# Patient Record
Sex: Female | Born: 1970 | Race: White | Hispanic: No | Marital: Single | Smoking: Current every day smoker
Health system: Southern US, Community
[De-identification: ages and names within clinical notes are randomized; demographics above are authoritative.]

## PROBLEM LIST (undated history)

## (undated) DIAGNOSIS — B20 Human immunodeficiency virus [HIV] disease: Secondary | ICD-10-CM

## (undated) DIAGNOSIS — IMO0002 Reserved for concepts with insufficient information to code with codable children: Secondary | ICD-10-CM

## (undated) DIAGNOSIS — R911 Solitary pulmonary nodule: Secondary | ICD-10-CM

## (undated) DIAGNOSIS — T7840XA Allergy, unspecified, initial encounter: Secondary | ICD-10-CM

## (undated) DIAGNOSIS — Z21 Asymptomatic human immunodeficiency virus [HIV] infection status: Secondary | ICD-10-CM

## (undated) DIAGNOSIS — F419 Anxiety disorder, unspecified: Secondary | ICD-10-CM

## (undated) DIAGNOSIS — R87619 Unspecified abnormal cytological findings in specimens from cervix uteri: Secondary | ICD-10-CM

## (undated) DIAGNOSIS — G44009 Cluster headache syndrome, unspecified, not intractable: Secondary | ICD-10-CM

## (undated) DIAGNOSIS — I10 Essential (primary) hypertension: Secondary | ICD-10-CM

## (undated) DIAGNOSIS — A630 Anogenital (venereal) warts: Secondary | ICD-10-CM

## (undated) DIAGNOSIS — D649 Anemia, unspecified: Secondary | ICD-10-CM

## (undated) HISTORY — DX: Anemia, unspecified: D64.9

## (undated) HISTORY — PX: KNEE ARTHROSCOPY: SUR90

## (undated) HISTORY — DX: Reserved for concepts with insufficient information to code with codable children: IMO0002

## (undated) HISTORY — DX: Anxiety disorder, unspecified: F41.9

## (undated) HISTORY — DX: Asymptomatic human immunodeficiency virus (hiv) infection status: Z21

## (undated) HISTORY — DX: Unspecified abnormal cytological findings in specimens from cervix uteri: R87.619

## (undated) HISTORY — PX: TUBAL LIGATION: SHX77

## (undated) HISTORY — DX: Allergy, unspecified, initial encounter: T78.40XA

## (undated) HISTORY — DX: Cluster headache syndrome, unspecified, not intractable: G44.009

## (undated) HISTORY — DX: Solitary pulmonary nodule: R91.1

## (undated) HISTORY — DX: Anogenital (venereal) warts: A63.0

## (undated) HISTORY — PX: ABDOMINAL HYSTERECTOMY: SHX81

## (undated) HISTORY — DX: Human immunodeficiency virus (HIV) disease: B20

---

## 2001-04-17 ENCOUNTER — Ambulatory Visit (HOSPITAL_COMMUNITY): Admission: RE | Admit: 2001-04-17 | Discharge: 2001-04-17 | Payer: Self-pay | Admitting: Obstetrics and Gynecology

## 2001-04-17 ENCOUNTER — Encounter: Payer: Self-pay | Admitting: Obstetrics and Gynecology

## 2001-07-03 ENCOUNTER — Ambulatory Visit (HOSPITAL_COMMUNITY): Admission: RE | Admit: 2001-07-03 | Discharge: 2001-07-03 | Payer: Self-pay | Admitting: Obstetrics and Gynecology

## 2001-07-27 ENCOUNTER — Encounter: Admission: RE | Admit: 2001-07-27 | Discharge: 2001-07-27 | Payer: Self-pay | Admitting: *Deleted

## 2001-08-17 ENCOUNTER — Encounter: Admission: RE | Admit: 2001-08-17 | Discharge: 2001-08-17 | Payer: Self-pay | Admitting: *Deleted

## 2001-08-23 ENCOUNTER — Encounter: Admission: RE | Admit: 2001-08-23 | Discharge: 2001-08-23 | Payer: Self-pay | Admitting: *Deleted

## 2001-08-30 ENCOUNTER — Ambulatory Visit (HOSPITAL_COMMUNITY): Admission: RE | Admit: 2001-08-30 | Discharge: 2001-08-30 | Payer: Self-pay | Admitting: *Deleted

## 2001-08-31 ENCOUNTER — Encounter: Admission: RE | Admit: 2001-08-31 | Discharge: 2001-08-31 | Payer: Self-pay | Admitting: *Deleted

## 2001-09-07 ENCOUNTER — Encounter: Admission: RE | Admit: 2001-09-07 | Discharge: 2001-09-07 | Payer: Self-pay | Admitting: *Deleted

## 2001-09-14 ENCOUNTER — Encounter: Admission: RE | Admit: 2001-09-14 | Discharge: 2001-09-14 | Payer: Self-pay | Admitting: *Deleted

## 2001-09-28 ENCOUNTER — Encounter (HOSPITAL_COMMUNITY): Admission: RE | Admit: 2001-09-28 | Discharge: 2001-10-01 | Payer: Self-pay | Admitting: *Deleted

## 2001-09-28 ENCOUNTER — Encounter: Admission: RE | Admit: 2001-09-28 | Discharge: 2001-09-28 | Payer: Self-pay | Admitting: *Deleted

## 2001-09-30 ENCOUNTER — Encounter (INDEPENDENT_AMBULATORY_CARE_PROVIDER_SITE_OTHER): Payer: Self-pay | Admitting: Specialist

## 2001-09-30 ENCOUNTER — Inpatient Hospital Stay (HOSPITAL_COMMUNITY): Admission: AD | Admit: 2001-09-30 | Discharge: 2001-10-03 | Payer: Self-pay | Admitting: Obstetrics and Gynecology

## 2001-10-17 ENCOUNTER — Inpatient Hospital Stay (HOSPITAL_COMMUNITY): Admission: AD | Admit: 2001-10-17 | Discharge: 2001-10-17 | Payer: Self-pay | Admitting: *Deleted

## 2003-03-20 ENCOUNTER — Encounter: Admission: RE | Admit: 2003-03-20 | Discharge: 2003-03-20 | Payer: Self-pay | Admitting: Infectious Diseases

## 2003-03-20 ENCOUNTER — Encounter (INDEPENDENT_AMBULATORY_CARE_PROVIDER_SITE_OTHER): Payer: Self-pay | Admitting: *Deleted

## 2003-03-20 ENCOUNTER — Ambulatory Visit (HOSPITAL_COMMUNITY): Admission: RE | Admit: 2003-03-20 | Discharge: 2003-03-20 | Payer: Self-pay | Admitting: Infectious Diseases

## 2003-03-20 ENCOUNTER — Encounter: Payer: Self-pay | Admitting: Infectious Diseases

## 2003-03-20 LAB — CONVERTED CEMR LAB
CD4 Count: 380 microliters
CD4 T Cell Abs: 380

## 2003-04-03 ENCOUNTER — Encounter: Admission: RE | Admit: 2003-04-03 | Discharge: 2003-04-03 | Payer: Self-pay | Admitting: Infectious Diseases

## 2003-05-29 ENCOUNTER — Encounter: Payer: Self-pay | Admitting: Infectious Diseases

## 2003-05-29 ENCOUNTER — Ambulatory Visit (HOSPITAL_COMMUNITY): Admission: RE | Admit: 2003-05-29 | Discharge: 2003-05-29 | Payer: Self-pay | Admitting: Infectious Diseases

## 2003-05-29 ENCOUNTER — Encounter: Admission: RE | Admit: 2003-05-29 | Discharge: 2003-05-29 | Payer: Self-pay | Admitting: Infectious Diseases

## 2003-06-19 ENCOUNTER — Encounter: Admission: RE | Admit: 2003-06-19 | Discharge: 2003-06-19 | Payer: Self-pay | Admitting: Infectious Diseases

## 2003-06-19 ENCOUNTER — Ambulatory Visit (HOSPITAL_COMMUNITY): Admission: RE | Admit: 2003-06-19 | Discharge: 2003-06-19 | Payer: Self-pay | Admitting: Infectious Diseases

## 2003-06-19 ENCOUNTER — Encounter (INDEPENDENT_AMBULATORY_CARE_PROVIDER_SITE_OTHER): Payer: Self-pay | Admitting: *Deleted

## 2003-10-22 ENCOUNTER — Encounter: Admission: RE | Admit: 2003-10-22 | Discharge: 2003-10-22 | Payer: Self-pay | Admitting: Infectious Diseases

## 2004-01-13 ENCOUNTER — Encounter: Admission: RE | Admit: 2004-01-13 | Discharge: 2004-01-13 | Payer: Self-pay | Admitting: Infectious Diseases

## 2004-04-03 ENCOUNTER — Ambulatory Visit: Payer: Self-pay | Admitting: Infectious Diseases

## 2004-05-22 ENCOUNTER — Ambulatory Visit: Payer: Self-pay | Admitting: Infectious Diseases

## 2004-05-22 ENCOUNTER — Ambulatory Visit (HOSPITAL_COMMUNITY): Admission: RE | Admit: 2004-05-22 | Discharge: 2004-05-22 | Payer: Self-pay | Admitting: Infectious Diseases

## 2004-08-19 ENCOUNTER — Ambulatory Visit: Payer: Self-pay | Admitting: Infectious Diseases

## 2004-08-19 ENCOUNTER — Ambulatory Visit (HOSPITAL_COMMUNITY): Admission: RE | Admit: 2004-08-19 | Discharge: 2004-08-19 | Payer: Self-pay | Admitting: Infectious Diseases

## 2004-11-18 ENCOUNTER — Ambulatory Visit: Payer: Self-pay | Admitting: Infectious Diseases

## 2005-01-13 ENCOUNTER — Ambulatory Visit: Payer: Self-pay | Admitting: Infectious Diseases

## 2005-01-13 ENCOUNTER — Ambulatory Visit (HOSPITAL_COMMUNITY): Admission: RE | Admit: 2005-01-13 | Discharge: 2005-01-13 | Payer: Self-pay | Admitting: Infectious Diseases

## 2005-02-19 ENCOUNTER — Encounter (INDEPENDENT_AMBULATORY_CARE_PROVIDER_SITE_OTHER): Payer: Self-pay | Admitting: *Deleted

## 2005-02-19 ENCOUNTER — Ambulatory Visit: Payer: Self-pay | Admitting: Family Medicine

## 2005-04-12 ENCOUNTER — Ambulatory Visit: Payer: Self-pay | Admitting: Infectious Diseases

## 2005-04-12 ENCOUNTER — Ambulatory Visit (HOSPITAL_COMMUNITY): Admission: RE | Admit: 2005-04-12 | Discharge: 2005-04-12 | Payer: Self-pay | Admitting: Infectious Diseases

## 2005-04-15 ENCOUNTER — Encounter (INDEPENDENT_AMBULATORY_CARE_PROVIDER_SITE_OTHER): Payer: Self-pay | Admitting: Specialist

## 2005-04-15 ENCOUNTER — Other Ambulatory Visit: Admission: RE | Admit: 2005-04-15 | Discharge: 2005-04-15 | Payer: Self-pay | Admitting: Family Medicine

## 2005-04-15 ENCOUNTER — Ambulatory Visit: Payer: Self-pay | Admitting: Family Medicine

## 2005-07-16 ENCOUNTER — Ambulatory Visit: Payer: Self-pay | Admitting: *Deleted

## 2005-07-16 ENCOUNTER — Other Ambulatory Visit: Admission: RE | Admit: 2005-07-16 | Discharge: 2005-07-16 | Payer: Self-pay | Admitting: Family Medicine

## 2005-07-26 ENCOUNTER — Ambulatory Visit: Payer: Self-pay | Admitting: Infectious Diseases

## 2005-08-06 ENCOUNTER — Ambulatory Visit: Payer: Self-pay | Admitting: Family Medicine

## 2005-08-20 ENCOUNTER — Ambulatory Visit (HOSPITAL_COMMUNITY): Admission: RE | Admit: 2005-08-20 | Discharge: 2005-08-20 | Payer: Self-pay | Admitting: Family Medicine

## 2005-09-24 ENCOUNTER — Ambulatory Visit: Payer: Self-pay | Admitting: Gynecology

## 2005-11-10 ENCOUNTER — Encounter (INDEPENDENT_AMBULATORY_CARE_PROVIDER_SITE_OTHER): Payer: Self-pay | Admitting: *Deleted

## 2005-11-10 ENCOUNTER — Encounter: Admission: RE | Admit: 2005-11-10 | Discharge: 2005-11-10 | Payer: Self-pay | Admitting: Infectious Diseases

## 2005-11-10 ENCOUNTER — Ambulatory Visit: Payer: Self-pay | Admitting: Infectious Diseases

## 2005-11-10 LAB — CONVERTED CEMR LAB
CD4 Count: 490 microliters
HIV 1 RNA Quant: 399 copies/mL

## 2005-12-02 ENCOUNTER — Ambulatory Visit: Payer: Self-pay | Admitting: Family Medicine

## 2005-12-20 ENCOUNTER — Encounter (INDEPENDENT_AMBULATORY_CARE_PROVIDER_SITE_OTHER): Payer: Self-pay | Admitting: *Deleted

## 2005-12-20 ENCOUNTER — Inpatient Hospital Stay (HOSPITAL_COMMUNITY): Admission: RE | Admit: 2005-12-20 | Discharge: 2005-12-22 | Payer: Self-pay | Admitting: Family Medicine

## 2005-12-20 ENCOUNTER — Ambulatory Visit: Payer: Self-pay | Admitting: Family Medicine

## 2005-12-20 DIAGNOSIS — Z90710 Acquired absence of both cervix and uterus: Secondary | ICD-10-CM | POA: Insufficient documentation

## 2006-01-13 ENCOUNTER — Ambulatory Visit: Payer: Self-pay | Admitting: Family Medicine

## 2006-02-10 ENCOUNTER — Ambulatory Visit: Payer: Self-pay | Admitting: Family Medicine

## 2006-02-16 ENCOUNTER — Encounter (INDEPENDENT_AMBULATORY_CARE_PROVIDER_SITE_OTHER): Payer: Self-pay | Admitting: *Deleted

## 2006-02-16 ENCOUNTER — Encounter: Admission: RE | Admit: 2006-02-16 | Discharge: 2006-02-16 | Payer: Self-pay | Admitting: Infectious Diseases

## 2006-02-16 ENCOUNTER — Ambulatory Visit: Payer: Self-pay | Admitting: Infectious Diseases

## 2006-02-16 LAB — CONVERTED CEMR LAB: HIV 1 RNA Quant: 49 copies/mL

## 2006-04-04 ENCOUNTER — Ambulatory Visit: Payer: Self-pay | Admitting: Infectious Diseases

## 2006-06-27 ENCOUNTER — Ambulatory Visit: Payer: Self-pay | Admitting: Infectious Diseases

## 2006-06-27 ENCOUNTER — Encounter (INDEPENDENT_AMBULATORY_CARE_PROVIDER_SITE_OTHER): Payer: Self-pay | Admitting: *Deleted

## 2006-06-27 ENCOUNTER — Encounter: Admission: RE | Admit: 2006-06-27 | Discharge: 2006-06-27 | Payer: Self-pay | Admitting: Infectious Diseases

## 2006-06-27 DIAGNOSIS — F17200 Nicotine dependence, unspecified, uncomplicated: Secondary | ICD-10-CM | POA: Insufficient documentation

## 2006-06-27 DIAGNOSIS — J019 Acute sinusitis, unspecified: Secondary | ICD-10-CM | POA: Insufficient documentation

## 2006-06-27 DIAGNOSIS — J329 Chronic sinusitis, unspecified: Secondary | ICD-10-CM | POA: Insufficient documentation

## 2006-06-27 DIAGNOSIS — B20 Human immunodeficiency virus [HIV] disease: Secondary | ICD-10-CM | POA: Insufficient documentation

## 2006-06-27 DIAGNOSIS — F411 Generalized anxiety disorder: Secondary | ICD-10-CM | POA: Insufficient documentation

## 2006-06-27 LAB — CONVERTED CEMR LAB
ALT: 17 units/L (ref 0–35)
AST: 18 units/L (ref 0–37)
Albumin: 4.7 g/dL (ref 3.5–5.2)
BUN: 7 mg/dL (ref 6–23)
Calcium: 9 mg/dL (ref 8.4–10.5)
Chloride: 108 meq/L (ref 96–112)
HIV 1 RNA Quant: 86 copies/mL — ABNORMAL HIGH (ref <50)
HIV-1 RNA Quant, Log: 1.93 — ABNORMAL HIGH (ref <1.70)
Hemoglobin: 12.7 g/dL (ref 12.0–15.0)
LDL Cholesterol: 114 mg/dL — ABNORMAL HIGH (ref 0–99)
Platelets: 217 10*3/uL (ref 150–400)
Potassium: 4 meq/L (ref 3.5–5.3)
RDW: 12.6 % (ref 11.5–14.0)
Sodium: 143 meq/L (ref 135–145)
Total Protein: 6.9 g/dL (ref 6.0–8.3)

## 2006-06-30 ENCOUNTER — Encounter: Payer: Self-pay | Admitting: Infectious Diseases

## 2006-07-26 ENCOUNTER — Encounter: Payer: Self-pay | Admitting: Infectious Diseases

## 2006-08-08 ENCOUNTER — Encounter (INDEPENDENT_AMBULATORY_CARE_PROVIDER_SITE_OTHER): Payer: Self-pay | Admitting: *Deleted

## 2006-08-08 LAB — CONVERTED CEMR LAB

## 2006-08-21 ENCOUNTER — Encounter (INDEPENDENT_AMBULATORY_CARE_PROVIDER_SITE_OTHER): Payer: Self-pay | Admitting: *Deleted

## 2006-09-26 ENCOUNTER — Telehealth: Payer: Self-pay | Admitting: Infectious Diseases

## 2006-10-03 ENCOUNTER — Telehealth: Payer: Self-pay | Admitting: Infectious Diseases

## 2006-10-24 ENCOUNTER — Ambulatory Visit: Payer: Self-pay | Admitting: Infectious Diseases

## 2006-10-24 ENCOUNTER — Encounter: Admission: RE | Admit: 2006-10-24 | Discharge: 2006-10-24 | Payer: Self-pay | Admitting: Infectious Diseases

## 2006-10-24 LAB — CONVERTED CEMR LAB
AST: 25 units/L (ref 0–37)
Albumin: 4.9 g/dL (ref 3.5–5.2)
Alkaline Phosphatase: 60 units/L (ref 39–117)
BUN: 14 mg/dL (ref 6–23)
Basophils Relative: 1 % (ref 0–1)
CD4 Count: 570 microliters
Eosinophils Absolute: 0.1 10*3/uL (ref 0.0–0.7)
Eosinophils Relative: 3 % (ref 0–5)
Glucose, Bld: 97 mg/dL (ref 70–99)
HCT: 39.1 % (ref 36.0–46.0)
Lymphs Abs: 1.4 10*3/uL (ref 0.7–3.3)
MCHC: 34.5 g/dL (ref 30.0–36.0)
MCV: 95.6 fL (ref 78.0–100.0)
Monocytes Relative: 7 % (ref 3–11)
Neutrophils Relative %: 54 % (ref 43–77)
Platelets: 143 10*3/uL — ABNORMAL LOW (ref 150–400)
Potassium: 3.9 meq/L (ref 3.5–5.3)
Sodium: 141 meq/L (ref 135–145)
Total Bilirubin: 0.4 mg/dL (ref 0.3–1.2)

## 2006-10-25 ENCOUNTER — Telehealth: Payer: Self-pay | Admitting: Infectious Diseases

## 2006-10-31 ENCOUNTER — Ambulatory Visit: Payer: Self-pay | Admitting: Infectious Diseases

## 2006-10-31 DIAGNOSIS — G4700 Insomnia, unspecified: Secondary | ICD-10-CM | POA: Insufficient documentation

## 2006-10-31 DIAGNOSIS — G43909 Migraine, unspecified, not intractable, without status migrainosus: Secondary | ICD-10-CM

## 2006-11-17 ENCOUNTER — Telehealth: Payer: Self-pay | Admitting: Infectious Diseases

## 2007-01-12 ENCOUNTER — Telehealth: Payer: Self-pay | Admitting: Infectious Diseases

## 2007-01-13 ENCOUNTER — Ambulatory Visit: Payer: Self-pay | Admitting: Gynecology

## 2007-04-03 ENCOUNTER — Ambulatory Visit: Payer: Self-pay | Admitting: Infectious Diseases

## 2007-04-03 ENCOUNTER — Encounter: Admission: RE | Admit: 2007-04-03 | Discharge: 2007-04-03 | Payer: Self-pay | Admitting: Infectious Diseases

## 2007-04-03 DIAGNOSIS — B351 Tinea unguium: Secondary | ICD-10-CM | POA: Insufficient documentation

## 2007-04-03 LAB — CONVERTED CEMR LAB
AST: 17 units/L (ref 0–37)
Albumin: 4.5 g/dL (ref 3.5–5.2)
Alkaline Phosphatase: 50 units/L (ref 39–117)
BUN: 9 mg/dL (ref 6–23)
Creatinine, Ser: 0.78 mg/dL (ref 0.40–1.20)
Glucose, Bld: 87 mg/dL (ref 70–99)
HCT: 38.1 % (ref 36.0–46.0)
HDL: 37 mg/dL — ABNORMAL LOW (ref >39)
Hemoglobin: 13 g/dL (ref 12.0–15.0)
LDL Cholesterol: 121 mg/dL — ABNORMAL HIGH (ref 0–99)
MCHC: 34.1 g/dL (ref 30.0–36.0)
MCV: 97.7 fL (ref 78.0–100.0)
Potassium: 3.8 meq/L (ref 3.5–5.3)
RBC: 3.9 M/uL (ref 3.87–5.11)
RDW: 12.8 % (ref 11.5–14.0)
Total CHOL/HDL Ratio: 5
Triglycerides: 142 mg/dL (ref <150)

## 2007-05-03 ENCOUNTER — Telehealth: Payer: Self-pay | Admitting: Infectious Diseases

## 2007-06-13 ENCOUNTER — Encounter: Payer: Self-pay | Admitting: Infectious Diseases

## 2007-06-22 ENCOUNTER — Telehealth: Payer: Self-pay | Admitting: Infectious Diseases

## 2007-07-05 ENCOUNTER — Telehealth: Payer: Self-pay | Admitting: Infectious Diseases

## 2007-08-04 ENCOUNTER — Ambulatory Visit: Payer: Self-pay | Admitting: Internal Medicine

## 2007-08-04 ENCOUNTER — Encounter: Admission: RE | Admit: 2007-08-04 | Discharge: 2007-08-04 | Payer: Self-pay | Admitting: Internal Medicine

## 2007-08-04 ENCOUNTER — Encounter: Payer: Self-pay | Admitting: Infectious Diseases

## 2007-08-04 LAB — CONVERTED CEMR LAB
AST: 21 units/L (ref 0–37)
Alkaline Phosphatase: 47 units/L (ref 39–117)
BUN: 6 mg/dL (ref 6–23)
Basophils Absolute: 0 10*3/uL (ref 0.0–0.1)
Basophils Relative: 1 % (ref 0–1)
Creatinine, Ser: 0.59 mg/dL (ref 0.40–1.20)
Eosinophils Absolute: 0.1 10*3/uL (ref 0.0–0.7)
HIV 1 RNA Quant: <50 {copies}/mL
HIV-1 RNA Quant, Log: <1.7
Hemoglobin: 13.2 g/dL (ref 12.0–15.0)
MCHC: 34.5 g/dL (ref 30.0–36.0)
MCV: 97.5 fL (ref 78.0–100.0)
Monocytes Absolute: 0.3 10*3/uL (ref 0.1–1.0)
Monocytes Relative: 6 % (ref 3–12)
Neutrophils Relative %: 58 % (ref 43–77)
RBC: 3.93 M/uL (ref 3.87–5.11)
RDW: 12.9 % (ref 11.5–15.5)

## 2007-08-23 ENCOUNTER — Ambulatory Visit: Payer: Self-pay | Admitting: Infectious Diseases

## 2007-09-29 ENCOUNTER — Telehealth: Payer: Self-pay | Admitting: Infectious Diseases

## 2007-10-16 ENCOUNTER — Telehealth: Payer: Self-pay | Admitting: Infectious Diseases

## 2007-10-18 ENCOUNTER — Ambulatory Visit: Payer: Self-pay | Admitting: Internal Medicine

## 2007-10-18 DIAGNOSIS — L02818 Cutaneous abscess of other sites: Secondary | ICD-10-CM

## 2007-10-18 DIAGNOSIS — L282 Other prurigo: Secondary | ICD-10-CM | POA: Insufficient documentation

## 2007-10-18 DIAGNOSIS — L03818 Cellulitis of other sites: Secondary | ICD-10-CM

## 2007-10-18 DIAGNOSIS — R21 Rash and other nonspecific skin eruption: Secondary | ICD-10-CM

## 2007-10-18 DIAGNOSIS — L659 Nonscarring hair loss, unspecified: Secondary | ICD-10-CM | POA: Insufficient documentation

## 2007-10-25 ENCOUNTER — Telehealth: Payer: Self-pay | Admitting: Internal Medicine

## 2007-11-28 ENCOUNTER — Ambulatory Visit: Payer: Self-pay | Admitting: Infectious Diseases

## 2007-11-28 ENCOUNTER — Encounter: Admission: RE | Admit: 2007-11-28 | Discharge: 2007-11-28 | Payer: Self-pay | Admitting: Infectious Diseases

## 2007-11-28 LAB — CONVERTED CEMR LAB
CO2: 23 meq/L (ref 19–32)
Eosinophils Relative: 3 % (ref 0–5)
Glucose, Bld: 96 mg/dL (ref 70–99)
HCT: 38.9 % (ref 36.0–46.0)
HIV 1 RNA Quant: 50 copies/mL — ABNORMAL HIGH (ref <50)
HIV-1 RNA Quant, Log: 1.7 — ABNORMAL HIGH (ref <1.70)
Hemoglobin: 13.2 g/dL (ref 12.0–15.0)
Lymphocytes Relative: 37 % (ref 12–46)
Lymphs Abs: 2.4 10*3/uL (ref 0.7–4.0)
Monocytes Absolute: 0.3 10*3/uL (ref 0.1–1.0)
Monocytes Relative: 5 % (ref 3–12)
Platelets: 208 10*3/uL (ref 150–400)
RBC: 4.06 M/uL (ref 3.87–5.11)
Sodium: 141 meq/L (ref 135–145)
Total Bilirubin: 0.2 mg/dL — ABNORMAL LOW (ref 0.3–1.2)
Total Protein: 6.8 g/dL (ref 6.0–8.3)
WBC: 6.3 10*3/uL (ref 4.0–10.5)

## 2007-12-13 ENCOUNTER — Ambulatory Visit: Payer: Self-pay | Admitting: Infectious Diseases

## 2007-12-14 ENCOUNTER — Telehealth: Payer: Self-pay | Admitting: Infectious Diseases

## 2007-12-19 ENCOUNTER — Telehealth: Payer: Self-pay | Admitting: Infectious Diseases

## 2008-02-20 ENCOUNTER — Telehealth: Payer: Self-pay

## 2008-02-21 ENCOUNTER — Ambulatory Visit: Payer: Self-pay | Admitting: Infectious Diseases

## 2008-02-21 ENCOUNTER — Encounter: Payer: Self-pay | Admitting: Infectious Diseases

## 2008-02-21 DIAGNOSIS — M25569 Pain in unspecified knee: Secondary | ICD-10-CM

## 2008-02-23 ENCOUNTER — Ambulatory Visit: Admission: RE | Admit: 2008-02-23 | Discharge: 2008-02-23 | Payer: Self-pay | Admitting: Infectious Diseases

## 2008-02-23 ENCOUNTER — Ambulatory Visit: Payer: Self-pay | Admitting: Vascular Surgery

## 2008-02-23 ENCOUNTER — Encounter: Payer: Self-pay | Admitting: Infectious Diseases

## 2008-03-11 ENCOUNTER — Telehealth: Payer: Self-pay | Admitting: Infectious Diseases

## 2008-03-15 ENCOUNTER — Ambulatory Visit: Payer: Self-pay | Admitting: Internal Medicine

## 2008-03-27 ENCOUNTER — Encounter (HOSPITAL_COMMUNITY): Admission: RE | Admit: 2008-03-27 | Discharge: 2008-04-26 | Payer: Self-pay | Admitting: *Deleted

## 2008-04-19 ENCOUNTER — Ambulatory Visit (HOSPITAL_COMMUNITY): Admission: RE | Admit: 2008-04-19 | Discharge: 2008-04-19 | Payer: Self-pay | Admitting: Infectious Diseases

## 2008-04-19 ENCOUNTER — Encounter: Payer: Self-pay | Admitting: Obstetrics & Gynecology

## 2008-04-19 ENCOUNTER — Ambulatory Visit: Payer: Self-pay | Admitting: Obstetrics & Gynecology

## 2008-04-22 ENCOUNTER — Telehealth: Payer: Self-pay | Admitting: Infectious Diseases

## 2008-05-06 ENCOUNTER — Telehealth: Payer: Self-pay | Admitting: Infectious Diseases

## 2008-06-04 ENCOUNTER — Ambulatory Visit: Payer: Self-pay | Admitting: Infectious Diseases

## 2008-06-04 LAB — CONVERTED CEMR LAB
ALT: 14 units/L (ref 0–35)
Albumin: 4.4 g/dL (ref 3.5–5.2)
Basophils Absolute: 0 10*3/uL (ref 0.0–0.1)
CO2: 24 meq/L (ref 19–32)
Calcium: 8.8 mg/dL (ref 8.4–10.5)
Chloride: 108 meq/L (ref 96–112)
HIV 1 RNA Quant: 192 copies/mL — ABNORMAL HIGH (ref <48)
Hemoglobin: 13.4 g/dL (ref 12.0–15.0)
Lymphocytes Relative: 22 % (ref 12–46)
Monocytes Absolute: 0.5 10*3/uL (ref 0.1–1.0)
Neutro Abs: 5.9 10*3/uL (ref 1.7–7.7)
Platelets: 222 10*3/uL (ref 150–400)
Potassium: 3.6 meq/L (ref 3.5–5.3)
RDW: 12.8 % (ref 11.5–15.5)
Sodium: 142 meq/L (ref 135–145)
Total Protein: 6.5 g/dL (ref 6.0–8.3)

## 2008-06-19 ENCOUNTER — Ambulatory Visit: Payer: Self-pay | Admitting: Obstetrics and Gynecology

## 2008-06-19 ENCOUNTER — Ambulatory Visit: Payer: Self-pay | Admitting: Infectious Diseases

## 2008-06-19 DIAGNOSIS — M25559 Pain in unspecified hip: Secondary | ICD-10-CM | POA: Insufficient documentation

## 2008-06-26 ENCOUNTER — Ambulatory Visit (HOSPITAL_COMMUNITY): Admission: RE | Admit: 2008-06-26 | Discharge: 2008-06-26 | Payer: Self-pay | Admitting: Infectious Diseases

## 2008-06-27 ENCOUNTER — Encounter (INDEPENDENT_AMBULATORY_CARE_PROVIDER_SITE_OTHER): Payer: Self-pay | Admitting: Licensed Clinical Social Worker

## 2008-06-27 ENCOUNTER — Telehealth (INDEPENDENT_AMBULATORY_CARE_PROVIDER_SITE_OTHER): Payer: Self-pay | Admitting: *Deleted

## 2008-06-28 ENCOUNTER — Encounter (INDEPENDENT_AMBULATORY_CARE_PROVIDER_SITE_OTHER): Payer: Self-pay | Admitting: *Deleted

## 2008-07-05 ENCOUNTER — Encounter: Payer: Self-pay | Admitting: Infectious Diseases

## 2008-07-05 ENCOUNTER — Telehealth: Payer: Self-pay | Admitting: Infectious Diseases

## 2008-07-16 ENCOUNTER — Telehealth: Payer: Self-pay | Admitting: Infectious Diseases

## 2008-10-04 ENCOUNTER — Encounter: Payer: Self-pay | Admitting: Infectious Diseases

## 2008-11-13 ENCOUNTER — Ambulatory Visit: Payer: Self-pay | Admitting: Infectious Diseases

## 2008-11-13 LAB — CONVERTED CEMR LAB
AST: 21 units/L (ref 0–37)
Albumin: 4.7 g/dL (ref 3.5–5.2)
BUN: 13 mg/dL (ref 6–23)
Basophils Absolute: 0 10*3/uL (ref 0.0–0.1)
Calcium: 9.3 mg/dL (ref 8.4–10.5)
Chloride: 106 meq/L (ref 96–112)
Eosinophils Absolute: 0.1 10*3/uL (ref 0.0–0.7)
GFR calc non Af Amer: >60 mL/min (ref >60)
Glucose, Bld: 103 mg/dL — ABNORMAL HIGH (ref 70–99)
HDL: 35 mg/dL — ABNORMAL LOW (ref >39)
HIV 1 RNA Quant: <48 copies/mL (ref <48)
HIV-1 RNA Quant, Log: <1.68 (ref <1.68)
Lymphs Abs: 1.9 10*3/uL (ref 0.7–4.0)
MCV: 94.7 fL (ref 78.0–100.0)
Neutrophils Relative %: 60 % (ref 43–77)
Platelets: 175 10*3/uL (ref 150–400)
Potassium: 3.9 meq/L (ref 3.5–5.3)
RDW: 12.6 % (ref 11.5–15.5)
WBC: 6.2 10*3/uL (ref 4.0–10.5)

## 2008-11-27 ENCOUNTER — Ambulatory Visit: Payer: Self-pay | Admitting: Infectious Diseases

## 2009-04-28 ENCOUNTER — Ambulatory Visit: Payer: Self-pay | Admitting: Infectious Diseases

## 2009-04-28 ENCOUNTER — Telehealth: Payer: Self-pay | Admitting: Infectious Diseases

## 2009-04-28 LAB — CONVERTED CEMR LAB
BUN: 12 mg/dL (ref 6–23)
Calcium: 9.2 mg/dL (ref 8.4–10.5)
Creatinine, Ser: 0.7 mg/dL (ref 0.40–1.20)
Eosinophils Relative: 2 % (ref 0–5)
Glucose, Bld: 79 mg/dL (ref 70–99)
HCT: 39.4 % (ref 36.0–46.0)
HIV 1 RNA Quant: <48 copies/mL (ref <48)
HIV-1 RNA Quant, Log: <1.68 (ref <1.68)
Hemoglobin: 13.3 g/dL (ref 12.0–15.0)
Lymphocytes Relative: 35 % (ref 12–46)
Lymphs Abs: 1.9 10*3/uL (ref 0.7–4.0)
Monocytes Absolute: 0.3 10*3/uL (ref 0.1–1.0)
Monocytes Relative: 5 % (ref 3–12)
Potassium: 3.6 meq/L (ref 3.5–5.3)
RBC: 4.02 M/uL (ref 3.87–5.11)
Sodium: 143 meq/L (ref 135–145)
WBC: 5.4 10*3/uL (ref 4.0–10.5)

## 2009-04-30 ENCOUNTER — Telehealth: Payer: Self-pay | Admitting: Infectious Diseases

## 2009-05-12 ENCOUNTER — Ambulatory Visit: Payer: Self-pay | Admitting: Infectious Diseases

## 2009-06-17 ENCOUNTER — Telehealth: Payer: Self-pay | Admitting: Infectious Diseases

## 2009-09-08 ENCOUNTER — Ambulatory Visit: Payer: Self-pay | Admitting: Infectious Diseases

## 2009-09-08 LAB — CONVERTED CEMR LAB
ALT: 14 units/L (ref 0–35)
AST: 17 units/L (ref 0–37)
Albumin: 4.7 g/dL (ref 3.5–5.2)
Alkaline Phosphatase: 55 units/L (ref 39–117)
Basophils Absolute: 0 10*3/uL (ref 0.0–0.1)
Glucose, Bld: 71 mg/dL (ref 70–99)
HIV 1 RNA Quant: 141 copies/mL — ABNORMAL HIGH (ref <48)
HIV-1 RNA Quant, Log: 2.15 — ABNORMAL HIGH (ref <1.68)
Hemoglobin: 13.7 g/dL (ref 12.0–15.0)
LDL Cholesterol: 127 mg/dL — ABNORMAL HIGH (ref 0–99)
Lymphocytes Relative: 37 % (ref 12–46)
Monocytes Absolute: 0.3 10*3/uL (ref 0.1–1.0)
Neutro Abs: 2.4 10*3/uL (ref 1.7–7.7)
Potassium: 3.8 meq/L (ref 3.5–5.3)
RBC: 4.12 M/uL (ref 3.87–5.11)
RDW: 12.7 % (ref 11.5–15.5)
Sodium: 141 meq/L (ref 135–145)
Total Protein: 6.5 g/dL (ref 6.0–8.3)

## 2009-09-11 ENCOUNTER — Telehealth: Payer: Self-pay | Admitting: Infectious Diseases

## 2009-09-24 ENCOUNTER — Ambulatory Visit: Payer: Self-pay | Admitting: Infectious Diseases

## 2010-03-19 ENCOUNTER — Telehealth: Payer: Self-pay | Admitting: Internal Medicine

## 2010-03-24 ENCOUNTER — Telehealth: Payer: Self-pay | Admitting: Internal Medicine

## 2010-04-20 ENCOUNTER — Ambulatory Visit: Payer: Self-pay | Admitting: Infectious Diseases

## 2010-04-20 LAB — CONVERTED CEMR LAB
ALT: 15 units/L (ref 0–35)
Albumin: 4.9 g/dL (ref 3.5–5.2)
Alkaline Phosphatase: 64 units/L (ref 39–117)
Basophils Absolute: 0 10*3/uL (ref 0.0–0.1)
CO2: 28 meq/L (ref 19–32)
Glucose, Bld: 98 mg/dL (ref 70–99)
HIV 1 RNA Quant: <20 copies/mL (ref <20)
HIV-1 RNA Quant, Log: <1.3 (ref <1.30)
Lymphocytes Relative: 38 % (ref 12–46)
Lymphs Abs: 2.1 10*3/uL (ref 0.7–4.0)
Neutrophils Relative %: 54 % (ref 43–77)
Platelets: 202 10*3/uL (ref 150–400)
Potassium: 4.4 meq/L (ref 3.5–5.3)
Sodium: 143 meq/L (ref 135–145)
Total Protein: 7 g/dL (ref 6.0–8.3)
WBC: 5.4 10*3/uL (ref 4.0–10.5)

## 2010-04-23 ENCOUNTER — Telehealth (INDEPENDENT_AMBULATORY_CARE_PROVIDER_SITE_OTHER): Payer: Self-pay | Admitting: *Deleted

## 2010-06-17 ENCOUNTER — Ambulatory Visit
Admission: RE | Admit: 2010-06-17 | Discharge: 2010-06-17 | Payer: Self-pay | Source: Home / Self Care | Attending: Infectious Diseases | Admitting: Infectious Diseases

## 2010-06-17 DIAGNOSIS — A630 Anogenital (venereal) warts: Secondary | ICD-10-CM | POA: Insufficient documentation

## 2010-07-14 NOTE — Progress Notes (Signed)
Summary: no allergy to pcn  Phone Note Outgoing Call   Summary of Call: Patient requested Augmentin and she has a pcn allergy listed on the chart and it was denied by Dr. Philipp Deputy. The patient has had Augmentin on several occassions prescribed by Dr. [name]. I called the patient to see what her reaction was to pcn and she said she was told that when she was [age] she had a rash by her mother. I then spoke with Traci Sermon, NP and he gave the ok to refill the Augmentin. I will take take the pcn allergy off her list. Initial call taken by: Starleen Arms CMA,  March 24, 2010 3:08 PM

## 2010-07-14 NOTE — Assessment & Plan Note (Signed)
Summary: 4 month check up/cfb   CC:  4 month follow up.  History of Present Illness: 40 yo F with HIV+, anxiety and previous cocaine abuse ( none since April 09).  CD4 450 and VL 141 (09-08-09).  without complaints today.  cont to have headache-sinus/tension.  Seen by knee doctor- patellofemoral syndrome. was sent to rehab but still has pain and would like her cyst drained.  has noted rash over areas where she has a bruise. it will persist for a month after she has the bruise.  has been on nicotine patch with good result but when she was off patch she restarted smoking.   Preventive Screening-Counseling & Management  Alcohol-Tobacco     Alcohol drinks/day: occassionally     Alcohol type: mixed drinks     Smoking Status: current     Smoking Cessation Counseling: yes     Packs/Day: 1.0  Caffeine-Diet-Exercise     Caffeine use/day: coffee and sodas every day     Does Patient Exercise: no  Safety-Violence-Falls     Seat Belt Use: yes   Current Allergies (reviewed today): ! PCN ! SULFA Past History:  Past medical, surgical, family and social histories (including risk factors) reviewed, and no changes noted (except as noted below).  Past Medical History: HIV disease hysterectomy but ovaries intact.  Headaches Baker's Cyst  Current Medications (verified): 1)  Atripla 600-200-300 Mg Tabs (Efavirenz-Emtricitab-Tenofovir) .... One At Bedtime 2)  Xanax 2 Mg Tabs (Alprazolam) .... One Tab By Mouth Two Times A Day 3)  Ibuprofen 800 Mg Tabs (Ibuprofen) .... Take 1 Tablet By Mouth Twice Daily As Needed With Food. 4)  Tylenol/codeine #3 300-30 Mg  Tabs (Acetaminophen-Codeine) .Marland Kitchen.. 1po Q8hours As Needed 5)  Imitrex 50 Mg Tabs (Sumatriptan Succinate) .Marland Kitchen.. 1 Tab As Needed, May Take 2nd Dose 2 Hours Later. No More Than 2 Every 24 Hours.  Allergies (verified): 1)  ! Pcn 2)  ! Sulfa   Family History: Reviewed history from 12/13/2007 and no changes required. Family History Lung  cancer drug addiction, heart disease, bipolar (brother).   Social History: Reviewed history from 05/12/2009 and no changes required. Single.  Current Smoker Alcohol use-no Drug use-yes, cocaine bingeapr 09, smokes marijuana.  on disability.   Vital Signs:  Patient profile:   40 year old female Height:      64 inches (162.56 cm) Weight:      185.8 pounds (84.45 kg) BMI:     32.01 Temp:     98.2 degrees F (36.78 degrees C) oral Pulse rate:   63 / minute BP sitting:   130 / 90  (right arm)  Vitals Entered By: Baxter Hire) (September 24, 2009 10:01 AM) CC: 4 month follow up Is Patient Diabetic? No Pain Assessment Patient in pain? no      Nutritional Status BMI of > 30 = obese Nutritional Status Detail appetite is okay per patient  Have you ever been in a relationship where you felt threatened, hurt or afraid?No   Does patient need assistance? Functional Status Self care Ambulation Normal        Medication Adherence: 09/24/2009   Adherence to medications reviewed with patient. Counseling to provide adequate adherence provided   Prevention For Positives: 09/24/2009   Safe sex practices discussed with patient. Condoms offered.                             Physical Exam  General:  well-developed, well-nourished, and well-hydrated.   Eyes:  pupils equal, pupils round, and pupils reactive to light.   Mouth:  pharynx pink and moist and no exudates.   Neck:  no masses.   Lungs:  normal respiratory effort and normal breath sounds.   Heart:  normal rate, regular rhythm, and no murmur.   Abdomen:  soft, non-tender, and normal bowel sounds.     Impression & Recommendations:  Problem # 1:  HIV DISEASE (ICD-042) doing well. offered condoms. taking meds well. she will make appt for her Gyn.   Problem # 2:  TOBACCO ABUSE (ICD-305.1) reinforced smoking cessation.  The following medications were removed from the medication list:    Nicoderm Cq 7 Mg/24hr Pt24  (Nicotine) .Marland Kitchen... Apply to skin once daily  Problem # 3:  KNEE PAIN, ACUTE (ICD-719.46) will re-refer her to ortho for drainage of bakers cyst.  Her updated medication list for this problem includes:    Ibuprofen 800 Mg Tabs (Ibuprofen) .Marland Kitchen... Take 1 tablet by mouth twice daily as needed with food.    Tylenol/codeine #3 300-30 Mg Tabs (Acetaminophen-codeine) .Marland Kitchen... 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 cautioned her against long term use of these products.   Her updated medication list for this problem includes:    Phenylephrine Hcl 10 Mg Tabs (Phenylephrine hcl) .Marland Kitchen... As needed  Medications Added to Medication List This Visit: 1)  Phenylephrine Hcl 10 Mg Tabs (Phenylephrine hcl) .... As needed 2)  Chlor-trimeton 4 Mg Tabs (Chlorpheniramine maleate) .... As needed  Other Orders: Orthopedic Surgeon Referral (Ortho Surgeon) Est. Patient Level IV [id number]) Future Orders: T-CD4SP ([facility]) (CD4SP) ... 12/23/2009 T-HIV Viral Load [id number]) ... 12/23/2009 T-Comprehensive Metabolic Panel [id number]) ... 12/23/2009 T-CBC w/Diff ([id number]) ... 12/23/2009  Process Orders Check Orders Results:     [facility]: Check successful Tests Sent for requisitioning (September 24, 2009 10:38 AM):     12/23/2009: [facility] -- T-HIV Viral Load [id number] (signed)     12/23/2009: [facility] -- T-Comprehensive Metabolic Panel [[id number]] (signed)     12/23/2009: [facility] -- [facility] w/Diff [[id number]] (signed)

## 2010-07-14 NOTE — Progress Notes (Signed)
  Phone Note Call from Patient   Action Taken: Appt Scheduled Today Summary of Call: Patient has sinus headache, bad smells, and bad taste in the mouth. Would like Augmentin called to [facility]. She states that Dr. [name] usually writed this for her when she has symptoms. Initial call taken by: Starleen Arms CMA,  March 19, 2010 11:20 AM  Follow-up for Phone Call        according to her allergy list she is allergic to PCN so not able to prescribe augmentin Follow-up by: Yisroel Ramming MD,  March 23, 2010 10:35 AM    New/Updated Medications: AUGMENTIN 875-125 MG TABS (AMOXICILLIN-POT CLAVULANATE) 1 tablet two times a day for 10 days Prescriptions: AUGMENTIN 875-125 MG TABS (AMOXICILLIN-POT CLAVULANATE) 1 tablet two times a day for 10 days  #20 x 0   Entered by:   Starleen Arms CMA   Authorized by:   Yisroel Ramming MD   Signed by:   Starleen Arms CMA on 03/24/2010   Method used:   Electronically to        [facility]  [address] (retail)       [address]       [place], [place]  [address]       Ph: [phone]       Fax: [phone]   RxID:   [id number]

## 2010-07-14 NOTE — Progress Notes (Signed)
Summary: dose clarification  Phone Note From Pharmacy   Caller: [facility]  [address] Summary of Call: Received a request from [facility] [address] from patient that she would like to have the Nicoderm 7mg  patches instead of the Nicoderm 21 mg patches.  Please clarify which one you would like for her to have along with instructions and quantity.  Thanks.  The Nicoderm 21mg  is listed in the EMR. Initial call taken by: Paulo Fruit  BS,CPht II,MPH,  September 11, 2009 2:21 PM  Follow-up for Phone Call        thanks    New/Updated Medications: NICODERM CQ 7 MG/24HR PT24 (NICOTINE) apply to skin once daily Prescriptions: NICODERM CQ 7 MG/24HR PT24 (NICOTINE) apply to skin once daily  #42 x 1   Entered and Authorized by:   [name] MD   Signed by:   [name] MD on 09/11/2009   Method used:   Electronically to        [facility]  [address] (retail)       [address]       [place], [place]  [address]       Ph: [phone]       Fax: [phone]   RxID:   [id number]

## 2010-07-14 NOTE — Progress Notes (Signed)
Summary: office note faxed to surgeon  Phone Note Outgoing Call   Call placed by: Annice Pih Summary of Call: Office note and labs faxed to [facility]. surgeon, Dr. Magdalene Patricia at [phone] Initial call taken by: Wendall Mola CMA Duncan Dull),  April 23, 2010 12:52 PM

## 2010-07-14 NOTE — Progress Notes (Signed)
Summary: Req for nicotine patches   Phone Note Call from Patient   Summary of Call: She would like to try to quit smoking again.  Requesting nicotine patches.  [facility] [place]  Initial call taken by: Tomasita Morrow RN,  June 17, 2009 11:44 AM  Follow-up for Phone Call        done, thanks    New/Updated Medications: NICODERM CQ 21 MG/24HR PT24 (NICOTINE) apply to skin once daily for 6wks, then 14mg  patch once daily for 2 weeks Prescriptions: NICODERM CQ 21 MG/24HR PT24 (NICOTINE) apply to skin once daily for 6wks, then 14mg  patch once daily for 2 weeks  #42 x 0   Entered and Authorized by:   [name] MD   Signed by:   [name] MD on 06/17/2009   Method used:   Electronically to        [facility]  [address] (retail)       [address]       [place], [place]  [address]       Ph: [phone]       Fax: [phone]   RxID:   [id number]

## 2010-07-16 NOTE — Assessment & Plan Note (Signed)
Summary: 2wk f/u/vs   CC:  f/u ov    no missed doses of HAART .  History of Present Illness: 40 yo F with HIV+, anxiety and previous cocaine abuse ( none since April 09).  CD4 450 ---> 620 and VL und (05-10-10). Had knee surgery 04-29-10 to remove a bakers cyst. It went very smoothly for her. Medicine going well. Occas misses when she has a severe headache (less than 1x/month). Has not taken imitrex for many years. States her headache feel more like sinus headache. Describes as band around her head. still smoking.  Wants info on warts- flat fleshy spots.  wants Rx for lamisil for her foot fungus.   Preventive Screening-Counseling & Management  Alcohol-Tobacco     Alcohol drinks/day: occassionally     Alcohol type: mixed drinks     Smoking Status: current     Smoking Cessation Counseling: yes     Packs/Day: 1.0  Caffeine-Diet-Exercise     Caffeine use/day: coffee and sodas every day     Does Patient Exercise: no   Updated Prior Medication List: ATRIPLA 600-200-300 MG TABS (EFAVIRENZ-EMTRICITAB-TENOFOVIR) one at bedtime XANAX 2 MG TABS (ALPRAZOLAM) one tab by mouth two times a day IBUPROFEN 800 MG TABS (IBUPROFEN) Take 1 tablet by mouth twice daily as needed with food. TYLENOL/CODEINE #3 300-30 MG  TABS (ACETAMINOPHEN-CODEINE) 1po q8hours as needed IMITREX 50 MG TABS (SUMATRIPTAN SUCCINATE) 1 tab as needed, may take 2nd dose 2 hours later. no more than 2 every 24 hours.  Current Allergies: ! SULFA Past History:  Past medical, surgical, family and social histories (including risk factors) reviewed, and no changes noted (except as noted below).  Past Medical History: HIV disease hysterectomy but ovaries intact.  Headaches Baker's Cyst removed 04-29-10 Genital Warts  Family History: Reviewed history from 12/13/2007 and no changes required. Family History Lung cancer drug addiction, heart disease, bipolar (brother).   Social History: Reviewed history from 05/12/2009 and no  changes required. Single. currently having affair with married man.  Current Smoker Alcohol use-no Drug use-yes, cocaine binge Apr 09, smokes marijuana.  on disability.   Vital Signs:  Patient profile:   40 year old female Height:      64 inches Weight:      182.2 pounds BMI:     31.39 BSA:     1.88 Temp:     98.6 degrees F oral Pulse rate:   59 / minute BP sitting:   137 / 92  (right arm)  Vitals Entered By: Tomasita Morrow RN (June 17, 2010 9:55 AM) CC: f/u ov    no missed doses of HAART  Is Patient Diabetic? No Nutritional Status BMI of > 30 = obese Nutritional Status Detail normal  Have you ever been in a relationship where you felt threatened, hurt or afraid?No  Domestic Violence Intervention none  Does patient need assistance? Functional Status Self care Ambulation Normal   Physical Exam  General:  well-developed, well-nourished, and well-hydrated.   Eyes:  pupils equal, pupils round, and pupils reactive to light.   Mouth:  good dentition, pharynx pink and moist, and no exudates.   Neck:  no masses.   Lungs:  normal respiratory effort and normal breath sounds.   Heart:  normal rate, regular rhythm, and no murmur.   Abdomen:  soft, non-tender, and normal bowel sounds.   Extremities:  wound in L popliteal fossa- well healed.NT        Medication Adherence: 06/17/2010   Adherence to  medications reviewed with patient. Counseling to provide adequate adherence provided   Prevention For Positives: 06/17/2010   Safe sex practices discussed with patient. Condoms offered.                             Impression & Recommendations:  Problem # 1:  HIV DISEASE (ICD-042) offered condoms. discussed transmission risks with her (low with CD4 high and VL low). Tylenol 3 refilled. will see her back in 5-6 months with labs prior.   Her updated medication list for this problem includes:    Augmentin 875-125 Mg Tabs (Amoxicillin-pot clavulanate) .Marland Kitchen... 1 tablet two times a  day for 10 days    Lamisil 250 Mg Tabs (Terbinafine hcl) .Marland Kitchen... Take 1 tablet by mouth once a day  Problem # 2:  TOBACCO ABUSE (ICD-305.1) encouraged to stop.   Problem # 3:  CONDYLOMA ACUMINATUM (HYQ-657.84) she will f/u with her GYN.   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  Her updated medication list for this problem includes:    Lamisil 250 Mg Tabs (Terbinafine hcl) .Marland Kitchen... Take 1 tablet by mouth once a day  Medications Added to Medication List This Visit: 1)  Tylenol/codeine #3 300-30 Mg Tabs (Acetaminophen-codeine) .Marland Kitchen.. 1po q8hours as needed for pain 2)  Lamisil 250 Mg Tabs (Terbinafine hcl) .... Take 1 tablet by mouth once a day  Other Orders: Influenza Vaccine MCR (69629) Est. Patient Level IV ([address]) Future Orders: T-CD4SP ([facility]) (CD4SP) ... 12/14/2010 T-HIV Viral Load [id number]) ... 12/14/2010 T-Comprehensive Metabolic Panel [id number]) ... 12/14/2010 T-CBC w/Diff ([id number]) ... 12/14/2010 T-RPR (Syphilis) [id number]) ... 12/14/2010 T-Lipid Profile [id number]) ... 12/14/2010  Prescriptions: TYLENOL/CODEINE #3 300-30 MG  TABS (ACETAMINOPHEN-CODEINE) 1po q8hours as needed for pain  #30 x 0   Entered and Authorized by:   [name] MD   Signed by:   [name] MD on 06/17/2010   Method used:   Print then Give to Patient   RxID:   [id number] AUGMENTIN 875-125 MG TABS (AMOXICILLIN-POT CLAVULANATE) 1 tablet two times a day for 10 days  #20 x 2   Entered and Authorized by:   [name] MD   Signed by:   [name] MD on 06/17/2010   Method used:   Electronically to        [facility]  [address] [facility] ([facility]ail)       [address]       [place], [place]  [address]       Ph: [phone]       Fax: [phone]   RxID:   [id number] LAMISIL 250 MG TABS (TERBINAFINE HCL) Take 1 tablet by mouth once a day  #30 x 3   Entered and Authorized by:   [name] MD   Signed by:   [name] MD on 06/17/2010   Method used:   Electronically to        [facility]  [address] [facility] ([facility]ail)       [address]       [place], [place]  [address]       Ph: [phone]       Fax: [phone]   RxID:   [id number]         Medication Adherence: 06/17/2010   Adherence to medications reviewed with patient. Counseling to provide adequate adherence provided  Prevention For Positives: 06/17/2010   Safe sex practices discussed with patient. Condoms offered.                              Immunizations Administered:  Influenza Vaccine # 1:    Vaccine Type: Fluvax MCR    Site: left deltoid    Mfr: Novartis    Dose: 0.5 ml    Route: IM    Given by: Tomasita Morrow RN    Exp. Date: 10/10/2010    Lot #: [id number]    VIS given: 01/06/10 version given June 17, 2010.  Flu Vaccine Consent Questions:    Do you have a history of severe allergic reactions to this vaccine? no    Any prior history of allergic reactions to egg and/or gelatin? no    Do you have a sensitivity to the preservative Thimersol? no    Do you have a past history of Guillan-Barre Syndrome? no    Do you currently have an acute febrile illness? no    Have you ever had a severe reaction to latex? no    Vaccine information given and explained to patient? yes    Are you currently pregnant? no

## 2010-08-25 LAB — T-HELPER CELL (CD4) - (RCID CLINIC ONLY): CD4 % Helper T Cell: 32 % — ABNORMAL LOW (ref 33–55)

## 2010-09-06 LAB — T-HELPER CELL (CD4) - (RCID CLINIC ONLY): CD4 T Cell Abs: 450 uL (ref 400–2700)

## 2010-09-09 ENCOUNTER — Other Ambulatory Visit: Payer: Self-pay | Admitting: Infectious Diseases

## 2010-09-09 DIAGNOSIS — Z8669 Personal history of other diseases of the nervous system and sense organs: Secondary | ICD-10-CM

## 2010-09-16 LAB — T-HELPER CELL (CD4) - (RCID CLINIC ONLY)
CD4 % Helper T Cell: 34 % (ref 33–55)
CD4 T Cell Abs: 580 uL (ref 400–2700)

## 2010-09-24 ENCOUNTER — Other Ambulatory Visit: Payer: Self-pay | Admitting: Infectious Diseases

## 2010-09-24 DIAGNOSIS — F419 Anxiety disorder, unspecified: Secondary | ICD-10-CM

## 2010-09-28 ENCOUNTER — Other Ambulatory Visit: Payer: Self-pay | Admitting: Licensed Clinical Social Worker

## 2010-09-28 DIAGNOSIS — R52 Pain, unspecified: Secondary | ICD-10-CM

## 2010-09-28 DIAGNOSIS — Z8669 Personal history of other diseases of the nervous system and sense organs: Secondary | ICD-10-CM

## 2010-09-28 MED ORDER — ACETAMINOPHEN-CODEINE #3 300-30 MG PO TABS: 2.0000 | ORAL_TABLET | ORAL | Status: DC | PRN

## 2010-09-29 ENCOUNTER — Telehealth: Payer: Self-pay | Admitting: *Deleted

## 2010-09-29 NOTE — Telephone Encounter (Signed)
States she wants a refill on xanax. I lm that we are waiting to hear from md tomorrow & will let her know about the refill. Her cell is [phone] & alternate is [phone]. May leave a message with [name], her mom

## 2010-09-30 ENCOUNTER — Other Ambulatory Visit: Payer: Self-pay | Admitting: Infectious Diseases

## 2010-09-30 DIAGNOSIS — F419 Anxiety disorder, unspecified: Secondary | ICD-10-CM

## 2010-09-30 DIAGNOSIS — R52 Pain, unspecified: Secondary | ICD-10-CM

## 2010-09-30 MED ORDER — ALPRAZOLAM 2 MG PO TABS: 2.0000 mg | ORAL_TABLET | Freq: Two times a day (BID) | ORAL | Status: DC | PRN

## 2010-10-01 ENCOUNTER — Encounter: Payer: Self-pay | Admitting: Infectious Diseases

## 2010-10-01 ENCOUNTER — Telehealth: Payer: Self-pay | Admitting: *Deleted

## 2010-10-01 NOTE — Telephone Encounter (Signed)
rec'd electronic refill request for 800 mg ibuprofen. Not in Epic. It is in EMR. Will check to see if this has been d/c or not & then will fill if ok

## 2010-10-02 ENCOUNTER — Other Ambulatory Visit: Payer: Self-pay | Admitting: *Deleted

## 2010-10-02 DIAGNOSIS — G43909 Migraine, unspecified, not intractable, without status migrainosus: Secondary | ICD-10-CM

## 2010-10-02 MED ORDER — IBUPROFEN 800 MG PO TABS: 800.0000 mg | ORAL_TABLET | Freq: Two times a day (BID) | ORAL | Status: DC | PRN

## 2010-10-09 ENCOUNTER — Other Ambulatory Visit: Payer: Self-pay | Admitting: Infectious Diseases

## 2010-10-12 ENCOUNTER — Other Ambulatory Visit: Payer: Self-pay | Admitting: *Deleted

## 2010-10-12 DIAGNOSIS — Z8669 Personal history of other diseases of the nervous system and sense organs: Secondary | ICD-10-CM

## 2010-10-12 MED ORDER — ACETAMINOPHEN-CODEINE #3 300-30 MG PO TABS: 2.0000 | ORAL_TABLET | ORAL | Status: DC | PRN

## 2010-10-16 ENCOUNTER — Other Ambulatory Visit: Payer: Self-pay | Admitting: *Deleted

## 2010-10-16 DIAGNOSIS — Z8669 Personal history of other diseases of the nervous system and sense organs: Secondary | ICD-10-CM

## 2010-10-16 MED ORDER — ACETAMINOPHEN-CODEINE #3 300-30 MG PO TABS: 1.0000 | ORAL_TABLET | Freq: Three times a day (TID) | ORAL | Status: DC | PRN

## 2010-10-27 NOTE — Group Therapy Note (Signed)
NAMELAKECIA, [name] NO.:  [id number]   MEDICAL RECORD NO.:  [id number]          PATIENT TYPE:  WOC   LOCATION:  [facility]                   FACILITY:  [facility]   PHYSICIAN:  Argentina Donovan, MD        DATE OF BIRTH:  01-31-1971   DATE OF SERVICE:  06/19/2008                                  CLINIC NOTE   The patient is a 40 year old Caucasian female with HIV, who underwent  vaginal hysterectomy in 2007 was in recently for annual exam, had a Pap  smear which came back as unsatisfactory.  The patient came in also  complaining of urinary stress incontinence and of vulvar irritation,  vaginal irritation.  In talking with the patient, the significant amount  of involuntary urine loss with coughing, sneezing, laughing, and  sometimes even after she goes to the bathroom and empties, she will have  a loss of urine.  She also says that she took a bath the first time in a  long time, usually take showers and around Christmas since that time has  been having vaginal and vulvar irritation.   On examination, the external genitalia is normal.  BUN is within normal  limits, although below the urethra seems almost like a little pouch.  On  Valsalva and coughing, the entire bladder comes down.  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  The patient also has a very small  rectocele, but he is symptomatic at this point.  The wet prep was taken  as there was no sign of any vaginal discharge at all.   IMPRESSION:  Urinary stress incontinence.  The patient probably going to  need corrective surgery.  Referring her to a urologist and secondary to  that, I think that possibly if the wet prep comes negative, then her  irritation has been through taking the bath with some warm soaps that  may have caused irritation that should resolve itself on its own.           ______________________________  Argentina Donovan, MD     PR/MEDQ  D:  06/19/2008  T:  06/20/2008  Job:  [id number]

## 2010-10-27 NOTE — Group Therapy Note (Signed)
Lori [name], Lori [name] NO.:  [id number]   MEDICAL RECORD NO.:  [id number]          PATIENT TYPE:  WOC   LOCATION:  [facility]                   FACILITY:  [facility]   PHYSICIAN:  Allie Bossier, MD        DATE OF BIRTH:  [birth year]   DATE OF SERVICE:                                  CLINIC NOTE   She is here for annual.  She has no complaints.   PAST MEDICAL HISTORY:  HIV.  She is a smoker.   PAST SURGICAL HISTORY:  She had a laparoscopic-assisted vaginal  hysterectomy in 2007 for pelvic pain.  She had a sterilization.  She has  had 4 D&Cs and dental extraction.   SOCIAL HISTORY:  She smokes greater than a pack a day for the last 20  plus years.   FAMILY HISTORY:  Negative for breast cancer and negative for colon  cancer.  She does say her mother developed cervical cancer that spread  to her uterus and in her ovaries.   ALLERGIES:  PENICILLIN, SULFA and DOXYCYCLINE.  No latex allergies.   REVIEW OF SYSTEMS:  She will be having her first mammogram today.  She  has 2 living children.   OTHER MEDICAL PROBLEMS:  Obesity per se.   PHYSICAL EXAMINATION:  VITAL SIGNS:  Height 5 feet 4, weight 196 pounds,  blood pressure 124/90, pulse 67.  HEENT:  Normal.  HEART:  Regular rate and rhythm.  LUNGS:  Clear to auscultation bilaterally.  BREAST:  Normal bilaterally.  Relatively dense breast.  No nipple  discharges or skin lesions.  ABDOMEN:  No masses.  No  hepatosplenomegaly.  EXTERNAL GENITALIA:  Shaved.  No lesions.  Cuff no lesions.  Pap smear  obtained.   ASSESSMENT AND PLAN:  Annual exam.  I have checked a Pap smear today.  She has her first mammogram today.  I recommended that she continues  self-breast exams monthly and that she will be get her regular self  vulvar exams periodically.      Allie Bossier, MD     MCD/MEDQ  D:  04/19/2008  T:  04/20/2008  Job:  [id number]

## 2010-10-27 NOTE — Group Therapy Note (Signed)
Lori [name], CUTBIRTH NO.:  [id number]   MEDICAL RECORD NO.:  [id number]          PATIENT TYPE:  WOC   LOCATION:  [facility]                   FACILITY:  [facility]   PHYSICIAN:  Ginger Carne, MD DATE OF BIRTH:  Feb 11, 1971   DATE OF SERVICE:  01/13/2007                                  CLINIC NOTE   This patient is a 40 year old Caucasian female, HIV positive, who is  here today for a routine gynecological evaluation.  She has had a total  vaginal hysterectomy on 12/20/2005 for chronic pelvic pain and  dysmenorrhea.  She has had no specific complaints and has otherwise had  no problems related to her surgery.   PHYSICAL EXAMINATION:  VITAL SIGNS:  Per hospital record.  HEENT:  Grossly normal.  BREAST EXAM:  Without mass, discharge, thickening or tenderness.  CHEST:  Clear to percussion and auscultation.  CARDIOVASCULAR:  Without murmurs or enlargement.  Regular rate and  rhythm.  EXTREMITIES:  Normal.  LYMPHATICS:  Normal.  SKIN:  Normal.  NEUROLOGIC:  Normal.  MUSCULOSKELETAL:  Normal.  ABDOMEN:  Soft, no hepatosplenomegaly.  PELVIC:  External genitalia, vulva and vagina normal.  Cervix is absent.  Cuff well healed.  Uterus absent.  Both adnexa palpable and found to be  normal.   IMPRESSION:  Normal yearly gynecological examination, status post total  vaginal hysterectomy.  Recheck one year.           ______________________________  Ginger Carne, MD     SHB/MEDQ  D:  01/13/2007  T:  01/13/2007  Job:  [id number]

## 2010-10-30 NOTE — H&P (Signed)
NAME:  Lori [name], Lori [name]               ACCOUNT NO.:  0   MEDICAL RECORD NO.:  [id number]           PATIENT TYPE:   LOCATION:                               FACILITY:  [facility]   PHYSICIAN:  Tanya S. Shawnie Pons, M.D.   DATE OF BIRTH:  [birth year]   DATE OF ADMISSION:  12/20/2005  DATE OF DISCHARGE:                                HISTORY & PHYSICAL   CHIEF COMPLAINT:  Need for hysterectomy.   HISTORY OF PRESENT ILLNESS:  The patient is a 40 year old, G7, P2-0-5-2 who  has significant pelvic pain, chronic abnormal Pap smears and HIV disease who  also has bad PMS and disabling dysmenorrhea and desires permanent treatment.  The patient is a heavy smoker and is not a good candidate for hormone  manipulation.   PAST MEDICAL HISTORY:  1.  HIV.  2.  Frequent kidney infections.   PAST SURGICAL HISTORY:  Tubal ligation.   ALLERGIES:  PENICILLIN AND SULFA.   MEDICATIONS:  Ambien and Xanax as needed.   PAST OBSTETRICAL HISTORY:  G7, P2 with two vaginal deliveries, 3 TABs, 2  SABs.  She had history of menarche at age 32.  Cycles are monthly and last 7  days with regular flow.  History of tubal ligation with cryosurgery in 1996.  Multiple abnormal Pap smears.   FAMILY HISTORY:  Cervical prostate and lung cancer.  No history of adverse  reaction to anesthesia.   SOCIAL HISTORY:  Smoker with 2 packs per day for the past 22 years.   REVIEW OF SYSTEMS:  A 13-point review of systems reviewed and was negative  for fevers, chills, nausea, vomiting, diarrhea, constipation, chest pain,  shortness of breath, cough, trouble with vision or sight, lower extremity  swelling.  The patient's last CD-4 count was 490.  Viral load currently  undetectable.   PHYSICAL EXAMINATION:  GENERAL:  A pleasant, well-nourished, white female in  no acute distress.  VITAL SIGNS:  Temperature 97.7, pulse 77, blood pressure 98/68, weight 177.2  (72 kg), height 5 feet 4 inches.  LUNGS:  Clear bilaterally.  CARDIAC:  Regular  rate and rhythm, no murmurs, rubs or gallops.  ABDOMEN:  Soft, nontender, nondistended.  NECK:  Supple with normal thyroid.  EXTREMITIES:  Lower extremities with no clubbing, cyanosis or edema.  PELVIC:  Normal external female genitalia.  BUS is normal.  Vagina is pink  and rugae.  Cervix anterior without lesions.  Uterus is retroverted  approximately 6- to 8-week size.  Adnexa without masses or tenderness.   LABORATORY DATA AND X-RAY FINDINGS:  Last Pap smear showed low-grade SIL,  positive high-risk HPV.  Biopsies show focal atypia.  Ultrasound shows a  normal-appearing retroverted uterus and a hemorrhagic cyst on the left  ovary.   IMPRESSION:  1.  Dysmenorrhea.  2.  Pelvic pain.  3.  Continuous abnormal Pap smear for human immunodeficiency virus.  4.  Heavy smoker.   PLAN:  Transvaginal hysterectomy.           ______________________________  Shelbie Proctor Shawnie Pons, M.D.     TSP/MEDQ  D:  12/02/2005  T:  12/02/2005  Job:  [id number]

## 2010-10-30 NOTE — Discharge Summary (Signed)
Lori [name], Lori [name]           ACCOUNT NO.:  [id number]   MEDICAL RECORD NO.:  [id number]          PATIENT TYPE:  INP   LOCATION:  9305                          [facility]   PHYSICIAN:  Tanya S. Shawnie Pons, M.D.   DATE OF BIRTH:  08/15/1970   DATE OF ADMISSION:  12/20/2005  DATE OF DISCHARGE:  12/22/2005                                 DISCHARGE SUMMARY   FINAL DIAGNOSES:  1.  Dysmenorrhea.  2.  Pelvic pain.  3.  Abnormal Pap smear.  4.  Human immunodeficiency virus.   PROCEDURES:  Transvaginal hysterectomy and diagnostic laparoscopy.   PERTINENT LABS:  Free hemoglobin, preoperative hemoglobin 14.2,  postoperative hemoglobin 6.9, then the next day was 7.8.  Serum HCG was  negative.  The latest CD4 count was 490.   REASON FOR ADMISSION:  Patient is a 40 year old gravida 7, para 2, 0-5-2,  who has HIV, who had disabling dysmenorrhea and PMS as well as chronic  abnormal Pap smears.  The patient had undergone colposcopy and was also a  heavy smoker and not a good candidate for hormonal manipulation.  For this  reason, she desired permanent treatment with hysterectomy.   HOSPITAL COURSE:  Patient was admitted and underwent a transvaginal  hysterectomy on the day of admission.  Postoperatively, she had some heavy  vaginal bleeding and was taken back to the operating room, where a  diagnostic scope was performed.  At that time, there was blood found in the  abdomen but no bleeder was found.  Postoperatively, the patient was taken  back to her room, where she continued to recover while her Foley was  discontinued on postop day #1, and she was able to void without difficulty.  She also had some nausea and vomiting related to anesthesia for the first  day and a half postoperatively but was able to tolerate lunch without any  nausea or vomiting on the day of discharge.  The patient remained afebrile  throughout her hospital course and was feeling well at the time of  discharge.   DISCHARGE CONDITION:  Patient is discharged home in good condition.  Followup will be in the GYN clinic on August 2nd at 12:45 p.m.   DISCHARGE MEDICATIONS:  1.  Percocet 5/325 1-2 p.o. q.4-6h. p.r.n. pain, #45.  2.  Chromogen 1 p.o. b.i.d. for the next 2-3 weeks.   Patient is also instructed to return with persistent nausea and vomiting or  fever and chills.          ______________________________  Shelbie Proctor Shawnie Pons, M.D.    TSP/MEDQ  D:  12/22/2005  T:  12/22/2005  Job:  [id number]

## 2010-10-30 NOTE — Op Note (Signed)
NAMEDONELLA, PASCARELLA           ACCOUNT NO.:  [id number]   MEDICAL RECORD NO.:  [id number]          PATIENT TYPE:  OIB   LOCATION:  9305                          [facility]   PHYSICIAN:  Tanya S. Shawnie Pons, M.D.   DATE OF BIRTH:  10-23-1970   DATE OF PROCEDURE:  12/20/2005  DATE OF DISCHARGE:                                 OPERATIVE REPORT   PREOPERATIVE DIAGNOSES:  Dysmenorrhea, pelvic pain, abnormal Pap smear,  human immunodeficiency virus.   POSTOPERATIVE DIAGNOSIS:  Dysmenorrhea, pelvic pain, abnormal Pap smear,  human immunodeficiency virus.   PROCEDURE:  Transvaginal hysterectomy.   SURGEON:  Shelbie Proctor. Shawnie Pons, M.D.   ANESTHESIA:  General and local.   SPECIMENS:  Uterus and cervix.   ESTIMATED BLOOD LOSS:  250 mL.   COMPLICATIONS:  None.   REASON FOR PROCEDURE:  The patient is a 40 year old gravida 7, para 2-0-5-2,  who is status post tubal ligation, who has recurrent abnormal Pap smears and  HIV, who has disabling dysmenorrhea, who is not a good candidate for hormone  manipulation and treatment of PMS, secondary to tobacco use and age close to  52.  All options and risks were discussed with this patient prior to  proceeding and she agreed to proceed.   DESCRIPTION OF PROCEDURE:  The patient was taken to the OR.  She was placed  in dorsal lithotomy in [place] stirrups.  She was prepped and draped in the  usual sterile fashion.  A red rubber catheter was used to drain her bladder.  A weighted speculum was then placed inside the vagina.  The cervix was  visualized and grasped with a single-toothed tenaculum and infiltrated with  10 mL of 0.25% Marcaine with epinephrine.  A circumferential incision was  made through the vagina and blunt dissection was used to dissect the vagina  off the cervix.  Sharp dissection was then used to enter the anterior  peritoneal cavity, without difficulty.  Similarly, the posterior peritoneal  cavity was entered and the posterior  peritoneum tacked to the posterior edge  of the vagina.  The uterosacral ligaments were then clamped with hemiclamps,  cut, and suture ligated with a Heaney-type suture of 0-Vicryl suture.  The  uterine arteries were then clamped, cut, and suture ligated.  The uterus was  then inverted and the tubo-ovarian pedicles were clamped.  A free tie was  placed around the clamp, which was flushed, followed by a suture ligature  about the pedicle.  These were held on to and were found to be hemostatic.  There was some bleeding along the cuff, especially on the patient's right  side, and a __________ was used to grab this area and a figure-of-eight  suture ligature was placed to achieve excellent hemostasis.  All pedicles  were inspected and found to be hemostatic and the cuff was closed, tying the  vagina to the uterosacral ligaments bilaterally in a locked running fashion.  The vagina  was then packed with 1-inch packing and a Foley catheter was placed inside  the bladder.  Clear yellow urine was noted.  All instrument and lap counts  were correct x2.  The patient was awakened and taken to the recovery room in  stable condition.           ______________________________  Shelbie Proctor Shawnie Pons, M.D.     TSP/MEDQ  D:  12/20/2005  T:  12/20/2005  Job:  [id number]

## 2010-10-30 NOTE — Op Note (Signed)
Lori [name], Lori [name]           ACCOUNT NO.:  [id number]   MEDICAL RECORD NO.:  [id number]          PATIENT TYPE:  OIB   LOCATION:  9305                          [facility]   PHYSICIAN:  Ginger Carne, MD  DATE OF BIRTH:  Aug 02, 1970   DATE OF PROCEDURE:  12/20/2005  DATE OF DISCHARGE:                                 OPERATIVE REPORT   PREOPERATIVE DIAGNOSIS:  postoperative hemorrhage.   POSTOPERATIVE DIAGNOSIS:  postoperative hemorrhage.   PROCEDURE:  Laparoscopic evacuation of pelvic hematoma.   SURGEON:  Shelbie Proctor. Shawnie Pons, M.D.   ASSISTANT:  Ginger Carne, MD.   ESTIMATED BLOOD LOSS:  1000 mL.   COMPLICATIONS:  None immediate.   ANESTHESIA:  General.   SPECIMEN:  None.   OPERATIVE FINDINGS:  The evaluation of the vaginal cuff from the vaginal  side revealed evidence of frank bleeding but not from the cuff edges.  Laparoscopic evaluation demonstrated approximately 1000 mL of fresh clotted  blood.  Careful inspection of the uterine pedicles, vaginal cuff, utero-  ovarian pedicles, as well as the infundibulopelvic ligaments to identify an  active source of bleeding.  Similarly, there was no active bleeding along  the pelvic sidewalls.  Copious irrigation with removal of said irrigant,  lactated Ringer's, revealed an intact pelvis as well as cuff.  The bladder  surface was smooth without active bleeding.  Both ovaries and tubes  demonstrated no evidence of active bleeding.   OPERATIVE PROCEDURE:  The patient prepped and draped in the usual fashion  and placed in lithotomy position.  Betadine solution used for antiseptic and  the patient was catheterized prior to procedure.  After adequate general  anesthesia, a vertical infraumbilical incision was made and the Veress  needle placed the abdomen.  Opening and closing pressures were 10 and 15  mmHg.  The medial release trocar placed through the same incision,  laparoscope placed in the trocar sleeve.  Three 5 mm  ports were made in the  left lower quadrant, left hypogastric region and right hypogastric regions,  respectively.  Following this, inspection was carried out, copious  irrigation with lactated Ringer's and removal of irrigant followed.  Specific cautery where indicated was performed but, again, no active  bleeding sites were noted as described.  Gas released, trocars removed.  Closure of 10 mm fascial site with 0 Vicryl suture and 4-0 Vicryl for  subcuticular closure.  The patient tolerated the procedure well, returned to  the post anesthesia recovery room in excellent condition.      Ginger Carne, MD  Electronically Signed     SHB/MEDQ  D:  12/20/2005  T:  12/21/2005  Job:  [id number]

## 2010-10-30 NOTE — Group Therapy Note (Signed)
Lori [name], [place] NO.:  [id number]   MEDICAL RECORD NO.:  [id number]          PATIENT TYPE:  WOC   LOCATION:  [facility]                   FACILITY:  [facility]   PHYSICIAN:  Tinnie Gens, MD        DATE OF BIRTH:  April 12, 1971   DATE OF SERVICE:  04/15/2005                                    CLINIC NOTE   CHIEF COMPLAINT:  Vaginal lump excision.   HISTORY OF PRESENT ILLNESS:  Patient is a 40 year old HIV positive patient  who was previously seen in September of this year who had a lump in the  vagina that has been there and been unchanged for the past 10 years.  However, she got a new one that was superior to the previous one and given  her HIV status was worried and wanted to have this lump evaluated.   PROCEDURE:  A speculum was placed inside the vagina.  Area was cleaned with  Betadine.  It was injected with 1% lidocaine with epinephrine and a knife  was used to make a stab incision along the vaginal mucosa.  Attempt was made  several times to excise the mass which could be felt, but not brought up to  the incision.  A smaller section of approximately 0.75 x 0.25 section of the  vagina was then removed and the lump was grasped and then formally excised.  A moderate amount of bleeding was noted at the biopsy site and this  __________  vagina was closed with two interrupted 2-0 chromic sutures.  Patient tolerated the procedure well and was hemostatic at the end.   IMPRESSION:  1.  Vaginal lump.  2.  HIV disease.  3.  Low grade SIL.   PLAN:  Send the biopsy for pathology.  Suspect it is benign.  Will not get  the other one out if this is true.  Patient is to return in December for  colposcopy.           ______________________________  Tinnie Gens, MD     TP/MEDQ  D:  04/16/2005  T:  04/16/2005  Job:  [id number]

## 2010-10-30 NOTE — Op Note (Signed)
[facility]  Patient:    Lori [name], Lori [name] Visit Number: [id number] MRN: [id number]          Service Type: OBS Location: 910A 9139 [id number] Attending Physician:  Enid Cutter Dictated by:   Conni Elliot, M.D. Proc. Date: 10/01/01 Admit Date:  09/30/2001                             Operative Report  PREOPERATIVE DIAGNOSIS:       Desire for surgical sterilization.  POSTOPERATIVE DIAGNOSIS:      Desire for surgical sterilization.  OPERATION:                    Modified bilateral Pomeroy tubal ligation.  SURGEON:                      Conni Elliot, M.D.  ASSISTANT:  ANESTHESIA:                   Continuous lumbar epidural.  FINDINGS:                     Uterus, tubes, and ovaries normal for postgravid state.  ESTIMATED BLOOD LOSS:  DESCRIPTION OF PROCEDURE:     After placing the patient under continuous lumbar epidural anesthetic, the patient was supine and having been prepped and draped in the usual sterile fashion, a periumbilical incision was made.  The incision was made through the skin, subcutaneous tissue, and fascia.  The peritoneal cavity entered.  The right fallopian tube was identified, grasped with Babcock clamp, brought into the operative field, doubly sutured ligated. Approximately 1.5 to 2 cm segment was excised.  Hemostasis was adequate.  A similar procedure was done on the left and hemostasis again adequate. Anterior peritoneum, fascia, subcutaneous tissue, and skin were closed in routine fashion.  Estimated blood loss was less than 10 cc.  Sponge, needle, and instrument counts were correct. Dictated by:   Conni Elliot, M.D. Attending Physician:  Enid Cutter DD:  10/01/01 TD:  10/02/01 Job: [id number]

## 2010-10-30 NOTE — Group Therapy Note (Signed)
[name], [name] NO.:  [id number]   MEDICAL RECORD NO.:  [id number]          PATIENT TYPE:  WOC   LOCATION:  [facility]                   FACILITY:  [facility]   PHYSICIAN:  Lori Gens, MD        DATE OF BIRTH:  [birth year]   DATE OF SERVICE:  02/19/2005                                    CLINIC NOTE   CHIEF COMPLAINT:  Lumps inside the vagina.   HISTORY OF PRESENT ILLNESS:  The patient is a 40 year old gravida 7 para 2-0-  5-2 who is referred from Dr. Johny [name] from ID clinic because of lumps  in the vagina. The patient reports that one has been there and is unchanged  in size or texture for the last 10 years. The other one is a newer one. It  is more superior to the previous one and is not as big. However, given her  immune status, she is worried and wanted to get these things checked out.   The patient also has a history of cryotherapy in 1996. Her last Pap was  December 2005 and she had low-grade SIL. She has had no follow-up for this.   PAST MEDICAL HISTORY:  Significant for HIV and kidney disease.   PAST SURGICAL HISTORY:  Tubal ligation.   ALLERGIES:  PENICILLIN and SULFA.   MEDICATIONS:  Truvada, Sustiva, Ambien, Xanax.   OBSTETRICAL HISTORY:  She is a G7 P2 with two vaginal deliveries.   GYNECOLOGICAL HISTORY:  Menarche at age 52, cycles monthly, last 7 days,  regular flow. She is status post tubal ligation.   FAMILY HISTORY:  Significant for cervical, prostate, and lung cancer.   SOCIAL HISTORY:  She is a smoker, two packs per day for the past 22 years.  She does have history of drug use as well.   REVIEW OF SYSTEMS:  A 14-point review of systems reviewed. Please see GYN  history in the chart. It is positive for bruising, swelling in the legs,  muscle aches, fevers, night sweats, fatigue, and weight gain, which are  mostly related to her HIV medications and her HIV disease.   PHYSICAL EXAMINATION:  VITAL SIGNS:  As noted in the  chart.  GENERAL:  She is a moderately-obese white female in no acute distress.  GENITOURINARY:  Normal external female genitalia. The vagina is pink and  rugated. The cervix is anterior and without lesion. The uterus is  retroverted and approximately 6- to 8-week size. The adnexa are without  masses or tenderness.  Approximately 1 to 1.5 cm inside the vagina on the  right-hand side above the hymenal ring, there is a very firm, mobile 0.5 mm  x 0.5 mm mass. There is a second one approximately another centimeter to  centimeter-and-a-half above that that is of the same texture but has a  flatter feel but remains mobile.   IMPRESSION:  1.  History of abnormal Pap, cervical intraepithelial neoplasia grade 1, in      December 2005 and no follow-up since then.  2.  Human immunodeficiency virus disease.  3.  Vaginal mass.   PLAN:  1.  Pap smear  today.  2.  Will follow up any abnormalities with colposcopy and further treatment      as indicated.  3.  Continue her HIV medications and recommendations per Dr. Johny [name].  4.  Will bring her back in 3-4 weeks for local anesthetic and excision of a      vaginal mass. The patient understands this and will follow up.           ______________________________  Lori Gens, MD     TP/MEDQ  D:  02/19/2005  T:  02/19/2005  Job:  [id number]   cc:   Lori [name]  Fax: [phone]

## 2010-10-30 NOTE — Group Therapy Note (Signed)
Lori [name], Lori [name] NO.:  [id number]   MEDICAL RECORD NO.:  [id number]          PATIENT TYPE:  WOC   LOCATION:  [facility]                   FACILITY:  [facility]   PHYSICIAN:  Ginger Carne, MD DATE OF BIRTH:  Oct 24, 1970   DATE OF SERVICE:  09/24/2005                                    CLINIC NOTE   REASON FOR CONSULTATION:  Chronic pelvic pain.   HISTORY OF PRESENT ILLNESS:  This is a 40 year old Caucasian female, gravida  7, para 2-0-5-2, HIV positive in remission who presents with a long-standing  history of chronic pelvic pain lasting several years.  Pain is worsened with  her menses, between her periods and with intercourse. The patient has  utilized nonsteroidal anti-inflammatory agents in the past and this has not  been beneficial.  She has no genitourinary, gastrointestinal or  musculoskeletal sources for said pain.  The patient has been followed by Dr.  Shawnie Pons.  Patient had a pelvic sonogram performed on August 20, 2005, which was  normal, specifically normal uterus and both ovaries without adnexal masses  or free fluid.   The patient is desirous of a surgical solution to her pain, does not wish to  utilize oral contraceptives or a trial of Depo-Lupron.   The patient's medical and surgical history are well documented in the clinic  note of February 19, 2005.  Specifically patient states that her viral load  is undetectable and her CD4 count is over 300.  She is followed by Dr.  [name].   After discussing the pros and cons of various options medically and  surgically and in consultation with Dr. Shawnie Pons, the patient is agreeable to  Dr. Shawnie Pons with her permission, performing a laparoscopically assisted  vaginal hysterectomy  with possible unilateral or bilateral salpingo-  oophorectomy.  The patient has not been previously scoped and will have an  unknown staging if this is, in fact, endometriosis.  The nature of said  procedure discussed in detail.   The patient understands that all attempts  will be made to preserve both ovaries or at least one ovary, however, she  also understands that if she does have endometriosis, leaving one ovary will  give her a 40% chance of recurrent pain with possible removal of said  structure.  Under these circumstances, the patient is agreeable to a  bilateral salpingo-oophorectomy and estrogen replacement therapy was  discussed.  Risks including possible injuries to ureter, bowel and bladder,  possible conversion to an open procedure, hemorrhage possibly requiring  blood transfusion, infection, and other unforeseen complications including  pulmonary complications were discussed with the patient.   Patient desirous of said surgery in July of 2007.  For this reason, the  patient was asked to return to the clinic in approximately three weeks prior  to her surgery for an update on any medications and also she will have Dr.  [name] perform a CD4 count and viral load check two to three weeks prior to  her surgery to assure that she is in satisfactory  preoperative status for said surgery.  All questions answered in  satisfaction of said patient and all  answers understood.           ______________________________  Ginger Carne, MD     SHB/MEDQ  D:  09/24/2005  T:  09/24/2005  Job:  [id number]

## 2010-12-15 ENCOUNTER — Other Ambulatory Visit: Payer: Self-pay | Admitting: Infectious Diseases

## 2010-12-15 DIAGNOSIS — F419 Anxiety disorder, unspecified: Secondary | ICD-10-CM

## 2010-12-28 ENCOUNTER — Other Ambulatory Visit: Payer: Medicare Other

## 2011-01-11 ENCOUNTER — Ambulatory Visit: Payer: Self-pay | Admitting: Infectious Diseases

## 2011-01-12 ENCOUNTER — Other Ambulatory Visit: Payer: Self-pay | Admitting: Infectious Diseases

## 2011-01-12 DIAGNOSIS — F419 Anxiety disorder, unspecified: Secondary | ICD-10-CM

## 2011-01-12 NOTE — Telephone Encounter (Signed)
Due on 01/15/11. To md for signature

## 2011-01-19 ENCOUNTER — Telehealth: Payer: Self-pay | Admitting: *Deleted

## 2011-01-19 NOTE — Telephone Encounter (Signed)
Called the rx to [facility] in [place]. MD had signed the rx. Told pt she must be seen. Transferred to front to make an appt

## 2011-02-11 ENCOUNTER — Other Ambulatory Visit: Payer: Self-pay | Admitting: Infectious Diseases

## 2011-02-12 ENCOUNTER — Other Ambulatory Visit: Payer: Self-pay | Admitting: *Deleted

## 2011-02-12 DIAGNOSIS — F419 Anxiety disorder, unspecified: Secondary | ICD-10-CM

## 2011-02-12 MED ORDER — ALPRAZOLAM 2 MG PO TABS: 2.0000 mg | ORAL_TABLET | Freq: Two times a day (BID) | ORAL | Status: DC

## 2011-02-23 ENCOUNTER — Other Ambulatory Visit (INDEPENDENT_AMBULATORY_CARE_PROVIDER_SITE_OTHER): Payer: Medicare Other

## 2011-02-23 DIAGNOSIS — B20 Human immunodeficiency virus [HIV] disease: Secondary | ICD-10-CM

## 2011-02-23 DIAGNOSIS — Z113 Encounter for screening for infections with a predominantly sexual mode of transmission: Secondary | ICD-10-CM

## 2011-02-23 DIAGNOSIS — Z79899 Other long term (current) drug therapy: Secondary | ICD-10-CM

## 2011-02-23 LAB — CBC WITH DIFFERENTIAL/PLATELET
Basophils Absolute: 0.1 10*3/uL (ref 0.0–0.1)
Eosinophils Absolute: 0.1 10*3/uL (ref 0.0–0.7)
Eosinophils Relative: 2 % (ref 0–5)
HCT: 38.5 % (ref 36.0–46.0)
Lymphs Abs: 2.3 10*3/uL (ref 0.7–4.0)
MCH: 33.5 pg (ref 26.0–34.0)
MCV: 97.7 fL (ref 78.0–100.0)
Monocytes Absolute: 0.2 10*3/uL (ref 0.1–1.0)
Platelets: 174 10*3/uL (ref 150–400)
RDW: 12.6 % (ref 11.5–15.5)

## 2011-02-23 LAB — RPR

## 2011-02-23 LAB — URINALYSIS, ROUTINE W REFLEX MICROSCOPIC
Bilirubin Urine: NEGATIVE
Glucose, UA: NEGATIVE mg/dL
Hgb urine dipstick: NEGATIVE
Ketones, ur: NEGATIVE mg/dL
Protein, ur: NEGATIVE mg/dL
Urobilinogen, UA: 0.2 mg/dL (ref 0.0–1.0)

## 2011-02-23 LAB — LIPID PANEL
HDL: 34 mg/dL — ABNORMAL LOW (ref >39)
LDL Cholesterol: 123 mg/dL — ABNORMAL HIGH (ref 0–99)
Total CHOL/HDL Ratio: 5.6 Ratio
VLDL: 33 mg/dL (ref 0–40)

## 2011-02-24 LAB — COMPLETE METABOLIC PANEL WITH GFR
ALT: 18 U/L (ref 0–35)
BUN: 10 mg/dL (ref 6–23)
CO2: 24 mEq/L (ref 19–32)
Creat: 0.76 mg/dL (ref 0.50–1.10)
GFR, Est African American: >60 mL/min (ref >60)
Total Bilirubin: 0.3 mg/dL (ref 0.3–1.2)

## 2011-02-24 LAB — GC/CHLAMYDIA PROBE AMP, URINE
Chlamydia, Swab/Urine, PCR: NEGATIVE
GC Probe Amp, Urine: NEGATIVE

## 2011-02-24 LAB — HIV-1 RNA QUANT-NO REFLEX-BLD: HIV 1 RNA Quant: <20 copies/mL (ref <20)

## 2011-02-24 LAB — T-HELPER CELL (CD4) - (RCID CLINIC ONLY)
CD4 % Helper T Cell: 34 % (ref 33–55)
CD4 T Cell Abs: 770 uL (ref 400–2700)

## 2011-03-10 ENCOUNTER — Encounter: Payer: Self-pay | Admitting: Infectious Diseases

## 2011-03-10 ENCOUNTER — Other Ambulatory Visit: Payer: Medicare Other | Admitting: Infectious Diseases

## 2011-03-10 ENCOUNTER — Ambulatory Visit (INDEPENDENT_AMBULATORY_CARE_PROVIDER_SITE_OTHER): Payer: Medicare Other | Admitting: *Deleted

## 2011-03-10 DIAGNOSIS — Z23 Encounter for immunization: Secondary | ICD-10-CM

## 2011-03-10 DIAGNOSIS — B20 Human immunodeficiency virus [HIV] disease: Secondary | ICD-10-CM

## 2011-03-12 ENCOUNTER — Other Ambulatory Visit: Payer: Self-pay | Admitting: Infectious Diseases

## 2011-03-16 ENCOUNTER — Ambulatory Visit: Payer: Medicare Other

## 2011-03-16 ENCOUNTER — Telehealth: Payer: Self-pay | Admitting: *Deleted

## 2011-03-16 NOTE — Telephone Encounter (Signed)
Pt received flu vaccine at visit on 03/10/11.  She now has "flu-like" symptoms.  "Not like the full blown flu but close to it."  She wanted Dr. [name] to know about the symptoms.  She is going to call the Center back for her 59-month follow up visit as soon as she feels better.  Jennet Maduro, RN

## 2011-03-17 ENCOUNTER — Other Ambulatory Visit: Payer: Self-pay | Admitting: Infectious Diseases

## 2011-03-17 DIAGNOSIS — F419 Anxiety disorder, unspecified: Secondary | ICD-10-CM

## 2011-03-19 ENCOUNTER — Other Ambulatory Visit: Payer: Self-pay | Admitting: Infectious Diseases

## 2011-03-19 LAB — T-HELPER CELL (CD4) - (RCID CLINIC ONLY): CD4 % Helper T Cell: 32 % — ABNORMAL LOW (ref 33–55)

## 2011-03-19 NOTE — Telephone Encounter (Signed)
Why am I getting this?

## 2011-03-24 LAB — T-HELPER CELL (CD4) - (RCID CLINIC ONLY): CD4 % Helper T Cell: 31 — ABNORMAL LOW

## 2011-03-29 ENCOUNTER — Telehealth: Payer: Self-pay | Admitting: *Deleted

## 2011-03-29 NOTE — Telephone Encounter (Signed)
Called c/o being sick ever since she got the flu shot here 03/10/11. States she is still smoking & she has L sided chest pain when she coughs. Denies fever or SOB. The mucous is green. Told her she should be seen. I offered to transfer to front to make an appt. States she had 3 tables walk in & she would call back. I told Vikki Ports in front office if she could not be seen today, to transfer the call back to me. Pt needs to be seen promptly

## 2011-04-01 ENCOUNTER — Encounter: Payer: Self-pay | Admitting: Infectious Diseases

## 2011-04-01 ENCOUNTER — Ambulatory Visit (INDEPENDENT_AMBULATORY_CARE_PROVIDER_SITE_OTHER): Payer: Medicare Other | Admitting: Infectious Diseases

## 2011-04-01 DIAGNOSIS — B20 Human immunodeficiency virus [HIV] disease: Secondary | ICD-10-CM

## 2011-04-01 DIAGNOSIS — M940 Chondrocostal junction syndrome [Tietze]: Secondary | ICD-10-CM | POA: Insufficient documentation

## 2011-04-01 DIAGNOSIS — F411 Generalized anxiety disorder: Secondary | ICD-10-CM

## 2011-04-01 DIAGNOSIS — Z79899 Other long term (current) drug therapy: Secondary | ICD-10-CM

## 2011-04-01 DIAGNOSIS — Z113 Encounter for screening for infections with a predominantly sexual mode of transmission: Secondary | ICD-10-CM

## 2011-04-01 NOTE — Progress Notes (Signed)
  Subjective:    Patient ID: Lori [name], female    DOB: 1971-05-24, 40 y.o.   MRN: [id number]  HPI 40 yo F with HIV+, anxiety and previous cocaine abuse ( none since April 09).   Had knee surgery 04-29-10 to remove a bakers cyst. It went very smoothly for her.  CD4 770 and VL und (02-23-11).Medicine going well.  States she has been having cough and CP for 3 weeks. Had fever this AM, felt like she was going to burn up.  Has radiated to under her L breast. Worse with coughing. Worse with rotation. Has had episodes of "hostility", depression.  Has gotten flu shot this year. Was supposed to get mammo but she does not want to (hurts!).    Review of Systems  Constitutional: Positive for fever. Negative for chills.  Gastrointestinal: Negative for diarrhea and constipation.  Genitourinary: Negative for dysuria.       Objective:   Physical Exam  Constitutional: She appears well-developed and well-nourished.  Eyes: EOM are normal. Pupils are equal, round, and reactive to light.  Neck: Neck supple.  Cardiovascular: Normal rate, regular rhythm and normal heart sounds.   Pulmonary/Chest: Effort normal and breath sounds normal. She exhibits tenderness.       Has tenderness under L breast with deep palpation.   Abdominal: Soft. Bowel sounds are normal. She exhibits no distension. There is no tenderness.  Lymphadenopathy:    She has no cervical adenopathy.  Psychiatric:       Describes her self under a tremendous amount of stress.           Assessment & Plan:

## 2011-04-01 NOTE — Assessment & Plan Note (Signed)
i offered to get her into counseling but she does not believe she can travel to [place]. She is somewhat isolated from her family, and with her son, she has difficulty getting with other people.

## 2011-04-01 NOTE — Assessment & Plan Note (Signed)
i suggested she try motrin for this. She iwll call if this is not improved over the next  4-5 days and if she has further fevers (will start anbx).

## 2011-04-01 NOTE — Assessment & Plan Note (Signed)
She is doing very well. i suspect she his mild intercostatl strain.  She has gotten flu shot, is not sexually active. Will see her back in 6 months with labs prior.

## 2011-04-01 NOTE — Telephone Encounter (Signed)
She has an appt today.

## 2011-04-06 ENCOUNTER — Other Ambulatory Visit: Payer: Self-pay | Admitting: *Deleted

## 2011-04-06 ENCOUNTER — Telehealth: Payer: Self-pay | Admitting: *Deleted

## 2011-04-06 DIAGNOSIS — Z720 Tobacco use: Secondary | ICD-10-CM

## 2011-04-06 DIAGNOSIS — J329 Chronic sinusitis, unspecified: Secondary | ICD-10-CM

## 2011-04-06 MED ORDER — NICOTINE 21 MG/24HR TD PT24: 1.0000 | MEDICATED_PATCH | TRANSDERMAL | Status: DC

## 2011-04-06 MED ORDER — AMOXICILLIN-POT CLAVULANATE 875-125 MG PO TABS: 1.0000 | ORAL_TABLET | Freq: Two times a day (BID) | ORAL | Status: DC

## 2011-04-06 NOTE — Telephone Encounter (Signed)
Patient called stating she has a "sinus infection" and is requesting an RX for Augmentin, states she has sinus congestion x 3 days.  Also said she wants to quit smoking and is requesting and Rx for Nicoderm, and she wants the highest dose.  She would like these called to [facility].  I suggested to the patient that she would benefit from a PCP and gave her the phone number to [facility]. Wendall Mola CMA

## 2011-04-06 NOTE — Telephone Encounter (Signed)
OK for RXs per Dr. [name] and sent to [facility] in [place].

## 2011-04-13 ENCOUNTER — Other Ambulatory Visit: Payer: Self-pay | Admitting: Infectious Diseases

## 2011-04-13 DIAGNOSIS — F419 Anxiety disorder, unspecified: Secondary | ICD-10-CM

## 2011-04-13 DIAGNOSIS — B20 Human immunodeficiency virus [HIV] disease: Secondary | ICD-10-CM

## 2011-04-13 DIAGNOSIS — R52 Pain, unspecified: Secondary | ICD-10-CM

## 2011-05-09 ENCOUNTER — Other Ambulatory Visit: Payer: Self-pay | Admitting: Infectious Diseases

## 2011-05-09 DIAGNOSIS — F419 Anxiety disorder, unspecified: Secondary | ICD-10-CM

## 2011-05-09 DIAGNOSIS — R52 Pain, unspecified: Secondary | ICD-10-CM

## 2011-05-10 NOTE — Telephone Encounter (Signed)
I called the [facility] to check if she had used her refills. They state they had these rxs called in with no refills. She is due now. I gave them over the phone

## 2011-05-24 ENCOUNTER — Other Ambulatory Visit: Payer: Self-pay | Admitting: Infectious Diseases

## 2011-06-03 ENCOUNTER — Other Ambulatory Visit: Payer: Self-pay | Admitting: Infectious Diseases

## 2011-06-03 DIAGNOSIS — F419 Anxiety disorder, unspecified: Secondary | ICD-10-CM

## 2011-06-03 DIAGNOSIS — M25552 Pain in left hip: Secondary | ICD-10-CM

## 2011-06-03 DIAGNOSIS — J329 Chronic sinusitis, unspecified: Secondary | ICD-10-CM

## 2011-06-03 MED ORDER — AMOXICILLIN-POT CLAVULANATE 875-125 MG PO TABS: 1.0000 | ORAL_TABLET | Freq: Two times a day (BID) | ORAL | Status: DC

## 2011-06-03 NOTE — Telephone Encounter (Signed)
Patient called requesting a prescription for Augmentin to have on hand for when she gets a sinus infection.  At this time she is not having any symptoms.  She also said that when she gets the prescription she only takes half the Rx and saves the rest for when she has another episode.  Explained to her that it was not a good idea to take antibiotics when it was not absolutely necessary.  Uses [facility] in [place]. Wendall Mola CMA

## 2011-06-03 NOTE — Telephone Encounter (Signed)
Patient requesting refill on Augmentin

## 2011-07-03 ENCOUNTER — Other Ambulatory Visit: Payer: Self-pay | Admitting: Infectious Diseases

## 2011-07-05 ENCOUNTER — Other Ambulatory Visit: Payer: Self-pay | Admitting: *Deleted

## 2011-07-05 DIAGNOSIS — B20 Human immunodeficiency virus [HIV] disease: Secondary | ICD-10-CM

## 2011-07-05 DIAGNOSIS — M25552 Pain in left hip: Secondary | ICD-10-CM

## 2011-07-05 DIAGNOSIS — F419 Anxiety disorder, unspecified: Secondary | ICD-10-CM

## 2011-07-05 MED ORDER — ALPRAZOLAM 2 MG PO TABS: 2.0000 mg | ORAL_TABLET | Freq: Two times a day (BID) | ORAL | Status: DC | PRN

## 2011-07-05 MED ORDER — ACETAMINOPHEN-CODEINE #3 300-30 MG PO TABS: 1.0000 | ORAL_TABLET | Freq: Three times a day (TID) | ORAL | Status: DC | PRN

## 2011-07-05 MED ORDER — EFAVIRENZ-EMTRICITAB-TENOFOVIR 600-200-300 MG PO TABS: 1.0000 | ORAL_TABLET | Freq: Every day | ORAL | Status: DC

## 2011-07-05 NOTE — Telephone Encounter (Signed)
New cell is [phone]

## 2011-07-05 NOTE — Telephone Encounter (Signed)
I called the pain med & alprazolam in. Insurance will pay when it is time for a refill. New cell number is [phone]

## 2011-07-05 NOTE — Telephone Encounter (Signed)
I placed a call to her. The meds she wants a refill on are not due until Friday. I will call them in Thursday pm. She could not talk but said she would call me back

## 2011-07-07 ENCOUNTER — Other Ambulatory Visit: Payer: Self-pay | Admitting: Infectious Diseases

## 2011-07-07 DIAGNOSIS — Z1231 Encounter for screening mammogram for malignant neoplasm of breast: Secondary | ICD-10-CM

## 2011-07-16 ENCOUNTER — Other Ambulatory Visit: Payer: Self-pay | Admitting: Infectious Diseases

## 2011-07-16 DIAGNOSIS — R52 Pain, unspecified: Secondary | ICD-10-CM

## 2011-07-22 ENCOUNTER — Ambulatory Visit (INDEPENDENT_AMBULATORY_CARE_PROVIDER_SITE_OTHER): Payer: Medicare Other | Admitting: Advanced Practice Midwife

## 2011-07-22 ENCOUNTER — Encounter: Payer: Self-pay | Admitting: Advanced Practice Midwife

## 2011-07-22 ENCOUNTER — Other Ambulatory Visit (HOSPITAL_COMMUNITY)
Admission: RE | Admit: 2011-07-22 | Discharge: 2011-07-22 | Disposition: A | Discharge status: Home / Self Care | Payer: Medicare Other | Source: Ambulatory Visit | Attending: Advanced Practice Midwife | Admitting: Advanced Practice Midwife

## 2011-07-22 ENCOUNTER — Other Ambulatory Visit: Payer: Self-pay | Admitting: *Deleted

## 2011-07-22 VITALS — BP 114/79 | HR 64 | Temp 98.6°F | Ht 64.0 in | Wt 158.7 lb

## 2011-07-22 DIAGNOSIS — B20 Human immunodeficiency virus [HIV] disease: Secondary | ICD-10-CM

## 2011-07-22 DIAGNOSIS — Z124 Encounter for screening for malignant neoplasm of cervix: Secondary | ICD-10-CM | POA: Insufficient documentation

## 2011-07-22 DIAGNOSIS — Z0000 Encounter for general adult medical examination without abnormal findings: Secondary | ICD-10-CM

## 2011-07-22 DIAGNOSIS — Z01419 Encounter for gynecological examination (general) (routine) without abnormal findings: Secondary | ICD-10-CM | POA: Diagnosis not present

## 2011-07-22 NOTE — Progress Notes (Signed)
  Subjective:     Lori [name] is a 41 y.o. female here for a routine exam.  Current complaints: none.  Personal health questionnaire reviewed: yes.   Gynecologic History No LMP recorded. Patient has had a hysterectomy. Contraception: status post hysterectomy Last Pap: normal, 2009. Results were: normal Last mammogram: years ago, has one scheduled. Results were: normal Hx multiple abnormal paps with Cryotherapy. Obstetric History OB History    Grav Para Term Preterm Abortions TAB SAB Ect Mult Living   7 2 2  5 3 2   2      # Outc Date GA Lbr Len/2nd Wgt Sex Del Anes PTL Lv   1 SAB            2 SAB            3 TAB            4 TAB            5 TAB            6 TRM            7 TRM                The following portions of the patient's history were reviewed and updated as appropriate: allergies, current medications, past family history, past medical history, past social history, past surgical history and problem list.  Review of Systems Pertinent items are noted in HPI.    Objective:    BP 114/79  Pulse 64  Temp(Src) 98.6 F (37 C) (Oral)  Ht 5\' 4"  (1.626 m)  Wt 158 lb 11.2 oz (71.986 kg)  BMI 27.24 kg/m2  General Appearance:    Alert, cooperative, no distress, appears stated age  Head:    Normocephalic, without obvious abnormality, atraumatic  Eyes:      Ears:      Nose:     Throat:     Neck:   Back:     Symmetric, no curvature, ROM normal, no CVA tenderness  Lungs:     respirations unlabored  Chest Wall:    No tenderness or deformity   Heart:    Regular rate and rhythm  Breast Exam:    No tenderness, masses, or nipple abnormality  Abdomen:     Soft, non-tender, bowel sounds active all four quadrants,    no masses, no organomegaly  Genitalia:    Normal female without lesion, discharge or tenderness  Rectal:    Normal tone, normal prostate, no masses or tenderness  Extremities:   Extremities normal, atraumatic, no cyanosis or edema  Pulses:     Skin:    Skin color, texture, turgor normal, no rashes or lesions  Lymph nodes:     Neurologic:   Grossly intact      Assessment:    Healthy female exam.   HIV  Plan:    Education reviewed: menopausal symptoms. Repeat exam in one year or prn Continue care with primary Dr Lori [name] Has mammogram scheduled

## 2011-07-22 NOTE — Patient Instructions (Signed)
Pap Test A Pap test is a sampling of cells from a woman's cervix. The cervix is the opening between the vagina (birth canal) and the uterus (the bottom part of the womb). The cells are scraped from the cervix during a pelvic exam. These cells are then looked at under a microscope to see if the cells are normal or to see if a cancer is developing or there are changes that suggest a cancer will develop. Cervical dysplasia is a condition in which a woman has abnormal changes in the top layer of cells of her cervix. These changes are an early sign that cervical cancer may develop. Pap tests also look for the human papilloma virus (HPV) because it has 4 types that are responsible for 70% of cervical cancer. Infections can also be found during a Pap test such as bacteria, fungus, protozoa and viruses.  Cervical cancer is harder to treat and less likely to have a good outcome if left untreated. Catching the disease at an early stage leads to a better outcome. Since the Pap test was introduced 60 years ago, deaths from cervical cancer have decreased by 70%. Every woman should keep up to date with Pap tests. RISK FACTORS FOR CERVICAL CANCER INCLUDE:   Becoming sexually active before age 18.   Being the daughter of a woman who took diethylstilbestrol (DES) during pregnancy.   Having a sexual partner who has or has had cancer of the penis.   Having a sexual partner whose past partner had cervical cancer or cervical dysplasia (early cell changes which suggest a cancer may develop).   Having a weakened immune system. An example would be HIV or other immunodeficiency disorder.   Having had a sexually transmitted infection such as chlamydia, gonorrhea or HPV.   Having had an abnormal Pap or cancer of the vagina or vulva.   Having had more than one sexual partner.   A history of cervical cancer in a woman's sister or mother.   Not using condoms with new sexual partners.   Smoking.  WHO SHOULD HAVE PAP  TESTS  A Pap test is done to screen for cervical cancer.   The first Pap test should be done at age 21.   Between ages 21 and 29, Pap tests are repeated every 2 years.   Beginning at age 30, you are advised to have a Pap test every 3 years as long as your past 3 Pap tests have been normal.   Some women have medical problems that increase the chance of getting cervical cancer. Talk to your caregiver about these problems. It is especially important to talk to your caregiver if a new problem develops soon after your last Pap test. In these cases, your caregiver may recommend more frequent screening and Pap tests.   The above recommendations are the same for women who have or have not gotten the vaccine for HPV (Human Papillomavirus).   If you had a hysterectomy for a problem that was not a cancer or a condition that could lead to cancer, then you no longer need Pap tests. However, even if you no longer need a Pap test, a regular exam is a good idea to make sure no other problems are starting.    If you are between ages 65 and 70, and you have had normal Pap tests going back 10 years, you no longer need Pap tests. However, even if you no longer need a Pap test, a regular exam is a good idea   to make sure no other problems are starting.    If you have had past treatment for cervical cancer or a condition that could lead to cancer, you need Pap tests and screening for cancer for at least 20 years after your treatment.   If Pap tests have been discontinued, risk factors (such as a new sexual partner) need to be re-assessed to determine if screening should be resumed.   Some women may need screenings more often if they are at high risk for cervical cancer.  PREPARATION FOR A PAP TEST A Pap test should be performed during the weeks before the start of menstruation. Women should not douche or have sexual intercourse for 24 hours before the test. No vaginal creams, diaphragms, or tampons should be  used for 24 hours before the test. To minimize discomfort, a woman should empty her bladder just before the exam. TAKING THE PAP TEST The caregiver will perform a pelvic exam. A metal or plastic instrument (speculum) is placed in the vagina. This is done before your caregiver does a bimanual exam of your internal female organs. This instrument allows your caregiver to see the inside of the vagina and look at the cervix. A small, sterile brush is used to take a sample of cells from the internal opening of the cervix. A small wooden spatula is used to scrape the outside of the cervix. Neither of these two methods to collect cells will cause you pain. These two scrapings are placed on a glass slide or in a small bottle filled with a special liquid. The cells are looked at later under a microscope in a lab. A specialist will look at these cells and determine if the cells are normal. RESULTS OF YOUR PAP TEST  A healthy Pap test shows no abnormal cells or evidence of inflammation.   The presence of abnormally growing cells on the surface of the cervix may be reported as an abnormal Pap test. Different categories of findings are used to describe your Pap test. Your caregiver will go over the importance of these findings with you. The caregiver will then determine what follow-up is needed or when you should have your next pap test.   If you have had two or more abnormal Pap tests:   You may be asked to have a colposcopy. This is a test in which the cervix is viewed with a special lighted microscope.   A cervical tissue sample (biopsy) may also be needed. This involves taking a small tissue sample from the cervix. The sample is looked at under a microscope to find the cause of the abnormal cells. Make sure you find out the results of the Pap test. If you have not received the results within two weeks, contact your caregiver's office for the results. Do not assume everything is normal if you have not heard from  your caregiver or medical facility. It is important to follow up on all of your test results.  Document Released: 08/21/2002 Document Revised: 02/10/2011 Document Reviewed: 10/23/2007 [employer] Patient Information 2012 [employer]. 

## 2011-08-02 ENCOUNTER — Other Ambulatory Visit: Payer: Self-pay | Admitting: Infectious Diseases

## 2011-08-02 DIAGNOSIS — R52 Pain, unspecified: Secondary | ICD-10-CM

## 2011-08-02 DIAGNOSIS — F419 Anxiety disorder, unspecified: Secondary | ICD-10-CM

## 2011-08-03 NOTE — Telephone Encounter (Signed)
I called the xanax & vicodin in

## 2011-08-04 ENCOUNTER — Telehealth: Payer: Self-pay | Admitting: *Deleted

## 2011-08-04 ENCOUNTER — Ambulatory Visit (HOSPITAL_COMMUNITY): Payer: Medicare Other

## 2011-08-04 NOTE — Telephone Encounter (Signed)
Pt left message requesting that Pap results be faxed to [facility] @ [phone].  *Note: this dept is part of [facility] and can access the results in Epic. I left telephone message for Jennet Maduro RN re: Pap result is in Epic.  I also called pt and informed her.

## 2011-08-06 NOTE — Telephone Encounter (Signed)
Received PAP report.

## 2011-08-10 ENCOUNTER — Other Ambulatory Visit: Payer: Self-pay | Admitting: Infectious Diseases

## 2011-08-10 DIAGNOSIS — R52 Pain, unspecified: Secondary | ICD-10-CM

## 2011-08-25 ENCOUNTER — Other Ambulatory Visit: Payer: Medicare Other

## 2011-08-26 ENCOUNTER — Other Ambulatory Visit: Payer: Self-pay | Admitting: Infectious Diseases

## 2011-08-27 ENCOUNTER — Ambulatory Visit (HOSPITAL_COMMUNITY)
Admission: RE | Admit: 2011-08-27 | Discharge: 2011-08-27 | Disposition: A | Discharge status: Home / Self Care | Payer: Medicare Other | Source: Ambulatory Visit | Attending: Infectious Diseases | Admitting: Infectious Diseases

## 2011-08-27 ENCOUNTER — Other Ambulatory Visit: Payer: Medicare Other

## 2011-08-27 DIAGNOSIS — B20 Human immunodeficiency virus [HIV] disease: Secondary | ICD-10-CM | POA: Diagnosis not present

## 2011-08-27 DIAGNOSIS — Z79899 Other long term (current) drug therapy: Secondary | ICD-10-CM

## 2011-08-27 DIAGNOSIS — Z113 Encounter for screening for infections with a predominantly sexual mode of transmission: Secondary | ICD-10-CM | POA: Diagnosis not present

## 2011-08-27 DIAGNOSIS — Z1231 Encounter for screening mammogram for malignant neoplasm of breast: Secondary | ICD-10-CM

## 2011-08-27 LAB — COMPREHENSIVE METABOLIC PANEL
ALT: 19 U/L (ref 0–35)
Alkaline Phosphatase: 73 U/L (ref 39–117)
Creat: 0.8 mg/dL (ref 0.50–1.10)
Glucose, Bld: 81 mg/dL (ref 70–99)
Sodium: 142 mEq/L (ref 135–145)
Total Bilirubin: 0.4 mg/dL (ref 0.3–1.2)
Total Protein: 6.5 g/dL (ref 6.0–8.3)

## 2011-08-27 LAB — CBC
MCH: 33.5 pg (ref 26.0–34.0)
MCHC: 34.8 g/dL (ref 30.0–36.0)
Platelets: 173 10*3/uL (ref 150–400)

## 2011-08-27 LAB — LIPID PANEL
LDL Cholesterol: 150 mg/dL — ABNORMAL HIGH (ref 0–99)
Total CHOL/HDL Ratio: 5.1 Ratio
Triglycerides: 106 mg/dL (ref <150)
VLDL: 21 mg/dL (ref 0–40)

## 2011-08-27 LAB — T-HELPER CELL (CD4) - (RCID CLINIC ONLY): CD4 % Helper T Cell: 27 % — ABNORMAL LOW (ref 33–55)

## 2011-08-31 ENCOUNTER — Other Ambulatory Visit: Payer: Self-pay | Admitting: Infectious Diseases

## 2011-08-31 DIAGNOSIS — F419 Anxiety disorder, unspecified: Secondary | ICD-10-CM

## 2011-08-31 DIAGNOSIS — R52 Pain, unspecified: Secondary | ICD-10-CM

## 2011-08-31 LAB — HIV-1 RNA QUANT-NO REFLEX-BLD: HIV 1 RNA Quant: <20 copies/mL (ref <20)

## 2011-09-01 ENCOUNTER — Other Ambulatory Visit: Payer: Medicare Other

## 2011-09-13 ENCOUNTER — Other Ambulatory Visit: Payer: Self-pay | Admitting: Infectious Diseases

## 2011-09-15 ENCOUNTER — Ambulatory Visit: Payer: Medicare Other | Admitting: Infectious Diseases

## 2011-09-23 ENCOUNTER — Other Ambulatory Visit: Payer: Self-pay | Admitting: Infectious Diseases

## 2011-09-23 DIAGNOSIS — F419 Anxiety disorder, unspecified: Secondary | ICD-10-CM

## 2011-09-23 DIAGNOSIS — R52 Pain, unspecified: Secondary | ICD-10-CM

## 2011-10-05 ENCOUNTER — Telehealth: Payer: Self-pay | Admitting: Licensed Clinical Social Worker

## 2011-10-05 NOTE — Telephone Encounter (Signed)
Patient called stating she wants Augmentin called in for her sinus infection, I explained to her that we normally don't call antibiotics in without the patient being seen. She states that Dr. [name] does it for her she just has to call and ask because she gets them frequently. I will check with Dr. [name] and notify the patient.

## 2011-10-06 ENCOUNTER — Other Ambulatory Visit: Payer: Self-pay | Admitting: *Deleted

## 2011-10-06 DIAGNOSIS — J329 Chronic sinusitis, unspecified: Secondary | ICD-10-CM

## 2011-10-06 MED ORDER — AMOXICILLIN-POT CLAVULANATE 875-125 MG PO TABS: 1.0000 | ORAL_TABLET | Freq: Two times a day (BID) | ORAL | Status: DC

## 2011-10-06 NOTE — Telephone Encounter (Signed)
Per Dr. [name] he okd the Augmentin, I will add to patient's med list from historical and call to patient's pharmacy.  Patient notified. Wendall Mola CMA

## 2011-10-21 ENCOUNTER — Other Ambulatory Visit: Payer: Self-pay | Admitting: Infectious Diseases

## 2011-10-21 DIAGNOSIS — F419 Anxiety disorder, unspecified: Secondary | ICD-10-CM

## 2011-10-21 DIAGNOSIS — R52 Pain, unspecified: Secondary | ICD-10-CM

## 2011-11-17 ENCOUNTER — Other Ambulatory Visit: Payer: Self-pay | Admitting: Infectious Diseases

## 2011-11-17 DIAGNOSIS — B20 Human immunodeficiency virus [HIV] disease: Secondary | ICD-10-CM

## 2011-11-17 DIAGNOSIS — F419 Anxiety disorder, unspecified: Secondary | ICD-10-CM

## 2011-11-17 DIAGNOSIS — R52 Pain, unspecified: Secondary | ICD-10-CM

## 2011-11-17 NOTE — Telephone Encounter (Signed)
I told the pt that her pain & anxiety meds are not due until her appt here 11/23/11. She asked that I call them in & she can get them that am before the md visit. I called & left the rx on the [facility] voice mail emphasizing do not fill until 11/23/11.

## 2011-11-23 ENCOUNTER — Ambulatory Visit (INDEPENDENT_AMBULATORY_CARE_PROVIDER_SITE_OTHER): Payer: Medicare Other | Admitting: Infectious Diseases

## 2011-11-23 ENCOUNTER — Encounter: Payer: Self-pay | Admitting: Infectious Diseases

## 2011-11-23 ENCOUNTER — Other Ambulatory Visit: Payer: Self-pay | Admitting: *Deleted

## 2011-11-23 VITALS — BP 121/81 | HR 62 | Temp 98.3°F | Ht 64.0 in | Wt 154.4 lb

## 2011-11-23 DIAGNOSIS — Z9079 Acquired absence of other genital organ(s): Secondary | ICD-10-CM

## 2011-11-23 DIAGNOSIS — F411 Generalized anxiety disorder: Secondary | ICD-10-CM

## 2011-11-23 DIAGNOSIS — R52 Pain, unspecified: Secondary | ICD-10-CM | POA: Diagnosis not present

## 2011-11-23 DIAGNOSIS — B20 Human immunodeficiency virus [HIV] disease: Secondary | ICD-10-CM | POA: Diagnosis not present

## 2011-11-23 DIAGNOSIS — Z87898 Personal history of other specified conditions: Secondary | ICD-10-CM

## 2011-11-23 MED ORDER — ACETAMINOPHEN-CODEINE #3 300-30 MG PO TABS: 2.0000 | ORAL_TABLET | ORAL | Status: DC | PRN

## 2011-11-23 NOTE — Assessment & Plan Note (Signed)
She is going to try to wean herself off her xanax.

## 2011-11-23 NOTE — Assessment & Plan Note (Signed)
Her tylenol 3 is refilled.

## 2011-11-23 NOTE — Progress Notes (Signed)
  Subjective:    Patient ID: Lori [name], female    DOB: 06-May-1971, 41 y.o.   MRN: [id number]  HPI 41 yo F with hx of HIV+, on atripla, anxiety d/o. Has had decreased apetite. Has lost 16# this spring. Attributes to stress somewhat. Also having abd pain with eating which has since resolved.  Had red spots on back of throat, like blisters.    HIV 1 RNA Quant (copies/mL)  Date Value  08/27/2011 <20   02/23/2011 <20   04/20/2010 <20 copies/mL      CD4 T Cell Abs (cmm)  Date Value  08/27/2011 600   02/23/2011 770   04/20/2010 620    Lab Results  Component Value Date   CHOL 213* 08/27/2011   HDL 42 08/27/2011   LDLCALC 150* 08/27/2011   TRIG 106 08/27/2011   CHOLHDL 5.1 08/27/2011        Review of Systems     Objective:   Physical Exam  Constitutional: She appears well-developed and well-nourished.  Eyes: EOM are normal. Pupils are equal, round, and reactive to light.  Neck: Neck supple.  Cardiovascular: Normal rate, regular rhythm and normal heart sounds.   Pulmonary/Chest: Effort normal and breath sounds normal.  Abdominal: Soft. Bowel sounds are normal. She exhibits no distension. There is no tenderness.  Lymphadenopathy:    She has no cervical adenopathy.          Assessment & Plan:

## 2011-11-23 NOTE — Assessment & Plan Note (Signed)
She is doing well on her current art. Will set her up for PAP with denise. She is offered condoms, refuses. Will see her back in 6 months.

## 2011-11-23 NOTE — Assessment & Plan Note (Signed)
Will f/u with Lori [name] for her PAP.

## 2011-12-17 ENCOUNTER — Other Ambulatory Visit: Payer: Self-pay | Admitting: Infectious Diseases

## 2011-12-20 ENCOUNTER — Other Ambulatory Visit: Payer: Self-pay | Admitting: Licensed Clinical Social Worker

## 2011-12-20 DIAGNOSIS — F419 Anxiety disorder, unspecified: Secondary | ICD-10-CM

## 2011-12-20 MED ORDER — ALPRAZOLAM 2 MG PO TABS
2.0000 mg | ORAL_TABLET | Freq: Two times a day (BID) | ORAL | Status: DC
Start: 2011-12-20 — End: 2012-04-10

## 2012-01-13 ENCOUNTER — Other Ambulatory Visit: Payer: Self-pay | Admitting: Infectious Diseases

## 2012-01-13 DIAGNOSIS — B20 Human immunodeficiency virus [HIV] disease: Secondary | ICD-10-CM

## 2012-01-13 DIAGNOSIS — G43909 Migraine, unspecified, not intractable, without status migrainosus: Secondary | ICD-10-CM

## 2012-02-09 ENCOUNTER — Other Ambulatory Visit: Payer: Self-pay | Admitting: Infectious Diseases

## 2012-02-10 ENCOUNTER — Other Ambulatory Visit: Payer: Self-pay | Admitting: Licensed Clinical Social Worker

## 2012-02-10 DIAGNOSIS — G43909 Migraine, unspecified, not intractable, without status migrainosus: Secondary | ICD-10-CM

## 2012-02-10 MED ORDER — ACETAMINOPHEN-CODEINE #3 300-30 MG PO TABS: 2.0000 | ORAL_TABLET | ORAL | Status: DC | PRN

## 2012-03-09 ENCOUNTER — Other Ambulatory Visit: Payer: Self-pay | Admitting: Infectious Diseases

## 2012-03-10 ENCOUNTER — Other Ambulatory Visit: Payer: Self-pay | Admitting: *Deleted

## 2012-03-10 DIAGNOSIS — G43909 Migraine, unspecified, not intractable, without status migrainosus: Secondary | ICD-10-CM

## 2012-03-10 MED ORDER — ACETAMINOPHEN-CODEINE #3 300-30 MG PO TABS: 2.0000 | ORAL_TABLET | ORAL | Status: DC | PRN

## 2012-04-09 ENCOUNTER — Other Ambulatory Visit: Payer: Self-pay | Admitting: Infectious Diseases

## 2012-04-10 ENCOUNTER — Other Ambulatory Visit: Payer: Self-pay | Admitting: *Deleted

## 2012-04-10 DIAGNOSIS — F419 Anxiety disorder, unspecified: Secondary | ICD-10-CM

## 2012-04-10 DIAGNOSIS — G43909 Migraine, unspecified, not intractable, without status migrainosus: Secondary | ICD-10-CM

## 2012-04-10 MED ORDER — ACETAMINOPHEN-CODEINE #3 300-30 MG PO TABS: 2.0000 | ORAL_TABLET | ORAL | Status: DC | PRN

## 2012-04-10 MED ORDER — ALPRAZOLAM 2 MG PO TABS: 2.0000 mg | ORAL_TABLET | Freq: Two times a day (BID) | ORAL | Status: DC

## 2012-04-26 ENCOUNTER — Telehealth: Payer: Self-pay | Admitting: *Deleted

## 2012-04-26 DIAGNOSIS — S299XXA Unspecified injury of thorax, initial encounter: Secondary | ICD-10-CM
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
## 2012-04-27 ENCOUNTER — Ambulatory Visit (INDEPENDENT_AMBULATORY_CARE_PROVIDER_SITE_OTHER): Payer: Medicare Other | Admitting: Infectious Diseases

## 2012-04-27 ENCOUNTER — Ambulatory Visit
Admission: RE | Admit: 2012-04-27 | Discharge: 2012-04-27 | Disposition: A | Discharge status: Home / Self Care | Payer: Medicare Other | Source: Ambulatory Visit | Attending: Infectious Diseases | Admitting: Infectious Diseases

## 2012-04-27 ENCOUNTER — Encounter: Payer: Self-pay | Admitting: Infectious Diseases

## 2012-04-27 VITALS — BP 137/83 | HR 71 | Temp 98.9°F | Ht 64.0 in | Wt 173.0 lb

## 2012-04-27 DIAGNOSIS — S299XXA Unspecified injury of thorax, initial encounter: Secondary | ICD-10-CM

## 2012-04-27 DIAGNOSIS — R079 Chest pain, unspecified: Secondary | ICD-10-CM | POA: Diagnosis not present

## 2012-04-27 DIAGNOSIS — T07XXXA Unspecified multiple injuries, initial encounter: Secondary | ICD-10-CM | POA: Insufficient documentation

## 2012-04-27 DIAGNOSIS — S298XXA Other specified injuries of thorax, initial encounter: Secondary | ICD-10-CM | POA: Diagnosis not present

## 2012-04-27 DIAGNOSIS — Z23 Encounter for immunization: Secondary | ICD-10-CM | POA: Diagnosis not present

## 2012-04-27 DIAGNOSIS — B20 Human immunodeficiency virus [HIV] disease: Secondary | ICD-10-CM

## 2012-04-27 DIAGNOSIS — A630 Anogenital (venereal) warts: Secondary | ICD-10-CM

## 2012-04-27 LAB — CBC WITH DIFFERENTIAL/PLATELET
Basophils Absolute: 0 10*3/uL (ref 0.0–0.1)
Basophils Relative: 1 % (ref 0–1)
HCT: 36.7 % (ref 36.0–46.0)
Hemoglobin: 12.8 g/dL (ref 12.0–15.0)
Lymphocytes Relative: 33 % (ref 12–46)
Monocytes Absolute: 0.4 10*3/uL (ref 0.1–1.0)
Monocytes Relative: 7 % (ref 3–12)
Neutro Abs: 3.8 10*3/uL (ref 1.7–7.7)
Neutrophils Relative %: 57 % (ref 43–77)
WBC: 6.6 10*3/uL (ref 4.0–10.5)

## 2012-04-27 MED ORDER — OXYCODONE-ACETAMINOPHEN 10-325 MG PO TABS: 1.0000 | ORAL_TABLET | ORAL | Status: DC | PRN

## 2012-04-27 NOTE — Assessment & Plan Note (Signed)
Taking her ART well, will check her labs today.
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
## 2012-04-27 NOTE — Addendum Note (Signed)
Addended by: Mariea Clonts D on: 04/27/2012 03:29 PM   Modules accepted: Orders

## 2012-04-27 NOTE — Assessment & Plan Note (Signed)
Normal PAP this year.
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
## 2012-04-28 LAB — COMPREHENSIVE METABOLIC PANEL
ALT: 36 U/L — ABNORMAL HIGH (ref 0–35)
AST: 27 U/L (ref 0–37)
Albumin: 4.5 g/dL (ref 3.5–5.2)
BUN: 10 mg/dL (ref 6–23)
CO2: 29 mEq/L (ref 19–32)
Calcium: 9.1 mg/dL (ref 8.4–10.5)
Chloride: 106 mEq/L (ref 96–112)
Potassium: 3.4 mEq/L — ABNORMAL LOW (ref 3.5–5.3)

## 2012-04-28 LAB — T-HELPER CELL (CD4) - (RCID CLINIC ONLY): CD4 T Cell Abs: 750 uL (ref 400–2700)

## 2012-04-30 LAB — HIV-1 RNA QUANT-NO REFLEX-BLD
HIV 1 RNA Quant: <20 copies/mL (ref <20)
HIV-1 RNA Quant, Log: <1.3 {Log} (ref <1.30)

## 2012-05-01 ENCOUNTER — Telehealth: Payer: Self-pay | Admitting: *Deleted
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
## 2012-05-04 ENCOUNTER — Other Ambulatory Visit: Payer: Self-pay | Admitting: Infectious Diseases

## 2012-05-08 ENCOUNTER — Other Ambulatory Visit: Payer: Self-pay | Admitting: Infectious Diseases

## 2012-05-09 ENCOUNTER — Other Ambulatory Visit: Payer: Self-pay | Admitting: *Deleted

## 2012-05-09 DIAGNOSIS — G43909 Migraine, unspecified, not intractable, without status migrainosus: Secondary | ICD-10-CM

## 2012-05-09 MED ORDER — ACETAMINOPHEN-CODEINE #3 300-30 MG PO TABS: 2.0000 | ORAL_TABLET | ORAL | Status: DC | PRN

## 2012-05-10 ENCOUNTER — Other Ambulatory Visit: Payer: Medicare Other

## 2012-05-24 ENCOUNTER — Encounter: Payer: Self-pay | Admitting: Infectious Diseases

## 2012-05-24 ENCOUNTER — Ambulatory Visit (INDEPENDENT_AMBULATORY_CARE_PROVIDER_SITE_OTHER): Payer: Medicare Other | Admitting: Infectious Diseases

## 2012-05-24 VITALS — BP 133/87 | HR 64 | Temp 98.1°F | Ht 64.0 in | Wt 170.5 lb

## 2012-05-24 DIAGNOSIS — B20 Human immunodeficiency virus [HIV] disease: Secondary | ICD-10-CM

## 2012-05-24 DIAGNOSIS — F17200 Nicotine dependence, unspecified, uncomplicated: Secondary | ICD-10-CM

## 2012-05-24 DIAGNOSIS — A630 Anogenital (venereal) warts: Secondary | ICD-10-CM | POA: Diagnosis not present

## 2012-05-24 NOTE — Assessment & Plan Note (Signed)
She knows that she needs to quit smoking.
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
## 2012-05-24 NOTE — Assessment & Plan Note (Signed)
Will try to get her in for HRA, she states she has anal and vaginal lesions.
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
## 2012-05-29 ENCOUNTER — Telehealth: Payer: Self-pay | Admitting: Licensed Clinical Social Worker

## 2012-05-29 NOTE — Telephone Encounter (Signed)
Patient called stating that she fell on some ice yesterday and thinks her "rib popped" she wanted a refill on percocet. I advised her to have an x ray and visit, she declined. I advised her that if she is still having pain that she can make an appointment.
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
## 2012-06-01 ENCOUNTER — Other Ambulatory Visit: Payer: Self-pay | Admitting: Infectious Diseases

## 2012-06-09 ENCOUNTER — Other Ambulatory Visit: Payer: Self-pay | Admitting: *Deleted

## 2012-06-09 DIAGNOSIS — G43909 Migraine, unspecified, not intractable, without status migrainosus: Secondary | ICD-10-CM

## 2012-06-09 MED ORDER — ACETAMINOPHEN-CODEINE #3 300-30 MG PO TABS: 2.0000 | ORAL_TABLET | ORAL | Status: DC | PRN

## 2012-06-16 ENCOUNTER — Other Ambulatory Visit: Payer: Self-pay | Admitting: Infectious Diseases

## 2012-06-30 ENCOUNTER — Other Ambulatory Visit: Payer: Self-pay | Admitting: Infectious Diseases

## 2012-07-07 ENCOUNTER — Other Ambulatory Visit: Payer: Self-pay | Admitting: Infectious Diseases

## 2012-07-10 ENCOUNTER — Other Ambulatory Visit: Payer: Self-pay | Admitting: *Deleted

## 2012-07-10 DIAGNOSIS — G43909 Migraine, unspecified, not intractable, without status migrainosus: Secondary | ICD-10-CM

## 2012-07-10 MED ORDER — ACETAMINOPHEN-CODEINE #3 300-30 MG PO TABS: 2.0000 | ORAL_TABLET | ORAL | Status: DC | PRN

## 2012-07-28 ENCOUNTER — Other Ambulatory Visit: Payer: Self-pay | Admitting: Infectious Diseases

## 2012-08-02 ENCOUNTER — Telehealth: Payer: Self-pay | Admitting: *Deleted

## 2012-08-02 MED ORDER — ALPRAZOLAM 2 MG PO TABS: 2.0000 mg | ORAL_TABLET | Freq: Two times a day (BID) | ORAL | Status: DC

## 2012-08-02 NOTE — Addendum Note (Signed)
Addended by: Jennet Maduro D on: 08/02/2012 11:51 AM   Modules accepted: Orders

## 2012-08-02 NOTE — Telephone Encounter (Signed)
Ok to refill, thanks

## 2012-08-02 NOTE — Telephone Encounter (Signed)
Pt requesting Xanax refill.  Previously refilled 04/10/12 #60 w/ 3 refills.   Pt taking BID.  MD, OK to refill as written?  Please advise.

## 2012-08-07 ENCOUNTER — Other Ambulatory Visit: Payer: Self-pay | Admitting: Infectious Diseases

## 2012-08-07 NOTE — Telephone Encounter (Signed)
H/O abnormal pap smear leading to hysterectomy.  Pt stated that she would call [facility] to obtain appt and set up mammogram at [facility] on the same day.

## 2012-08-28 ENCOUNTER — Other Ambulatory Visit: Payer: Self-pay | Admitting: Infectious Diseases

## 2012-08-28 DIAGNOSIS — B20 Human immunodeficiency virus [HIV] disease: Secondary | ICD-10-CM

## 2012-09-04 ENCOUNTER — Other Ambulatory Visit: Payer: Self-pay | Admitting: Infectious Diseases

## 2012-09-04 ENCOUNTER — Other Ambulatory Visit: Payer: Self-pay | Admitting: *Deleted

## 2012-09-13 ENCOUNTER — Ambulatory Visit (HOSPITAL_COMMUNITY)
Admission: RE | Admit: 2012-09-13 | Discharge: 2012-09-13 | Disposition: A | Discharge status: Home / Self Care | Payer: Medicare Other | Source: Ambulatory Visit | Attending: Infectious Diseases | Admitting: Infectious Diseases

## 2012-09-13 ENCOUNTER — Ambulatory Visit (INDEPENDENT_AMBULATORY_CARE_PROVIDER_SITE_OTHER): Payer: Medicare Other | Admitting: Obstetrics and Gynecology

## 2012-09-13 ENCOUNTER — Other Ambulatory Visit (HOSPITAL_COMMUNITY)
Admission: RE | Admit: 2012-09-13 | Discharge: 2012-09-13 | Disposition: A | Discharge status: Home / Self Care | Payer: Medicare Other | Source: Ambulatory Visit | Attending: Obstetrics and Gynecology | Admitting: Obstetrics and Gynecology

## 2012-09-13 ENCOUNTER — Encounter: Payer: Self-pay | Admitting: Obstetrics and Gynecology

## 2012-09-13 VITALS — BP 124/83 | HR 71 | Temp 98.9°F | Ht 64.0 in | Wt 168.2 lb

## 2012-09-13 DIAGNOSIS — Z01419 Encounter for gynecological examination (general) (routine) without abnormal findings: Secondary | ICD-10-CM | POA: Diagnosis not present

## 2012-09-13 DIAGNOSIS — Z124 Encounter for screening for malignant neoplasm of cervix: Secondary | ICD-10-CM | POA: Insufficient documentation

## 2012-09-13 DIAGNOSIS — Z1151 Encounter for screening for human papillomavirus (HPV): Secondary | ICD-10-CM | POA: Insufficient documentation

## 2012-09-13 DIAGNOSIS — Z1231 Encounter for screening mammogram for malignant neoplasm of breast: Secondary | ICD-10-CM | POA: Diagnosis not present

## 2012-09-13 NOTE — Patient Instructions (Signed)
Preventive Care for Adults, Female A healthy lifestyle and preventive care can promote health and wellness. Preventive health guidelines for women include the following key practices.  A routine yearly physical is a good way to check with your caregiver about your health and preventive screening. It is a chance to share any concerns and updates on your health, and to receive a thorough exam.  Visit your dentist for a routine exam and preventive care every 6 months. Brush your teeth twice a day and floss once a day. Good oral hygiene prevents tooth decay and gum disease.  The frequency of eye exams is based on your age, health, family medical history, use of contact lenses, and other factors. Follow your caregiver's recommendations for frequency of eye exams.  Eat a healthy diet. Foods like vegetables, fruits, whole grains, low-fat dairy products, and lean protein foods contain the nutrients you need without too many calories. Decrease your intake of foods high in solid fats, added sugars, and salt. Eat the right amount of calories for you.Get information about a proper diet from your caregiver, if necessary.  Regular physical exercise is one of the most important things you can do for your health. Most adults should get at least 150 minutes of moderate-intensity exercise (any activity that increases your heart rate and causes you to sweat) each week. In addition, most adults need muscle-strengthening exercises on 2 or more days a week.  Maintain a healthy weight. The body mass index (BMI) is a screening tool to identify possible weight problems. It provides an estimate of body fat based on height and weight. Your caregiver can help determine your BMI, and can help you achieve or maintain a healthy weight.For adults 20 years and older:  A BMI below 18.5 is considered underweight.  A BMI of 18.5 to 24.9 is normal.  A BMI of 25 to 29.9 is considered overweight.  A BMI of 30 and above is  considered obese.  Maintain normal blood lipids and cholesterol levels by exercising and minimizing your intake of saturated fat. Eat a balanced diet with plenty of fruit and vegetables. Blood tests for lipids and cholesterol should begin at age 20 and be repeated every 5 years. If your lipid or cholesterol levels are high, you are over 50, or you are at high risk for heart disease, you may need your cholesterol levels checked more frequently.Ongoing high lipid and cholesterol levels should be treated with medicines if diet and exercise are not effective.  If you smoke, find out from your caregiver how to quit. If you do not use tobacco, do not start.  If you are pregnant, do not drink alcohol. If you are breastfeeding, be very cautious about drinking alcohol. If you are not pregnant and choose to drink alcohol, do not exceed 1 drink per day. One drink is considered to be 12 ounces (355 mL) of beer, 5 ounces (148 mL) of wine, or 1.5 ounces (44 mL) of liquor.  Avoid use of street drugs. Do not share needles with anyone. Ask for help if you need support or instructions about stopping the use of drugs.  High blood pressure causes heart disease and increases the risk of stroke. Your blood pressure should be checked at least every 1 to 2 years. Ongoing high blood pressure should be treated with medicines if weight loss and exercise are not effective.  If you are 55 to 42 years old, ask your caregiver if you should take aspirin to prevent strokes.  Diabetes   screening involves taking a blood sample to check your fasting blood sugar level. This should be done once every 3 years, after age 45, if you are within normal weight and without risk factors for diabetes. Testing should be considered at a younger age or be carried out more frequently if you are overweight and have at least 1 risk factor for diabetes.  Breast cancer screening is essential preventive care for women. You should practice "breast  self-awareness." This means understanding the normal appearance and feel of your breasts and may include breast self-examination. Any changes detected, no matter how small, should be reported to a caregiver. Women in their 20s and 30s should have a clinical breast exam (CBE) by a caregiver as part of a regular health exam every 1 to 3 years. After age 40, women should have a CBE every year. Starting at age 40, women should consider having a mammography (breast X-ray test) every year. Women who have a family history of breast cancer should talk to their caregiver about genetic screening. Women at a high risk of breast cancer should talk to their caregivers about having magnetic resonance imaging (MRI) and a mammography every year.  The Pap test is a screening test for cervical cancer. A Pap test can show cell changes on the cervix that might become cervical cancer if left untreated. A Pap test is a procedure in which cells are obtained and examined from the lower end of the uterus (cervix).  Women should have a Pap test starting at age 21.  Between ages 21 and 29, Pap tests should be repeated every 2 years.  Beginning at age 30, you should have a Pap test every 3 years as long as the past 3 Pap tests have been normal.  Some women have medical problems that increase the chance of getting cervical cancer. Talk to your caregiver about these problems. It is especially important to talk to your caregiver if a new problem develops soon after your last Pap test. In these cases, your caregiver may recommend more frequent screening and Pap tests.  The above recommendations are the same for women who have or have not gotten the vaccine for human papillomavirus (HPV).  If you had a hysterectomy for a problem that was not cancer or a condition that could lead to cancer, then you no longer need Pap tests. Even if you no longer need a Pap test, a regular exam is a good idea to make sure no other problems are  starting.  If you are between ages 65 and 70, and you have had normal Pap tests going back 10 years, you no longer need Pap tests. Even if you no longer need a Pap test, a regular exam is a good idea to make sure no other problems are starting.  If you have had past treatment for cervical cancer or a condition that could lead to cancer, you need Pap tests and screening for cancer for at least 20 years after your treatment.  If Pap tests have been discontinued, risk factors (such as a new sexual partner) need to be reassessed to determine if screening should be resumed.  The HPV test is an additional test that may be used for cervical cancer screening. The HPV test looks for the virus that can cause the cell changes on the cervix. The cells collected during the Pap test can be tested for HPV. The HPV test could be used to screen women aged 30 years and older, and should   be used in women of any age who have unclear Pap test results. After the age of 30, women should have HPV testing at the same frequency as a Pap test.  Colorectal cancer can be detected and often prevented. Most routine colorectal cancer screening begins at the age of 50 and continues through age 75. However, your caregiver may recommend screening at an earlier age if you have risk factors for colon cancer. On a yearly basis, your caregiver may provide home test kits to check for hidden blood in the stool. Use of a small camera at the end of a tube, to directly examine the colon (sigmoidoscopy or colonoscopy), can detect the earliest forms of colorectal cancer. Talk to your caregiver about this at age 50, when routine screening begins. Direct examination of the colon should be repeated every 5 to 10 years through age 75, unless early forms of pre-cancerous polyps or small growths are found.  Hepatitis C blood testing is recommended for all people born from 1945 through 1965 and any individual with known risks for hepatitis C.  Practice  safe sex. Use condoms and avoid high-risk sexual practices to reduce the spread of sexually transmitted infections (STIs). STIs include gonorrhea, chlamydia, syphilis, trichomonas, herpes, HPV, and human immunodeficiency virus (HIV). Herpes, HIV, and HPV are viral illnesses that have no cure. They can result in disability, cancer, and death. Sexually active women aged 25 and younger should be checked for chlamydia. Older women with new or multiple partners should also be tested for chlamydia. Testing for other STIs is recommended if you are sexually active and at increased risk.  Osteoporosis is a disease in which the bones lose minerals and strength with aging. This can result in serious bone fractures. The risk of osteoporosis can be identified using a bone density scan. Women ages 65 and over and women at risk for fractures or osteoporosis should discuss screening with their caregivers. Ask your caregiver whether you should take a calcium supplement or vitamin D to reduce the rate of osteoporosis.  Menopause can be associated with physical symptoms and risks. Hormone replacement therapy is available to decrease symptoms and risks. You should talk to your caregiver about whether hormone replacement therapy is right for you.  Use sunscreen with sun protection factor (SPF) of 30 or more. Apply sunscreen liberally and repeatedly throughout the day. You should seek shade when your shadow is shorter than you. Protect yourself by wearing long sleeves, pants, a wide-brimmed hat, and sunglasses year round, whenever you are outdoors.  Once a month, do a whole body skin exam, using a mirror to look at the skin on your back. Notify your caregiver of new moles, moles that have irregular borders, moles that are larger than a pencil eraser, or moles that have changed in shape or color.  Stay current with required immunizations.  Influenza. You need a dose every fall (or winter). The composition of the flu vaccine  changes each year, so being vaccinated once is not enough.  Pneumococcal polysaccharide. You need 1 to 2 doses if you smoke cigarettes or if you have certain chronic medical conditions. You need 1 dose at age 65 (or older) if you have never been vaccinated.  Tetanus, diphtheria, pertussis (Tdap, Td). Get 1 dose of Tdap vaccine if you are younger than age 65, are over 65 and have contact with an infant, are a healthcare worker, are pregnant, or simply want to be protected from whooping cough. After that, you need a Td   booster dose every 10 years. Consult your caregiver if you have not had at least 3 tetanus and diphtheria-containing shots sometime in your life or have a deep or dirty wound.  HPV. You need this vaccine if you are a woman age 26 or younger. The vaccine is given in 3 doses over 6 months.  Measles, mumps, rubella (MMR). You need at least 1 dose of MMR if you were born in 1957 or later. You may also need a second dose.  Meningococcal. If you are age 19 to 21 and a first-year college student living in a residence hall, or have one of several medical conditions, you need to get vaccinated against meningococcal disease. You may also need additional booster doses.  Zoster (shingles). If you are age 60 or older, you should get this vaccine.  Varicella (chickenpox). If you have never had chickenpox or you were vaccinated but received only 1 dose, talk to your caregiver to find out if you need this vaccine.  Hepatitis A. You need this vaccine if you have a specific risk factor for hepatitis A virus infection or you simply wish to be protected from this disease. The vaccine is usually given as 2 doses, 6 to 18 months apart.  Hepatitis B. You need this vaccine if you have a specific risk factor for hepatitis B virus infection or you simply wish to be protected from this disease. The vaccine is given in 3 doses, usually over 6 months. Preventive Services / Frequency Ages 40 to 64  Blood  pressure check.** / Every 1 to 2 years.  Lipid and cholesterol check.** / Every 5 years beginning at age 20.  Clinical breast exam.** / Every year after age 40.  Mammogram.** / Every year beginning at age 40 and continuing for as long as you are in good health. Consult with your caregiver.  Pap test.** / Every 3 years starting at age 30 through age 65 or 70 with a history of 3 consecutive normal Pap tests.  HPV screening.** / Every 3 years from ages 30 through ages 65 to 70 with a history of 3 consecutive normal Pap tests.  Fecal occult blood test (FOBT) of stool. / Every year beginning at age 50 and continuing until age 75. You may not need to do this test if you get a colonoscopy every 10 years.  Flexible sigmoidoscopy or colonoscopy.** / Every 5 years for a flexible sigmoidoscopy or every 10 years for a colonoscopy beginning at age 50 and continuing until age 75.  Hepatitis C blood test.** / For all people born from 1945 through 1965 and any individual with known risks for hepatitis C.  Skin self-exam. / Monthly.  Influenza immunization.** / Every year.  Pneumococcal polysaccharide immunization.** / 1 to 2 doses if you smoke cigarettes or if you have certain chronic medical conditions.  Tetanus, diphtheria, pertussis (Tdap, Td) immunization.** / A one-time dose of Tdap vaccine. After that, you need a Td booster dose every 10 years.  Measles, mumps, rubella (MMR) immunization. / You need at least 1 dose of MMR if you were born in 1957 or later. You may also need a second dose.  Varicella immunization.** / Consult your caregiver.  Meningococcal immunization.** / Consult your caregiver.  Hepatitis A immunization.** / Consult your caregiver. 2 doses, 6 to 18 months apart.  Hepatitis B immunization.** / Consult your caregiver. 3 doses, usually over 6 months. ** Family history and personal history of risk and conditions may change your caregiver's recommendations.   Document Released:  07/27/2001 Document Revised: 08/23/2011 Document Reviewed: 10/26/2010 [employer] Patient Information 2013 [employer].  

## 2012-09-13 NOTE — Progress Notes (Signed)
  Subjective:     Lori [name] is a 42 y.o. female [phone] who is here for a comprehensive physical exam. The patient reports no problems. Patient is HIV positive. She has a history of abnormal pap smear treated with cryotherapy, LEEP and hysterectomy. Patient is concerned about possible genital warts. She has found small bumps that have not changed in size over the past 2 weeks.  History   Social History  . Marital Status: Single    Spouse Name: N/A    Number of Children: N/A  . Years of Education: N/A   Occupational History  . Not on file.   Social History Main Topics  . Smoking status: Current Every Day Smoker -- 1.00 packs/day for 27 years    Types: Cigarettes  . Smokeless tobacco: Never Used  . Alcohol Use: Yes     Comment: occasional  . Drug Use: 7.00 per week    Special: Marijuana  . Sexually Active: Yes    Birth Control/ Protection: Surgical, Condom     Comment: pt. declined condoms   Other Topics Concern  . Not on file   Social History Narrative  . No narrative on file   Health Maintenance  Topic Date Due  . Tetanus/tdap  12/20/1989  . Pap Smear  07/21/2012  . Influenza Vaccine  02/12/2013       Review of Systems A comprehensive review of systems was negative.   Objective:      GENERAL: Well-developed, well-nourished female in no acute distress.  HEENT: Normocephalic, atraumatic. Sclerae anicteric.  NECK: Supple. Normal thyroid.  LUNGS: Clear to auscultation bilaterally.  HEART: Regular rate and rhythm. BREASTS: Symmetric in size. No palpable masses or lymphadenopathy, skin changes, or nipple drainage. ABDOMEN: Soft, nontender, nondistended. No organomegaly. PELVIC: Normal external female genitalia. Vagina is pink and rugated.  Normal vaginal vault. No adnexal mass or tenderness. Small mole-like lesion that are 2 mm in size EXTREMITIES: No cyanosis, clubbing, or edema, 2+ distal pulses.    Assessment:    Healthy female exam.      Plan:     Vaginal pap smear performed Patient with h/o abnormal pap smear s/p cryo and LEEP. Patient had a hysterectomy secondary to persistent abnormal pap smear Patient had screening mammography this morning Patient will be contacted with any abnormal results See After Visit Summary for Counseling Recommendations

## 2012-09-14 ENCOUNTER — Encounter: Payer: Self-pay | Admitting: *Deleted

## 2012-10-03 ENCOUNTER — Other Ambulatory Visit: Payer: Self-pay | Admitting: Infectious Diseases

## 2012-10-03 NOTE — Telephone Encounter (Signed)
Should we refill these two drugs?

## 2012-10-03 NOTE — Telephone Encounter (Signed)
Ok, thanks.

## 2012-11-01 ENCOUNTER — Other Ambulatory Visit: Payer: Self-pay | Admitting: Infectious Diseases

## 2012-11-07 ENCOUNTER — Other Ambulatory Visit: Payer: Self-pay | Admitting: *Deleted

## 2012-11-07 DIAGNOSIS — M25562 Pain in left knee: Secondary | ICD-10-CM

## 2012-11-07 MED ORDER — ACETAMINOPHEN-CODEINE #3 300-30 MG PO TABS: 2.0000 | ORAL_TABLET | ORAL | Status: DC | PRN

## 2012-11-14 ENCOUNTER — Other Ambulatory Visit: Payer: Self-pay | Admitting: Infectious Diseases

## 2012-11-20 ENCOUNTER — Other Ambulatory Visit: Payer: Self-pay | Admitting: Licensed Clinical Social Worker

## 2012-11-20 DIAGNOSIS — F419 Anxiety disorder, unspecified: Secondary | ICD-10-CM

## 2012-11-20 MED ORDER — ALPRAZOLAM 2 MG PO TABS: 2.0000 mg | ORAL_TABLET | Freq: Two times a day (BID) | ORAL | Status: DC

## 2012-11-22 ENCOUNTER — Other Ambulatory Visit: Payer: Medicare Other

## 2012-12-06 ENCOUNTER — Ambulatory Visit: Payer: Medicare Other | Admitting: Infectious Diseases

## 2012-12-06 ENCOUNTER — Other Ambulatory Visit: Payer: Medicare Other

## 2012-12-11 ENCOUNTER — Other Ambulatory Visit: Payer: Self-pay | Admitting: *Deleted

## 2012-12-11 DIAGNOSIS — M25562 Pain in left knee: Secondary | ICD-10-CM

## 2012-12-11 MED ORDER — ACETAMINOPHEN-CODEINE #3 300-30 MG PO TABS: 2.0000 | ORAL_TABLET | ORAL | Status: DC | PRN

## 2012-12-13 ENCOUNTER — Other Ambulatory Visit: Payer: Medicare Other

## 2012-12-13 ENCOUNTER — Other Ambulatory Visit (HOSPITAL_COMMUNITY)
Admission: RE | Admit: 2012-12-13 | Discharge: 2012-12-13 | Disposition: A | Discharge status: Home / Self Care | Payer: Medicare Other | Source: Ambulatory Visit | Attending: Internal Medicine | Admitting: Internal Medicine

## 2012-12-13 DIAGNOSIS — Z113 Encounter for screening for infections with a predominantly sexual mode of transmission: Secondary | ICD-10-CM | POA: Insufficient documentation

## 2012-12-13 DIAGNOSIS — Z79899 Other long term (current) drug therapy: Secondary | ICD-10-CM | POA: Diagnosis not present

## 2012-12-13 DIAGNOSIS — B20 Human immunodeficiency virus [HIV] disease: Secondary | ICD-10-CM

## 2012-12-13 LAB — CBC WITH DIFFERENTIAL/PLATELET
Basophils Absolute: 0 10*3/uL (ref 0.0–0.1)
Basophils Relative: 1 % (ref 0–1)
Eosinophils Absolute: 0.1 10*3/uL (ref 0.0–0.7)
Lymphs Abs: 2.2 10*3/uL (ref 0.7–4.0)
MCH: 32.8 pg (ref 26.0–34.0)
Neutrophils Relative %: 54 % (ref 43–77)
Platelets: 200 10*3/uL (ref 150–400)
RBC: 4.09 MIL/uL (ref 3.87–5.11)
RDW: 14 % (ref 11.5–15.5)

## 2012-12-13 LAB — COMPREHENSIVE METABOLIC PANEL
ALT: 14 U/L (ref 0–35)
AST: 18 U/L (ref 0–37)
Alkaline Phosphatase: 70 U/L (ref 39–117)
CO2: 27 mEq/L (ref 19–32)
Creat: 0.85 mg/dL (ref 0.50–1.10)
Sodium: 138 mEq/L (ref 135–145)
Total Bilirubin: 0.3 mg/dL (ref 0.3–1.2)
Total Protein: 6.4 g/dL (ref 6.0–8.3)

## 2012-12-13 LAB — LIPID PANEL
Cholesterol: 209 mg/dL — ABNORMAL HIGH (ref 0–200)
HDL: 37 mg/dL — ABNORMAL LOW (ref >39)
LDL Cholesterol: 144 mg/dL — ABNORMAL HIGH (ref 0–99)
Total CHOL/HDL Ratio: 5.6 Ratio
Triglycerides: 141 mg/dL (ref <150)
VLDL: 28 mg/dL (ref 0–40)

## 2012-12-14 LAB — T-HELPER CELL (CD4) - (RCID CLINIC ONLY): CD4 % Helper T Cell: 31 % — ABNORMAL LOW (ref 33–55)

## 2012-12-18 ENCOUNTER — Other Ambulatory Visit: Payer: Self-pay | Admitting: *Deleted

## 2012-12-18 DIAGNOSIS — B20 Human immunodeficiency virus [HIV] disease: Secondary | ICD-10-CM

## 2012-12-18 MED ORDER — EFAVIRENZ-EMTRICITAB-TENOFOVIR 600-200-300 MG PO TABS: ORAL_TABLET | ORAL | Status: DC

## 2012-12-27 ENCOUNTER — Ambulatory Visit (INDEPENDENT_AMBULATORY_CARE_PROVIDER_SITE_OTHER): Payer: Medicare Other | Admitting: Infectious Diseases

## 2012-12-27 ENCOUNTER — Encounter: Payer: Self-pay | Admitting: Infectious Diseases

## 2012-12-27 VITALS — BP 116/77 | HR 63 | Temp 98.0°F | Ht 64.0 in | Wt 166.0 lb

## 2012-12-27 DIAGNOSIS — Z87898 Personal history of other specified conditions: Secondary | ICD-10-CM

## 2012-12-27 DIAGNOSIS — Z79899 Other long term (current) drug therapy: Secondary | ICD-10-CM | POA: Diagnosis not present

## 2012-12-27 DIAGNOSIS — F17200 Nicotine dependence, unspecified, uncomplicated: Secondary | ICD-10-CM

## 2012-12-27 DIAGNOSIS — A630 Anogenital (venereal) warts: Secondary | ICD-10-CM

## 2012-12-27 DIAGNOSIS — F411 Generalized anxiety disorder: Secondary | ICD-10-CM | POA: Diagnosis not present

## 2012-12-27 DIAGNOSIS — B20 Human immunodeficiency virus [HIV] disease: Secondary | ICD-10-CM | POA: Diagnosis not present

## 2012-12-27 DIAGNOSIS — Z113 Encounter for screening for infections with a predominantly sexual mode of transmission: Secondary | ICD-10-CM

## 2012-12-27 MED ORDER — NICOTINE 21 MG/24HR TD PT24: 1.0000 | MEDICATED_PATCH | TRANSDERMAL | Status: DC

## 2012-12-27 MED ORDER — NICOTINE 14 MG/24HR TD PT24: 1.0000 | MEDICATED_PATCH | TRANSDERMAL | Status: DC

## 2012-12-27 MED ORDER — NICOTINE 7 MG/24HR TD PT24: 1.0000 | MEDICATED_PATCH | TRANSDERMAL | Status: DC

## 2012-12-27 NOTE — Assessment & Plan Note (Signed)
Doing much better- her older son is in prison as is her brother.

## 2012-12-27 NOTE — Assessment & Plan Note (Signed)
Improving with decreased stress.

## 2012-12-27 NOTE — Assessment & Plan Note (Signed)
Will have her eval by Dr Maisie Fus for HRA.

## 2012-12-27 NOTE — Assessment & Plan Note (Signed)
Will rx nicotine patch.

## 2012-12-27 NOTE — Assessment & Plan Note (Addendum)
She is doing very well. Will cont her current rx. She is offered/refused condoms. Will see her back in 6 months with labs prior. Will try to help her get glassses (will help her migraines)

## 2012-12-27 NOTE — Progress Notes (Signed)
  Subjective:    Patient ID: Lori [name], female    DOB: July 21, 1970, 42 y.o.   MRN: [id number]  HPI 42 yo F with HIV+, anxiety d/o, anal/vaginal warts.  Has been doing well. Wants new glasses. Continues to have daily headaches.  No problems with [place], has occas upset stomach due to eating too much.  Had PAP 09-2012 NL, HPV (-) high risk types. Not sexually active. Not sure she wants HRA.   HIV 1 RNA Quant (copies/mL)  Date Value  12/13/2012 <20   04/27/2012 <20   08/27/2011 <20      CD4 T Cell Abs (cmm)  Date Value  12/13/2012 700   04/27/2012 750   08/27/2011 600    Wants nicotine patches (1.5 ppd). Has bad tooth and has dental today. Worried about halotosis.   Review of Systems  Constitutional: Negative for appetite change and unexpected weight change.  Gastrointestinal: Negative for diarrhea and constipation.  Genitourinary: Negative for difficulty urinating.  Neurological: Positive for headaches.  Psychiatric/Behavioral: The patient is not nervous/anxious.        Objective:   Physical Exam  Constitutional: She appears well-developed and well-nourished.  HENT:  Mouth/Throat: No oropharyngeal exudate.  Eyes: EOM are normal. Pupils are equal, round, and reactive to light.  Neck: Neck supple.  Cardiovascular: Normal rate, regular rhythm and normal heart sounds.   Pulmonary/Chest: Effort normal.  Abdominal: Soft. Bowel sounds are normal. There is no tenderness.  Lymphadenopathy:    She has no cervical adenopathy.  Psychiatric: Her mood appears not anxious. She does not exhibit a depressed mood.          Assessment & Plan:  pathces Glasses HRA

## 2013-01-03 ENCOUNTER — Other Ambulatory Visit: Payer: Self-pay | Admitting: *Deleted

## 2013-01-03 DIAGNOSIS — R52 Pain, unspecified: Secondary | ICD-10-CM

## 2013-01-03 MED ORDER — IBUPROFEN 800 MG PO TABS: 800.0000 mg | ORAL_TABLET | Freq: Two times a day (BID) | ORAL | Status: DC | PRN

## 2013-01-08 ENCOUNTER — Other Ambulatory Visit: Payer: Self-pay | Admitting: Licensed Clinical Social Worker

## 2013-01-08 DIAGNOSIS — M25562 Pain in left knee: Secondary | ICD-10-CM

## 2013-01-08 MED ORDER — ACETAMINOPHEN-CODEINE #3 300-30 MG PO TABS: 2.0000 | ORAL_TABLET | ORAL | Status: DC | PRN

## 2013-01-22 ENCOUNTER — Ambulatory Visit (INDEPENDENT_AMBULATORY_CARE_PROVIDER_SITE_OTHER): Payer: Self-pay | Admitting: Surgery

## 2013-01-29 ENCOUNTER — Ambulatory Visit (INDEPENDENT_AMBULATORY_CARE_PROVIDER_SITE_OTHER): Payer: Medicare Other | Admitting: Surgery

## 2013-01-29 ENCOUNTER — Encounter (INDEPENDENT_AMBULATORY_CARE_PROVIDER_SITE_OTHER): Payer: Self-pay | Admitting: Surgery

## 2013-01-29 VITALS — BP 118/68 | HR 62 | Resp 14 | Ht 64.0 in | Wt 164.6 lb

## 2013-01-29 DIAGNOSIS — A630 Anogenital (venereal) warts: Secondary | ICD-10-CM

## 2013-01-29 NOTE — Progress Notes (Signed)
Subjective:     Patient ID: Lori [name], female   DOB: 03/11/1971, 42 y.o.   MRN: [id number]  HPI  Lori [name]  1970-10-20 [id number]  Patient Care Team: [name], MD as PCP - General (Infectious Diseases) [name], MD as PCP - Infectious Diseases (Infectious Diseases)  This patient is a 42 y.o.female who presents today for surgical evaluation at the request of Dr. [name].   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  She does smoke.  She has felt some nodules around her genitalia and perianal region.  Concern they may be small skin tags.  Her infectious disease doctor was concern about possible warts.  Therefore centimeter for evaluation.  She has been compliant with infectious disease on a regimen.  Counts good.  She has had a few episodes of bright red blood per rectum and rectal pain in the past few years.  Not this year.  Normally has one soft bowel movement a day.  Never had a colonoscopy.  She has had two episiotomies With normal vaginal deliveries but otherwise no perineal/anal rectal surgery.  No personal nor family history of GI/colon cancer, inflammatory bowel disease, irritable bowel syndrome, allergy such as Celiac Sprue, dietary/dairy problems, colitis, ulcers nor gastritis.  No recent sick contacts/gastroenteritis.  No travel outside the country.  No changes in diet.      Patient Active Problem List   Diagnosis Date Noted  . Multiple contusions 04/27/2012  . Costochondritis 04/01/2011  . 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 HIP PAIN, LEFT 06/19/2008  . KNEE PAIN, ACUTE 02/21/2008  . CELLULITIS AND ABSCESS OF OTHER SPECIFIED SITE 10/18/2007  . HAIR LOSS 10/18/2007  . SKIN RASH 10/18/2007  . ONYCHOMYCOSIS, BILATERAL 04/03/2007  . INSOMNIA 10/31/2006  . MIGRAINES, HX OF 10/31/2006  . HIV DISEASE 06/27/2006  . ANXIETY DISORDER, GENERALIZED 06/27/2006  . TOBACCO ABUSE 06/27/2006  . SINUSITIS, CHRONIC NOS 06/27/2006  .  HYSTERECTOMY, TOTAL, HX OF 12/20/2005    Past Medical History  Diagnosis Date  . HIV infection   . Allergy   . Abnormal Pap smear     Had Cryo to treat  . Anemia     Past Surgical History  Procedure Laterality Date  . Abdominal hysterectomy    . Tubal ligation    . Joint replacement      left knee  . Knee arthroscopy      History   Social History  . Marital Status: Single    Spouse Name: N/A    Number of Children: N/A  . Years of Education: N/A   Occupational History  . Not on file.   Social History Main Topics  . Smoking status: Current Every Day Smoker -- 1.00 packs/day for 27 years    Types: Cigarettes  . Smokeless tobacco: Never Used  . Alcohol Use: Yes     Comment: occasional  . Drug Use: 7.00 per week    Special: Marijuana  . Sexual Activity: Yes    Birth Control/ Protection: Surgical, Condom     Comment: pt. declined condoms   Other Topics Concern  . Not on file   Social History Narrative  . No narrative on file    Family History  Problem Relation Age of Onset  . Cancer Mother     uterine and cervical  . Cancer Maternal Grandmother   . Cancer Maternal Grandfather   . Cancer Paternal Grandfather     Current Outpatient Prescriptions  Medication Sig  Dispense Refill  . acetaminophen-codeine (TYLENOL #3) 300-30 MG per tablet Take 2 tablets by mouth every 4 (four) hours as needed for pain.  60 tablet  0  . alprazolam (XANAX) 2 MG tablet Take 1 tablet (2 mg total) by mouth 2 (two) times daily.  60 tablet  3  . cetirizine (ZYRTEC) 10 MG tablet Take 10 mg by mouth daily.      Marland Kitchen efavirenz-emtricitabine-tenofovir (ATRIPLA) 600-200-300 MG per tablet TAKE ONE TABLET BY MOUTH EVERY DAY.  30 tablet  6  . ibuprofen (ADVIL,MOTRIN) 800 MG tablet TAKE ONE TABLET BY MOUTH TWICE DAILY WITH FOOD AS NEEDED FOR PAIN  90 tablet  2  . Misc Natural Products (SINUS FORMULA PO) Take 3 tablets by mouth daily.      [name] ON 02/10/2013] nicotine (NICODERM CQ - DOSED IN  MG/24 HOURS) 14 mg/24hr patch Place 1 patch onto the skin daily.  14 patch  0  . nicotine (NICODERM CQ - DOSED IN MG/24 HOURS) 21 mg/24hr patch Place 1 patch onto the skin daily.  42 patch  0  . [START ON 02/27/2013] nicotine (NICODERM CQ - DOSED IN MG/24 HR) 7 mg/24hr patch Place 1 patch onto the skin daily.  14 patch  0   No current facility-administered medications for this visit.     Allergies  Allergen Reactions  . Sulfonamide Derivatives     BP 118/68  Pulse 62  Resp 14  Ht 5\' 4"  (1.626 m)  Wt 164 lb 9.6 oz (74.662 kg)  BMI 28.24 kg/m2  No results found.   Review of Systems  Constitutional: Negative for fever, chills, diaphoresis, appetite change and fatigue.  HENT: Negative for ear pain, sore throat, trouble swallowing, neck pain and ear discharge.   Eyes: Negative for photophobia, discharge and visual disturbance.  Respiratory: Negative for cough, choking, chest tightness and shortness of breath.   Cardiovascular: Negative for chest pain and palpitations.  Gastrointestinal: Negative for nausea, vomiting, abdominal pain, diarrhea, constipation, blood in stool, anal bleeding and rectal pain.  Endocrine: Negative for cold intolerance and heat intolerance.  Genitourinary: Negative for dysuria, frequency, decreased urine volume, vaginal bleeding, vaginal discharge and difficulty urinating.  Musculoskeletal: Negative for myalgias and gait problem.  Skin: Negative for color change, pallor and rash.  Allergic/Immunologic: Negative for environmental allergies, food allergies and immunocompromised state.  Neurological: Negative for dizziness, speech difficulty, weakness and numbness.  Hematological: Negative for adenopathy.  Psychiatric/Behavioral: Negative for confusion, sleep disturbance, self-injury, dysphoric mood and agitation.       Objective:   Physical Exam  Constitutional: She is oriented to person, place, and time. She appears well-developed and well-nourished. No  distress.  HENT:  Head: Normocephalic.  Mouth/Throat: Oropharynx is clear and moist. No oropharyngeal exudate.  Eyes: Conjunctivae and EOM are normal. Pupils are equal, round, and reactive to light. No scleral icterus.  Neck: Normal range of motion. Neck supple. No tracheal deviation present.  Cardiovascular: Normal rate, regular rhythm and intact distal pulses.   Pulmonary/Chest: Effort normal and breath sounds normal. No stridor. No respiratory distress. She exhibits no tenderness.  Abdominal: Soft. She exhibits no distension and no mass. There is no tenderness. Hernia confirmed negative in the right inguinal area and confirmed negative in the left inguinal area.  Genitourinary: No vaginal discharge found.  Exam done with assistance of female Medical Assistant in the room.  Perianal skin clean with good hygiene.  No pruritis.  No pilonidal disease.  No fissure.  No abscess/fistula.  A few tag suspicious for small warts on right mons pubis, left inner thigh, perianal region.  Not in anal canal.  No ulceration or skin discoloration in anal canal or perianal region  No external skin tags / hemorrhoids of significance.  Tolerates digital and anoscopic rectal exam.  Normal sphincter tone.  No rectal masses.  Hemorrhoidal piles WNL   Musculoskeletal: Normal range of motion. She exhibits no tenderness.       Right elbow: She exhibits normal range of motion.       Left elbow: She exhibits normal range of motion.       Right wrist: She exhibits normal range of motion.       Left wrist: She exhibits normal range of motion.       Right hand: Normal strength noted.       Left hand: Normal strength noted.  Lymphadenopathy:       Head (right side): No posterior auricular adenopathy present.       Head (left side): No posterior auricular adenopathy present.    She has no cervical adenopathy.    She has no axillary adenopathy.       Right: No inguinal adenopathy present.       Left: No inguinal  adenopathy present.  Neurological: She is alert and oriented to person, place, and time. No cranial nerve deficit. She exhibits normal muscle tone. Coordination normal.  Skin: Skin is warm and dry. No rash noted. She is not diaphoretic. No erythema.  Psychiatric: She has a normal mood and affect. Her behavior is normal. Judgment and thought content normal.       Assessment:     Small perineal and perianal skin tag suspicious for mild condylomata.     Plan:     Ablation with topical podophyllin in the office:  The pathophysiology of condyloma acuminata / anal warts was discussed.  The recognition of this is a sexually transmitted disease and can be transmitted to partners especially in an unprotected fashion was discussed as well. Options of fulguration by topical podophyllin in the office, OR examination with laser ablation versus cautery fulguration +/- excision was discussed as well. Importance for regular followup to follow the area & catch recurrence of condyloma was discussed as well.  Given the fact that there was not a large volume nor large size, I offered topical treatment in the office first. I then would recommend follow up to make sure they have resolved or require more aggressive treatment. If worse or not improved, then consideration of removal by laser ablation versus cauterization and/or excision was discussed. Technique risks benefits alternatives were discussed. The patient agreed to topical therapy.  The patient positioned in the decubitus position. I used podophyllin and painted the warts carefully, trying to avoid applying to normal perianal tissue.  The patient tolerated the procedure well.   She was very appreciative of her care  Surgical follow-up after resection of condyloma acuminatum (warts): -Rectal exam: -q 6 months until negative exam x2, then -q Year until negative exam x2, then -as needed thereafter -If they recur or worsen, consider examination under  anesthesia in the operating room.

## 2013-01-29 NOTE — Patient Instructions (Addendum)
Genital Warts Genital warts are a sexually transmitted infection. They may appear as small bumps on the tissues of the genital area. CAUSES  Genital warts are caused by a virus called human papillomavirus (HPV). HPV is the most common sexually transmitted disease (STD) and infection of the sex organs. This infection is spread by having unprotected sex with an infected person. It can be spread by vaginal, anal, and oral sex. Many people do not know they are infected. They may be infected for years without problems. However, even if they do not have problems, they can unknowingly pass the infection to their sexual partners. SYMPTOMS   Itching and irritation in the genital area.  Warts that bleed.  Painful sexual intercourse. DIAGNOSIS  Warts are usually recognized with the naked eye on the vagina, vulva, perineum, anus, and rectum. Certain tests can also diagnose genital warts, such as:  A Pap test.  A tissue sample (biopsy) exam.  Colposcopy. A magnifying tool is used to examine the vagina and cervix. The HPV cells will change color when certain solutions are used. TREATMENT  Warts can be removed by:  Applying certain chemicals, such as podophyllin.  Liquid nitrogen freezing (cryotherapy).  Immunotherapy with candida or trichophyton injections.  Laser treatment.  Burning with an electrified probe (electrocautery).  Interferon injections.  Surgery. PREVENTION  HPV vaccination can help prevent HPV infections that cause genital warts and that cause cancer of the cervix. It is recommended that the vaccination be given to people between the ages 11 to 41 years old. The vaccine might not work as well or might not work at all if you already have HPV. It should not be given to pregnant women. HOME CARE INSTRUCTIONS   It is important to follow your caregiver's instructions. The warts will not go away without treatment. Repeat treatments are often needed to get rid of warts. Even after it  appears that the warts are gone, the normal tissue underneath often remains infected.  Do not try to treat genital warts with medicine used to treat hand warts. This type of medicine is strong and can burn the skin in the genital area, causing more damage.  Tell your past and current sexual partner(s) that you have genital warts. They may be infected also and need treatment.  Avoid sexual contact while being treated.  Do not touch or scratch the warts. The infection may spread to other parts of your body.  Women with genital warts should have a cervical cancer check (Pap test) at least once a year. This type of cancer is slow-growing and can be cured if found early. Chances of developing cervical cancer are increased with HPV.  Inform your obstetrician about your warts in the event of pregnancy. This virus can be passed to the baby's respiratory tract. Discuss this with your caregiver.  Use a condom during sexual intercourse. Following treatment, the use of condoms will help prevent reinfection.  Ask your caregiver about using over-the-counter anti-itch creams. SEEK MEDICAL CARE IF:   Your treated skin becomes red, swollen, or painful.  You have a fever.  You feel generally ill.  You feel little lumps in and around your genital area.  You are bleeding or have painful sexual intercourse. MAKE SURE YOU:   Understand these instructions.  Will watch your condition.  Will get help right away if you are not doing well or get worse. Document Released: 05/28/2000 Document Revised: 08/23/2011 Document Reviewed: 12/07/2010 [facility] Patient Information 2014 [place], [place].  Podofilox topical  gel What is this medicine? PODOFILOX (po do FIL ox) is used to remove genital or perianal warts. This medicine may be used for other purposes; ask your health care provider or pharmacist if you have questions. What should I tell my health care provider before I take this medicine? They need to  know if you have any of these conditions: -an unusual or allergic reaction to podofilox, podophyllum resin, other medicines, foods, dyes or preservatives -pregnant or trying to get pregnant -breast-feeding How should I use this medicine? This medicine is for external use only. Do not take by mouth. This medicine may be used to remove warts on areas of the skin around the vagina or penis and between the rectum and the genitals. It should not be used to treat warts that are inside the rectum, vagina, or penis. Follow the directions on the prescription label. Wash hands before and after use. Apply the gel to the specific wart as instructed by your doctor or health care professional. Use the applicator provided or a cotton-tipped applicator. Applicators should not be re-used. Make sure that the gel is dry before normal, untreated skin comes into contact with the treated skin. This medicine can cause severe irritation of normal skin. If contact with normal skin occurs, immediately flush the area thoroughly with water. Avoid contact with the eyes. If eye contact occurs, immediately flush the eye with large quantities of water for 15 minutes and seek medical attention. Do not use this medicine more often or for longer than directed. Talk to your pediatrician regarding the use of this medicine in children. Special care may be needed. Overdosage: If you think you have taken too much of this medicine contact a poison control center or emergency room at once. NOTE: This medicine is only for you. Do not share this medicine with others. What if I miss a dose? If you miss a dose, use it as soon as you can. If it is almost time for your next dose, use only that dose. Do not use double or extra doses. What may interact with this medicine? Interactions are not expected. Do not use any other skin products on the same area of skin without asking your doctor or health care professional. This list may not describe all  possible interactions. Give your health care provider a list of all the medicines, herbs, non-prescription drugs, or dietary supplements you use. Also tell them if you smoke, drink alcohol, or use illegal drugs. Some items may interact with your medicine. What should I watch for while using this medicine? Visit your doctor or health care professional for regular checks on your progress. This medicine is not a cure. New warts may develop during or after treatment. Tell your doctor or health care professional if your symptoms do not start to get better within one week. The weekly treatment course can be repeated up to 4 times. If the wart does not go away in 4 weeks, a different treatment should be considered. If you are pregnant or think you might be pregnant, contact your doctor or health care professional. Sexual (genital, oral, anal) contact should be avoided while the medication is on the skin. The only way to prevent infecting others with the HPV virus (the virus that causes genital warts) is to avoid direct skin-to-skin contact. If warts are visible in the genital area, sexual contact should be avoided until the warts are treated. Experts advise that using latex condoms during sexual contact may reduce, but not entirely  prevent, infecting others. What side effects may I notice from receiving this medicine? Side effects that you should report to your doctor or health care professional as soon as possible: -allergic reactions like skin rash, itching or hives, swelling of the face, lips, or tongue -bleeding, blistering, burning, crusting, or scabbing of treated skin -blood in the urine -dizziness -vomiting (may indicate excessive dosage) Side effects that usually do not require medical attention (report to your doctor or health care professional if they continue or are bothersome): -dryness, flaking or peeling of the skin -headache -mild redness, itching or stinging of the skin This list may not  describe all possible side effects. Call your doctor for medical advice about side effects. You may report side effects to FDA at 1-800-FDA-1088. Where should I keep my medicine? Keep out of the reach of children. Store at room temperature between 15 and 30 degrees C (59 and 86 degrees F). Do not freeze. Keep container tightly closed. This medicine contains alcohol and is flammable. Do not store near heat or open flame. Throw away any unused medicine after the expiration date. NOTE: This sheet is a summary. It may not cover all possible information. If you have questions about this medicine, talk to your doctor, pharmacist, or health care provider.  2012, Elsevier/Gold Standard. (01/01/2008 4:12:59 PM)

## 2013-02-16 ENCOUNTER — Other Ambulatory Visit: Payer: Self-pay | Admitting: *Deleted

## 2013-02-16 DIAGNOSIS — M25562 Pain in left knee: Secondary | ICD-10-CM

## 2013-02-16 MED ORDER — ACETAMINOPHEN-CODEINE #3 300-30 MG PO TABS: 2.0000 | ORAL_TABLET | ORAL | Status: DC | PRN

## 2013-02-22 ENCOUNTER — Telehealth: Payer: Self-pay | Admitting: *Deleted

## 2013-02-22 NOTE — Telephone Encounter (Signed)
Patient called requesting an Rx for Augmentin for sinus "infection". Offered an appointment but patient prefers to just have an antibiotic called in. She is not having fever or cough. Lori [name]

## 2013-03-12 ENCOUNTER — Telehealth: Payer: Self-pay | Admitting: *Deleted

## 2013-03-12 NOTE — Telephone Encounter (Signed)
Pharmacy forwarded request for xanax and tylenol #3. These are not due until 10/7 and 10/5 respectively.  Denied.  Pt continuously requests these prescriptions early. Andree Coss, RN

## 2013-03-13 ENCOUNTER — Telehealth: Payer: Self-pay | Admitting: *Deleted

## 2013-03-13 DIAGNOSIS — J322 Chronic ethmoidal sinusitis: Secondary | ICD-10-CM

## 2013-03-13 DIAGNOSIS — F419 Anxiety disorder, unspecified: Secondary | ICD-10-CM

## 2013-03-13 NOTE — Telephone Encounter (Signed)
Too early to refill rx.  Last refill 02/16/13.  Requested pt call on 03/19/13 for refill. Pt states she is having sinus drainage and headache x 2 weeks.  Requesting Augmentin refill. Requesting refill for Xanax. MD please advise about Augmentin and Xanax refills.

## 2013-03-14 MED ORDER — ALPRAZOLAM 2 MG PO TABS: 2.0000 mg | ORAL_TABLET | Freq: Two times a day (BID) | ORAL | Status: DC

## 2013-03-14 MED ORDER — AMOXICILLIN-POT CLAVULANATE 875-125 MG PO TABS: 1.0000 | ORAL_TABLET | Freq: Two times a day (BID) | ORAL | Status: DC

## 2013-03-14 NOTE — Addendum Note (Signed)
Addended by: Jennet Maduro D on: 03/14/2013 04:54 PM   Modules accepted: Orders

## 2013-03-14 NOTE — Telephone Encounter (Signed)
Ok to refill 

## 2013-03-26 ENCOUNTER — Other Ambulatory Visit: Payer: Self-pay | Admitting: Infectious Diseases

## 2013-03-26 DIAGNOSIS — M25562 Pain in left knee: Secondary | ICD-10-CM

## 2013-03-26 MED ORDER — ACETAMINOPHEN-CODEINE #3 300-30 MG PO TABS: 2.0000 | ORAL_TABLET | ORAL | Status: DC | PRN

## 2013-03-27 ENCOUNTER — Telehealth: Payer: Self-pay

## 2013-03-27 DIAGNOSIS — M25562 Pain in left knee: Secondary | ICD-10-CM

## 2013-03-27 MED ORDER — ACETAMINOPHEN-CODEINE #3 300-30 MG PO TABS: 2.0000 | ORAL_TABLET | ORAL | Status: DC | PRN

## 2013-03-27 NOTE — Telephone Encounter (Signed)
Message left on machine: Pt is calling. She is very upset that her Tylenol #3 was not called to the pharmacy.  She called on Thursday for this request.  She states she was never informed it was too early by our office.  She want to know what happened?  She has to drive one hour to the pharmacy.   I will speak with Dr [name].     Laurell Josephs, RN

## 2013-03-27 NOTE — Telephone Encounter (Signed)
Per Dr [name] okay for refill of Tylenol # 3 .   I will call this to the pharmacy.    Laurell Josephs, RN

## 2013-04-19 ENCOUNTER — Ambulatory Visit: Payer: Medicare Other | Admitting: Infectious Diseases

## 2013-04-23 ENCOUNTER — Other Ambulatory Visit: Payer: Self-pay | Admitting: Infectious Diseases

## 2013-04-25 ENCOUNTER — Other Ambulatory Visit: Payer: Self-pay | Admitting: *Deleted

## 2013-04-30 ENCOUNTER — Ambulatory Visit (INDEPENDENT_AMBULATORY_CARE_PROVIDER_SITE_OTHER): Payer: Medicare Other | Admitting: Surgery

## 2013-05-23 ENCOUNTER — Other Ambulatory Visit: Payer: Self-pay | Admitting: Infectious Diseases

## 2013-05-25 ENCOUNTER — Telehealth: Payer: Self-pay | Admitting: *Deleted

## 2013-05-25 NOTE — Telephone Encounter (Signed)
Refill request from pharmacy for Tylenol # 3, last filled 04/23/13. Please advise if ok to refill. Wendall Mola

## 2013-05-30 ENCOUNTER — Other Ambulatory Visit: Payer: Self-pay | Admitting: *Deleted

## 2013-05-30 DIAGNOSIS — R51 Headache: Secondary | ICD-10-CM

## 2013-05-30 MED ORDER — ACETAMINOPHEN-CODEINE #3 300-30 MG PO TABS: 2.0000 | ORAL_TABLET | ORAL | Status: DC | PRN

## 2013-05-30 NOTE — Telephone Encounter (Signed)
Ok to refill 

## 2013-06-18 ENCOUNTER — Other Ambulatory Visit: Payer: Self-pay | Admitting: Infectious Diseases

## 2013-06-22 ENCOUNTER — Other Ambulatory Visit: Payer: Self-pay

## 2013-06-22 DIAGNOSIS — Z8669 Personal history of other diseases of the nervous system and sense organs: Secondary | ICD-10-CM

## 2013-06-22 MED ORDER — IBUPROFEN 800 MG PO TABS: ORAL_TABLET | ORAL | Status: DC

## 2013-06-22 NOTE — Telephone Encounter (Signed)
Ibuprofen 800 milligrams quantity changed to # 60 . Patient is to use as needed for pain. Not daily.  If patient has complaint she should schedule appointment with provider to discuss.  Medication sent to pharmacy.   Laurell Josephsammy K King, RN

## 2013-06-25 ENCOUNTER — Other Ambulatory Visit: Payer: Medicare Other

## 2013-06-25 DIAGNOSIS — B20 Human immunodeficiency virus [HIV] disease: Secondary | ICD-10-CM | POA: Diagnosis not present

## 2013-06-25 DIAGNOSIS — Z79899 Other long term (current) drug therapy: Secondary | ICD-10-CM

## 2013-06-25 DIAGNOSIS — Z113 Encounter for screening for infections with a predominantly sexual mode of transmission: Secondary | ICD-10-CM

## 2013-06-25 LAB — COMPLETE METABOLIC PANEL WITH GFR
ALT: 12 U/L (ref 0–35)
AST: 17 U/L (ref 0–37)
Albumin: 4.2 g/dL (ref 3.5–5.2)
Alkaline Phosphatase: 64 U/L (ref 39–117)
BUN: 12 mg/dL (ref 6–23)
CALCIUM: 8.8 mg/dL (ref 8.4–10.5)
CO2: 28 mEq/L (ref 19–32)
CREATININE: 0.73 mg/dL (ref 0.50–1.10)
Chloride: 103 mEq/L (ref 96–112)
GFR, Est African American: >89 mL/min
GLUCOSE: 87 mg/dL (ref 70–99)
Potassium: 4 mEq/L (ref 3.5–5.3)
Sodium: 140 mEq/L (ref 135–145)
Total Bilirubin: 0.3 mg/dL (ref 0.3–1.2)
Total Protein: 6.1 g/dL (ref 6.0–8.3)

## 2013-06-25 LAB — CBC WITH DIFFERENTIAL/PLATELET
BASOS ABS: 0 10*3/uL (ref 0.0–0.1)
BASOS PCT: 1 % (ref 0–1)
Eosinophils Absolute: 0.1 10*3/uL (ref 0.0–0.7)
Eosinophils Relative: 1 % (ref 0–5)
HEMATOCRIT: 38.1 % (ref 36.0–46.0)
Hemoglobin: 13.2 g/dL (ref 12.0–15.0)
LYMPHS PCT: 26 % (ref 12–46)
Lymphs Abs: 1.3 10*3/uL (ref 0.7–4.0)
MCH: 33.5 pg (ref 26.0–34.0)
MCHC: 34.6 g/dL (ref 30.0–36.0)
MCV: 96.7 fL (ref 78.0–100.0)
MONO ABS: 0.2 10*3/uL (ref 0.1–1.0)
Monocytes Relative: 4 % (ref 3–12)
Neutro Abs: 3.4 10*3/uL (ref 1.7–7.7)
Neutrophils Relative %: 68 % (ref 43–77)
Platelets: 174 10*3/uL (ref 150–400)
RBC: 3.94 MIL/uL (ref 3.87–5.11)
RDW: 13.6 % (ref 11.5–15.5)
WBC: 5 10*3/uL (ref 4.0–10.5)

## 2013-06-25 LAB — LIPID PANEL
Cholesterol: 184 mg/dL (ref 0–200)
HDL: 40 mg/dL (ref >39)
LDL Cholesterol: 129 mg/dL — ABNORMAL HIGH (ref 0–99)
Total CHOL/HDL Ratio: 4.6 Ratio
Triglycerides: 77 mg/dL (ref <150)
VLDL: 15 mg/dL (ref 0–40)

## 2013-06-26 ENCOUNTER — Telehealth: Payer: Self-pay | Admitting: *Deleted

## 2013-06-26 LAB — HIV-1 RNA QUANT-NO REFLEX-BLD

## 2013-06-26 LAB — T-HELPER CELL (CD4) - (RCID CLINIC ONLY)
CD4 T CELL ABS: 380 /uL — AB (ref 400–2700)
CD4 T CELL HELPER: 31 % — AB (ref 33–55)

## 2013-06-26 LAB — RPR

## 2013-06-26 NOTE — Telephone Encounter (Signed)
Provided appt for pt to come 06/27/13 w/ Dr. Ninetta LightsHatcher.

## 2013-06-27 ENCOUNTER — Encounter: Payer: Self-pay | Admitting: Infectious Diseases

## 2013-06-27 ENCOUNTER — Ambulatory Visit (INDEPENDENT_AMBULATORY_CARE_PROVIDER_SITE_OTHER): Payer: Medicare Other | Admitting: Infectious Diseases

## 2013-06-27 VITALS — BP 136/87 | HR 69 | Temp 98.7°F | Wt 164.0 lb

## 2013-06-27 DIAGNOSIS — Z01419 Encounter for gynecological examination (general) (routine) without abnormal findings: Secondary | ICD-10-CM | POA: Diagnosis not present

## 2013-06-27 DIAGNOSIS — Z79899 Other long term (current) drug therapy: Secondary | ICD-10-CM

## 2013-06-27 DIAGNOSIS — B029 Zoster without complications: Secondary | ICD-10-CM | POA: Insufficient documentation

## 2013-06-27 DIAGNOSIS — F17200 Nicotine dependence, unspecified, uncomplicated: Secondary | ICD-10-CM

## 2013-06-27 DIAGNOSIS — B20 Human immunodeficiency virus [HIV] disease: Secondary | ICD-10-CM

## 2013-06-27 DIAGNOSIS — Z113 Encounter for screening for infections with a predominantly sexual mode of transmission: Secondary | ICD-10-CM | POA: Diagnosis not present

## 2013-06-27 DIAGNOSIS — T07XXXA Unspecified multiple injuries, initial encounter: Secondary | ICD-10-CM

## 2013-06-27 MED ORDER — OXYCODONE-ACETAMINOPHEN 10-325 MG PO TABS: 1.0000 | ORAL_TABLET | ORAL | Status: DC | PRN

## 2013-06-27 MED ORDER — VALACYCLOVIR HCL 1 G PO TABS: 1000.0000 mg | ORAL_TABLET | Freq: Three times a day (TID) | ORAL | Status: DC

## 2013-06-27 NOTE — Assessment & Plan Note (Signed)
encouraged to quit smoking.

## 2013-06-27 NOTE — Assessment & Plan Note (Signed)
She is doing well. Had drop in CD4 probably related to her current illness. Offered/refuses condoms. F/u PAP. Declines flu shot. PNVX later this year.

## 2013-06-27 NOTE — Progress Notes (Signed)
   Subjective:    Patient ID: Lori BoozeCristina J Messineo, female    DOB: 05/11/1971, 43 y.o.   MRN: [id number]  HPI 43 yo F with HIV+, anxiety d/o, anal/vaginal warts.  No problems with atripla.  Had PAP 09-2012 NL, HPV (-) high risk types. Had HRA 01-2013- no internal lesions, given podophylin for small external warts.  Today here with concerns that she has shingles.   HIV 1 RNA Quant (copies/mL)  Date Value  06/25/2013 <20   12/13/2012 <20   04/27/2012 <20      CD4 T Cell Abs (/uL)  Date Value  06/25/2013 380*  12/13/2012 700   04/27/2012 750      Review of Systems  Constitutional: Positive for fever and chills. Negative for appetite change and unexpected weight change.  Gastrointestinal: Negative for diarrhea and constipation.  Genitourinary: Negative for dysuria, frequency, vaginal discharge and difficulty urinating.  Skin: Positive for rash.  Neurological: Negative for headaches.  Hematological: Positive for adenopathy.       Objective:   Physical Exam  Constitutional: She appears well-developed and well-nourished.  HENT:  Mouth/Throat: No oropharyngeal exudate.  Eyes: EOM are normal. Pupils are equal, round, and reactive to light.  Neck: Neck supple.  Cardiovascular: Normal rate, regular rhythm and normal heart sounds.   Pulmonary/Chest: Effort normal and breath sounds normal.  Abdominal: Soft. Bowel sounds are normal. There is no tenderness. There is no rebound.  Lymphadenopathy:    She has no cervical adenopathy.       Left: Inguinal adenopathy present.  Skin:             Assessment & Plan:

## 2013-06-27 NOTE — Assessment & Plan Note (Signed)
Will give her trial of valtrex. i cautioned her that it may help with pain but her stage it may not been useful.

## 2013-07-02 ENCOUNTER — Other Ambulatory Visit: Payer: Self-pay | Admitting: Infectious Diseases

## 2013-07-02 DIAGNOSIS — B20 Human immunodeficiency virus [HIV] disease: Secondary | ICD-10-CM

## 2013-07-02 DIAGNOSIS — F411 Generalized anxiety disorder: Secondary | ICD-10-CM

## 2013-07-02 DIAGNOSIS — M549 Dorsalgia, unspecified: Secondary | ICD-10-CM

## 2013-07-09 ENCOUNTER — Ambulatory Visit: Payer: Medicare Other | Admitting: Infectious Diseases

## 2013-07-16 ENCOUNTER — Ambulatory Visit: Payer: Medicare Other | Admitting: Infectious Diseases

## 2013-07-17 ENCOUNTER — Telehealth: Payer: Self-pay | Admitting: Licensed Clinical Social Worker
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
## 2013-07-30 ENCOUNTER — Other Ambulatory Visit: Payer: Self-pay | Admitting: Infectious Diseases

## 2013-08-01 ENCOUNTER — Other Ambulatory Visit: Payer: Self-pay | Admitting: *Deleted

## 2013-08-01 DIAGNOSIS — F411 Generalized anxiety disorder: Secondary | ICD-10-CM

## 2013-08-01 MED ORDER — ALPRAZOLAM 2 MG PO TABS: 2.0000 mg | ORAL_TABLET | Freq: Two times a day (BID) | ORAL | Status: DC

## 2013-08-01 MED ORDER — ACETAMINOPHEN-CODEINE #3 300-30 MG PO TABS: 2.0000 | ORAL_TABLET | ORAL | Status: DC | PRN

## 2013-08-01 NOTE — Telephone Encounter (Signed)
Ok to refill, thanks

## 2013-08-01 NOTE — Telephone Encounter (Signed)
Is patient still receiving these refills? Wendall MolaJacqueline Cockerham

## 2013-08-27 ENCOUNTER — Other Ambulatory Visit: Payer: Self-pay | Admitting: Infectious Diseases

## 2013-08-28 ENCOUNTER — Other Ambulatory Visit: Payer: Self-pay | Admitting: Licensed Clinical Social Worker

## 2013-08-28 DIAGNOSIS — F411 Generalized anxiety disorder: Secondary | ICD-10-CM

## 2013-08-28 MED ORDER — ALPRAZOLAM 2 MG PO TABS: 2.0000 mg | ORAL_TABLET | Freq: Two times a day (BID) | ORAL | Status: DC

## 2013-08-28 MED ORDER — ACETAMINOPHEN-CODEINE #3 300-30 MG PO TABS: 2.0000 | ORAL_TABLET | ORAL | Status: DC | PRN

## 2013-09-05 ENCOUNTER — Other Ambulatory Visit: Payer: Self-pay | Admitting: Infectious Diseases

## 2013-09-13 ENCOUNTER — Telehealth: Payer: Self-pay | Admitting: *Deleted

## 2013-09-13 NOTE — Telephone Encounter (Signed)
Patient called regarding her ibuprofen prescription.  Per patient, she takes the ibuprofen for body aches and takes the Tylenol #3 for migraines. Pt is interested in neurology for migraine therapy.    Please advise if ongoing refills of Ibuprofen 800 mg #60/month are appropriate.

## 2013-09-13 NOTE — Telephone Encounter (Signed)
Ok to refill 

## 2013-09-18 ENCOUNTER — Telehealth: Payer: Self-pay | Admitting: *Deleted

## 2013-09-18 DIAGNOSIS — Z8669 Personal history of other diseases of the nervous system and sense organs: Secondary | ICD-10-CM

## 2013-09-18 MED ORDER — IBUPROFEN 800 MG PO TABS: ORAL_TABLET | ORAL | Status: DC

## 2013-09-18 NOTE — Telephone Encounter (Signed)
Left message notifying patient that Dr. Ninetta LightsHatcher approved her ibuprofen refills. Andree CossHowell, Sachit Gilman M, RN

## 2013-09-20 ENCOUNTER — Telehealth: Payer: Self-pay | Admitting: Internal Medicine

## 2013-09-28 ENCOUNTER — Other Ambulatory Visit: Payer: Self-pay | Admitting: Infectious Diseases

## 2013-09-28 ENCOUNTER — Other Ambulatory Visit: Payer: Self-pay | Admitting: *Deleted

## 2013-09-28 DIAGNOSIS — F411 Generalized anxiety disorder: Secondary | ICD-10-CM

## 2013-09-28 DIAGNOSIS — T07XXXA Unspecified multiple injuries, initial encounter: Secondary | ICD-10-CM

## 2013-09-28 MED ORDER — ACETAMINOPHEN-CODEINE #3 300-30 MG PO TABS: 2.0000 | ORAL_TABLET | ORAL | Status: DC | PRN

## 2013-09-28 MED ORDER — ALPRAZOLAM 2 MG PO TABS: 2.0000 mg | ORAL_TABLET | Freq: Two times a day (BID) | ORAL | Status: DC

## 2013-09-28 MED ORDER — OXYCODONE-ACETAMINOPHEN 10-325 MG PO TABS: 1.0000 | ORAL_TABLET | ORAL | Status: DC | PRN

## 2013-10-30 ENCOUNTER — Telehealth: Payer: Self-pay | Admitting: *Deleted

## 2013-10-30 ENCOUNTER — Other Ambulatory Visit: Payer: Self-pay | Admitting: *Deleted

## 2013-10-30 DIAGNOSIS — F411 Generalized anxiety disorder: Secondary | ICD-10-CM

## 2013-10-30 MED ORDER — ALPRAZOLAM 2 MG PO TABS: 2.0000 mg | ORAL_TABLET | Freq: Two times a day (BID) | ORAL | Status: DC

## 2013-10-30 MED ORDER — ACETAMINOPHEN-CODEINE #3 300-30 MG PO TABS: 2.0000 | ORAL_TABLET | ORAL | Status: DC | PRN

## 2013-10-30 NOTE — Telephone Encounter (Signed)
Rx called to patient's pharmacy. Lori [name]

## 2013-10-30 NOTE — Telephone Encounter (Signed)
Dr. Ninetta LightsHatcher, OK to phone in refills for Tylenol #3 and alprazolam?

## 2013-10-30 NOTE — Telephone Encounter (Signed)
Yes, thanks

## 2013-11-27 ENCOUNTER — Other Ambulatory Visit: Payer: Self-pay | Admitting: Infectious Diseases

## 2013-11-27 ENCOUNTER — Telehealth: Payer: Self-pay | Admitting: Licensed Clinical Social Worker

## 2013-11-27 NOTE — Telephone Encounter (Signed)
Ok thanks 

## 2013-11-27 NOTE — Telephone Encounter (Signed)
Patient requesting tylenol 3, xanax, and Ibuprofen 800 mg

## 2013-11-28 ENCOUNTER — Other Ambulatory Visit: Payer: Self-pay | Admitting: Licensed Clinical Social Worker

## 2013-11-28 ENCOUNTER — Other Ambulatory Visit: Payer: Self-pay | Admitting: Infectious Diseases

## 2013-12-10 ENCOUNTER — Other Ambulatory Visit: Payer: Medicare Other

## 2013-12-10 ENCOUNTER — Other Ambulatory Visit (HOSPITAL_COMMUNITY)
Admission: RE | Admit: 2013-12-10 | Discharge: 2013-12-10 | Disposition: A | Discharge status: Home / Self Care | Payer: Medicare Other | Source: Ambulatory Visit | Attending: Infectious Disease | Admitting: Infectious Disease

## 2013-12-10 DIAGNOSIS — Z79899 Other long term (current) drug therapy: Secondary | ICD-10-CM | POA: Diagnosis not present

## 2013-12-10 DIAGNOSIS — B20 Human immunodeficiency virus [HIV] disease: Secondary | ICD-10-CM | POA: Diagnosis not present

## 2013-12-10 DIAGNOSIS — Z113 Encounter for screening for infections with a predominantly sexual mode of transmission: Secondary | ICD-10-CM | POA: Diagnosis not present

## 2013-12-10 LAB — COMPLETE METABOLIC PANEL WITH GFR
ALT: 19 U/L (ref 0–35)
AST: 18 U/L (ref 0–37)
Albumin: 4.5 g/dL (ref 3.5–5.2)
Alkaline Phosphatase: 60 U/L (ref 39–117)
BUN: 15 mg/dL (ref 6–23)
CALCIUM: 9.1 mg/dL (ref 8.4–10.5)
CO2: 30 meq/L (ref 19–32)
CREATININE: 0.81 mg/dL (ref 0.50–1.10)
Chloride: 107 mEq/L (ref 96–112)
GFR, Est Non African American: >89 mL/min
Glucose, Bld: 71 mg/dL (ref 70–99)
Potassium: 4.6 mEq/L (ref 3.5–5.3)
Sodium: 143 mEq/L (ref 135–145)
Total Bilirubin: 0.4 mg/dL (ref 0.2–1.2)
Total Protein: 6.5 g/dL (ref 6.0–8.3)

## 2013-12-10 LAB — LIPID PANEL
Cholesterol: 233 mg/dL — ABNORMAL HIGH (ref 0–200)
HDL: 47 mg/dL (ref >39)
LDL Cholesterol: 158 mg/dL — ABNORMAL HIGH (ref 0–99)
TRIGLYCERIDES: 138 mg/dL (ref <150)
Total CHOL/HDL Ratio: 5 Ratio
VLDL: 28 mg/dL (ref 0–40)

## 2013-12-10 LAB — CBC WITH DIFFERENTIAL/PLATELET
Basophils Absolute: 0 10*3/uL (ref 0.0–0.1)
Basophils Relative: 0 % (ref 0–1)
EOS ABS: 0.1 10*3/uL (ref 0.0–0.7)
EOS PCT: 2 % (ref 0–5)
HCT: 37.7 % (ref 36.0–46.0)
Hemoglobin: 13 g/dL (ref 12.0–15.0)
LYMPHS ABS: 1.9 10*3/uL (ref 0.7–4.0)
Lymphocytes Relative: 37 % (ref 12–46)
MCH: 32.8 pg (ref 26.0–34.0)
MCHC: 34.5 g/dL (ref 30.0–36.0)
MCV: 95.2 fL (ref 78.0–100.0)
Monocytes Absolute: 0.3 10*3/uL (ref 0.1–1.0)
Monocytes Relative: 5 % (ref 3–12)
Neutro Abs: 2.9 10*3/uL (ref 1.7–7.7)
Neutrophils Relative %: 56 % (ref 43–77)
Platelets: 194 10*3/uL (ref 150–400)
RBC: 3.96 MIL/uL (ref 3.87–5.11)
RDW: 13.9 % (ref 11.5–15.5)
WBC: 5.1 10*3/uL (ref 4.0–10.5)

## 2013-12-10 LAB — RPR

## 2013-12-11 LAB — T-HELPER CELL (CD4) - (RCID CLINIC ONLY)
CD4 % Helper T Cell: 34 % (ref 33–55)
CD4 T Cell Abs: 640 /uL (ref 400–2700)

## 2013-12-12 LAB — HIV-1 RNA QUANT-NO REFLEX-BLD

## 2013-12-24 ENCOUNTER — Encounter: Payer: Self-pay | Admitting: Infectious Diseases

## 2013-12-24 ENCOUNTER — Ambulatory Visit (INDEPENDENT_AMBULATORY_CARE_PROVIDER_SITE_OTHER): Payer: Medicare Other | Admitting: Infectious Diseases

## 2013-12-24 VITALS — BP 111/76 | HR 61 | Temp 98.2°F | Ht 64.0 in | Wt 166.0 lb

## 2013-12-24 DIAGNOSIS — F17200 Nicotine dependence, unspecified, uncomplicated: Secondary | ICD-10-CM

## 2013-12-24 DIAGNOSIS — A630 Anogenital (venereal) warts: Secondary | ICD-10-CM

## 2013-12-24 DIAGNOSIS — M549 Dorsalgia, unspecified: Secondary | ICD-10-CM | POA: Insufficient documentation

## 2013-12-24 DIAGNOSIS — F411 Generalized anxiety disorder: Secondary | ICD-10-CM | POA: Diagnosis not present

## 2013-12-24 DIAGNOSIS — B20 Human immunodeficiency virus [HIV] disease: Secondary | ICD-10-CM | POA: Diagnosis not present

## 2013-12-24 DIAGNOSIS — M546 Pain in thoracic spine: Secondary | ICD-10-CM

## 2013-12-24 MED ORDER — CYCLOBENZAPRINE HCL 10 MG PO TABS: 10.0000 mg | ORAL_TABLET | Freq: Three times a day (TID) | ORAL | Status: DC | PRN

## 2013-12-24 NOTE — Assessment & Plan Note (Signed)
Will get pap, mammo September.

## 2013-12-24 NOTE — Assessment & Plan Note (Signed)
Encouraged to quit. 

## 2013-12-24 NOTE — Assessment & Plan Note (Signed)
Appears to be doing well. Has been careful with her art and her etoh use.has condoms. Will see her back in 6 months.
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
## 2013-12-24 NOTE — Assessment & Plan Note (Signed)
Is going to counseling.

## 2013-12-24 NOTE — Assessment & Plan Note (Signed)
Encouraged her to try heating pads. Will give her rx for flexeril.

## 2013-12-26 ENCOUNTER — Other Ambulatory Visit: Payer: Self-pay | Admitting: Infectious Diseases

## 2013-12-26 DIAGNOSIS — F411 Generalized anxiety disorder: Secondary | ICD-10-CM

## 2013-12-26 DIAGNOSIS — M546 Pain in thoracic spine: Secondary | ICD-10-CM

## 2013-12-26 NOTE — Addendum Note (Signed)
Addended by: Andree CossHOWELL, Lynnley Doddridge M on: 12/26/2013 03:55 PM   Modules accepted: Orders

## 2013-12-27 ENCOUNTER — Other Ambulatory Visit: Payer: Self-pay | Admitting: *Deleted

## 2014-01-07 ENCOUNTER — Other Ambulatory Visit: Payer: Self-pay | Admitting: Infectious Diseases

## 2014-01-07 DIAGNOSIS — Z1231 Encounter for screening mammogram for malignant neoplasm of breast: Secondary | ICD-10-CM

## 2014-01-19 ENCOUNTER — Emergency Department (HOSPITAL_COMMUNITY): Payer: Medicare Other

## 2014-01-19 ENCOUNTER — Emergency Department (HOSPITAL_COMMUNITY)
Admission: EM | Admit: 2014-01-19 | Discharge: 2014-01-19 | Disposition: A | Discharge status: Home / Self Care | Payer: Medicare Other | Attending: Emergency Medicine | Admitting: Emergency Medicine

## 2014-01-19 ENCOUNTER — Encounter (HOSPITAL_COMMUNITY): Payer: Self-pay | Admitting: Emergency Medicine

## 2014-01-19 DIAGNOSIS — S8990XA Unspecified injury of unspecified lower leg, initial encounter: Secondary | ICD-10-CM | POA: Diagnosis not present

## 2014-01-19 DIAGNOSIS — Z79899 Other long term (current) drug therapy: Secondary | ICD-10-CM | POA: Insufficient documentation

## 2014-01-19 DIAGNOSIS — F17200 Nicotine dependence, unspecified, uncomplicated: Secondary | ICD-10-CM | POA: Insufficient documentation

## 2014-01-19 DIAGNOSIS — Z21 Asymptomatic human immunodeficiency virus [HIV] infection status: Secondary | ICD-10-CM | POA: Insufficient documentation

## 2014-01-19 DIAGNOSIS — S99929A Unspecified injury of unspecified foot, initial encounter: Secondary | ICD-10-CM

## 2014-01-19 DIAGNOSIS — Y929 Unspecified place or not applicable: Secondary | ICD-10-CM | POA: Diagnosis not present

## 2014-01-19 DIAGNOSIS — S93409A Sprain of unspecified ligament of unspecified ankle, initial encounter: Secondary | ICD-10-CM | POA: Insufficient documentation

## 2014-01-19 DIAGNOSIS — Y9389 Activity, other specified: Secondary | ICD-10-CM | POA: Diagnosis not present

## 2014-01-19 DIAGNOSIS — S92309A Fracture of unspecified metatarsal bone(s), unspecified foot, initial encounter for closed fracture: Secondary | ICD-10-CM | POA: Insufficient documentation

## 2014-01-19 DIAGNOSIS — Z96659 Presence of unspecified artificial knee joint: Secondary | ICD-10-CM | POA: Diagnosis not present

## 2014-01-19 DIAGNOSIS — S99919A Unspecified injury of unspecified ankle, initial encounter: Secondary | ICD-10-CM

## 2014-01-19 DIAGNOSIS — W010XXA Fall on same level from slipping, tripping and stumbling without subsequent striking against object, initial encounter: Secondary | ICD-10-CM | POA: Insufficient documentation

## 2014-01-19 DIAGNOSIS — Z862 Personal history of diseases of the blood and blood-forming organs and certain disorders involving the immune mechanism: Secondary | ICD-10-CM | POA: Diagnosis not present

## 2014-01-19 DIAGNOSIS — S93401A Sprain of unspecified ligament of right ankle, initial encounter: Secondary | ICD-10-CM

## 2014-01-19 DIAGNOSIS — S92301A Fracture of unspecified metatarsal bone(s), right foot, initial encounter for closed fracture: Secondary | ICD-10-CM

## 2014-01-19 MED ORDER — OXYCODONE-ACETAMINOPHEN 5-325 MG PO TABS: 1.0000 | ORAL_TABLET | ORAL | Status: DC | PRN

## 2014-01-19 NOTE — ED Provider Notes (Signed)
CSN: [id number]     Arrival date & time 01/19/14  1123 History   First MD Initiated Contact with Patient 01/19/14 1138     Chief Complaint  Patient presents with  . Foot Pain     (Consider location/radiation/quality/duration/timing/severity/associated sxs/prior Treatment) Patient is a 43 y.o. female presenting with foot injury. The history is provided by the patient.  Foot Injury Location:  Foot Time since incident:  1 day Injury: yes   Mechanism of injury: fall   Fall:    Fall occurred:  Tripped   Impact surface:  Hard floor Foot location:  R foot Pain details:    Quality:  Aching   Radiates to:  Does not radiate   Severity:  Severe   Onset quality:  Sudden   Timing:  Constant   Progression:  Unchanged Chronicity:  New Foreign body present:  No foreign bodies Prior injury to area:  No Relieved by:  Nothing Worsened by:  Bearing weight Associated symptoms: swelling    Lori [name] is a 43 y.o. female who presents to the ED with right foot pain. States that last night she tripped and rolled her foot. Complains of pain and swelling to the right foot. She denies other injuries or problems today.   Past Medical History  Diagnosis Date  . HIV infection   . Allergy   . Abnormal Pap smear     Had Cryo to treat  . Anemia    Past Surgical History  Procedure Laterality Date  . Abdominal hysterectomy    . Tubal ligation    . Joint replacement      left knee  . Knee arthroscopy     Family History  Problem Relation Age of Onset  . Cancer Mother     uterine and cervical  . Cancer Maternal Grandmother   . Cancer Maternal Grandfather   . Cancer Paternal Grandfather    History  Substance Use Topics  . Smoking status: Current Every Day Smoker -- 1.00 packs/day for 27 years    Types: Cigarettes  . Smokeless tobacco: Never Used  . Alcohol Use: Yes     Comment: occasional   OB History   Grav Para Term Preterm Abortions TAB SAB Ect Mult Living   7 2 2  5 3 2   2        Review of Systems Negative except as stated in HPI   Allergies  Sulfonamide derivatives  Home Medications   Prior to Admission medications   Medication Sig Start Date End Date Taking? Authorizing Provider  acetaminophen-codeine (TYLENOL #3) 300-30 MG per tablet TAKE TWO TABLETS BY MOUTH EVERY 4 HOURS AS NEEDED FOR MODERATE PAIN 12/26/13   [name], MD  alprazolam Prudy Feeler) 2 MG tablet TAKE ONE TABLET BY MOUTH TWICE DAILY 12/26/13   [name], MD  ATRIPLA 600-200-300 MG per tablet TAKE ONE TABLET BY MOUTH ONCE DAILY 07/02/13   [name], MD  cetirizine (ZYRTEC) 10 MG tablet Take 10 mg by mouth daily.    Historical Provider, MD  cyclobenzaprine (FLEXERIL) 10 MG tablet Take 1 tablet (10 mg total) by mouth 3 (three) times daily as needed for muscle spasms. 12/24/13   [name], MD  ibuprofen (ADVIL,MOTRIN) 800 MG tablet TAKE ONE TABLET BY MOUTH AS NEEDED FOR PAIN 12/26/13   [name], MD  Misc Natural Products (SINUS FORMULA PO) Take 3 tablets by mouth daily.    Historical Provider, MD  valACYclovir (VALTREX) 1000  MG tablet Take 1 tablet (1,000 mg total) by mouth 3 (three) times daily. 06/27/13   Ginnie SmartJeffrey C Hatcher, MD   BP 131/86  Pulse 76  Temp(Src) 99.4 F (37.4 C) (Oral)  Ht 5\' 4"  (1.626 m)  Wt 165 lb (74.844 kg)  BMI 28.31 kg/m2  SpO2 99% Physical Exam  Nursing note and vitals reviewed. Constitutional: She is oriented to person, place, and time. She appears well-developed and well-nourished. No distress.  HENT:  Head: Normocephalic.  Eyes: EOM are normal.  Neck: Neck supple.  Cardiovascular: Normal rate.   Pulmonary/Chest: Effort normal.  Musculoskeletal:       Right foot: She exhibits decreased range of motion, tenderness, bony tenderness and swelling. She exhibits normal capillary refill, no crepitus and no laceration.       Feet:  Tenderness and swelling of the dorsum of the right foot over the 5th MT extending to the lateral ankle.  Pedal pulse strong, adequate circulation, good touch sensation.   Neurological: She is alert and oriented to person, place, and time. No cranial nerve deficit.  Skin: Skin is warm and dry.  Psychiatric: She has a normal mood and affect. Her behavior is normal.   Dg Foot Complete Right  01/19/2014   CLINICAL DATA:  Right foot pain and swelling after twisting injury and fall.  EXAM: RIGHT FOOT COMPLETE - 3+ VIEW  COMPARISON:  None.  FINDINGS: Transverse nondisplaced fracture present at the base of the fifth metatarsal. No other fractures identified. Mild hallux valgus. No bony lesions. Soft tissues are unremarkable.  IMPRESSION: Nondisplaced Jones fracture at the base of the fifth metatarsal.   Electronically Signed   By: Irish LackGlenn  Yamagata M.D.   On: 01/19/2014 12:18    ED Course  Procedures   MDM  43 y.o. female with pain and swelling to the right foot that extends to the lateral aspect of the ankle s/p fall last night. Will treat for 5th MT fracture and ankle sprain. Placed in ASO, patient has crutches at home, ice, elevation and follow up with ortho. She will return here as needed for problems.I have reviewed this patient's vital signs, nurses notes, appropriate labs and imaging.  Stable for discharge and remains neurovascularly intact.    Medication List    TAKE these medications       oxyCODONE-acetaminophen 5-325 MG per tablet  Commonly known as:  ROXICET  Take 1 tablet by mouth every 4 (four) hours as needed.      ASK your doctor about these medications       acetaminophen-codeine 300-30 MG per tablet  Commonly known as:  TYLENOL #3  TAKE TWO TABLETS BY MOUTH EVERY 4 HOURS AS NEEDED FOR MODERATE PAIN     alprazolam 2 MG tablet  Commonly known as:  XANAX  Take 1-2 mg by mouth 2 (two) times daily as needed for anxiety.     alprazolam 2 MG tablet  Commonly known as:  XANAX  TAKE ONE TABLET BY MOUTH TWICE DAILY     ATRIPLA 600-200-300 MG per tablet  Generic drug:   efavirenz-emtricitabine-tenofovir  TAKE ONE TABLET BY MOUTH ONCE DAILY     cetirizine 10 MG tablet  Commonly known as:  ZYRTEC  Take 10 mg by mouth daily.     ibuprofen 800 MG tablet  Commonly known as:  ADVIL,MOTRIN  TAKE ONE TABLET BY MOUTH AS NEEDED FOR PAIN     phenylephrine 10 MG Tabs tablet  Commonly known as:  SUDAFED PE  Take 10 mg by mouth 2 (two) times daily.     VISINE OP  Apply 1 drop to eye daily as needed (red eyes).          [place], [place] 01/19/14 [phone]

## 2014-01-19 NOTE — ED Notes (Signed)
Patient with no complaints at this time. Respirations even and unlabored. Skin warm/dry. Discharge instructions reviewed with patient at this time. Patient given opportunity to voice concerns/ask questions. Patient discharged at this time and left Emergency Department with steady gait.   

## 2014-01-19 NOTE — Discharge Instructions (Signed)
Follow up with Dr. Hilda LiasKeeling or Dr. Romeo AppleHarrison. Do not drive if taking the narcotic pain medication as it will make you sleepy.

## 2014-01-19 NOTE — ED Notes (Signed)
Tripped and twisted foot under last night. Swelling noted to right foot. Pt reports she is unable to walk on foot.

## 2014-01-20 NOTE — ED Provider Notes (Signed)
Medical screening examination/treatment/procedure(s) were performed by non-physician practitioner and as supervising physician I was immediately available for consultation/collaboration.   EKG Interpretation None       Keishawna Carranza, MD 01/20/14 0729 

## 2014-01-22 DIAGNOSIS — S5290XD Unspecified fracture of unspecified forearm, subsequent encounter for closed fracture with routine healing: Secondary | ICD-10-CM | POA: Diagnosis not present

## 2014-01-23 ENCOUNTER — Other Ambulatory Visit: Payer: Self-pay | Admitting: Infectious Diseases

## 2014-01-24 ENCOUNTER — Telehealth: Payer: Self-pay | Admitting: *Deleted

## 2014-01-24 DIAGNOSIS — M546 Pain in thoracic spine: Secondary | ICD-10-CM

## 2014-01-24 MED ORDER — IBUPROFEN 800 MG PO TABS: ORAL_TABLET | ORAL | Status: DC

## 2014-01-24 NOTE — Telephone Encounter (Signed)
MD please advise about refills for Alprazolam 2mg  and Tylenol #3 rxes.  Reviewed last note in EPIC.  Pt seen in ED on 01/19/14 for broken toe.  Given Roxicet 5/325 #15 for this issue.

## 2014-01-25 NOTE — Telephone Encounter (Signed)
Spoke with patient and let her know that Dr. Ninetta LightsHatcher will have to address the other prescriptions when he returns to the office on Monday. I advised I was only calling in #6 of xanax per Dr. Luciana Axeomer

## 2014-01-25 NOTE — Telephone Encounter (Signed)
Ok to refill #6 of the alprazolam and Dr. Ninetta LightsHatcher can address the rest on 8/17 when he returns.

## 2014-01-30 NOTE — Telephone Encounter (Signed)
Patient called again regarding the refills on Xanax and Tylenol # 3. Please advise

## 2014-01-31 ENCOUNTER — Other Ambulatory Visit: Payer: Self-pay | Admitting: *Deleted

## 2014-01-31 DIAGNOSIS — M546 Pain in thoracic spine: Secondary | ICD-10-CM

## 2014-01-31 DIAGNOSIS — F411 Generalized anxiety disorder: Secondary | ICD-10-CM

## 2014-01-31 MED ORDER — ACETAMINOPHEN-CODEINE #3 300-30 MG PO TABS: ORAL_TABLET | ORAL | Status: DC

## 2014-01-31 MED ORDER — ALPRAZOLAM 2 MG PO TABS: ORAL_TABLET | ORAL | Status: DC

## 2014-01-31 NOTE — Telephone Encounter (Signed)
Ok to refill 

## 2014-01-31 NOTE — Telephone Encounter (Signed)
Pt calling to check on xanax and Tylenol #3 refills.  MD please see previous documentation about the Tylenol #3 and Xanax refills.  MD, please advise about # of tablets for each and any refill for each.

## 2014-02-01 NOTE — Telephone Encounter (Signed)
Spoke with Hermenia BersJohnna, Tylenol 3 held at [facility].

## 2014-02-01 NOTE — Telephone Encounter (Signed)
[facility] Jonelle with a duplicate controlled substance flag.  Patient received the previously documented #15 5/325 Roxicet on 8/8 from the ed    AND AN ADDITIONAL  #80 10/325 Percocet on 8/11 from Dr. Darrelyn HillockGioffre (via his PA, f/u appt 8/24 at 10:15).  Per the pharmacist, she should have enough Percocet through 8/24 (Monday).    Her Tylenol 3 was due for refill 7/15 but IS ON HOLD UNTIL RELEASED BY DR. HATCHER. She should have 1 week's worth of Tylenol 3 based on other rx dates. Andree CossHowell, Muneeb Veras M, RN

## 2014-02-01 NOTE — Telephone Encounter (Signed)
Do not refill Tylenol #3.

## 2014-02-04 DIAGNOSIS — S8290XD Unspecified fracture of unspecified lower leg, subsequent encounter for closed fracture with routine healing: Secondary | ICD-10-CM | POA: Diagnosis not present

## 2014-02-11 DIAGNOSIS — S8290XD Unspecified fracture of unspecified lower leg, subsequent encounter for closed fracture with routine healing: Secondary | ICD-10-CM | POA: Diagnosis not present

## 2014-02-28 ENCOUNTER — Other Ambulatory Visit: Payer: Self-pay | Admitting: Infectious Diseases

## 2014-03-11 ENCOUNTER — Ambulatory Visit (HOSPITAL_COMMUNITY): Payer: Medicare Other

## 2014-03-11 ENCOUNTER — Encounter: Payer: Medicare Other | Admitting: Obstetrics & Gynecology

## 2014-03-27 ENCOUNTER — Telehealth: Payer: Self-pay | Admitting: *Deleted

## 2014-03-27 ENCOUNTER — Other Ambulatory Visit: Payer: Self-pay | Admitting: Infectious Diseases

## 2014-03-27 DIAGNOSIS — M546 Pain in thoracic spine: Secondary | ICD-10-CM

## 2014-03-27 DIAGNOSIS — F411 Generalized anxiety disorder: Secondary | ICD-10-CM

## 2014-03-27 NOTE — Telephone Encounter (Signed)
Called in is fine thanks

## 2014-03-27 NOTE — Telephone Encounter (Signed)
Pt last filled alprazolam #60 on 02/28/14 and Tylenol #3 on 02/19/14 at [facility] [place].  Dr. Ninetta LightsHatcher do you wanted refills printed and for your signature or do you wanted them called to [facility]?  Please advise.

## 2014-03-28 MED ORDER — ALPRAZOLAM 2 MG PO TABS: ORAL_TABLET | ORAL | Status: DC

## 2014-03-28 MED ORDER — ACETAMINOPHEN-CODEINE #3 300-30 MG PO TABS: ORAL_TABLET | ORAL | Status: DC

## 2014-03-28 NOTE — Addendum Note (Signed)
Addended by: Jennet MaduroESTRIDGE, Phynix Horton D on: 03/28/2014 11:34 AM   Modules accepted: Orders

## 2014-03-29 ENCOUNTER — Ambulatory Visit (HOSPITAL_COMMUNITY): Payer: Medicare Other

## 2014-04-09 ENCOUNTER — Telehealth: Payer: Self-pay | Admitting: *Deleted

## 2014-04-09 DIAGNOSIS — M546 Pain in thoracic spine: Secondary | ICD-10-CM

## 2014-04-09 MED ORDER — IBUPROFEN 800 MG PO TABS: ORAL_TABLET | ORAL | Status: DC

## 2014-04-09 NOTE — Telephone Encounter (Signed)
Per the patient request for refill

## 2014-04-15 ENCOUNTER — Encounter (HOSPITAL_COMMUNITY): Payer: Self-pay | Admitting: Emergency Medicine

## 2014-04-24 ENCOUNTER — Other Ambulatory Visit: Payer: Self-pay | Admitting: Internal Medicine

## 2014-04-24 ENCOUNTER — Other Ambulatory Visit: Payer: Self-pay | Admitting: Infectious Diseases

## 2014-04-25 ENCOUNTER — Telehealth: Payer: Self-pay | Admitting: *Deleted

## 2014-04-25 DIAGNOSIS — M546 Pain in thoracic spine: Secondary | ICD-10-CM

## 2014-04-25 DIAGNOSIS — F411 Generalized anxiety disorder: Secondary | ICD-10-CM

## 2014-04-25 MED ORDER — ALPRAZOLAM 2 MG PO TABS: ORAL_TABLET | ORAL | Status: DC

## 2014-04-25 MED ORDER — ACETAMINOPHEN-CODEINE #3 300-30 MG PO TABS: ORAL_TABLET | ORAL | Status: DC

## 2014-04-29 ENCOUNTER — Other Ambulatory Visit: Payer: Medicare Other

## 2014-04-29 ENCOUNTER — Encounter: Payer: Medicare Other | Admitting: Family Medicine

## 2014-04-29 ENCOUNTER — Ambulatory Visit (HOSPITAL_COMMUNITY): Payer: Medicare Other

## 2014-05-13 ENCOUNTER — Ambulatory Visit (HOSPITAL_COMMUNITY)
Admission: RE | Admit: 2014-05-13 | Discharge: 2014-05-13 | Disposition: A | Discharge status: Home / Self Care | Payer: Medicare Other | Source: Ambulatory Visit | Attending: Infectious Diseases | Admitting: Infectious Diseases

## 2014-05-13 ENCOUNTER — Other Ambulatory Visit: Payer: Medicare Other

## 2014-05-13 ENCOUNTER — Ambulatory Visit: Payer: Medicare Other | Admitting: Infectious Diseases

## 2014-05-13 DIAGNOSIS — S62336A Displaced fracture of neck of fifth metacarpal bone, right hand, initial encounter for closed fracture: Secondary | ICD-10-CM | POA: Diagnosis not present

## 2014-05-13 DIAGNOSIS — Z1231 Encounter for screening mammogram for malignant neoplasm of breast: Secondary | ICD-10-CM | POA: Insufficient documentation

## 2014-05-13 DIAGNOSIS — B20 Human immunodeficiency virus [HIV] disease: Secondary | ICD-10-CM

## 2014-05-13 LAB — CBC WITH DIFFERENTIAL/PLATELET
Basophils Absolute: 0.1 10*3/uL (ref 0.0–0.1)
Basophils Relative: 1 % (ref 0–1)
EOS PCT: 2 % (ref 0–5)
Eosinophils Absolute: 0.1 10*3/uL (ref 0.0–0.7)
HEMATOCRIT: 37.7 % (ref 36.0–46.0)
Hemoglobin: 13.2 g/dL (ref 12.0–15.0)
LYMPHS ABS: 2 10*3/uL (ref 0.7–4.0)
Lymphocytes Relative: 39 % (ref 12–46)
MCH: 33.5 pg (ref 26.0–34.0)
MCHC: 35 g/dL (ref 30.0–36.0)
MCV: 95.7 fL (ref 78.0–100.0)
MONO ABS: 0.3 10*3/uL (ref 0.1–1.0)
MONOS PCT: 5 % (ref 3–12)
MPV: 10 fL (ref 9.4–12.4)
Neutro Abs: 2.7 10*3/uL (ref 1.7–7.7)
Neutrophils Relative %: 53 % (ref 43–77)
Platelets: 205 10*3/uL (ref 150–400)
RBC: 3.94 MIL/uL (ref 3.87–5.11)
RDW: 14.1 % (ref 11.5–15.5)
WBC: 5.1 10*3/uL (ref 4.0–10.5)

## 2014-05-13 LAB — COMPLETE METABOLIC PANEL WITH GFR
ALT: 16 U/L (ref 0–35)
AST: 19 U/L (ref 0–37)
Albumin: 4.4 g/dL (ref 3.5–5.2)
Alkaline Phosphatase: 61 U/L (ref 39–117)
BUN: 15 mg/dL (ref 6–23)
CALCIUM: 9.3 mg/dL (ref 8.4–10.5)
CO2: 29 meq/L (ref 19–32)
Chloride: 104 mEq/L (ref 96–112)
Creat: 0.72 mg/dL (ref 0.50–1.10)
GFR, Est African American: >89 mL/min
GLUCOSE: 75 mg/dL (ref 70–99)
POTASSIUM: 4 meq/L (ref 3.5–5.3)
Sodium: 142 mEq/L (ref 135–145)
Total Bilirubin: 0.3 mg/dL (ref 0.2–1.2)
Total Protein: 6.6 g/dL (ref 6.0–8.3)

## 2014-05-14 LAB — HIV-1 RNA QUANT-NO REFLEX-BLD
HIV 1 RNA Quant: <20 copies/mL (ref <20)
HIV-1 RNA Quant, Log: <1.3 {Log} (ref <1.30)

## 2014-05-14 LAB — T-HELPER CELL (CD4) - (RCID CLINIC ONLY)
CD4 % Helper T Cell: 32 % — ABNORMAL LOW (ref 33–55)
CD4 T Cell Abs: 600 /uL (ref 400–2700)

## 2014-05-22 ENCOUNTER — Other Ambulatory Visit: Payer: Self-pay | Admitting: Infectious Diseases

## 2014-05-22 NOTE — Telephone Encounter (Addendum)
MD, please authorize refills.   Phoned refills to [facility].

## 2014-06-03 ENCOUNTER — Ambulatory Visit: Payer: Medicare Other | Admitting: Infectious Diseases

## 2014-06-21 ENCOUNTER — Other Ambulatory Visit: Payer: Self-pay | Admitting: Infectious Diseases

## 2014-06-24 ENCOUNTER — Encounter: Payer: Self-pay | Admitting: Obstetrics & Gynecology

## 2014-06-24 ENCOUNTER — Ambulatory Visit (INDEPENDENT_AMBULATORY_CARE_PROVIDER_SITE_OTHER): Payer: Medicare Other | Admitting: Family Medicine

## 2014-06-24 ENCOUNTER — Other Ambulatory Visit (HOSPITAL_COMMUNITY)
Admission: RE | Admit: 2014-06-24 | Discharge: 2014-06-24 | Disposition: A | Discharge status: Home / Self Care | Payer: Medicare Other | Source: Ambulatory Visit | Attending: Family Medicine | Admitting: Family Medicine

## 2014-06-24 VITALS — BP 135/88 | HR 69 | Temp 98.6°F | Ht 64.0 in | Wt 171.4 lb

## 2014-06-24 DIAGNOSIS — Z124 Encounter for screening for malignant neoplasm of cervix: Secondary | ICD-10-CM | POA: Diagnosis not present

## 2014-06-24 DIAGNOSIS — Z1151 Encounter for screening for human papillomavirus (HPV): Secondary | ICD-10-CM

## 2014-06-24 DIAGNOSIS — Z01419 Encounter for gynecological examination (general) (routine) without abnormal findings: Secondary | ICD-10-CM

## 2014-06-24 DIAGNOSIS — Z118 Encounter for screening for other infectious and parasitic diseases: Secondary | ICD-10-CM

## 2014-06-24 DIAGNOSIS — Z113 Encounter for screening for infections with a predominantly sexual mode of transmission: Secondary | ICD-10-CM

## 2014-06-24 NOTE — Progress Notes (Signed)
Pt desires STD testing

## 2014-06-24 NOTE — Progress Notes (Signed)
  Subjective:     Lori BoozeCristina J [name] is a 44 y.o. female [phone] who is here for a comprehensive physical exam. The patient reports no problems. Patient is HIV positive (CD4 600). She has a history of abnormal pap smear treated with cryotherapy, LEEP and hysterectomy in ?2008.  Normal pap 2013, 2014  Would also like STD testing as recently had sex.  Vitamins: none currently Current smoker 1 pack daily, considering quitting   History   Social History  . Marital Status: Single    Spouse Name: N/A    Number of Children: N/A  . Years of Education: N/A   Occupational History  . Not on file.   Social History Main Topics  . Smoking status: Current Every Day Smoker -- 1.00 packs/day for 27 years    Types: Cigarettes  . Smokeless tobacco: Never Used  . Alcohol Use: Yes     Comment: occasional  . Drug Use: 7.00 per week    Special: Marijuana  . Sexual Activity: Yes    Birth Control/ Protection: Surgical, Condom     Comment: pt. declined condoms   Other Topics Concern  . Not on file   Social History Narrative   Health Maintenance  Topic Date Due  . Janet BerlinETANUS/TDAP  12/20/1989  . PAP SMEAR  09/13/2013  . INFLUENZA VACCINE  01/12/2014       Review of Systems A comprehensive review of systems was negative.   Objective:      GENERAL: Well-developed, well-nourished female in no acute distress.  HEENT: Normocephalic, atraumatic. Sclerae anicteric.  NECK: Supple. Normal thyroid.  LUNGS: Clear to auscultation bilaterally.  HEART: Regular rate and rhythm. BREASTS: Symmetric in size. No palpable masses or lymphadenopathy, skin changes, or nipple drainage. ABDOMEN: Soft, nontender, nondistended. No organomegaly. PELVIC: Normal external female genitalia. Vagina is pink and rugated.  Normal vaginal vault. No adnexal mass or tenderness. Small mole-like lesions that are 2 mm in size scattered on vagina, labia and peri-anal but not on anus. EXTREMITIES: No cyanosis, clubbing, or edema,  2+ distal pulses.    Assessment:    Healthy female exam.      Plan:    Vaginal pap smear performed Patient with h/o abnormal pap smear s/p cryo, LEEP, hysterectomy in ?2008 with 2 normal paps recently. Mammogram normal, continue with screening G/c today, will notify for any abnormal results Genital warts: declines tx today, will consider in future. Pt to return to clinic yearly for visual inspection, no longer needs pap smear.

## 2014-06-25 LAB — CYTOLOGY - PAP

## 2014-07-02 ENCOUNTER — Other Ambulatory Visit: Payer: Self-pay | Admitting: Infectious Diseases

## 2014-07-04 ENCOUNTER — Telehealth: Payer: Self-pay | Admitting: Infectious Diseases

## 2014-07-04 ENCOUNTER — Other Ambulatory Visit: Payer: Self-pay | Admitting: Infectious Diseases

## 2014-07-04 ENCOUNTER — Telehealth: Payer: Self-pay | Admitting: *Deleted

## 2014-07-04 DIAGNOSIS — F17200 Nicotine dependence, unspecified, uncomplicated: Secondary | ICD-10-CM

## 2014-07-04 MED ORDER — BUPROPION HCL ER (SR) 150 MG PO TB12: 150.0000 mg | ORAL_TABLET | Freq: Two times a day (BID) | ORAL | Status: DC

## 2014-07-04 NOTE — Telephone Encounter (Signed)
Pt wants to start wellbutrin. Will send in rx

## 2014-07-04 NOTE — Telephone Encounter (Signed)
Pt requesting rx for Chantix, wants to quit smoking.  Wants rx sent to [facility] in [place].

## 2014-07-08 ENCOUNTER — Telehealth: Payer: Self-pay | Admitting: *Deleted

## 2014-07-08 ENCOUNTER — Ambulatory Visit: Payer: Medicare Other | Admitting: Infectious Diseases

## 2014-07-08 NOTE — Telephone Encounter (Signed)
Pt contacted the clinic requesting pap smear results.   Contacted patient, results given, Pt verbalizes understanding and does not have any further questions.

## 2014-07-18 ENCOUNTER — Other Ambulatory Visit: Payer: Self-pay | Admitting: Infectious Diseases

## 2014-07-22 ENCOUNTER — Ambulatory Visit (INDEPENDENT_AMBULATORY_CARE_PROVIDER_SITE_OTHER): Payer: Medicare Other | Admitting: Infectious Diseases

## 2014-07-22 ENCOUNTER — Encounter: Payer: Self-pay | Admitting: Infectious Diseases

## 2014-07-22 ENCOUNTER — Other Ambulatory Visit: Payer: Self-pay | Admitting: Infectious Diseases

## 2014-07-22 ENCOUNTER — Other Ambulatory Visit: Payer: Self-pay | Admitting: *Deleted

## 2014-07-22 VITALS — BP 133/94 | HR 75 | Temp 98.0°F | Wt 171.0 lb

## 2014-07-22 DIAGNOSIS — R51 Headache: Secondary | ICD-10-CM | POA: Diagnosis not present

## 2014-07-22 DIAGNOSIS — Z113 Encounter for screening for infections with a predominantly sexual mode of transmission: Secondary | ICD-10-CM

## 2014-07-22 DIAGNOSIS — B20 Human immunodeficiency virus [HIV] disease: Secondary | ICD-10-CM | POA: Diagnosis not present

## 2014-07-22 DIAGNOSIS — F411 Generalized anxiety disorder: Secondary | ICD-10-CM

## 2014-07-22 DIAGNOSIS — I10 Essential (primary) hypertension: Secondary | ICD-10-CM | POA: Diagnosis not present

## 2014-07-22 DIAGNOSIS — Z720 Tobacco use: Secondary | ICD-10-CM

## 2014-07-22 DIAGNOSIS — G8929 Other chronic pain: Secondary | ICD-10-CM

## 2014-07-22 DIAGNOSIS — F17200 Nicotine dependence, unspecified, uncomplicated: Secondary | ICD-10-CM

## 2014-07-22 DIAGNOSIS — R519 Headache, unspecified: Secondary | ICD-10-CM

## 2014-07-22 DIAGNOSIS — Z23 Encounter for immunization: Secondary | ICD-10-CM

## 2014-07-22 DIAGNOSIS — Z79899 Other long term (current) drug therapy: Secondary | ICD-10-CM

## 2014-07-22 MED ORDER — ALPRAZOLAM 2 MG PO TABS: 2.0000 mg | ORAL_TABLET | Freq: Two times a day (BID) | ORAL | Status: DC

## 2014-07-22 MED ORDER — NICOTINE 14 MG/24HR TD PT24: 14.0000 mg | MEDICATED_PATCH | Freq: Every day | TRANSDERMAL | Status: DC

## 2014-07-22 MED ORDER — ACETAMINOPHEN-CODEINE #3 300-30 MG PO TABS: ORAL_TABLET | ORAL | Status: DC

## 2014-07-22 NOTE — Assessment & Plan Note (Signed)
Will refill her xanax 

## 2014-07-22 NOTE — Assessment & Plan Note (Signed)
She is doing well. Offered/refused condoms. Refuses flu shot. Hep B serologies at next visit. Will see her back in 6 months.

## 2014-07-22 NOTE — Assessment & Plan Note (Signed)
Will refill her T3. She uses ~1/2 bid.

## 2014-07-22 NOTE — Progress Notes (Signed)
   Subjective:    Patient ID: Lori [name], female    DOB: 11/02/1970, 44 y.o.   MRN: [id number]  HPI 44 yo F with HIV+, anxiety d/o, anal/vaginal warts.  No problems with atripla.  Had PAP 09-2012 NL, 06-24-14 NL.   Had HRA 01-2013- no internal lesions, given podophylin for small external warts.  Has tried wellbutrin but made her nauseated. Still wants to stop, "coughing a lot". Would like to try nicotine patches.   HIV 1 RNA QUANT (copies/mL)  Date Value  05/13/2014 <20  12/10/2013 <20  06/25/2013 <20   CD4 T CELL ABS (/uL)  Date Value  05/13/2014 600  12/10/2013 640  06/25/2013 380*     Review of Systems  Constitutional: Negative for fever, chills, appetite change and unexpected weight change.  Respiratory: Positive for cough and shortness of breath.   Gastrointestinal: Negative for diarrhea and constipation.  Genitourinary: Negative for difficulty urinating.       Objective:   Physical Exam  Constitutional: She appears well-developed and well-nourished.  HENT:  Mouth/Throat: No oropharyngeal exudate.  Eyes: EOM are normal. Pupils are equal, round, and reactive to light.  Neck: Neck supple.  Cardiovascular: Normal rate, regular rhythm and normal heart sounds.   Pulmonary/Chest: Effort normal and breath sounds normal.  Abdominal: Soft. Bowel sounds are normal. There is no tenderness. There is no rebound.  Lymphadenopathy:    She has no cervical adenopathy.          Assessment & Plan:

## 2014-07-22 NOTE — Assessment & Plan Note (Signed)
She is taking sudafed daily. Encouraged her to stop this and try zyrtec or claritin or allegra.

## 2014-07-22 NOTE — Assessment & Plan Note (Signed)
Will start her on nicotine patches.  

## 2014-08-15 ENCOUNTER — Other Ambulatory Visit: Payer: Self-pay | Admitting: *Deleted

## 2014-08-15 DIAGNOSIS — R51 Headache: Principal | ICD-10-CM

## 2014-08-15 DIAGNOSIS — G8929 Other chronic pain: Secondary | ICD-10-CM

## 2014-08-15 DIAGNOSIS — F411 Generalized anxiety disorder: Secondary | ICD-10-CM

## 2014-08-15 MED ORDER — IBUPROFEN 800 MG PO TABS: ORAL_TABLET | ORAL | Status: DC

## 2014-08-15 MED ORDER — ALPRAZOLAM 2 MG PO TABS: 2.0000 mg | ORAL_TABLET | Freq: Two times a day (BID) | ORAL | Status: DC

## 2014-08-15 MED ORDER — ACETAMINOPHEN-CODEINE #3 300-30 MG PO TABS: ORAL_TABLET | ORAL | Status: DC

## 2014-09-16 ENCOUNTER — Telehealth: Payer: Self-pay | Admitting: *Deleted

## 2014-09-16 ENCOUNTER — Other Ambulatory Visit: Payer: Self-pay | Admitting: *Deleted

## 2014-09-16 DIAGNOSIS — F411 Generalized anxiety disorder: Secondary | ICD-10-CM

## 2014-09-16 DIAGNOSIS — B20 Human immunodeficiency virus [HIV] disease: Secondary | ICD-10-CM

## 2014-09-16 MED ORDER — ALPRAZOLAM 2 MG PO TABS: 2.0000 mg | ORAL_TABLET | Freq: Two times a day (BID) | ORAL | Status: DC

## 2014-09-16 MED ORDER — EFAVIRENZ-EMTRICITAB-TENOFOVIR 600-200-300 MG PO TABS: 1.0000 | ORAL_TABLET | Freq: Every day | ORAL | Status: DC

## 2014-09-16 NOTE — Telephone Encounter (Signed)
Verbal order.  OK to fill with one refill per Dr. Ninetta LightsHatcher.

## 2014-09-19 ENCOUNTER — Other Ambulatory Visit: Payer: Self-pay | Admitting: *Deleted

## 2014-09-19 DIAGNOSIS — R51 Headache: Principal | ICD-10-CM

## 2014-09-19 DIAGNOSIS — G8929 Other chronic pain: Secondary | ICD-10-CM

## 2014-09-19 MED ORDER — ACETAMINOPHEN-CODEINE #3 300-30 MG PO TABS: ORAL_TABLET | ORAL | Status: DC

## 2014-10-15 ENCOUNTER — Other Ambulatory Visit: Payer: Self-pay | Admitting: Infectious Diseases

## 2014-10-15 DIAGNOSIS — G44209 Tension-type headache, unspecified, not intractable: Secondary | ICD-10-CM

## 2014-10-16 NOTE — Telephone Encounter (Signed)
Obtained verbal order to refill medication from Dr. Jolayne HainesJ. Hatcher.

## 2014-10-25 ENCOUNTER — Telehealth: Payer: Self-pay | Admitting: *Deleted

## 2014-10-25 NOTE — Telephone Encounter (Signed)
The patient called and advised she has been having headaches for the past 2 weeks. She advised they are not getting any better and she is worried and wants to have an appt. Gave her the first available but advised her what to look for and to go the the ED if she gets worse.

## 2014-10-31 ENCOUNTER — Ambulatory Visit (INDEPENDENT_AMBULATORY_CARE_PROVIDER_SITE_OTHER): Payer: Medicare Other | Admitting: Internal Medicine

## 2014-10-31 VITALS — BP 153/102 | HR 61 | Temp 98.5°F | Wt 171.5 lb

## 2014-10-31 DIAGNOSIS — G44021 Chronic cluster headache, intractable: Secondary | ICD-10-CM

## 2014-10-31 NOTE — Progress Notes (Signed)
   Subjective:    Patient ID: Lori [name], female    DOB: 06/22/1970, 44 y.o.   MRN: [id number]  HPI She comes in here for a work in visit. She is followed by Dr. Ninetta LightsHatcher for her HIV and anxiety. She has chronic headaches for which she takes Tylenol 3 but recently had Ace band of about 2 weeks of an unremitting migraine type headache. She said no medications helped. This has though since of improved and back to her baseline daily headaches. She did miss 2 days of work.  She does drink caffeine in the form of coffee and [employer] all day long. She also takes multiple medications for pain and anxiety. She smokes.  Her blood pressure today is elevated.  She also feels she is getting hot flashes.  She also had noted some weakness and imbalance that also has resolved. She says she has tried migraine medicine past and it did not help.   Review of Systems  Constitutional: Negative for fever, chills, fatigue and unexpected weight change.  HENT: Negative for trouble swallowing.   Gastrointestinal: Negative for nausea and diarrhea.  Skin: Negative for rash.  Neurological: Positive for headaches. Negative for dizziness and light-headedness.  Psychiatric/Behavioral: Positive for sleep disturbance. The patient is nervous/anxious.        Objective:   Physical Exam  Constitutional: She appears well-developed and well-nourished. No distress.  Eyes: EOM are normal. No scleral icterus.  Cardiovascular: Normal rate, regular rhythm and normal heart sounds.   No murmur heard. Pulmonary/Chest: Effort normal and breath sounds normal. No respiratory distress. She has no wheezes.  Musculoskeletal: She exhibits no edema.  Lymphadenopathy:    She has no cervical adenopathy.  Neurological: She is alert. She has normal strength. No cranial nerve deficit.  Skin: No rash noted.          Assessment & Plan:

## 2014-10-31 NOTE — Assessment & Plan Note (Addendum)
I discussed with her the different causes of headaches. Asked headache hygiene including limiting caffeine, smoking cessation, blood pressure control. At this time, she is having no particular migraine and she is not interested in readdressing abortive therapy. I did discuss having her see by headache specialist however she doesn't think this will benefit her and is not interested. She is going to try to reduce the caffeine intake and attempt smoking cessation and she will otherwise keep her routine follow-up with her primary provider. 1A5 minutes spent with patient including 15 minutes of discussion of her headaches.

## 2014-11-13 ENCOUNTER — Other Ambulatory Visit: Payer: Self-pay | Admitting: Infectious Diseases

## 2014-11-18 ENCOUNTER — Other Ambulatory Visit: Payer: Self-pay | Admitting: *Deleted

## 2014-11-18 MED ORDER — IBUPROFEN 800 MG PO TABS: ORAL_TABLET | ORAL | Status: DC

## 2014-12-12 ENCOUNTER — Telehealth: Payer: Self-pay | Admitting: *Deleted

## 2014-12-12 DIAGNOSIS — G44009 Cluster headache syndrome, unspecified, not intractable: Secondary | ICD-10-CM

## 2014-12-12 DIAGNOSIS — R519 Headache, unspecified: Secondary | ICD-10-CM

## 2014-12-12 DIAGNOSIS — F419 Anxiety disorder, unspecified: Secondary | ICD-10-CM

## 2014-12-12 DIAGNOSIS — R51 Headache: Principal | ICD-10-CM

## 2014-12-12 MED ORDER — ALPRAZOLAM 2 MG PO TABS: 2.0000 mg | ORAL_TABLET | Freq: Two times a day (BID) | ORAL | Status: DC

## 2014-12-12 MED ORDER — ACETAMINOPHEN-CODEINE #3 300-30 MG PO TABS: ORAL_TABLET | ORAL | Status: DC

## 2014-12-12 NOTE — Telephone Encounter (Signed)
Called pt with refill info.

## 2014-12-13 ENCOUNTER — Other Ambulatory Visit: Payer: Self-pay | Admitting: *Deleted

## 2014-12-13 MED ORDER — IBUPROFEN 800 MG PO TABS: ORAL_TABLET | ORAL | Status: DC

## 2015-01-13 ENCOUNTER — Other Ambulatory Visit: Payer: Self-pay | Admitting: Infectious Diseases

## 2015-01-15 ENCOUNTER — Telehealth: Payer: Self-pay | Admitting: *Deleted

## 2015-01-15 DIAGNOSIS — G44009 Cluster headache syndrome, unspecified, not intractable: Secondary | ICD-10-CM

## 2015-01-15 DIAGNOSIS — F419 Anxiety disorder, unspecified: Secondary | ICD-10-CM

## 2015-01-15 MED ORDER — ACETAMINOPHEN-CODEINE #3 300-30 MG PO TABS: ORAL_TABLET | ORAL | Status: DC

## 2015-01-15 MED ORDER — ALPRAZOLAM 2 MG PO TABS: 2.0000 mg | ORAL_TABLET | Freq: Two times a day (BID) | ORAL | Status: DC

## 2015-01-15 NOTE — Telephone Encounter (Signed)
Pt requested refills

## 2015-01-20 ENCOUNTER — Other Ambulatory Visit: Payer: Medicare Other

## 2015-02-03 ENCOUNTER — Ambulatory Visit: Payer: Medicare Other | Admitting: Infectious Diseases

## 2015-02-12 ENCOUNTER — Other Ambulatory Visit: Payer: Self-pay | Admitting: Infectious Diseases

## 2015-02-14 ENCOUNTER — Other Ambulatory Visit: Payer: Self-pay | Admitting: *Deleted

## 2015-02-14 DIAGNOSIS — F419 Anxiety disorder, unspecified: Secondary | ICD-10-CM

## 2015-02-14 DIAGNOSIS — B20 Human immunodeficiency virus [HIV] disease: Secondary | ICD-10-CM

## 2015-02-14 DIAGNOSIS — G44009 Cluster headache syndrome, unspecified, not intractable: Secondary | ICD-10-CM

## 2015-02-14 MED ORDER — ACETAMINOPHEN-CODEINE #3 300-30 MG PO TABS: ORAL_TABLET | ORAL | Status: DC

## 2015-02-14 MED ORDER — EFAVIRENZ-EMTRICITAB-TENOFOVIR 600-200-300 MG PO TABS: 1.0000 | ORAL_TABLET | Freq: Every day | ORAL | Status: DC

## 2015-02-14 MED ORDER — ALPRAZOLAM 2 MG PO TABS: 2.0000 mg | ORAL_TABLET | Freq: Two times a day (BID) | ORAL | Status: DC

## 2015-03-03 ENCOUNTER — Other Ambulatory Visit: Payer: Medicare Other

## 2015-03-03 DIAGNOSIS — B20 Human immunodeficiency virus [HIV] disease: Secondary | ICD-10-CM

## 2015-03-03 DIAGNOSIS — Z113 Encounter for screening for infections with a predominantly sexual mode of transmission: Secondary | ICD-10-CM

## 2015-03-03 DIAGNOSIS — Z79899 Other long term (current) drug therapy: Secondary | ICD-10-CM

## 2015-03-03 NOTE — Addendum Note (Signed)
Addended by: Alesia Morin F on: 03/03/2015 11:30 AM   Modules accepted: Orders

## 2015-03-04 LAB — T-HELPER CELL (CD4) - (RCID CLINIC ONLY)
CD4 % Helper T Cell: 29 % — ABNORMAL LOW (ref 33–55)
CD4 T CELL ABS: 570 /uL (ref 400–2700)

## 2015-03-04 LAB — HIV-1 RNA QUANT-NO REFLEX-BLD
HIV 1 RNA Quant: <20 copies/mL (ref <20)
HIV-1 RNA Quant, Log: <1.3 {Log} (ref <1.30)

## 2015-03-17 ENCOUNTER — Encounter: Payer: Self-pay | Admitting: Infectious Diseases

## 2015-03-17 ENCOUNTER — Other Ambulatory Visit: Payer: Self-pay | Admitting: Infectious Diseases

## 2015-03-17 ENCOUNTER — Ambulatory Visit (INDEPENDENT_AMBULATORY_CARE_PROVIDER_SITE_OTHER): Payer: Medicare Other | Admitting: Infectious Diseases

## 2015-03-17 VITALS — BP 127/84 | HR 57 | Temp 98.1°F | Wt 169.0 lb

## 2015-03-17 DIAGNOSIS — F17200 Nicotine dependence, unspecified, uncomplicated: Secondary | ICD-10-CM | POA: Diagnosis not present

## 2015-03-17 DIAGNOSIS — I10 Essential (primary) hypertension: Secondary | ICD-10-CM

## 2015-03-17 DIAGNOSIS — Z23 Encounter for immunization: Secondary | ICD-10-CM

## 2015-03-17 DIAGNOSIS — B20 Human immunodeficiency virus [HIV] disease: Secondary | ICD-10-CM | POA: Diagnosis not present

## 2015-03-17 DIAGNOSIS — A630 Anogenital (venereal) warts: Secondary | ICD-10-CM

## 2015-03-17 DIAGNOSIS — F419 Anxiety disorder, unspecified: Secondary | ICD-10-CM

## 2015-03-17 DIAGNOSIS — G43809 Other migraine, not intractable, without status migrainosus: Secondary | ICD-10-CM

## 2015-03-17 DIAGNOSIS — Z79899 Other long term (current) drug therapy: Secondary | ICD-10-CM

## 2015-03-17 DIAGNOSIS — G44009 Cluster headache syndrome, unspecified, not intractable: Secondary | ICD-10-CM

## 2015-03-17 DIAGNOSIS — Z113 Encounter for screening for infections with a predominantly sexual mode of transmission: Secondary | ICD-10-CM | POA: Diagnosis not present

## 2015-03-17 MED ORDER — EMTRICITAB-RILPIVIR-TENOFOV AF 200-25-25 MG PO TABS: 1.0000 | ORAL_TABLET | Freq: Every day | ORAL | Status: DC

## 2015-03-17 MED ORDER — ALPRAZOLAM 2 MG PO TABS
2.0000 mg | ORAL_TABLET | Freq: Two times a day (BID) | ORAL | Status: DC
Start: 2015-03-17 — End: 2015-04-14

## 2015-03-17 MED ORDER — PODOFILOX 0.5 % EX GEL: Freq: Two times a day (BID) | CUTANEOUS | Status: DC

## 2015-03-17 MED ORDER — ACETAMINOPHEN-CODEINE #3 300-30 MG PO TABS: ORAL_TABLET | ORAL | Status: DC

## 2015-03-17 NOTE — Assessment & Plan Note (Signed)
Encouraged to stop. 

## 2015-03-17 NOTE — Assessment & Plan Note (Signed)
Well controlled today.  Will continue to watch.

## 2015-03-17 NOTE — Assessment & Plan Note (Signed)
Not sexually active Will change atripla to odefsy- explained food requirements. Lack of sleep effects.  Will see her back in 4 months Will give flu shot

## 2015-03-17 NOTE — Assessment & Plan Note (Signed)
She will f/u with GYN Will refll her podofylin.

## 2015-03-17 NOTE — Progress Notes (Signed)
   Subjective:    Patient ID: Lori [name], female    DOB: 02/14/1971, 44 y.o.   MRN: [id number]  HPI 44 yo F with HIV+, anxiety d/o, anal/vaginal warts.  No problems with atripla.  Had PAP 09-2012 NL, 06-24-14 NL.  Had HRA 01-2013- no internal lesions, given podophylin for small external warts. She has had further lesions, will go see her gyn. Would like refill of podophyllin.   Been feeling alright. Has stopped taking pseudofed and her headaches are better. Allergies better controlled with zyrtec.   Not sexually active.  Getting refills of Tylenol 3, xanax, atripla.  Having occas muscle aches, arm and neck pain, side. Migratory.  1.5 ppd Here with her son Lori [name]).   HIV 1 RNA QUANT (copies/mL)  Date Value  03/03/2015 <20  05/13/2014 <20  12/10/2013 <20   CD4 T CELL ABS (/uL)  Date Value  03/03/2015 570  05/13/2014 600  12/10/2013 640    Review of Systems  Constitutional: Negative for appetite change and unexpected weight change.  Respiratory: Negative for cough and shortness of breath.   Gastrointestinal: Negative for diarrhea and constipation.  Genitourinary: Negative for difficulty urinating.  Neurological: Negative for headaches.       Objective:   Physical Exam  Constitutional: She appears well-developed and well-nourished.  HENT:  Mouth/Throat: No oropharyngeal exudate.  Eyes: EOM are normal. Pupils are equal, round, and reactive to light.  Neck: Neck supple.  Cardiovascular: Normal rate, regular rhythm and normal heart sounds.   Pulmonary/Chest: Effort normal and breath sounds normal.  Abdominal: Soft. Bowel sounds are normal. There is no tenderness.  Musculoskeletal: She exhibits no edema.  Lymphadenopathy:    She has no cervical adenopathy.        Assessment & Plan:

## 2015-03-17 NOTE — Assessment & Plan Note (Signed)
Doing well, tylenol 3 prn  Better was BP improved - suggests not migraine

## 2015-04-01 ENCOUNTER — Telehealth: Payer: Self-pay | Admitting: *Deleted

## 2015-04-01 ENCOUNTER — Other Ambulatory Visit: Payer: Self-pay | Admitting: *Deleted

## 2015-04-01 DIAGNOSIS — B20 Human immunodeficiency virus [HIV] disease: Secondary | ICD-10-CM

## 2015-04-01 MED ORDER — EFAVIRENZ-EMTRICITAB-TENOFOVIR 600-200-300 MG PO TABS: 1.0000 | ORAL_TABLET | Freq: Every day | ORAL | Status: DC

## 2015-04-01 NOTE — Telephone Encounter (Signed)
Patient called to advise that since she [facility] the [facility] she has had a very painful headache to the point she could not work. She advised she has since switched back to her old medication Atripla and just wanted the doctor to know. Advised her will let him know.

## 2015-04-01 NOTE — Telephone Encounter (Signed)
Absolutely fine thanks

## 2015-04-14 ENCOUNTER — Other Ambulatory Visit: Payer: Self-pay | Admitting: *Deleted

## 2015-04-14 ENCOUNTER — Other Ambulatory Visit: Payer: Self-pay | Admitting: Infectious Diseases

## 2015-04-14 DIAGNOSIS — G44009 Cluster headache syndrome, unspecified, not intractable: Secondary | ICD-10-CM

## 2015-04-14 DIAGNOSIS — F419 Anxiety disorder, unspecified: Secondary | ICD-10-CM

## 2015-04-14 MED ORDER — ALPRAZOLAM 2 MG PO TABS: 2.0000 mg | ORAL_TABLET | Freq: Two times a day (BID) | ORAL | Status: DC

## 2015-04-14 MED ORDER — ACETAMINOPHEN-CODEINE #3 300-30 MG PO TABS: ORAL_TABLET | ORAL | Status: DC

## 2015-04-14 MED ORDER — IBUPROFEN 800 MG PO TABS: ORAL_TABLET | ORAL | Status: DC

## 2015-04-15 ENCOUNTER — Other Ambulatory Visit: Payer: Self-pay

## 2015-04-15 DIAGNOSIS — Z1231 Encounter for screening mammogram for malignant neoplasm of breast: Secondary | ICD-10-CM

## 2015-05-13 ENCOUNTER — Other Ambulatory Visit: Payer: Self-pay | Admitting: Infectious Diseases

## 2015-05-27 ENCOUNTER — Ambulatory Visit: Payer: Medicare Other

## 2015-06-17 ENCOUNTER — Other Ambulatory Visit: Payer: Self-pay | Admitting: Infectious Diseases

## 2015-06-17 DIAGNOSIS — R519 Headache, unspecified: Secondary | ICD-10-CM

## 2015-06-17 DIAGNOSIS — R51 Headache: Principal | ICD-10-CM

## 2015-07-07 ENCOUNTER — Other Ambulatory Visit: Payer: Medicare Other

## 2015-07-07 DIAGNOSIS — Z113 Encounter for screening for infections with a predominantly sexual mode of transmission: Secondary | ICD-10-CM | POA: Diagnosis not present

## 2015-07-07 DIAGNOSIS — B20 Human immunodeficiency virus [HIV] disease: Secondary | ICD-10-CM

## 2015-07-07 DIAGNOSIS — Z79899 Other long term (current) drug therapy: Secondary | ICD-10-CM

## 2015-07-07 LAB — CBC WITH DIFFERENTIAL/PLATELET
BASOS ABS: 0.1 10*3/uL (ref 0.0–0.1)
BASOS PCT: 1 % (ref 0–1)
EOS ABS: 0.1 10*3/uL (ref 0.0–0.7)
Eosinophils Relative: 2 % (ref 0–5)
HCT: 36.3 % (ref 36.0–46.0)
Hemoglobin: 12.3 g/dL (ref 12.0–15.0)
Lymphocytes Relative: 33 % (ref 12–46)
Lymphs Abs: 1.7 10*3/uL (ref 0.7–4.0)
MCH: 32.9 pg (ref 26.0–34.0)
MCHC: 33.9 g/dL (ref 30.0–36.0)
MCV: 97.1 fL (ref 78.0–100.0)
MPV: 10.7 fL (ref 8.6–12.4)
Monocytes Absolute: 0.2 10*3/uL (ref 0.1–1.0)
Monocytes Relative: 4 % (ref 3–12)
NEUTROS PCT: 60 % (ref 43–77)
Neutro Abs: 3.1 10*3/uL (ref 1.7–7.7)
PLATELETS: 216 10*3/uL (ref 150–400)
RBC: 3.74 MIL/uL — ABNORMAL LOW (ref 3.87–5.11)
RDW: 13.4 % (ref 11.5–15.5)
WBC: 5.2 10*3/uL (ref 4.0–10.5)

## 2015-07-07 LAB — COMPLETE METABOLIC PANEL WITH GFR
ALT: 17 U/L (ref 6–29)
AST: 16 U/L (ref 10–30)
Albumin: 4.2 g/dL (ref 3.6–5.1)
Alkaline Phosphatase: 64 U/L (ref 33–115)
BILIRUBIN TOTAL: 0.4 mg/dL (ref 0.2–1.2)
BUN: 11 mg/dL (ref 7–25)
CO2: 27 mmol/L (ref 20–31)
CREATININE: 0.77 mg/dL (ref 0.50–1.10)
Calcium: 8.8 mg/dL (ref 8.6–10.2)
Chloride: 105 mmol/L (ref 98–110)
GFR, Est African American: >89 mL/min (ref >=60)
GFR, Est Non African American: >89 mL/min (ref >=60)
GLUCOSE: 88 mg/dL (ref 65–99)
Potassium: 4.1 mmol/L (ref 3.5–5.3)
SODIUM: 142 mmol/L (ref 135–146)
TOTAL PROTEIN: 6 g/dL — AB (ref 6.1–8.1)

## 2015-07-07 LAB — LIPID PANEL
Cholesterol: 196 mg/dL (ref 125–200)
HDL: 38 mg/dL — AB (ref >=46)
LDL CALC: 127 mg/dL (ref <130)
Total CHOL/HDL Ratio: 5.2 Ratio — ABNORMAL HIGH (ref <=5.0)
Triglycerides: 156 mg/dL — ABNORMAL HIGH (ref <150)
VLDL: 31 mg/dL — ABNORMAL HIGH (ref <30)

## 2015-07-08 DIAGNOSIS — Z113 Encounter for screening for infections with a predominantly sexual mode of transmission: Secondary | ICD-10-CM | POA: Diagnosis not present

## 2015-07-08 DIAGNOSIS — Z79899 Other long term (current) drug therapy: Secondary | ICD-10-CM | POA: Diagnosis not present

## 2015-07-08 DIAGNOSIS — B20 Human immunodeficiency virus [HIV] disease: Secondary | ICD-10-CM | POA: Diagnosis not present

## 2015-07-08 LAB — RPR

## 2015-07-08 LAB — HIV-1 RNA QUANT-NO REFLEX-BLD
HIV 1 RNA Quant: <20 copies/mL (ref <20)
HIV-1 RNA Quant, Log: <1.3 Log copies/mL (ref <1.30)

## 2015-07-08 LAB — HEPATITIS B SURFACE ANTIBODY,QUALITATIVE: HEP B S AB: NEGATIVE

## 2015-07-09 LAB — T-HELPER CELL (CD4) - (RCID CLINIC ONLY)
CD4 T CELL ABS: 550 /uL (ref 400–2700)
CD4 T CELL HELPER: 32 % — AB (ref 33–55)

## 2015-07-14 ENCOUNTER — Other Ambulatory Visit: Payer: Self-pay | Admitting: Infectious Diseases

## 2015-07-20 ENCOUNTER — Other Ambulatory Visit: Payer: Self-pay | Admitting: Infectious Diseases

## 2015-07-21 ENCOUNTER — Encounter: Payer: Self-pay | Admitting: Infectious Diseases

## 2015-07-21 ENCOUNTER — Ambulatory Visit (INDEPENDENT_AMBULATORY_CARE_PROVIDER_SITE_OTHER): Payer: Medicare Other | Admitting: Infectious Diseases

## 2015-07-21 ENCOUNTER — Other Ambulatory Visit: Payer: Self-pay | Admitting: *Deleted

## 2015-07-21 VITALS — BP 132/86 | HR 64 | Temp 97.6°F | Wt 185.0 lb

## 2015-07-21 DIAGNOSIS — J321 Chronic frontal sinusitis: Secondary | ICD-10-CM

## 2015-07-21 DIAGNOSIS — Z79899 Other long term (current) drug therapy: Secondary | ICD-10-CM | POA: Diagnosis not present

## 2015-07-21 DIAGNOSIS — Z113 Encounter for screening for infections with a predominantly sexual mode of transmission: Secondary | ICD-10-CM | POA: Diagnosis not present

## 2015-07-21 DIAGNOSIS — M546 Pain in thoracic spine: Secondary | ICD-10-CM | POA: Diagnosis not present

## 2015-07-21 DIAGNOSIS — A630 Anogenital (venereal) warts: Secondary | ICD-10-CM

## 2015-07-21 DIAGNOSIS — B20 Human immunodeficiency virus [HIV] disease: Secondary | ICD-10-CM

## 2015-07-21 DIAGNOSIS — F17200 Nicotine dependence, unspecified, uncomplicated: Secondary | ICD-10-CM

## 2015-07-21 MED ORDER — AMOXICILLIN-POT CLAVULANATE 875-125 MG PO TABS: 1.0000 | ORAL_TABLET | Freq: Two times a day (BID) | ORAL | Status: DC

## 2015-07-21 MED ORDER — IMIQUIMOD 5 % EX CREA
TOPICAL_CREAM | CUTANEOUS | Status: DC
Start: 2015-07-21 — End: 2016-10-17

## 2015-07-21 MED ORDER — TRAMADOL HCL 50 MG PO TABS: 50.0000 mg | ORAL_TABLET | Freq: Two times a day (BID) | ORAL | Status: DC | PRN

## 2015-07-21 MED ORDER — ACETAMINOPHEN-CODEINE #3 300-30 MG PO TABS: ORAL_TABLET | ORAL | Status: DC

## 2015-07-21 MED ORDER — ALPRAZOLAM 2 MG PO TABS: 2.0000 mg | ORAL_TABLET | Freq: Two times a day (BID) | ORAL | Status: DC

## 2015-07-21 NOTE — Assessment & Plan Note (Signed)
Encouraged to quit. 

## 2015-07-21 NOTE — Assessment & Plan Note (Signed)
Will change her to imiquimod Will have her f/u with GYN

## 2015-07-21 NOTE — Assessment & Plan Note (Signed)
She is doing well Continue atripla Has gotten flu shot.  Given condoms (will give to her son) rtc in 6 months

## 2015-07-21 NOTE — Assessment & Plan Note (Signed)
Trial of ultram 

## 2015-07-21 NOTE — Progress Notes (Signed)
   Subjective:    Patient ID: Lori [name], female    DOB: Mar 07, 1971, 45 y.o.   MRN: [id number]  HPI  Eldest son is getting out of prison. Her brother shot him previously.   Review of Systems     Objective:   Physical Exam        Assessment & Plan:

## 2015-07-21 NOTE — Progress Notes (Signed)
   Subjective:    Patient ID: Lori [name], female    DOB: [birth year], 45 y.o.   MRN: [id number]  HPI 45 yo F with HIV+, anxiety d/o, anal/vaginal warts.  No problems with atripla.  Had PAP 09-2012 NL, 06-24-14 NL.  Had HRA 01-2013-has not been using podoxyphilin for vaginal warts. Insurance won't pay for. Wants alternative.   Getting refills of Tylenol 3, xanax, atripla.  Tried odefsy but couldn't tolerate.   HIV 1 RNA QUANT (copies/mL)  Date Value  07/07/2015 <20  03/03/2015 <20  05/13/2014 <20   CD4 T CELL ABS (/uL)  Date Value  07/07/2015 550  03/03/2015 570  05/13/2014 600   Has been feeling ok- having headaches, has sinus infection had nose bleed this AM.  Having back pain, work related (waitressing and cook). Taking tylenol 3. Would like something stronger.   Getting numbness on her R finger tips. Intermittent.  Smoking 1-2 ppd.   Review of Systems  Constitutional: Negative for appetite change and unexpected weight change.  Gastrointestinal: Negative for diarrhea and constipation.  Genitourinary: Positive for genital sores. Negative for difficulty urinating.  Musculoskeletal: Positive for myalgias and arthralgias.  Psychiatric/Behavioral: The patient is nervous/anxious.        Objective:   Physical Exam  Constitutional: She appears well-developed and well-nourished.  HENT:  Mouth/Throat: No oropharyngeal exudate.  Eyes: EOM are normal. Pupils are equal, round, and reactive to light.  Neck: Neck supple.  Cardiovascular: Normal rate, regular rhythm and normal heart sounds.   Pulmonary/Chest: Effort normal and breath sounds normal.  Abdominal: Soft. Bowel sounds are normal. There is no tenderness. There is no rebound.  Musculoskeletal: She exhibits no edema.  Lymphadenopathy:    She has no cervical adenopathy.       Assessment & Plan:

## 2015-07-21 NOTE — Assessment & Plan Note (Signed)
Would like refill of augmentin

## 2015-09-09 ENCOUNTER — Other Ambulatory Visit: Payer: Self-pay | Admitting: Infectious Diseases

## 2015-10-09 ENCOUNTER — Other Ambulatory Visit: Payer: Self-pay | Admitting: Infectious Diseases

## 2015-10-09 DIAGNOSIS — F4323 Adjustment disorder with mixed anxiety and depressed mood: Secondary | ICD-10-CM

## 2016-01-05 ENCOUNTER — Other Ambulatory Visit: Payer: Self-pay | Admitting: Infectious Diseases

## 2016-01-05 DIAGNOSIS — R519 Headache, unspecified: Secondary | ICD-10-CM

## 2016-01-05 DIAGNOSIS — R51 Headache: Principal | ICD-10-CM

## 2016-01-06 ENCOUNTER — Encounter: Payer: Self-pay | Admitting: *Deleted

## 2016-01-06 ENCOUNTER — Other Ambulatory Visit: Payer: Self-pay | Admitting: *Deleted

## 2016-01-06 DIAGNOSIS — R51 Headache: Principal | ICD-10-CM

## 2016-01-06 DIAGNOSIS — R519 Headache, unspecified: Secondary | ICD-10-CM

## 2016-01-06 MED ORDER — IBUPROFEN 800 MG PO TABS: 800.0000 mg | ORAL_TABLET | Freq: Three times a day (TID) | ORAL | 0 refills | Status: DC | PRN

## 2016-01-06 MED ORDER — ACETAMINOPHEN-CODEINE #3 300-30 MG PO TABS: ORAL_TABLET | ORAL | 0 refills | Status: DC

## 2016-02-02 ENCOUNTER — Other Ambulatory Visit: Payer: Self-pay | Admitting: Infectious Diseases

## 2016-02-02 DIAGNOSIS — F4323 Adjustment disorder with mixed anxiety and depressed mood: Secondary | ICD-10-CM

## 2016-02-03 ENCOUNTER — Telehealth: Payer: Self-pay | Admitting: *Deleted

## 2016-02-03 NOTE — Telephone Encounter (Signed)
Received incoming fax requesting refill of xanax 2 mg #60 and tylenol #3 300-30 mg #60.  Per chart review, Jennet Maduroenise Estridge RN sent these refill requests to Dr. Ninetta LightsHatcher for approval 8/21. Andree CossHowell, Sharlon Pfohl M, RN

## 2016-02-04 ENCOUNTER — Telehealth: Payer: Self-pay | Admitting: Infectious Diseases

## 2016-02-04 DIAGNOSIS — G43009 Migraine without aura, not intractable, without status migrainosus: Secondary | ICD-10-CM

## 2016-02-04 NOTE — Addendum Note (Signed)
Addended by: Jennet MaduroESTRIDGE, DENISE D on: 02/04/2016 02:09 PM   Modules accepted: Orders

## 2016-02-04 NOTE — Telephone Encounter (Addendum)
Notified pharmacy and the patient that per Dr. Ninetta LightsHatcher, the patient needs UDS prior to obtaining refills.  Left the patient a message to call for Lab Appointment

## 2016-02-04 NOTE — Telephone Encounter (Signed)
Pt's xanax and tylenol #3 refilled.  Lori [name] database reviewed, I am the only proscribor, always filled at same pharmacy.   She needs UDS at rx pickup

## 2016-02-05 NOTE — Telephone Encounter (Addendum)
Patient called this morning, wanted to apologize for getting upset with the nurse she "snapped at yesterday." Patient's questions answered about the reason for the testing prior to controlled substance prescriptions being written/called in/picked up.  She works a split shift Thurs/Fri, will come Monday for labs. Andree CossHowell, Praveen Coia M, RN

## 2016-02-09 ENCOUNTER — Other Ambulatory Visit: Payer: Self-pay | Admitting: Infectious Diseases

## 2016-02-09 ENCOUNTER — Other Ambulatory Visit: Payer: Self-pay | Admitting: *Deleted

## 2016-02-09 ENCOUNTER — Other Ambulatory Visit: Payer: Medicare Other

## 2016-02-09 DIAGNOSIS — G43009 Migraine without aura, not intractable, without status migrainosus: Secondary | ICD-10-CM

## 2016-02-09 DIAGNOSIS — Z113 Encounter for screening for infections with a predominantly sexual mode of transmission: Secondary | ICD-10-CM

## 2016-02-09 DIAGNOSIS — F4323 Adjustment disorder with mixed anxiety and depressed mood: Secondary | ICD-10-CM

## 2016-02-09 DIAGNOSIS — Z79899 Other long term (current) drug therapy: Secondary | ICD-10-CM

## 2016-02-09 DIAGNOSIS — B20 Human immunodeficiency virus [HIV] disease: Secondary | ICD-10-CM

## 2016-02-09 MED ORDER — ACETAMINOPHEN-CODEINE #3 300-30 MG PO TABS: ORAL_TABLET | ORAL | 1 refills | Status: DC

## 2016-02-09 MED ORDER — ALPRAZOLAM 2 MG PO TABS: 2.0000 mg | ORAL_TABLET | Freq: Two times a day (BID) | ORAL | 1 refills | Status: DC

## 2016-02-09 NOTE — Telephone Encounter (Signed)
02/09/16 - UDS obtained.  Pt's medications called to pharmacy by J. Jennye Moccasinockerham, CMA.

## 2016-02-18 LAB — PAIN MGMT, PROFILE 1 W/O CONF, U

## 2016-03-01 ENCOUNTER — Other Ambulatory Visit: Payer: Medicare Other

## 2016-03-01 ENCOUNTER — Other Ambulatory Visit (HOSPITAL_COMMUNITY)
Admission: RE | Admit: 2016-03-01 | Discharge: 2016-03-01 | Disposition: A | Discharge status: Home / Self Care | Payer: Medicare Other | Source: Ambulatory Visit | Attending: Infectious Diseases | Admitting: Infectious Diseases

## 2016-03-01 DIAGNOSIS — Z113 Encounter for screening for infections with a predominantly sexual mode of transmission: Secondary | ICD-10-CM | POA: Insufficient documentation

## 2016-03-01 DIAGNOSIS — B20 Human immunodeficiency virus [HIV] disease: Secondary | ICD-10-CM

## 2016-03-01 DIAGNOSIS — Z79899 Other long term (current) drug therapy: Secondary | ICD-10-CM

## 2016-03-01 LAB — COMPREHENSIVE METABOLIC PANEL
ALT: 12 U/L (ref 6–29)
AST: 15 U/L (ref 10–35)
Albumin: 4.5 g/dL (ref 3.6–5.1)
Alkaline Phosphatase: 54 U/L (ref 33–115)
BUN: 15 mg/dL (ref 7–25)
CALCIUM: 9 mg/dL (ref 8.6–10.2)
CO2: 25 mmol/L (ref 20–31)
Chloride: 106 mmol/L (ref 98–110)
Creat: 0.77 mg/dL (ref 0.50–1.10)
GLUCOSE: 88 mg/dL (ref 65–99)
Potassium: 3.9 mmol/L (ref 3.5–5.3)
SODIUM: 140 mmol/L (ref 135–146)
Total Bilirubin: 0.3 mg/dL (ref 0.2–1.2)
Total Protein: 6.3 g/dL (ref 6.1–8.1)

## 2016-03-01 LAB — LIPID PANEL
CHOL/HDL RATIO: 4.5 ratio (ref <=5.0)
Cholesterol: 208 mg/dL — ABNORMAL HIGH (ref 125–200)
HDL: 46 mg/dL (ref >=46)
LDL CALC: 134 mg/dL — AB (ref <130)
Triglycerides: 138 mg/dL (ref <150)
VLDL: 28 mg/dL (ref <30)

## 2016-03-01 LAB — CBC
HEMATOCRIT: 36.4 % (ref 35.0–45.0)
Hemoglobin: 12.2 g/dL (ref 11.7–15.5)
MCH: 33.4 pg — ABNORMAL HIGH (ref 27.0–33.0)
MCHC: 33.5 g/dL (ref 32.0–36.0)
MCV: 99.7 fL (ref 80.0–100.0)
MPV: 10.3 fL (ref 7.5–12.5)
PLATELETS: 196 10*3/uL (ref 140–400)
RBC: 3.65 MIL/uL — ABNORMAL LOW (ref 3.80–5.10)
RDW: 13.6 % (ref 11.0–15.0)
WBC: 5.1 10*3/uL (ref 3.8–10.8)

## 2016-03-02 LAB — T-HELPER CELL (CD4) - (RCID CLINIC ONLY)
CD4 % Helper T Cell: 30 % — ABNORMAL LOW (ref 33–55)
CD4 T CELL ABS: 530 /uL (ref 400–2700)

## 2016-03-02 LAB — URINE CYTOLOGY ANCILLARY ONLY
CHLAMYDIA, DNA PROBE: NEGATIVE
Neisseria Gonorrhea: NEGATIVE

## 2016-03-02 LAB — HIV-1 RNA QUANT-NO REFLEX-BLD
HIV 1 RNA Quant: <20 copies/mL (ref <20)
HIV-1 RNA Quant, Log: <1.3 Log copies/mL (ref <1.30)

## 2016-03-02 LAB — RPR

## 2016-03-08 ENCOUNTER — Other Ambulatory Visit: Payer: Self-pay | Admitting: Infectious Diseases

## 2016-03-08 DIAGNOSIS — R51 Headache: Principal | ICD-10-CM

## 2016-03-08 DIAGNOSIS — R519 Headache, unspecified: Secondary | ICD-10-CM

## 2016-03-15 ENCOUNTER — Ambulatory Visit: Payer: Medicare Other | Admitting: Infectious Diseases

## 2016-03-15 ENCOUNTER — Ambulatory Visit: Payer: Medicare Other

## 2016-04-05 ENCOUNTER — Other Ambulatory Visit: Payer: Self-pay | Admitting: Infectious Diseases

## 2016-04-05 DIAGNOSIS — B20 Human immunodeficiency virus [HIV] disease: Secondary | ICD-10-CM

## 2016-04-05 DIAGNOSIS — F4323 Adjustment disorder with mixed anxiety and depressed mood: Secondary | ICD-10-CM

## 2016-04-07 ENCOUNTER — Other Ambulatory Visit: Payer: Self-pay | Admitting: *Deleted

## 2016-04-07 ENCOUNTER — Other Ambulatory Visit: Payer: Self-pay | Admitting: Infectious Diseases

## 2016-04-07 DIAGNOSIS — F4323 Adjustment disorder with mixed anxiety and depressed mood: Secondary | ICD-10-CM

## 2016-04-07 DIAGNOSIS — B20 Human immunodeficiency virus [HIV] disease: Secondary | ICD-10-CM

## 2016-04-07 MED ORDER — EFAVIRENZ-EMTRICITAB-TENOFOVIR 600-200-300 MG PO TABS: 1.0000 | ORAL_TABLET | Freq: Every day | ORAL | 3 refills | Status: DC

## 2016-04-07 NOTE — Telephone Encounter (Signed)
Refill Atripla RX.

## 2016-04-08 ENCOUNTER — Other Ambulatory Visit: Payer: Self-pay | Admitting: *Deleted

## 2016-04-08 DIAGNOSIS — G8929 Other chronic pain: Secondary | ICD-10-CM

## 2016-04-08 DIAGNOSIS — M546 Pain in thoracic spine: Principal | ICD-10-CM

## 2016-04-08 DIAGNOSIS — F4323 Adjustment disorder with mixed anxiety and depressed mood: Secondary | ICD-10-CM

## 2016-04-08 MED ORDER — ACETAMINOPHEN-CODEINE #3 300-30 MG PO TABS: ORAL_TABLET | ORAL | 0 refills | Status: DC

## 2016-04-08 MED ORDER — ALPRAZOLAM 2 MG PO TABS: 2.0000 mg | ORAL_TABLET | Freq: Two times a day (BID) | ORAL | 0 refills | Status: DC

## 2016-05-03 ENCOUNTER — Ambulatory Visit (INDEPENDENT_AMBULATORY_CARE_PROVIDER_SITE_OTHER): Payer: Medicare Other | Admitting: Infectious Diseases

## 2016-05-03 ENCOUNTER — Ambulatory Visit: Payer: Medicare Other | Admitting: *Deleted

## 2016-05-03 ENCOUNTER — Encounter: Payer: Self-pay | Admitting: Infectious Diseases

## 2016-05-03 VITALS — Temp 98.7°F | Ht 65.0 in | Wt 197.0 lb

## 2016-05-03 DIAGNOSIS — R4589 Other symptoms and signs involving emotional state: Secondary | ICD-10-CM

## 2016-05-03 DIAGNOSIS — G8929 Other chronic pain: Secondary | ICD-10-CM | POA: Diagnosis not present

## 2016-05-03 DIAGNOSIS — Z79899 Other long term (current) drug therapy: Secondary | ICD-10-CM | POA: Diagnosis not present

## 2016-05-03 DIAGNOSIS — F4323 Adjustment disorder with mixed anxiety and depressed mood: Secondary | ICD-10-CM | POA: Diagnosis not present

## 2016-05-03 DIAGNOSIS — Z23 Encounter for immunization: Secondary | ICD-10-CM

## 2016-05-03 DIAGNOSIS — M546 Pain in thoracic spine: Secondary | ICD-10-CM

## 2016-05-03 DIAGNOSIS — B20 Human immunodeficiency virus [HIV] disease: Secondary | ICD-10-CM

## 2016-05-03 DIAGNOSIS — R410 Disorientation, unspecified: Secondary | ICD-10-CM | POA: Insufficient documentation

## 2016-05-03 DIAGNOSIS — F05 Delirium due to known physiological condition: Secondary | ICD-10-CM | POA: Diagnosis not present

## 2016-05-03 DIAGNOSIS — Z113 Encounter for screening for infections with a predominantly sexual mode of transmission: Secondary | ICD-10-CM

## 2016-05-03 DIAGNOSIS — G43C0 Periodic headache syndromes in child or adult, not intractable: Secondary | ICD-10-CM

## 2016-05-03 MED ORDER — ALPRAZOLAM 2 MG PO TABS: 2.0000 mg | ORAL_TABLET | Freq: Two times a day (BID) | ORAL | 0 refills | Status: DC

## 2016-05-03 MED ORDER — ACETAMINOPHEN-CODEINE #3 300-30 MG PO TABS
ORAL_TABLET | ORAL | 0 refills | Status: DC
Start: 2016-05-03 — End: 2016-05-04

## 2016-05-03 MED ORDER — ELVITEG-COBIC-EMTRICIT-TENOFAF 150-150-200-10 MG PO TABS: 1.0000 | ORAL_TABLET | Freq: Every day | ORAL | 3 refills | Status: DC

## 2016-05-03 NOTE — Progress Notes (Signed)
Counselor met with Lori [name] today for a warm hand off.  Patient was oriented times four with good affect and dress.  Patient was alert and talkative.  Patient shared that she is dealing with a lot going on in her life and feels very distracted. Patient said that she has felt some changes going on with her and is not sure if it is related to the medications she is taking or what.  Patient did communicated that she needs to address some issues that warrant therapy but is not suicidal.  Counselor provided support and encouragement accordingly. Counselor explained to patient that getting counseling and processing what's going on in your life can prevent things reaching to feeling suicidal. Patient stated that she understood and would make an appointment.    , MA, [facility] 

## 2016-05-03 NOTE — Progress Notes (Signed)
   Subjective:    Patient ID: Lori [name], female    DOB: 08/19/1970, 45 y.o.   MRN: [id number]  HPI 45 yo F with HIV+, anxiety d/o, anal/vaginal warts.  No problems with atripla.  Had PAP 09-2012 NL, 06-24-14 NL.  Had HRA 01-2013-has not been using podoxyphilin for vaginal warts. Changed to imiqoud.  Getting refills of Tylenol 3, xanax, atripla.  Tried odefsy but couldn't tolerate.   Today feels like she has difficulty with memory. Takes occas tylenol pm. Takes xanax as written (bid). Confusion no different if smokes marijuana, drinks ETOH or not.  Feels "fumbly" mentally.  Also has concerns about coughing a lot and throat hurting a lot. Still smoking, 2ppd.  Has been having GI upset from eating "junk food".  Has new boyfriend. Does not feel like he has been that stressful on her.  Has been able work and drive without difficulty. Feels like she gets lost easier which causes her to get stressed and get confused.   Needs PAP, mammo.  HIV 1 RNA Quant (copies/mL)  Date Value  03/01/2016 <20  07/07/2015 <20  03/03/2015 <20   CD4 T Cell Abs (/uL)  Date Value  03/01/2016 530  07/07/2015 550  03/03/2015 570    Review of Systems  Constitutional: Negative for chills, fever and unexpected weight change.  Gastrointestinal: Negative for abdominal pain, constipation and diarrhea.  Genitourinary: Negative for difficulty urinating, pelvic pain and vaginal discharge.  Musculoskeletal: Positive for arthralgias.  Neurological: Positive for headaches.  Psychiatric/Behavioral: Positive for confusion.  has low grade, non-migraine, stress related headache every day.      Objective:   Physical Exam  Constitutional: She is oriented to person, place, and time. She appears well-developed and well-nourished.  HENT:  Mouth/Throat: No oropharyngeal exudate.  Eyes: EOM are normal. Pupils are equal, round, and reactive to light.  Neck: Neck supple.  Cardiovascular: Normal rate, regular  rhythm and normal heart sounds.   Pulmonary/Chest: Effort normal and breath sounds normal.  Abdominal: Soft. Bowel sounds are normal. There is no tenderness. There is no rebound.  Musculoskeletal: She exhibits no edema.  Lymphadenopathy:    She has no cervical adenopathy.  Neurological: She is alert and oriented to person, place, and time. No cranial nerve deficit. She exhibits normal muscle tone. Coordination normal.  Psychiatric: She has a normal mood and affect. Her behavior is normal.      Assessment & Plan:

## 2016-05-03 NOTE — Assessment & Plan Note (Addendum)
Etiology f this is unclear.  She has a psych hx, is on chronic benzo and tylenol 3.  As well, is on atripla which can cause cns issues.  She is being eval by [facility] memory study which she defers.  Will have her seen by neuro if does not improve with genvoya <-- atripla.

## 2016-05-03 NOTE — Assessment & Plan Note (Addendum)
She is doing well Being eval by [facility] for memory study. She does not wish to do this. I will change her to genvoya.  Flu and mening vax today.  rtc in 3 months.

## 2016-05-03 NOTE — Assessment & Plan Note (Addendum)
tyelon 3 prn Indication for chronic opioid: migraines Medication and dose: tylenol #3 1-2 q4h prn # pills per month: 60 Last UDS date: ordered 11-201-17 Pain contract signed (Y/N): Y Date narcotic database last reviewed (include red flags): 05-03-16, none

## 2016-05-03 NOTE — Addendum Note (Signed)
Addended by: Andree CossHOWELL, Joanna Hall M on: 05/03/2016 05:19 PM   Modules accepted: Orders

## 2016-05-04 ENCOUNTER — Other Ambulatory Visit: Payer: Self-pay | Admitting: Infectious Diseases

## 2016-05-04 DIAGNOSIS — M546 Pain in thoracic spine: Secondary | ICD-10-CM

## 2016-05-04 DIAGNOSIS — B20 Human immunodeficiency virus [HIV] disease: Secondary | ICD-10-CM | POA: Diagnosis not present

## 2016-05-04 DIAGNOSIS — G8929 Other chronic pain: Secondary | ICD-10-CM | POA: Diagnosis not present

## 2016-05-04 DIAGNOSIS — F4323 Adjustment disorder with mixed anxiety and depressed mood: Secondary | ICD-10-CM

## 2016-05-09 LAB — PAIN MGMT, PROFILE 1 W/O CONF, U
AMPHETAMINES: NEGATIVE ng/mL (ref <500)
BARBITURATES: NEGATIVE ng/mL (ref <300)
Benzodiazepines: POSITIVE ng/mL — AB (ref <100)
Cocaine Metabolite: NEGATIVE ng/mL (ref <150)
Creatinine: 129.8 mg/dL (ref >=20.0)
Marijuana Metabolite: POSITIVE ng/mL — AB (ref <20)
Methadone Metabolite: NEGATIVE ng/mL (ref <100)
Opiates: NEGATIVE ng/mL (ref <100)
Oxidant: NEGATIVE ug/mL (ref <200)
Oxycodone: NEGATIVE ng/mL (ref <100)
PH: 6.76 (ref 4.5–9.0)
Phencyclidine: NEGATIVE ng/mL (ref <25)

## 2016-05-31 ENCOUNTER — Other Ambulatory Visit: Payer: Self-pay | Admitting: Infectious Diseases

## 2016-05-31 DIAGNOSIS — G8929 Other chronic pain: Secondary | ICD-10-CM

## 2016-05-31 DIAGNOSIS — M546 Pain in thoracic spine: Principal | ICD-10-CM

## 2016-05-31 DIAGNOSIS — F4323 Adjustment disorder with mixed anxiety and depressed mood: Secondary | ICD-10-CM

## 2016-06-22 ENCOUNTER — Other Ambulatory Visit: Payer: Self-pay | Admitting: Infectious Diseases

## 2016-06-22 DIAGNOSIS — R519 Headache, unspecified: Secondary | ICD-10-CM

## 2016-06-22 DIAGNOSIS — R51 Headache: Principal | ICD-10-CM

## 2016-07-20 ENCOUNTER — Other Ambulatory Visit: Payer: Medicare Other

## 2016-07-20 ENCOUNTER — Other Ambulatory Visit (HOSPITAL_COMMUNITY)
Admission: RE | Admit: 2016-07-20 | Discharge: 2016-07-20 | Disposition: A | Discharge status: Home / Self Care | Payer: Medicare Other | Source: Ambulatory Visit | Attending: Infectious Diseases | Admitting: Infectious Diseases

## 2016-07-20 DIAGNOSIS — Z79899 Other long term (current) drug therapy: Secondary | ICD-10-CM

## 2016-07-20 DIAGNOSIS — B20 Human immunodeficiency virus [HIV] disease: Secondary | ICD-10-CM

## 2016-07-20 DIAGNOSIS — Z113 Encounter for screening for infections with a predominantly sexual mode of transmission: Secondary | ICD-10-CM | POA: Diagnosis not present

## 2016-07-20 LAB — COMPLETE METABOLIC PANEL WITH GFR
ALBUMIN: 4.3 g/dL (ref 3.6–5.1)
ALK PHOS: 50 U/L (ref 33–115)
ALT: 11 U/L (ref 6–29)
AST: 16 U/L (ref 10–35)
BUN: 16 mg/dL (ref 7–25)
CO2: 30 mmol/L (ref 20–31)
CREATININE: 0.98 mg/dL (ref 0.50–1.10)
Calcium: 8.8 mg/dL (ref 8.6–10.2)
Chloride: 107 mmol/L (ref 98–110)
GFR, Est African American: 81 mL/min (ref >=60)
GFR, Est Non African American: 70 mL/min (ref >=60)
Glucose, Bld: 74 mg/dL (ref 65–99)
POTASSIUM: 4 mmol/L (ref 3.5–5.3)
Sodium: 142 mmol/L (ref 135–146)
Total Bilirubin: 0.3 mg/dL (ref 0.2–1.2)
Total Protein: 6.3 g/dL (ref 6.1–8.1)

## 2016-07-20 LAB — LIPID PANEL
CHOL/HDL RATIO: 4.7 ratio (ref <5.0)
CHOLESTEROL: 213 mg/dL — AB (ref <200)
HDL: 45 mg/dL — ABNORMAL LOW (ref >50)
LDL Cholesterol: 143 mg/dL — ABNORMAL HIGH (ref <100)
TRIGLYCERIDES: 126 mg/dL (ref <150)
VLDL: 25 mg/dL (ref <30)

## 2016-07-20 LAB — CBC
HCT: 35.6 % (ref 35.0–45.0)
Hemoglobin: 11.6 g/dL — ABNORMAL LOW (ref 11.7–15.5)
MCH: 32.5 pg (ref 27.0–33.0)
MCHC: 32.6 g/dL (ref 32.0–36.0)
MCV: 99.7 fL (ref 80.0–100.0)
MPV: 10.6 fL (ref 7.5–12.5)
PLATELETS: 222 10*3/uL (ref 140–400)
RBC: 3.57 MIL/uL — AB (ref 3.80–5.10)
RDW: 13.6 % (ref 11.0–15.0)
WBC: 4.9 10*3/uL (ref 3.8–10.8)

## 2016-07-21 LAB — URINE CYTOLOGY ANCILLARY ONLY
CHLAMYDIA, DNA PROBE: NEGATIVE
NEISSERIA GONORRHEA: NEGATIVE

## 2016-07-21 LAB — RPR

## 2016-07-21 LAB — T-HELPER CELL (CD4) - (RCID CLINIC ONLY)
CD4 T CELL ABS: 670 /uL (ref 400–2700)
CD4 T CELL HELPER: 32 % — AB (ref 33–55)

## 2016-07-23 LAB — HIV-1 RNA QUANT-NO REFLEX-BLD
HIV 1 RNA QUANT: NOT DETECTED {copies}/mL
HIV-1 RNA Quant, Log: <1.3 Log copies/mL

## 2016-08-02 ENCOUNTER — Ambulatory Visit: Payer: Medicare Other | Admitting: Infectious Diseases

## 2016-08-02 ENCOUNTER — Other Ambulatory Visit: Payer: Self-pay | Admitting: Infectious Diseases

## 2016-08-02 DIAGNOSIS — F4323 Adjustment disorder with mixed anxiety and depressed mood: Secondary | ICD-10-CM

## 2016-08-02 DIAGNOSIS — G8929 Other chronic pain: Secondary | ICD-10-CM

## 2016-08-02 DIAGNOSIS — R519 Headache, unspecified: Secondary | ICD-10-CM

## 2016-08-02 DIAGNOSIS — R51 Headache: Principal | ICD-10-CM

## 2016-08-02 DIAGNOSIS — M546 Pain in thoracic spine: Secondary | ICD-10-CM

## 2016-08-09 ENCOUNTER — Encounter: Payer: Self-pay | Admitting: Infectious Diseases

## 2016-08-09 ENCOUNTER — Other Ambulatory Visit: Payer: Self-pay | Admitting: Infectious Diseases

## 2016-08-09 ENCOUNTER — Ambulatory Visit (INDEPENDENT_AMBULATORY_CARE_PROVIDER_SITE_OTHER): Payer: Medicare Other | Admitting: Infectious Diseases

## 2016-08-09 VITALS — BP 120/83 | HR 73 | Temp 98.2°F | Ht 64.0 in | Wt 198.0 lb

## 2016-08-09 DIAGNOSIS — B20 Human immunodeficiency virus [HIV] disease: Secondary | ICD-10-CM | POA: Diagnosis not present

## 2016-08-09 DIAGNOSIS — G43C0 Periodic headache syndromes in child or adult, not intractable: Secondary | ICD-10-CM | POA: Diagnosis not present

## 2016-08-09 DIAGNOSIS — Z0289 Encounter for other administrative examinations: Secondary | ICD-10-CM

## 2016-08-09 DIAGNOSIS — M546 Pain in thoracic spine: Secondary | ICD-10-CM | POA: Diagnosis not present

## 2016-08-09 DIAGNOSIS — G8929 Other chronic pain: Secondary | ICD-10-CM | POA: Diagnosis not present

## 2016-08-09 DIAGNOSIS — F17200 Nicotine dependence, unspecified, uncomplicated: Secondary | ICD-10-CM

## 2016-08-09 DIAGNOSIS — R51 Headache: Secondary | ICD-10-CM

## 2016-08-09 DIAGNOSIS — R519 Headache, unspecified: Secondary | ICD-10-CM

## 2016-08-09 DIAGNOSIS — F4323 Adjustment disorder with mixed anxiety and depressed mood: Secondary | ICD-10-CM

## 2016-08-09 MED ORDER — ELVITEG-COBIC-EMTRICIT-TENOFAF 150-150-200-10 MG PO TABS: 1.0000 | ORAL_TABLET | Freq: Every day | ORAL | 3 refills | Status: DC

## 2016-08-09 MED ORDER — NICOTINE 21 MG/24HR TD PT24: 21.0000 mg | MEDICATED_PATCH | Freq: Every day | TRANSDERMAL | 0 refills | Status: AC

## 2016-08-09 MED ORDER — IBUPROFEN 800 MG PO TABS: ORAL_TABLET | ORAL | 2 refills | Status: DC

## 2016-08-09 MED ORDER — ALPRAZOLAM 2 MG PO TABS: 2.0000 mg | ORAL_TABLET | Freq: Two times a day (BID) | ORAL | 0 refills | Status: DC

## 2016-08-09 MED ORDER — NICOTINE 14 MG/24HR TD PT24: 14.0000 mg | MEDICATED_PATCH | Freq: Every day | TRANSDERMAL | 0 refills | Status: AC

## 2016-08-09 MED ORDER — ACETAMINOPHEN-CODEINE #3 300-30 MG PO TABS: ORAL_TABLET | ORAL | 0 refills | Status: DC

## 2016-08-09 MED ORDER — NICOTINE 7 MG/24HR TD PT24: 7.0000 mg | MEDICATED_PATCH | Freq: Every day | TRANSDERMAL | 0 refills | Status: AC

## 2016-08-09 NOTE — Assessment & Plan Note (Signed)
She is doing well.  Offered/refused condoms.  Continue genvoya.  She will arrange f/u appt at [facility] for PAP, mammo.

## 2016-08-09 NOTE — Assessment & Plan Note (Addendum)
She continues on prn ibuprofen, tylenol #3.  Better since off atripla.  Indication for chronic opioid: migraines, chronic musculoskeletal pain, neuropathy Medication and dose: tylenol #3 1-2 q4h prn pain # pills per month: 60 Last UDS date: today Pain contract signed (Y/N): Y Date narcotic database last reviewed (include red flags):  08-09-16, no red flags

## 2016-08-09 NOTE — Addendum Note (Signed)
Addended by: Lurlean LeydenPOOLE, TRAVIS F on: 08/09/2016 02:53 PM   Modules accepted: Orders

## 2016-08-09 NOTE — Progress Notes (Signed)
   Subjective:    Patient ID: Lori BoozeCristina J [name], female    DOB: 01/22/1971, 46 y.o.   MRN: [id number]  HPI 46 yo F with HIV+, anxiety d/o, anal/vaginal warts.  No problems with atripla.  Had PAP 09-2012 NL, 06-24-14 NL.  Previous hysterectomy.  Had HRA 01-2013-has not been using podoxyphilin for vaginal warts. Changed to imiqoud.  Getting refills of Tylenol 3, xanax  Tried odefsy but couldn't tolerate.   Since changing atripal to genvoya- she has had better concentration. Her headaches have improved. She has had trouble falling asleep (drinking a lot of caffiene). Depression is better. Less tearful.  Has had trouble with pain in her L foot, still works waiting tables. Pain is better over 2 weeks. Prev broke R 5th metatarsal ([phone]).  Having some numbness. Not sure if she needs new shoes. Wants to be seen by "somebody who does foot stuff".   Wants nicotine patches, coughing up "black crap".   HIV 1 RNA Quant (copies/mL)  Date Value  07/20/2016 <20 NOT DETECTED  03/01/2016 <20  07/07/2015 <20   CD4 T Cell Abs (/uL)  Date Value  07/20/2016 670  03/01/2016 530  07/07/2015 550    Review of Systems  Constitutional: Negative for appetite change and unexpected weight change.  Respiratory: Positive for cough.   Gastrointestinal: Negative for constipation and diarrhea.  Genitourinary: Negative for difficulty urinating.  Psychiatric/Behavioral: Negative for confusion and dysphoric mood.  Please see HPI. 12 point ROS o/w (-)     Objective:   Physical Exam  Constitutional: She appears well-developed and well-nourished.  HENT:  Mouth/Throat: No oropharyngeal exudate.  Eyes: EOM are normal. Pupils are equal, round, and reactive to light.  Neck: Neck supple.  Cardiovascular: Normal rate, regular rhythm and normal heart sounds.   Pulmonary/Chest: Effort normal and breath sounds normal.  Abdominal: Soft. Bowel sounds are normal. There is no tenderness. There is no rebound.    Musculoskeletal: She exhibits no edema.  Lymphadenopathy:    She has no cervical adenopathy.       Assessment & Plan:

## 2016-08-09 NOTE — Addendum Note (Signed)
Addended by: Chanler Schreiter C on: 08/09/2016 02:28 PM   Modules accepted: Orders

## 2016-08-09 NOTE — Addendum Note (Signed)
Addended by: HATCHER, JEFFREY C on: 08/09/2016 02:34 PM   Modules accepted: Orders

## 2016-08-09 NOTE — Assessment & Plan Note (Signed)
Will write her nicotine patch.  2 ppd currently.

## 2016-08-12 LAB — PAIN MGMT, PROFILE 1 W/O CONF, U
AMPHETAMINES: NEGATIVE ng/mL (ref <500)
Barbiturates: NEGATIVE ng/mL (ref <300)
Benzodiazepines: POSITIVE ng/mL — AB (ref <100)
Cocaine Metabolite: POSITIVE ng/mL — AB (ref <150)
Creatinine: 113 mg/dL (ref >=20.0)
METHADONE METABOLITE: NEGATIVE ng/mL (ref <100)
Marijuana Metabolite: POSITIVE ng/mL — AB (ref <20)
OXYCODONE: NEGATIVE ng/mL (ref <100)
Opiates: NEGATIVE ng/mL (ref <100)
Oxidant: NEGATIVE ug/mL (ref <200)
PH: 6.95 (ref 4.5–9.0)
PHENCYCLIDINE: NEGATIVE ng/mL (ref <25)

## 2016-08-13 ENCOUNTER — Telehealth: Payer: Self-pay | Admitting: Infectious Diseases

## 2016-08-13 NOTE — Telephone Encounter (Signed)
Pt with multiple + on her urine tox.  Pain contract violated.

## 2016-08-17 NOTE — Progress Notes (Signed)
Dr Ninetta LightsHatcher will be calling patient.

## 2016-08-18 ENCOUNTER — Telehealth: Payer: Self-pay | Admitting: Infectious Diseases

## 2016-08-18 DIAGNOSIS — F1190 Opioid use, unspecified, uncomplicated: Secondary | ICD-10-CM

## 2016-08-18 NOTE — Telephone Encounter (Signed)
Called pt regarding her drug screen test.  She denies any recent usage of cocaine. She suggests that this could be from a "laced" joint that she may have had.    I have asked her to have a clean UDS prior to any further pain rxs.

## 2016-08-30 ENCOUNTER — Ambulatory Visit: Payer: Medicare Other | Admitting: Infectious Diseases

## 2016-09-06 ENCOUNTER — Other Ambulatory Visit: Payer: Medicare Other

## 2016-09-06 DIAGNOSIS — G8929 Other chronic pain: Secondary | ICD-10-CM

## 2016-09-07 LAB — PAIN MGMT, PROFILE 1 W/O CONF, U
Amphetamines: NEGATIVE ng/mL (ref <500)
BARBITURATES: NEGATIVE ng/mL (ref <300)
BENZODIAZEPINES: POSITIVE ng/mL — AB (ref <100)
Cocaine Metabolite: NEGATIVE ng/mL (ref <150)
Creatinine: 147.6 mg/dL (ref >=20.0)
MARIJUANA METABOLITE: POSITIVE ng/mL — AB (ref <20)
Methadone Metabolite: NEGATIVE ng/mL (ref <100)
OXIDANT: NEGATIVE ug/mL (ref <200)
Opiates: POSITIVE ng/mL — AB (ref <100)
Oxycodone: POSITIVE ng/mL — AB (ref <100)
Phencyclidine: NEGATIVE ng/mL (ref <25)
pH: 6.9 (ref 4.5–9.0)

## 2016-09-08 ENCOUNTER — Telehealth: Payer: Self-pay | Admitting: *Deleted

## 2016-09-08 ENCOUNTER — Other Ambulatory Visit: Payer: Self-pay | Admitting: *Deleted

## 2016-09-08 DIAGNOSIS — M546 Pain in thoracic spine: Principal | ICD-10-CM

## 2016-09-08 DIAGNOSIS — G8929 Other chronic pain: Secondary | ICD-10-CM

## 2016-09-08 DIAGNOSIS — F4323 Adjustment disorder with mixed anxiety and depressed mood: Secondary | ICD-10-CM

## 2016-09-08 MED ORDER — ACETAMINOPHEN-CODEINE #3 300-30 MG PO TABS: ORAL_TABLET | ORAL | 0 refills | Status: DC

## 2016-09-08 MED ORDER — ALPRAZOLAM 2 MG PO TABS: 2.0000 mg | ORAL_TABLET | Freq: Two times a day (BID) | ORAL | 0 refills | Status: DC

## 2016-09-08 NOTE — Telephone Encounter (Signed)
Patient called to check the results of her UDS and see if she can have her Rx called in. Advised will have the doctor look at it and give her a call back.

## 2016-09-08 NOTE — Telephone Encounter (Signed)
Ok to refill   thanks

## 2016-09-09 DIAGNOSIS — Z0289 Encounter for other administrative examinations: Secondary | ICD-10-CM | POA: Insufficient documentation

## 2016-10-04 ENCOUNTER — Other Ambulatory Visit: Payer: Self-pay | Admitting: Infectious Diseases

## 2016-10-04 DIAGNOSIS — G8929 Other chronic pain: Secondary | ICD-10-CM

## 2016-10-04 DIAGNOSIS — F4323 Adjustment disorder with mixed anxiety and depressed mood: Secondary | ICD-10-CM

## 2016-10-04 DIAGNOSIS — M546 Pain in thoracic spine: Secondary | ICD-10-CM

## 2016-10-04 NOTE — Telephone Encounter (Signed)
Patient requesting refill of controlled substances. Please advise if ok to fill per her last urine drug screen.

## 2016-10-05 MED ORDER — ALPRAZOLAM 2 MG PO TABS: 2.0000 mg | ORAL_TABLET | Freq: Two times a day (BID) | ORAL | 0 refills | Status: DC

## 2016-10-05 MED ORDER — ACETAMINOPHEN-CODEINE #3 300-30 MG PO TABS: 1.0000 | ORAL_TABLET | ORAL | 0 refills | Status: DC | PRN

## 2016-10-05 NOTE — Addendum Note (Signed)
Addended by: Andree Coss on: 10/05/2016 10:01 AM   Modules accepted: Orders

## 2016-10-17 ENCOUNTER — Encounter (HOSPITAL_COMMUNITY): Payer: Self-pay

## 2016-10-17 ENCOUNTER — Emergency Department (HOSPITAL_COMMUNITY): Payer: Medicare Other

## 2016-10-17 ENCOUNTER — Emergency Department (HOSPITAL_COMMUNITY)
Admission: EM | Admit: 2016-10-17 | Discharge: 2016-10-17 | Disposition: A | Discharge status: Home / Self Care | Payer: Medicare Other | Attending: Emergency Medicine | Admitting: Emergency Medicine

## 2016-10-17 DIAGNOSIS — F17210 Nicotine dependence, cigarettes, uncomplicated: Secondary | ICD-10-CM | POA: Diagnosis not present

## 2016-10-17 DIAGNOSIS — R0682 Tachypnea, not elsewhere classified: Secondary | ICD-10-CM | POA: Diagnosis not present

## 2016-10-17 DIAGNOSIS — F1410 Cocaine abuse, uncomplicated: Secondary | ICD-10-CM | POA: Diagnosis not present

## 2016-10-17 DIAGNOSIS — I10 Essential (primary) hypertension: Secondary | ICD-10-CM | POA: Diagnosis not present

## 2016-10-17 DIAGNOSIS — E860 Dehydration: Secondary | ICD-10-CM | POA: Insufficient documentation

## 2016-10-17 DIAGNOSIS — Z21 Asymptomatic human immunodeficiency virus [HIV] infection status: Secondary | ICD-10-CM | POA: Diagnosis not present

## 2016-10-17 DIAGNOSIS — E876 Hypokalemia: Secondary | ICD-10-CM | POA: Insufficient documentation

## 2016-10-17 DIAGNOSIS — M545 Low back pain, unspecified: Secondary | ICD-10-CM

## 2016-10-17 DIAGNOSIS — R339 Retention of urine, unspecified: Secondary | ICD-10-CM | POA: Diagnosis present

## 2016-10-17 DIAGNOSIS — N289 Disorder of kidney and ureter, unspecified: Secondary | ICD-10-CM | POA: Diagnosis not present

## 2016-10-17 LAB — URINALYSIS, ROUTINE W REFLEX MICROSCOPIC
BACTERIA UA: NONE SEEN
Bilirubin Urine: NEGATIVE
GLUCOSE, UA: NEGATIVE mg/dL
Ketones, ur: NEGATIVE mg/dL
LEUKOCYTES UA: NEGATIVE
NITRITE: NEGATIVE
PH: 6 (ref 5.0–8.0)
Protein, ur: 30 mg/dL — AB
RBC / HPF: NONE SEEN RBC/hpf (ref 0–5)
SPECIFIC GRAVITY, URINE: 1.015 (ref 1.005–1.030)

## 2016-10-17 LAB — CBC WITH DIFFERENTIAL/PLATELET
BASOS PCT: 0 %
Basophils Absolute: 0 10*3/uL (ref 0.0–0.1)
Eosinophils Absolute: 0.1 10*3/uL (ref 0.0–0.7)
Eosinophils Relative: 1 %
HEMATOCRIT: 36.4 % (ref 36.0–46.0)
Hemoglobin: 13.1 g/dL (ref 12.0–15.0)
Lymphocytes Relative: 15 %
Lymphs Abs: 2 10*3/uL (ref 0.7–4.0)
MCH: 32.9 pg (ref 26.0–34.0)
MCHC: 36 g/dL (ref 30.0–36.0)
MCV: 91.5 fL (ref 78.0–100.0)
MONO ABS: 1.1 10*3/uL — AB (ref 0.1–1.0)
MONOS PCT: 8 %
NEUTROS ABS: 9.7 10*3/uL — AB (ref 1.7–7.7)
Neutrophils Relative %: 76 %
Platelets: 240 10*3/uL (ref 150–400)
RBC: 3.98 MIL/uL (ref 3.87–5.11)
RDW: 12.1 % (ref 11.5–15.5)
WBC: 12.9 10*3/uL — ABNORMAL HIGH (ref 4.0–10.5)

## 2016-10-17 LAB — COMPREHENSIVE METABOLIC PANEL
ALT: 46 U/L (ref 14–54)
ANION GAP: 14 (ref 5–15)
AST: 90 U/L — ABNORMAL HIGH (ref 15–41)
Albumin: 4.7 g/dL (ref 3.5–5.0)
Alkaline Phosphatase: 63 U/L (ref 38–126)
BILIRUBIN TOTAL: 0.7 mg/dL (ref 0.3–1.2)
BUN: 28 mg/dL — ABNORMAL HIGH (ref 6–20)
CO2: 23 mmol/L (ref 22–32)
Calcium: 9.6 mg/dL (ref 8.9–10.3)
Chloride: 98 mmol/L — ABNORMAL LOW (ref 101–111)
Creatinine, Ser: 1.48 mg/dL — ABNORMAL HIGH (ref 0.44–1.00)
GFR calc non Af Amer: 42 mL/min — ABNORMAL LOW (ref >60)
GFR, EST AFRICAN AMERICAN: 48 mL/min — AB (ref >60)
GLUCOSE: 111 mg/dL — AB (ref 65–99)
POTASSIUM: 2.7 mmol/L — AB (ref 3.5–5.1)
Sodium: 135 mmol/L (ref 135–145)
TOTAL PROTEIN: 8.1 g/dL (ref 6.5–8.1)

## 2016-10-17 LAB — LIPASE, BLOOD: LIPASE: 21 U/L (ref 11–51)

## 2016-10-17 MED ORDER — LORAZEPAM 2 MG/ML IJ SOLN
1.0000 mg | Freq: Once | INTRAMUSCULAR | Status: AC
  Administered 2016-10-17: 1 mg via INTRAVENOUS
  Filled 2016-10-17: qty 1

## 2016-10-17 MED ORDER — LORAZEPAM 2 MG/ML IJ SOLN
1.0000 mg | Freq: Once | INTRAMUSCULAR | Status: AC
  Administered 2016-10-17: 1 mg via INTRAVENOUS

## 2016-10-17 MED ORDER — SODIUM CHLORIDE 0.9 % IV BOLUS (SEPSIS)
1000.0000 mL | Freq: Once | INTRAVENOUS | Status: AC
  Administered 2016-10-17: 1000 mL via INTRAVENOUS

## 2016-10-17 MED ORDER — POTASSIUM CHLORIDE CRYS ER 20 MEQ PO TBCR
40.0000 meq | EXTENDED_RELEASE_TABLET | Freq: Once | ORAL | Status: AC
  Administered 2016-10-17: 40 meq via ORAL
  Filled 2016-10-17: qty 2

## 2016-10-17 MED ORDER — POTASSIUM CHLORIDE ER 10 MEQ PO TBCR: 10.0000 meq | EXTENDED_RELEASE_TABLET | Freq: Every day | ORAL | 0 refills | Status: DC

## 2016-10-17 MED ORDER — POTASSIUM CHLORIDE 10 MEQ/100ML IV SOLN
10.0000 meq | Freq: Once | INTRAVENOUS | Status: AC
  Administered 2016-10-17: 10 meq via INTRAVENOUS
  Filled 2016-10-17: qty 100

## 2016-10-17 MED ORDER — LORAZEPAM 2 MG/ML IJ SOLN
INTRAMUSCULAR | Status: AC
  Administered 2016-10-17: 1 mg via INTRAVENOUS
  Filled 2016-10-17: qty 1

## 2016-10-17 NOTE — ED Provider Notes (Signed)
[facility] DEPT Provider Note   CSN: [id number] Arrival date & time: 10/17/16  1312     History   Chief Complaint Chief Complaint  Patient presents with  . Urinary Retention    HPI Lori [name] is a 46 y.o. female.  HPI  The patient is a 46 year old female, she has a known history of HIV as well as a history of binge using cocaine and states that she has been using cocaine for the last 3 days. Over that timeframe she has also had a decrease in her urinary output and feels as though she cannot urinate. She has some discomfort in her lower back. She denies any fevers, vomiting, diarrhea. The symptoms are persistent, gradually worsening, she continues to smoke cocaine multiple times per day. She states that she also drinks heavily including vodka but states she never mixes the tube because she thinks is dangerous  Past Medical History:  Diagnosis Date  . Abnormal Pap smear    Had Cryo to treat  . Allergy   . Anemia   . HIV infection [facility])     Patient Active Problem List   Diagnosis Date Noted  . Pain management contract agreement 09/09/2016  . Headache 07/22/2014  . Essential hypertension 07/22/2014  . Back pain 12/24/2013  . Shingles 06/27/2013  . Multiple contusions 04/27/2012  . Costochondritis 04/01/2011  . 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 CELLULITIS AND ABSCESS OF OTHER SPECIFIED SITE 10/18/2007  . HAIR LOSS 10/18/2007  . SKIN RASH 10/18/2007  . ONYCHOMYCOSIS, BILATERAL 04/03/2007  . INSOMNIA 10/31/2006  . Migraine 10/31/2006  . Human immunodeficiency virus (HIV) disease (HCC) 06/27/2006  . ANXIETY DISORDER, GENERALIZED 06/27/2006  . TOBACCO ABUSE 06/27/2006  . Sinusitis, chronic 06/27/2006  . HYSTERECTOMY, TOTAL, HX OF 12/20/2005    Past Surgical History:  Procedure Laterality Date  . ABDOMINAL HYSTERECTOMY    . JOINT REPLACEMENT     left knee  . KNEE ARTHROSCOPY    . TUBAL LIGATION      OB History    Gravida Para Term Preterm AB Living     7 2 2   5 2    SAB TAB Ectopic Multiple Live Births   2 3            Home Medications    Prior to Admission medications   Medication Sig Start Date End Date Taking? Authorizing Provider  diphenhydramine-acetaminophen (TYLENOL PM) 25-500 MG TABS tablet Take 1 tablet by mouth at bedtime as needed (sleep).   Yes [provider]  elvitegravir-cobicistat-emtricitabine-tenofovir (GENVOYA) 150-150-200-10 MG TABS tablet Take 1 tablet by mouth daily with breakfast. 08/09/16  Yes [name], MD  ibuprofen (ADVIL,MOTRIN) 800 MG tablet TAKE ONE TABLET BY MOUTH AS NEEDED FOR PAIN 08/09/16  Yes [name], MD  acetaminophen-codeine (TYLENOL #3) 300-30 MG tablet Take 1-2 tablets by mouth every 4 (four) hours as needed. Patient not taking: Reported on 10/17/2016 10/05/16   Cliffton Asters, MD    Family History Family History  Problem Relation Age of Onset  . Cancer Mother     uterine and cervical  . CAD Mother     MI 2016  . Cancer Maternal Grandmother   . Cancer Maternal Grandfather   . Cancer Paternal Grandfather     Social History Social History  Substance Use Topics  . Smoking status: Current Every Day Smoker    Packs/day: 2.00    Years: 27.00    Types: Cigarettes  . Smokeless tobacco: Never Used  Comment: patient not ready to quit; considering patches  . Alcohol use 0.0 oz/week     Comment: occasional    Allergies   Doxycycline and Sulfonamide derivatives   Review of Systems Review of Systems  All other systems reviewed and are negative.    Physical Exam Updated Vital Signs BP (!) 116/102   Pulse 84   Temp 98.7 F (37.1 C) (Oral)   Resp (!) 26   Wt 198 lb (89.8 kg)   SpO2 98%   BMI 33.99 kg/m   Physical Exam  Constitutional: She appears well-developed and well-nourished.  Agitated - constantly moving around the room and the bed, can't sit still  HENT:  Head: Normocephalic and atraumatic.  Mouth/Throat: Oropharynx is clear and moist. No  oropharyngeal exudate.  Eyes: Conjunctivae and EOM are normal. Pupils are equal, round, and reactive to light. Right eye exhibits no discharge. Left eye exhibits no discharge. No scleral icterus.  Neck: Normal range of motion. Neck supple. No JVD present. No thyromegaly present.  Cardiovascular: Normal rate, regular rhythm, normal heart sounds and intact distal pulses.  Exam reveals no gallop and no friction rub.   No murmur heard. Pulmonary/Chest: Effort normal and breath sounds normal. No respiratory distress. She has no wheezes. She has no rales.  Abdominal: Soft. Bowel sounds are normal. She exhibits no distension and no mass. There is no tenderness.  Very soft and non tender abdomen - no masses or ttp in the lower abdomen  Musculoskeletal: Normal range of motion. She exhibits no edema or tenderness.  No ttp over the lower back or CVA area  Lymphadenopathy:    She has no cervical adenopathy.  Neurological: She is alert. Coordination normal.  Speech is normal and clear but she is slightly pressured - she is very agitated but moving around the room very agitated and constantly moving and writhing on the bed.  Follows commands with normal strength  Skin: Skin is warm and dry. No rash noted. No erythema.  Multiple bruises of different ages of healing ont he legs - no track marks on the arms.  Psychiatric:  Agitated Endorses cocaine binge No hallucinations  Nursing note and vitals reviewed.    ED Treatments / Results  Labs (all labs ordered are listed, but only abnormal results are displayed) Labs Reviewed  CBC WITH DIFFERENTIAL/PLATELET - Abnormal; Notable for the following:       Result Value   WBC 12.9 (*)    Neutro Abs 9.7 (*)    Monocytes Absolute 1.1 (*)    All other components within normal limits  COMPREHENSIVE METABOLIC PANEL - Abnormal; Notable for the following:    Potassium 2.7 (*)    Chloride 98 (*)    Glucose, Bld 111 (*)    BUN 28 (*)    Creatinine, Ser 1.48 (*)      AST 90 (*)    GFR calc non Af Amer 42 (*)    GFR calc Af Amer 48 (*)    All other components within normal limits  LIPASE, BLOOD  URINALYSIS, ROUTINE W REFLEX MICROSCOPIC    EKG  EKG Interpretation None       Radiology No results found.  Procedures Procedures (including critical care time)  Medications Ordered in ED Medications  LORazepam (ATIVAN) injection 1 mg (not administered)  potassium chloride 10 mEq in 100 mL IVPB (not administered)  potassium chloride SA (K-DUR,KLOR-CON) CR tablet 40 mEq (not administered)  sodium chloride 0.9 % bolus 1,000 mL (1,000  mLs Intravenous New Bag/Given 10/17/16 1418)  LORazepam (ATIVAN) injection 1 mg (1 mg Intravenous Given 10/17/16 1418)     Initial Impression / Assessment and Plan / ED Course  I have reviewed the triage vital signs and the nursing notes.  Pertinent labs & imaging results that were available during my care of the patient were reviewed by me and considered in my medical decision making (see chart for details).     The patient is not having any seizures, febrile, tachycardia or hypertension. She doesn't fact have an empty bladder on a bedside ultrasound with no signs of hydronephrosis. The development of her lower back pain is somewhat concerning as this could be some vascular phenomena related to the cocaine, this could also be musculoskeletal, I will check some labs to rule out any other source of her discomfort including a urinalysis. She does not have a pulsating mass in her abdomen to be concerned about abdominal aortic aneurysm.  Hydration / IVF  At change of shift - some results were back showing hypokalemia, dehydration with creatinine of 1.48 and a BUN of 28. She has a mild transaminitis of 98 AST however I suspect this is related to her underlying alcohol use. White blood cell count of 12,900. Urinalysis pending but the urine was pending because she has not had any urine in her bladder since arrival. She has been  given 1 L of IV fluids, she will get a second liter of IV fluids. I've given her second milligram of Ativan because she still seems agitated though she has improved significantly. Her mental status is maintained. Her potassium will be replaced with both IV and oral potassium and I anticipate discharge if the patient continues to improve and is able to make urine.  At change of shift - care discussed with and signed over to Dr. Deretha EmoryZackowski.  Pt updated on plan and in agreement.  Final Clinical Impressions(s) / ED Diagnoses   Final diagnoses:  None    New Prescriptions New Prescriptions   No medications on file     Eber HongMiller, Hoda Hon, MD 10/17/16 1538

## 2016-10-17 NOTE — ED Notes (Signed)
Pt states she has not urinated in three days, has had a BM. States lower abd pain and back pain. Denies vaginal d/c, N/V/D/. Bladder scan showing approx 20mL. During assessment pt is repeatedly getting on and off the bed, fidgeting with gown, lying on abd and then back.

## 2016-10-17 NOTE — ED Notes (Signed)
Date and time results received: 10/17/16  "3:05 PM"   Test: K+ Critical Value: 2.7   Name of Provider Notified: Hyacinth MeekerMiller  Orders Received? Or Actions Taken?: No order at this time.

## 2016-10-17 NOTE — Discharge Instructions (Signed)
Your testing shows that you have been dehydrated and you have had some kidney injury (you need to hydrate and have your kidney tests checked by your doctor in the next week). Your potassium level was low - you need to take the potassium pills daily for 5days and have your doctor recheck this in 1 week Your cocaine use is very dangerous and can cause heart attack, and other severe and serious and even life threatening problems including DEATH.  Please stop immediately  See list below for family doctors.  [facility]    Kari BaarsEdward Hawkins MD. Specialty: Pulmonary Disease Contact information: [address]  [address]  [place] [address]  [phone]   Syliva OvermanMargaret Simpson, MD. Specialty: [facility] Contact information: [address]  [place] [address]  [phone]   Lilyan PuntScott Luking, MD. Specialty: [facility] Contact information: [address]  [address]  [place] [address]  [phone]   Avon Gullyesfaye Fanta, MD Specialty: Internal Medicine Contact information: [address]  [place] [address]  [phone]   Catalina PizzaZach Hall, MD. Specialty: Internal Medicine Contact information: [address]  [place] [address]  [phone]   Butch PennyAngus Mcinnis, MD. Specialty: Family Medicine Contact information: [address]  [place] [address]  [phone]   John GiovanniStephen Knowlton, MD. Specialty: [facility] Contact information: [address]  [address]  [place] [address]  [phone]   Carylon Perchesoy Fagan, MD. Specialty: Internal Medicine Contact information: [address]  [address]  [place] [address]  [phone]

## 2016-10-17 NOTE — ED Notes (Addendum)
Pt states she is "coming off of crack" and has not slept in several days.

## 2016-10-17 NOTE — ED Triage Notes (Addendum)
Pt reports that she has been unable to void for 3 days. Reports that this has never happened in the past. Complaining of abdominal and lower back pain.

## 2016-11-01 ENCOUNTER — Other Ambulatory Visit: Payer: Self-pay | Admitting: Infectious Diseases

## 2016-11-01 NOTE — Telephone Encounter (Signed)
Please advise on refills as patient was recently in ED after 3 day cocaine binge. Lori [name], Lori [name] M, RN

## 2016-11-03 ENCOUNTER — Other Ambulatory Visit: Payer: Self-pay | Admitting: *Deleted

## 2016-11-03 ENCOUNTER — Telehealth: Payer: Self-pay | Admitting: *Deleted

## 2016-11-03 DIAGNOSIS — R52 Pain, unspecified: Secondary | ICD-10-CM

## 2016-11-03 MED ORDER — ACETAMINOPHEN-CODEINE #3 300-30 MG PO TABS: 1.0000 | ORAL_TABLET | ORAL | 0 refills | Status: DC | PRN

## 2016-11-03 NOTE — Telephone Encounter (Signed)
Verbal order from Dr. Ninetta LightsHatcher to refill Tylenol #3, Take 1 to 2 tablets every 4 hours as needed for moderate pain, #60 with no refills.

## 2016-11-03 NOTE — Telephone Encounter (Signed)
Patient requesting refills of xanax and tylenol #3.  These were discontinued by ED 5/6. Please review and advise. Andree CossHowell, Henritta Mutz M, RN

## 2016-11-03 NOTE — Telephone Encounter (Signed)
Orders cancelled due to noncompliance with pain contract per Dr Ninetta LightsHatcher.  RN notified pharmacy, spoke with [place] [facility]. RN spoke with patient, she verbalized understanding, stating she expected this after her hospitalization.  She would like the referral to a pain clinic in [place] as Dr Ninetta LightsHatcher suggested.  Verbal order placed per Dr Ninetta LightsHatcher for pain clinic referral.  Andree CossHowell, Michelle M, RN

## 2016-11-03 NOTE — Telephone Encounter (Signed)
Spoke with RN- have canceled rx due to recent cocaine use.  Pt will need to f/u with pain clinic.

## 2016-11-03 NOTE — Addendum Note (Signed)
Addended by: Andree CossHOWELL, MICHELLE M on: 11/03/2016 04:51 PM   Modules accepted: Orders

## 2016-11-24 ENCOUNTER — Ambulatory Visit: Payer: Medicare Other | Admitting: Infectious Diseases

## 2016-11-26 ENCOUNTER — Telehealth: Payer: Self-pay | Admitting: *Deleted

## 2016-11-26 NOTE — Telephone Encounter (Signed)
Voice mail left from patient regarding her referral to a pain clinic. She said that she has been taking goody powders and ibuprofen "like crazy" since she is no longer getting pain meds from Dr. Ninetta LightsHatcher. It looked like her referral may have been put in incorrectly. I printed it out and gave to Joesphine Bareiane Foster-Smith, referral coordinator and asked her to call the patient back. Wendall MolaJacqueline Corbyn Steedman

## 2016-11-30 ENCOUNTER — Telehealth: Payer: Self-pay | Admitting: Lab

## 2016-11-30 NOTE — Telephone Encounter (Signed)
I returned the patient's call regarding her referral to a Pain Management office.  I had to leave a message explaining how the process works and if she had any additional questions to give me a call back.

## 2017-01-17 ENCOUNTER — Ambulatory Visit: Payer: Medicare Other | Admitting: Infectious Diseases

## 2017-03-28 ENCOUNTER — Other Ambulatory Visit: Payer: Medicare Other

## 2017-04-07 ENCOUNTER — Telehealth: Payer: Self-pay

## 2017-04-07 NOTE — Telephone Encounter (Signed)
Patient called to reschedule her appt with Dr. Ninetta LightsHatcher and was going to reschedule her missed lab appt. In her call she stated that she stopped taking her Genvoya because she "didn't like it" and was hoping to start a new regimen. She also states that she "feels good" and did not sounds concern that she has been off meds.  Lori [name], [place]

## 2017-04-07 NOTE — Telephone Encounter (Signed)
error 

## 2017-04-18 ENCOUNTER — Ambulatory Visit: Payer: Medicare Other | Admitting: Infectious Diseases

## 2017-04-18 ENCOUNTER — Other Ambulatory Visit: Payer: Medicare Other

## 2017-05-02 ENCOUNTER — Ambulatory Visit: Payer: Medicare Other | Admitting: Infectious Diseases

## 2017-05-16 ENCOUNTER — Other Ambulatory Visit (HOSPITAL_COMMUNITY)
Admission: RE | Admit: 2017-05-16 | Discharge: 2017-05-16 | Disposition: A | Discharge status: Home / Self Care | Payer: Medicare Other | Source: Ambulatory Visit | Attending: Infectious Diseases | Admitting: Infectious Diseases

## 2017-05-16 ENCOUNTER — Ambulatory Visit (INDEPENDENT_AMBULATORY_CARE_PROVIDER_SITE_OTHER): Payer: Medicare Other | Admitting: Pharmacist Clinician (PhC)/ Clinical Pharmacy Specialist

## 2017-05-16 DIAGNOSIS — B20 Human immunodeficiency virus [HIV] disease: Secondary | ICD-10-CM

## 2017-05-16 DIAGNOSIS — Z23 Encounter for immunization: Secondary | ICD-10-CM

## 2017-05-16 NOTE — Addendum Note (Signed)
Addended by: Mariea ClontsGREEN, Kianna Billet D on: 05/16/2017 05:36 PM   Modules accepted: Orders

## 2017-05-16 NOTE — Progress Notes (Signed)
HPI: Lori [name] is a 46 y.o. female who is here to see pharmacy after multiple no shows.   Allergies: Allergies  Allergen Reactions  . Doxycycline Swelling  . Sulfonamide Derivatives Other (See Comments)    Childhood allergy.    Vitals:    Past Medical History: Past Medical History:  Diagnosis Date  . Abnormal Pap smear    Had Cryo to treat  . Allergy   . Anemia   . HIV infection [facility](HCC)     Social History: Social History   Socioeconomic History  . Marital status: Single    Spouse name: Not on file  . Number of children: Not on file  . Years of education: Not on file  . Highest education level: Not on file  Social Needs  . Financial resource strain: Not on file  . Food insecurity - worry: Not on file  . Food insecurity - inability: Not on file  . Transportation needs - medical: Not on file  . Transportation needs - non-medical: Not on file  Occupational History  . Not on file  Tobacco Use  . Smoking status: Current Every Day Smoker    Packs/day: 2.00    Years: 27.00    Pack years: 54.00    Types: Cigarettes  . Smokeless tobacco: Never Used  . Tobacco comment: patient not ready to quit; considering patches  Substance and Sexual Activity  . Alcohol use: Yes    Alcohol/week: 0.0 oz    Comment: occasional  . Drug use: Yes    Frequency: 7.0 times per week    Types: Marijuana, Cocaine  . Sexual activity: Yes    Birth control/protection: Surgical, Condom    Comment: pt. declined condoms  Other Topics Concern  . Not on file  Social History Narrative  . Not on file    Previous Regimen: ATP>>Genvoya  Current Regimen: Off  Labs: HIV 1 RNA Quant (copies/mL)  Date Value  07/20/2016 <20 NOT DETECTED  03/01/2016 <20  07/07/2015 <20   CD4 T Cell Abs (/uL)  Date Value  07/20/2016 670  03/01/2016 530  07/07/2015 550   Hep B S Ab (no units)  Date Value  07/07/2015 NEG   Hepatitis B Surface Ag (no units)  Date Value  08/08/2006 NO   HCV Ab  (no units)  Date Value  08/08/2006 NO    CrCl: CrCl cannot be calculated (Patient's most recent lab result is older than the maximum 21 days allowed.).  Lipids:    Component Value Date/Time   CHOL 213 (H) 07/20/2016 1107   TRIG 126 07/20/2016 1107   HDL 45 (L) 07/20/2016 1107   CHOLHDL 4.7 07/20/2016 1107   VLDL 25 07/20/2016 1107   LDLCALC 143 (H) 07/20/2016 1107    Assessment: Lori [name] has not seen us since February of this year. She was previously pretty well controlled on ATP>>Genvoya. She has been off of therapy since about May of this year. She said that she has been missing those appts because [place] and she doesn't have any transportation. She got her mom to take her to the appt today.   She has a tremendous issue with psychological disorders (anxiety, bipolar?). She is not seeing anyone for management. Asked about if she is having any suicidal thoughts, she said no. Offered her to have a sit down session with Cordelia PenSherry. She refused because her mom is sitting in the car waiting.   She started to space out her  Genvoya around May and stopped it completely in June. She attributed the Genvoya to making her thoughts and behavior worse. Therefore, she stopped it completely. Apparently, she thinks that the [facility] is worse than the ATP. I really have a bad feeling about her resistance now. She wants to f/u with Dr. [name] and not other providers. We are going to get all labs today.   She did admitted to cocaine use but not in a "few" months. We are going to get the UDS today also. She denied IVDA.   Giving her flu, hep A/B, 2nd Menveo today. She is going to ask her mom to bring her again to the appt.    Recommendations:  All HIV labs with reflex to genotype Hep C  Hep A/B/Menveo/flu F/u with Dr [name] in 1 month to restart therapy  [name], PharmD, BCPS, AAHIVP, CPP Clinical Infectious Disease Pharmacist [facility] 05/16/2017, 1:56 PM

## 2017-05-17 ENCOUNTER — Telehealth: Payer: Self-pay | Admitting: *Deleted

## 2017-05-17 LAB — PAIN MGMT, PROFILE 1 W/O CONF, U
Amphetamines: NEGATIVE ng/mL (ref <500)
BARBITURATES: NEGATIVE ng/mL (ref <300)
Benzodiazepines: NEGATIVE ng/mL (ref <100)
COCAINE METABOLITE: NEGATIVE ng/mL (ref <150)
Creatinine: 36.9 mg/dL
METHADONE METABOLITE: NEGATIVE ng/mL (ref <100)
Marijuana Metabolite: POSITIVE ng/mL — AB (ref <20)
Opiates: NEGATIVE ng/mL (ref <100)
Oxidant: NEGATIVE ug/mL (ref <200)
Oxycodone: NEGATIVE ng/mL (ref <100)
PH: 6.95 (ref 4.5–9.0)
PHENCYCLIDINE: NEGATIVE ng/mL (ref <25)

## 2017-05-17 LAB — T-HELPER CELL (CD4) - (RCID CLINIC ONLY)
CD4 T CELL HELPER: 25 % — AB (ref 33–55)
CD4 T Cell Abs: 400 /uL (ref 400–2700)

## 2017-05-17 NOTE — Telephone Encounter (Signed)
Spoke with patient, relayed results per Dr Ninetta LightsHatcher. Patient will come Monday 12/3 11:00 for her injection of bicillin 2.4 million units. She is not able to come any earlier than that. Andree CossHowell, Kensie Susman M, RN   Ginnie SmartHatcher, Jeffrey C, MD  [facility] Triage Nurse Pool        Needs benzathine Pen G 2.4 million units IM x1  Thanks

## 2017-05-18 LAB — HCV RNA,QN PCR RFLX GENO, LIPABAD

## 2017-05-18 LAB — URINE CYTOLOGY ANCILLARY ONLY
Chlamydia: NEGATIVE
Neisseria Gonorrhea: NEGATIVE

## 2017-05-19 LAB — COMPLETE METABOLIC PANEL WITH GFR
AG RATIO: 1.5 (calc) (ref 1.0–2.5)
ALKALINE PHOSPHATASE (APISO): 97 U/L (ref 33–115)
ALT: 31 U/L — AB (ref 6–29)
AST: 29 U/L (ref 10–35)
Albumin: 4.2 g/dL (ref 3.6–5.1)
BILIRUBIN TOTAL: 0.3 mg/dL (ref 0.2–1.2)
BUN: 12 mg/dL (ref 7–25)
CHLORIDE: 104 mmol/L (ref 98–110)
CO2: 26 mmol/L (ref 20–32)
Calcium: 9 mg/dL (ref 8.6–10.2)
Creat: 0.74 mg/dL (ref 0.50–1.10)
GFR, EST AFRICAN AMERICAN: 113 mL/min/{1.73_m2} (ref >=60)
GFR, Est Non African American: 97 mL/min/{1.73_m2} (ref >=60)
GLUCOSE: 80 mg/dL (ref 65–99)
Globulin: 2.8 g/dL (calc) (ref 1.9–3.7)
POTASSIUM: 3.6 mmol/L (ref 3.5–5.3)
Sodium: 140 mmol/L (ref 135–146)
TOTAL PROTEIN: 7 g/dL (ref 6.1–8.1)

## 2017-05-19 LAB — RPR TITER

## 2017-05-19 LAB — RPR: RPR: REACTIVE — AB

## 2017-05-19 LAB — FLUORESCENT TREPONEMAL AB(FTA)-IGG-BLD: Fluorescent Treponemal ABS: REACTIVE — AB

## 2017-05-20 ENCOUNTER — Ambulatory Visit: Payer: Medicare Other

## 2017-05-20 ENCOUNTER — Telehealth: Payer: Self-pay | Admitting: *Deleted

## 2017-05-20 ENCOUNTER — Ambulatory Visit (HOSPITAL_COMMUNITY)
Admission: EM | Admit: 2017-05-20 | Discharge: 2017-05-20 | Disposition: A | Discharge status: Home / Self Care | Payer: Medicare Other | Attending: Internal Medicine | Admitting: Internal Medicine

## 2017-05-20 ENCOUNTER — Encounter (HOSPITAL_COMMUNITY): Payer: Self-pay | Admitting: Family Medicine

## 2017-05-20 DIAGNOSIS — A539 Syphilis, unspecified: Secondary | ICD-10-CM | POA: Diagnosis not present

## 2017-05-20 MED ORDER — PENICILLIN G BENZATHINE 1200000 UNIT/2ML IM SUSP
INTRAMUSCULAR | Status: AC
  Filled 2017-05-20: qty 4

## 2017-05-20 MED ORDER — PENICILLIN G BENZATHINE 1200000 UNIT/2ML IM SUSP
2.4000 10*6.[IU] | Freq: Once | INTRAMUSCULAR | Status: AC
  Administered 2017-05-20: 2.4 10*6.[IU] via INTRAMUSCULAR

## 2017-05-20 NOTE — ED Notes (Signed)
No reaction present pt discharged.

## 2017-05-20 NOTE — Discharge Instructions (Addendum)
Injection of penicillin G given at the urgent care today.  Please follow-up with Dr. Ninetta LightsHatcher as scheduled.

## 2017-05-20 NOTE — Telephone Encounter (Signed)
Patient came into clinic for treatment of syphilis with bicillin 2 million IM x one and mentioned she had a childhood allergy to penicillin. She can not be treated with doxycycline due to an allergy also. Dr. Ninetta LightsHatcher advised that patient needs to go to Urgent Care or ED to receive treatment and be monitored. Called Urgent Care and advised them of the situation and to make sure they had bicillin. They do and patient is headed there now. Patient is thinking that she has had this for awhile and has done some research online and states she has had mental confusion, sore throat, and tingling in the back of her head. Dr. Ninetta LightsHatcher spoke to her and is aware. She has a follow up with MD in January 2019.

## 2017-05-20 NOTE — ED Triage Notes (Signed)
Pt here for treatment for syphilis  

## 2017-05-20 NOTE — ED Notes (Signed)
Pt waiting to ensure that she doesn't have a reaction to PCN.

## 2017-05-20 NOTE — ED Provider Notes (Signed)
[facility]    CSN: [id number] Arrival date & time: 05/20/17  1136     History   Chief Complaint Chief Complaint  Patient presents with  . Exposure to STD    HPI Lori [name] is a 46 y.o. female.   She had a recent positive test for syphilis, high titer, and is here for an injection of penicillin.  [facility]  HPI  Past Medical History:  Diagnosis Date  . Abnormal Pap smear    Had Cryo to treat  . Allergy   . Anemia   . HIV infection [facility](HCC)     Patient Active Problem List   Diagnosis Date Noted  . Pain management contract agreement 09/09/2016  . Headache 07/22/2014  . Essential hypertension 07/22/2014  . Back pain 12/24/2013  . Shingles 06/27/2013  . Multiple contusions 04/27/2012  . Costochondritis 04/01/2011  . 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 CELLULITIS AND ABSCESS OF OTHER SPECIFIED SITE 10/18/2007  . HAIR LOSS 10/18/2007  . SKIN RASH 10/18/2007  . ONYCHOMYCOSIS, BILATERAL 04/03/2007  . INSOMNIA 10/31/2006  . Migraine 10/31/2006  . Human immunodeficiency virus (HIV) disease (HCC) 06/27/2006  . ANXIETY DISORDER, GENERALIZED 06/27/2006  . TOBACCO ABUSE 06/27/2006  . Sinusitis, chronic 06/27/2006  . HYSTERECTOMY, TOTAL, HX OF 12/20/2005    Past Surgical History:  Procedure Laterality Date  . ABDOMINAL HYSTERECTOMY    . JOINT REPLACEMENT     left knee  . KNEE ARTHROSCOPY    . TUBAL LIGATION      OB History    Gravida Para Term Preterm AB Living   7 2 2   5 2    SAB TAB Ectopic Multiple Live Births   2 3             Home Medications    Prior to Admission medications   Medication Sig Start Date End Date Taking? Authorizing Provider  acetaminophen (TYLENOL) 325 MG tablet Take 650 mg by mouth every 6 (six) hours as needed.    [provider]  diphenhydramine-acetaminophen (TYLENOL PM) 25-500 MG TABS tablet Take 1 tablet by mouth at bedtime as needed (sleep).    [provider]    elvitegravir-cobicistat-emtricitabine-tenofovir (GENVOYA) 150-150-200-10 MG TABS tablet Take 1 tablet by mouth daily with breakfast. Patient not taking: Reported on 05/16/2017 08/09/16   Ginnie SmartHatcher, Jeffrey C, MD  ibuprofen (ADVIL,MOTRIN) 800 MG tablet TAKE ONE TABLET BY MOUTH AS NEEDED FOR PAIN Patient not taking: Reported on 05/16/2017 08/09/16   Ginnie SmartHatcher, Jeffrey C, MD  potassium chloride (K-DUR) 10 MEQ tablet Take 1 tablet (10 mEq total) by mouth daily. Patient not taking: Reported on 05/16/2017 10/17/16   Eber HongMiller, Brian, MD    Family History Family History  Problem Relation Age of Onset  . Cancer Mother        uterine and cervical  . CAD Mother        MI 2016  . Cancer Maternal Grandmother   . Cancer Maternal Grandfather   . Cancer Paternal Grandfather     Social History Social History   Tobacco Use  . Smoking status: Current Every Day Smoker    Packs/day: 2.00    Years: 27.00    Pack years: 54.00    Types: Cigarettes  . Smokeless tobacco: Never Used  . Tobacco comment: patient not ready to quit; considering patches  Substance Use Topics  . Alcohol use: Yes    Alcohol/week: 0.0 oz    Comment: occasional  . Drug use: Yes  Frequency: 7.0 times per week    Types: Marijuana, Cocaine     Allergies   Doxycycline and Sulfonamide derivatives   Review of Systems Review of Systems  All other systems reviewed and are negative.    Physical Exam Vitals:   05/20/17 1301  BP: 122/85  Pulse: 85  Resp: 18  Temp: 98.2 F (36.8 C)  SpO2: 100%    Physical Exam  Constitutional: She is oriented to person, place, and time. No distress.  HENT:  Head: Atraumatic.  Eyes:  Conjugate gaze observed, no eye redness/discharge  Neck: Neck supple.  Cardiovascular: Normal rate.  Pulmonary/Chest: No respiratory distress.  Abdominal: She exhibits no distension.  Musculoskeletal: Normal range of motion.  Neurological: She is alert and oriented to person, place, and time.  Skin: Skin  is warm and dry.  Nursing note and vitals reviewed.    UC Treatments / Results   Procedures Procedures (including critical care time)  Medications Ordered in UC Medications  penicillin g benzathine (BICILLIN LA) 1200000 UNIT/2ML injection 2.4 Million Units (2.4 Million Units Intramuscular Given 05/20/17 1238)     Final Clinical Impressions(s) / UC Diagnoses   Final diagnoses:  Syphilis    Injection of penicillin G given at the urgent care today.  Please follow-up with Dr. Ninetta LightsHatcher as scheduled.   Controlled Substance Prescriptions [place] Controlled Substance Registry consulted? No   Eustace MooreMurray, Caralina Nop W, MD 05/20/17 [phone]

## 2017-05-23 ENCOUNTER — Ambulatory Visit: Payer: Medicare Other

## 2017-05-28 LAB — HIV RNA, RTPCR W/R GT (RTI, PI,INT)
HIV 1 RNA Quant: 109000 copies/mL — ABNORMAL HIGH
HIV-1 RNA Quant, Log: 5.04 Log copies/mL — ABNORMAL HIGH

## 2017-05-28 LAB — HIV-1 INTEGRASE GENOTYPE

## 2017-05-28 LAB — HIV-1 GENOTYPE: HIV-1 Genotype: DETECTED — AB

## 2017-06-15 ENCOUNTER — Ambulatory Visit: Payer: Medicare Other | Admitting: Infectious Diseases

## 2017-06-27 ENCOUNTER — Ambulatory Visit: Payer: Medicare Other | Admitting: Infectious Diseases

## 2017-07-11 ENCOUNTER — Ambulatory Visit: Payer: Medicare Other | Admitting: Infectious Diseases

## 2017-07-18 ENCOUNTER — Encounter: Payer: Self-pay | Admitting: Infectious Diseases

## 2017-07-18 ENCOUNTER — Other Ambulatory Visit: Payer: Self-pay | Admitting: *Deleted

## 2017-07-18 ENCOUNTER — Ambulatory Visit (INDEPENDENT_AMBULATORY_CARE_PROVIDER_SITE_OTHER): Payer: Medicare Other | Admitting: Infectious Diseases

## 2017-07-18 VITALS — BP 147/98 | HR 67 | Temp 98.0°F | Wt 185.0 lb

## 2017-07-18 DIAGNOSIS — A539 Syphilis, unspecified: Secondary | ICD-10-CM | POA: Diagnosis not present

## 2017-07-18 DIAGNOSIS — Z202 Contact with and (suspected) exposure to infections with a predominantly sexual mode of transmission: Secondary | ICD-10-CM | POA: Diagnosis not present

## 2017-07-18 DIAGNOSIS — Z113 Encounter for screening for infections with a predominantly sexual mode of transmission: Secondary | ICD-10-CM | POA: Diagnosis not present

## 2017-07-18 DIAGNOSIS — B20 Human immunodeficiency virus [HIV] disease: Secondary | ICD-10-CM | POA: Diagnosis not present

## 2017-07-18 DIAGNOSIS — F411 Generalized anxiety disorder: Secondary | ICD-10-CM

## 2017-07-18 DIAGNOSIS — R21 Rash and other nonspecific skin eruption: Secondary | ICD-10-CM | POA: Diagnosis not present

## 2017-07-18 DIAGNOSIS — A630 Anogenital (venereal) warts: Secondary | ICD-10-CM

## 2017-07-18 DIAGNOSIS — Z79899 Other long term (current) drug therapy: Secondary | ICD-10-CM

## 2017-07-18 MED ORDER — PENICILLIN G BENZATHINE 1200000 UNIT/2ML IM SUSP
1.2000 10*6.[IU] | Freq: Once | INTRAMUSCULAR | Status: AC
  Administered 2017-07-18: 1.2 10*6.[IU] via INTRAMUSCULAR

## 2017-07-18 MED ORDER — ELVITEG-COBIC-EMTRICIT-TENOFAF 150-150-200-10 MG PO TABS: 1.0000 | ORAL_TABLET | Freq: Every day | ORAL | 3 refills | Status: DC

## 2017-07-18 MED ORDER — TERBINAFINE HCL 250 MG PO TABS: 250.0000 mg | ORAL_TABLET | Freq: Every day | ORAL | Status: DC

## 2017-07-18 MED ORDER — TERBINAFINE HCL 250 MG PO TABS: 250.0000 mg | ORAL_TABLET | Freq: Every day | ORAL | 0 refills | Status: DC

## 2017-07-18 MED ORDER — PENICILLIN G BENZATHINE 1200000 UNIT/2ML IM SUSP: 2.4000 10*6.[IU] | Freq: Once | INTRAMUSCULAR | Status: DC

## 2017-07-18 NOTE — Assessment & Plan Note (Signed)
She will not allow me to look at this but does want lamisil rx.

## 2017-07-18 NOTE — Addendum Note (Signed)
Addended by: Lorenso CourierMALDONADO, JOSE L on: 07/18/2017 01:13 PM   Modules accepted: Orders

## 2017-07-18 NOTE — Assessment & Plan Note (Signed)
She is convinced she has not been treated completely.  Will give her additional injection, she is allergic to doxy.

## 2017-07-18 NOTE — Assessment & Plan Note (Signed)
Will get her in with GYN.  

## 2017-07-18 NOTE — Assessment & Plan Note (Signed)
Will get her in with PCP.  

## 2017-07-18 NOTE — Progress Notes (Signed)
   Subjective:    Patient ID: Lori BoozeCristina J Jerome, female    DOB: 02/03/1971, 47 y.o.   MRN: [id number]  HPI 47yo F with HIV+, anxiety d/o, anal/vaginal warts.  No problems with atripla.  Had PAP 09-2012 NL, 06-24-14 NL.  Previous hysterectomy.  Had HRA 01-2013-has not been using podoxyphilin for vaginal warts. Changed to imiqoud.  Getting refills of Tylenol 3, xanax  Tried odefsy but couldn't tolerate.   She was switched to genvoya last year but quit taking in July due to concerns about mental health issues. She now thinks this was due to syphilis. She was treated for this at urgent care 05-2017. continues to smoke.   She had genotype 05-2017 that was pan-sensitive She has had some cocaine use as well.    HIV 1 RNA Quant (copies/mL)  Date Value  05/16/2017 109,000 (H)  07/20/2016 <20 NOT DETECTED  03/01/2016 <20   CD4 T Cell Abs (/uL)  Date Value  05/16/2017 400  07/20/2016 670  03/01/2016 530    Review of Systems  HENT: Positive for sore throat.   Respiratory: Negative for cough and shortness of breath.   Gastrointestinal: Negative for constipation and diarrhea.  Genitourinary: Negative for difficulty urinating.  Neurological: Positive for headaches.  Psychiatric/Behavioral: Positive for dysphoric mood. The patient is nervous/anxious.   Please see HPI. All other systems reviewed and negative.      Objective:   Physical Exam  Constitutional: She appears well-developed and well-nourished.  HENT:  Mouth/Throat: No oropharyngeal exudate.  Eyes: EOM are normal. Pupils are equal, round, and reactive to light.  Neck: Neck supple.  Cardiovascular: Normal rate, regular rhythm and normal heart sounds.  Pulmonary/Chest: Effort normal and breath sounds normal.  Abdominal: Soft. Bowel sounds are normal. There is no tenderness. There is no rebound.  Musculoskeletal: She exhibits no edema.  Lymphadenopathy:    She has no cervical adenopathy.  Psychiatric: Her mood appears  anxious. Her affect is blunt. Her speech is rapid and/or pressured. She does not exhibit a depressed mood.      Assessment & Plan:

## 2017-07-18 NOTE — Assessment & Plan Note (Addendum)
Will restart her on genvoya.  Will get her in with PCP given condoms.  Set up for PAP rtc in 3 months.

## 2017-11-11 ENCOUNTER — Emergency Department (HOSPITAL_COMMUNITY)
Admission: EM | Admit: 2017-11-11 | Discharge: 2017-11-11 | Disposition: A | Discharge status: Home / Self Care | Payer: Medicare Other | Attending: Emergency Medicine | Admitting: Emergency Medicine

## 2017-11-11 ENCOUNTER — Telehealth: Payer: Self-pay | Admitting: *Deleted

## 2017-11-11 ENCOUNTER — Emergency Department (HOSPITAL_COMMUNITY): Payer: Medicare Other

## 2017-11-11 ENCOUNTER — Encounter (HOSPITAL_COMMUNITY): Payer: Self-pay

## 2017-11-11 DIAGNOSIS — R05 Cough: Secondary | ICD-10-CM | POA: Insufficient documentation

## 2017-11-11 DIAGNOSIS — F17210 Nicotine dependence, cigarettes, uncomplicated: Secondary | ICD-10-CM | POA: Diagnosis not present

## 2017-11-11 DIAGNOSIS — R059 Cough, unspecified: Secondary | ICD-10-CM

## 2017-11-11 DIAGNOSIS — R079 Chest pain, unspecified: Secondary | ICD-10-CM | POA: Diagnosis not present

## 2017-11-11 DIAGNOSIS — I10 Essential (primary) hypertension: Secondary | ICD-10-CM | POA: Diagnosis not present

## 2017-11-11 LAB — CBC
HCT: 38.2 % (ref 36.0–46.0)
Hemoglobin: 13.2 g/dL (ref 12.0–15.0)
MCH: 32.6 pg (ref 26.0–34.0)
MCHC: 34.6 g/dL (ref 30.0–36.0)
MCV: 94.3 fL (ref 78.0–100.0)
PLATELETS: 219 10*3/uL (ref 150–400)
RBC: 4.05 MIL/uL (ref 3.87–5.11)
RDW: 13 % (ref 11.5–15.5)
WBC: 9.7 10*3/uL (ref 4.0–10.5)

## 2017-11-11 LAB — DIFFERENTIAL
BASOS PCT: 0 %
Basophils Absolute: 0 10*3/uL (ref 0.0–0.1)
EOS ABS: 0.1 10*3/uL (ref 0.0–0.7)
EOS PCT: 1 %
Lymphocytes Relative: 12 %
Lymphs Abs: 1.2 10*3/uL (ref 0.7–4.0)
MONO ABS: 0.5 10*3/uL (ref 0.1–1.0)
Monocytes Relative: 6 %
NEUTROS ABS: 7.9 10*3/uL — AB (ref 1.7–7.7)
Neutrophils Relative %: 81 %

## 2017-11-11 LAB — BASIC METABOLIC PANEL
Anion gap: 9 (ref 5–15)
BUN: 11 mg/dL (ref 6–20)
CHLORIDE: 104 mmol/L (ref 101–111)
CO2: 27 mmol/L (ref 22–32)
Calcium: 9.5 mg/dL (ref 8.9–10.3)
Creatinine, Ser: 0.76 mg/dL (ref 0.44–1.00)
GFR calc Af Amer: >60 mL/min (ref >60)
Glucose, Bld: 101 mg/dL — ABNORMAL HIGH (ref 65–99)
Potassium: 3.9 mmol/L (ref 3.5–5.1)
SODIUM: 140 mmol/L (ref 135–145)

## 2017-11-11 LAB — TROPONIN I: Troponin I: <0.03 ng/mL (ref <0.03)

## 2017-11-11 LAB — HEPATIC FUNCTION PANEL
ALT: 15 U/L (ref 14–54)
AST: 16 U/L (ref 15–41)
Albumin: 4.3 g/dL (ref 3.5–5.0)
Alkaline Phosphatase: 67 U/L (ref 38–126)
Total Bilirubin: 0.7 mg/dL (ref 0.3–1.2)
Total Protein: 7.9 g/dL (ref 6.5–8.1)

## 2017-11-11 LAB — T-HELPER CELLS (CD4) COUNT (NOT AT ARMC)
CD4 T CELL HELPER: 19 % — AB (ref 33–55)
CD4 T Cell Abs: 180 /uL — ABNORMAL LOW (ref 400–2700)

## 2017-11-11 MED ORDER — LEVOFLOXACIN 500 MG PO TABS: 500.0000 mg | ORAL_TABLET | Freq: Every day | ORAL | 0 refills | Status: DC

## 2017-11-11 MED ORDER — ALBUTEROL SULFATE HFA 108 (90 BASE) MCG/ACT IN AERS
2.0000 | INHALATION_SPRAY | RESPIRATORY_TRACT | Status: DC | PRN
  Administered 2017-11-11: 2 via RESPIRATORY_TRACT
  Filled 2017-11-11: qty 6.7

## 2017-11-11 NOTE — Telephone Encounter (Signed)
Received message that Lori [name] was seen today at [facility], diagnosed with pneumonia, has been off meds for a while due to side effects. RN reached out to patient and offered an appointment with DR Ninetta LightsHatcher 6/3 at 3:15.  Tentatively scheduled Lori [name] pending her call back. Andree CossHowell, Marialy Urbanczyk M, RN

## 2017-11-11 NOTE — Discharge Instructions (Addendum)
Follow-up with your doctor next week with a phone call to his office and let them know we saw you this weekend

## 2017-11-11 NOTE — ED Notes (Signed)
Patient transported to X-ray 

## 2017-11-11 NOTE — ED Triage Notes (Addendum)
Pt reports cough for one week with productive cough. Intermittent CP that is worse with coughing and lying down. CP for 2 weeks. SOB with exertion. Reports she is HIV positive and does not take meds due to making her sick. Pt also reports she has had several tick bites over the last 2 weeks

## 2017-11-14 ENCOUNTER — Ambulatory Visit: Payer: Medicare Other | Admitting: Infectious Diseases

## 2017-11-14 ENCOUNTER — Other Ambulatory Visit: Payer: Medicare Other

## 2017-11-16 ENCOUNTER — Ambulatory Visit (INDEPENDENT_AMBULATORY_CARE_PROVIDER_SITE_OTHER): Payer: Medicare Other | Admitting: Infectious Diseases

## 2017-11-16 ENCOUNTER — Encounter: Payer: Self-pay | Admitting: Infectious Diseases

## 2017-11-16 VITALS — BP 145/107 | HR 76 | Temp 98.4°F | Ht 64.0 in | Wt 171.0 lb

## 2017-11-16 DIAGNOSIS — A539 Syphilis, unspecified: Secondary | ICD-10-CM | POA: Diagnosis not present

## 2017-11-16 DIAGNOSIS — F411 Generalized anxiety disorder: Secondary | ICD-10-CM | POA: Diagnosis not present

## 2017-11-16 DIAGNOSIS — F1410 Cocaine abuse, uncomplicated: Secondary | ICD-10-CM | POA: Diagnosis not present

## 2017-11-16 DIAGNOSIS — R911 Solitary pulmonary nodule: Secondary | ICD-10-CM

## 2017-11-16 DIAGNOSIS — F17200 Nicotine dependence, unspecified, uncomplicated: Secondary | ICD-10-CM

## 2017-11-16 DIAGNOSIS — Z23 Encounter for immunization: Secondary | ICD-10-CM

## 2017-11-16 DIAGNOSIS — B20 Human immunodeficiency virus [HIV] disease: Secondary | ICD-10-CM

## 2017-11-16 DIAGNOSIS — R918 Other nonspecific abnormal finding of lung field: Secondary | ICD-10-CM

## 2017-11-16 MED ORDER — BICTEGRAVIR-EMTRICITAB-TENOFOV 50-200-25 MG PO TABS
1.0000 | ORAL_TABLET | Freq: Every day | ORAL | 3 refills | Status: DC
Start: 2017-11-16 — End: 2017-11-16

## 2017-11-16 MED ORDER — BICTEGRAVIR-EMTRICITAB-TENOFOV 50-200-25 MG PO TABS: 1.0000 | ORAL_TABLET | Freq: Every day | ORAL | 3 refills | Status: DC

## 2017-11-16 MED FILL — BIKTARVY 50-200-25 MG TABS: 50-200-25 | 30 days supply | Qty: 30 | Fill #0

## 2017-11-16 NOTE — Progress Notes (Signed)
   Subjective:    Patient ID: Lori [name], female    DOB: 04/23/1971, 47 y.o.   MRN: [id number]  HPI 47yo F with HIV+, anxiety d/o, anal/vaginal warts.  Previously on atripla (made her depressed suicidal). tried odefsy but couldn't tolerate. Then changed to genvoya which she went off of . States it makes her throw up.  Had PAP 09-2012 NL, 06-24-14 NL.  Previous hysterectomy. Had HRA 01-2013- previously on imiqoud.   Was in hospital on 5-31 for bronchitis. She still has some CP from this, better though. Finishing course of levaquin. Producing sputum.  Cough has decreased.  Son is doing well, 47 yo.  Has lost 14# .  HIV 1 RNA Quant (copies/mL)  Date Value  05/16/2017 109,000 (H)  07/20/2016 <20 NOT DETECTED  03/01/2016 <20   CD4 T Cell Abs (/uL)  Date Value  11/11/2017 180 (L)  05/16/2017 400  07/20/2016 670     Review of Systems  Constitutional: Positive for appetite change and unexpected weight change. Negative for chills and fever.  Respiratory: Positive for cough. Negative for shortness of breath.   Gastrointestinal: Negative for constipation and diarrhea.  Genitourinary: Negative for difficulty urinating.  Neurological: Positive for headaches.  Psychiatric/Behavioral: Positive for sleep disturbance. The patient is nervous/anxious.   takes tylenol pm.  Please see HPI. All other systems reviewed and negative.     Objective:   Physical Exam  Constitutional: She is oriented to person, place, and time. She appears well-developed and well-nourished.  HENT:  Mouth/Throat: No oropharyngeal exudate.  Eyes: Pupils are equal, round, and reactive to light. EOM are normal.  Neck: Normal range of motion. Neck supple.  Cardiovascular: Normal rate, regular rhythm and normal heart sounds.  Pulmonary/Chest: Effort normal and breath sounds normal. No accessory muscle usage. No tachypnea and no bradypnea. No respiratory distress.  Abdominal: Soft. Bowel sounds are normal.  She exhibits no distension. There is no tenderness.  Musculoskeletal: She exhibits no edema.  Lymphadenopathy:    She has no cervical adenopathy.  Neurological: She is alert and oriented to person, place, and time.  Skin: Skin is warm and dry.  Psychiatric: Her mood appears anxious.          Assessment & Plan:

## 2017-11-16 NOTE — Assessment & Plan Note (Addendum)
Will try her on biktarvy She has condoms.  Tdap and prevnar today.  Will have her meet pharm. They will see her back in 6-8 weeks.  rtc in 8-10 weeks.

## 2017-11-16 NOTE — Assessment & Plan Note (Signed)
She continues to use sporadically.  I offered to have our counselor see her. She defers.

## 2017-11-16 NOTE — Assessment & Plan Note (Signed)
Will repeat her serologies today.

## 2017-11-16 NOTE — Assessment & Plan Note (Signed)
Encouraged to quit. 

## 2017-11-16 NOTE — Assessment & Plan Note (Signed)
Will check CT in 1 month.

## 2017-11-16 NOTE — Assessment & Plan Note (Signed)
I offered to have her seen by counselor. She defers.

## 2017-11-16 NOTE — Progress Notes (Signed)
HPI: Lori [name] is a 47 y.o. female who is here for her visit with Dr. Ninetta LightsHatcher.   Allergies: Allergies  Allergen Reactions  . Doxycycline Swelling  . Sulfonamide Derivatives Other (See Comments)    Childhood allergy.    Vitals: Temp: 98.4 F (36.9 C) (06/05 1539) Temp Source: Oral (06/05 1539) BP: 145/107 (06/05 1539) Pulse Rate: 76 (06/05 1539)  Past Medical History: Past Medical History:  Diagnosis Date  . Abnormal Pap smear    Had Cryo to treat  . Allergy   . Anemia   . HIV infection [facility](HCC)     Social History: Social History   Socioeconomic History  . Marital status: Single    Spouse name: Not on file  . Number of children: Not on file  . Years of education: Not on file  . Highest education level: Not on file  Occupational History  . Not on file  Social Needs  . Financial resource strain: Not on file  . Food insecurity:    Worry: Not on file    Inability: Not on file  . Transportation needs:    Medical: Not on file    Non-medical: Not on file  Tobacco Use  . Smoking status: Current Every Day Smoker    Packs/day: 1.00    Years: 27.00    Pack years: 27.00    Types: Cigarettes  . Smokeless tobacco: Never Used  . Tobacco comment: patient not ready to quit; considering patches  Substance and Sexual Activity  . Alcohol use: Yes    Alcohol/week: 0.0 oz    Comment: pint   . Drug use: Yes    Frequency: 7.0 times per week    Types: Marijuana, Cocaine    Comment: not currently doing cocaine  . Sexual activity: Yes    Birth control/protection: Surgical, Condom    Comment: pt. declined condoms  Lifestyle  . Physical activity:    Days per week: Not on file    Minutes per session: Not on file  . Stress: Not on file  Relationships  . Social connections:    Talks on phone: Not on file    Gets together: Not on file    Attends religious service: Not on file    Active member of club or organization: Not on file    Attends meetings of clubs or  organizations: Not on file    Relationship status: Not on file  Other Topics Concern  . Not on file  Social History Narrative  . Not on file    Previous Regimen: Atripla>>Genvoya  Current Regimen: Off  Labs: HIV 1 RNA Quant (copies/mL)  Date Value  05/16/2017 109,000 (H)  07/20/2016 <20 NOT DETECTED  03/01/2016 <20   CD4 T Cell Abs (/uL)  Date Value  11/11/2017 180 (L)  05/16/2017 400  07/20/2016 670   Hep B S Ab (no units)  Date Value  07/07/2015 NEG   Hepatitis B Surface Ag (no units)  Date Value  08/08/2006 NO   HCV Ab (no units)  Date Value  08/08/2006 NO    CrCl: Estimated Creatinine Clearance: 88.6 mL/min (by C-G formula based on SCr of 0.76 mg/dL).  Lipids:    Component Value Date/Time   CHOL 213 (H) 07/20/2016 1107   TRIG 126 07/20/2016 1107   HDL 45 (L) 07/20/2016 1107   CHOLHDL 4.7 07/20/2016 1107   VLDL 25 07/20/2016 1107   LDLCALC 143 (H) 07/20/2016 1107    Assessment: Lori [name] has a hx  of mental disease. Atripla was changed to [facility] due to worsening of symptoms. She is off therapy now because she stated that the [facility] made her throw up after it dissolves. We have decided to change her to [place] today. She has a medicare part D and medicaid as back up. We will try to send the rx to [facility] to see if she has any copay. [facility] can mail it out if they can fill it. If not, we will forward it to the [facility] in [place].   She will f/u with pharmacy in 6 wks for labs. We will set up an appt with Dr. [name] at that time.   Recommendations:  Start Biktarvy 1 PO qday F/u with pharmacy in 6 wks  [name], PharmD, BCPS, AAHIVP, CPP Clinical Infectious Disease Pharmacist [facility] 11/16/2017, 4:34 PM

## 2017-11-18 NOTE — ED Provider Notes (Signed)
[facility] EMERGENCY DEPARTMENT Provider Note   CSN: [id number] Arrival date & time: 11/11/17  0744     History   Chief Complaint Chief Complaint  Patient presents with  . Cough  . Chest Pain    HPI Lori [name] is a 47 y.o. female.  Patient complains of a cough for many days.  No fever no chills.  Patient is HIV positive and has not been taking her medicine.  The history is provided by the patient. No language interpreter was used.  Cough  This is a new problem. The current episode started more than 2 days ago. The problem occurs constantly. The problem has not changed since onset.The cough is non-productive. There has been no fever. Associated symptoms include chest pain. Pertinent negatives include no headaches.  Chest Pain   Associated symptoms include cough. Pertinent negatives include no abdominal pain, no back pain and no headaches.  Pertinent negatives for past medical history include no seizures.    Past Medical History:  Diagnosis Date  . Abnormal Pap smear    Had Cryo to treat  . Allergy   . Anemia   . HIV infection [facility])     Patient Active Problem List   Diagnosis Date Noted  . Lung nodule 11/16/2017  . Cocaine abuse (HCC) 11/16/2017  . Syphilis 07/18/2017  . Pain management contract agreement 09/09/2016  . Headache 07/22/2014  . Essential hypertension 07/22/2014  . Back pain 12/24/2013  . Shingles 06/27/2013  . Multiple contusions 04/27/2012  . Costochondritis 04/01/2011  . 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 CELLULITIS AND ABSCESS OF OTHER SPECIFIED SITE 10/18/2007  . HAIR LOSS 10/18/2007  . SKIN RASH 10/18/2007  . ONYCHOMYCOSIS, BILATERAL 04/03/2007  . INSOMNIA 10/31/2006  . Migraine 10/31/2006  . Human immunodeficiency virus (HIV) disease (HCC) 06/27/2006  . ANXIETY DISORDER, GENERALIZED 06/27/2006  . TOBACCO ABUSE 06/27/2006  . Sinusitis, chronic 06/27/2006  . HYSTERECTOMY, TOTAL, HX OF 12/20/2005    Past Surgical History:    Procedure Laterality Date  . ABDOMINAL HYSTERECTOMY    . JOINT REPLACEMENT     left knee  . KNEE ARTHROSCOPY    . TUBAL LIGATION       OB History    Gravida  7   Para  2   Term  2   Preterm      AB  5   Living  2     SAB  2   TAB  3   Ectopic      Multiple      Live Births               Home Medications    Prior to Admission medications   Medication Sig Start Date End Date Taking? Authorizing Provider  acetaminophen (TYLENOL) 325 MG tablet Take 650 mg by mouth every 6 (six) hours as needed.    [provider]  bictegravir-emtricitabine-tenofovir AF (BIKTARVY) 50-200-25 MG TABS tablet Take 1 tablet by mouth daily. 11/16/17   [name], MD  diphenhydramine-acetaminophen (TYLENOL PM) 25-500 MG TABS tablet Take 1 tablet by mouth at bedtime as needed (sleep).    [provider]  ibuprofen (ADVIL,MOTRIN) 800 MG tablet TAKE ONE TABLET BY MOUTH AS NEEDED FOR PAIN Patient not taking: Reported on 05/16/2017 08/09/16   [name], MD  levofloxacin (LEVAQUIN) 500 MG tablet Take 1 tablet (500 mg total) by mouth daily. 11/11/17   Bethann Berkshire, MD    Family History Family History  Problem Relation  Age of Onset  . Cancer Mother        uterine and cervical  . CAD Mother        MI 2016  . Cancer Maternal Grandmother   . Cancer Maternal Grandfather   . Cancer Paternal Grandfather     Social History Social History   Tobacco Use  . Smoking status: Current Every Day Smoker    Packs/day: 1.00    Years: 27.00    Pack years: 27.00    Types: Cigarettes  . Smokeless tobacco: Never Used  . Tobacco comment: patient not ready to quit; considering patches  Substance Use Topics  . Alcohol use: Yes    Alcohol/week: 0.0 oz    Comment: pint   . Drug use: Yes    Frequency: 7.0 times per week    Types: Marijuana, Cocaine    Comment: not currently doing cocaine     Allergies   Doxycycline and Sulfonamide derivatives   Review of  Systems Review of Systems  Constitutional: Negative for appetite change and fatigue.  HENT: Negative for congestion, ear discharge and sinus pressure.   Eyes: Negative for discharge.  Respiratory: Positive for cough.   Cardiovascular: Positive for chest pain.  Gastrointestinal: Negative for abdominal pain and diarrhea.  Genitourinary: Negative for frequency and hematuria.  Musculoskeletal: Negative for back pain.  Skin: Negative for rash.  Neurological: Negative for seizures and headaches.  Psychiatric/Behavioral: Negative for hallucinations.     Physical Exam Updated Vital Signs BP (!) 151/93   Pulse 63   Temp 98.6 F (37 C) (Oral)   Resp (!) 25   Wt 83.9 kg (185 lb)   SpO2 96%   BMI 31.76 kg/m   Physical Exam  Constitutional: She is oriented to person, place, and time. She appears well-developed.  HENT:  Head: Normocephalic.  Eyes: Conjunctivae and EOM are normal. No scleral icterus.  Neck: Neck supple. No thyromegaly present.  Cardiovascular: Normal rate and regular rhythm. Exam reveals no gallop and no friction rub.  No murmur heard. Pulmonary/Chest: No stridor. She has no wheezes. She has no rales. She exhibits no tenderness.  Abdominal: She exhibits no distension. There is no tenderness. There is no rebound.  Musculoskeletal: Normal range of motion. She exhibits no edema.  Lymphadenopathy:    She has no cervical adenopathy.  Neurological: She is oriented to person, place, and time. She exhibits normal muscle tone. Coordination normal.  Skin: No rash noted. No erythema.  Psychiatric: She has a normal mood and affect. Her behavior is normal.     ED Treatments / Results  Labs (all labs ordered are listed, but only abnormal results are displayed) Labs Reviewed  BASIC METABOLIC PANEL - Abnormal; Notable for the following components:      Result Value   Glucose, Bld 101 (*)    All other components within normal limits  T-HELPER CELLS (CD4) COUNT (NOT AT [facility]) -  Abnormal; Notable for the following components:   CD4 T Cell Abs 180 (*)    CD4 % Helper T Cell 19 (*)    All other components within normal limits  HEPATIC FUNCTION PANEL - Abnormal; Notable for the following components:   Bilirubin, Direct <0.1 (*)    All other components within normal limits  DIFFERENTIAL - Abnormal; Notable for the following components:   Neutro Abs 7.9 (*)    All other components within normal limits  CBC  TROPONIN I    EKG EKG Interpretation  Date/Time:  Friday  Nov 11 2017 08:09:18 EDT Ventricular Rate:  57 PR Interval:    QRS Duration: 88 QT Interval:  446 QTC Calculation: 435 R Axis:   69 Text Interpretation:  Sinus rhythm Borderline T abnormalities, lateral leads Confirmed by Bethann Berkshire [phone]) on 11/11/2017 9:23:51 AM   Radiology No results found.  Procedures Procedures (including critical care time)  Medications Ordered in ED Medications - No data to display   Initial Impression / Assessment and Plan / ED Course  I have reviewed the triage vital signs and the nursing notes.  Pertinent labs & imaging results that were available during my care of the patient were reviewed by me and considered in my medical decision making (see chart for details).    Labs unremarkable.  CD4 count not available at discharge.  Chest x-ray shows may be bronchitis changes.  I called infectious disease but the patient did not want to stay around any longer.  She was discharged with Levaquin.  Infectious disease did call after the patient was discharged and agreed with the treatment and will see the patient soon  Final Clinical Impressions(s) / ED Diagnoses   Final diagnoses:  Cough    ED Discharge Orders        Ordered    levofloxacin (LEVAQUIN) 500 MG tablet  Daily     11/11/17 1041       Bethann Berkshire, MD 11/18/17 1321

## 2017-11-21 ENCOUNTER — Other Ambulatory Visit: Payer: Medicare Other

## 2017-11-21 ENCOUNTER — Ambulatory Visit: Payer: Medicare Other | Admitting: Infectious Diseases

## 2017-12-05 ENCOUNTER — Ambulatory Visit: Payer: Medicare Other | Admitting: Infectious Diseases

## 2017-12-09 ENCOUNTER — Telehealth: Payer: Self-pay

## 2017-12-09 NOTE — Telephone Encounter (Signed)
PT called today to f/u on referral for Chest US that was ordered during last visit. Was able to inform pt that we are still working on the referral to get scheduled. Will route a message to MD and Morrie SheldonAshley to check status on referral.  Lorenso CourierJose L Irving Lubbers, CMA

## 2017-12-18 MED FILL — BIKTARVY 50-200-25 MG TABS: 50-200-25 | 30 days supply | Qty: 30 | Fill #1

## 2017-12-27 ENCOUNTER — Ambulatory Visit: Payer: Medicare Other

## 2017-12-28 ENCOUNTER — Ambulatory Visit: Payer: Medicare Other

## 2018-01-03 ENCOUNTER — Ambulatory Visit: Payer: Medicare Other

## 2018-01-16 ENCOUNTER — Ambulatory Visit: Payer: Medicare Other

## 2018-01-19 MED FILL — BIKTARVY 50-200-25 MG TABS: 50-200-25 | 30 days supply | Qty: 30 | Fill #2

## 2018-01-23 ENCOUNTER — Telehealth: Payer: Self-pay | Admitting: *Deleted

## 2018-01-23 DIAGNOSIS — H3500 Unspecified background retinopathy: Secondary | ICD-10-CM | POA: Diagnosis not present

## 2018-01-23 NOTE — Telephone Encounter (Signed)
Lori [name] called to let Dr Ninetta LightsHatcher know she will be incarcerated for the next 4 months in [place]. SHe has made arrangements with [name] to mail medication to her home. Her mother will bring it to her at the facility. She is going to ask to be held separately from general population, due to her immune system status (last CD4 <200).  She will let the medical office know we are her provider so they can set up a visit during her incarceration. Andree CossHowell, Bertin Inabinet M, RN

## 2018-01-24 ENCOUNTER — Ambulatory Visit (INDEPENDENT_AMBULATORY_CARE_PROVIDER_SITE_OTHER): Payer: Medicare Other | Admitting: Pharmacist

## 2018-01-24 DIAGNOSIS — B20 Human immunodeficiency virus [HIV] disease: Secondary | ICD-10-CM | POA: Diagnosis not present

## 2018-01-24 NOTE — Telephone Encounter (Signed)
Patient called to inform office she will not be incarcerated as previously thought due to some issues with nursing at the facility. Patient stated she will be coming to see pharmacy today and try to have labs done same day. Will schedule an appointment to see Dr. Ninetta LightsHatcher. Patient will call our office if anything changes. Lorenso CourierJose L Matilda Fleig, [place]

## 2018-01-24 NOTE — Progress Notes (Signed)
HPI: Lori [name] is a 47 y.o. female who presents to the [facility] pharmacy clinic for HIV follow-up.  Patient Active Problem List   Diagnosis Date Noted  . Lung nodule 11/16/2017  . Cocaine abuse (HCC) 11/16/2017  . Syphilis 07/18/2017  . Pain management contract agreement 09/09/2016  . Headache 07/22/2014  . Essential hypertension 07/22/2014  . Back pain 12/24/2013  . Shingles 06/27/2013  . Multiple contusions 04/27/2012  . Costochondritis 04/01/2011  . 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 CELLULITIS AND ABSCESS OF OTHER SPECIFIED SITE 10/18/2007  . HAIR LOSS 10/18/2007  . SKIN RASH 10/18/2007  . ONYCHOMYCOSIS, BILATERAL 04/03/2007  . INSOMNIA 10/31/2006  . Migraine 10/31/2006  . Human immunodeficiency virus (HIV) disease (HCC) 06/27/2006  . ANXIETY DISORDER, GENERALIZED 06/27/2006  . TOBACCO ABUSE 06/27/2006  . Sinusitis, chronic 06/27/2006  . HYSTERECTOMY, TOTAL, HX OF 12/20/2005    Patient's Medications  New Prescriptions   No medications on file  Previous Medications   ACETAMINOPHEN (TYLENOL) 325 MG TABLET    Take 650 mg by mouth every 6 (six) hours as needed.   BICTEGRAVIR-EMTRICITABINE-TENOFOVIR AF (BIKTARVY) 50-200-25 MG TABS TABLET    Take 1 tablet by mouth daily.   DIPHENHYDRAMINE-ACETAMINOPHEN (TYLENOL PM) 25-500 MG TABS TABLET    Take 1 tablet by mouth at bedtime as needed (sleep).   IBUPROFEN (ADVIL,MOTRIN) 800 MG TABLET    TAKE ONE TABLET BY MOUTH AS NEEDED FOR PAIN   LEVOFLOXACIN (LEVAQUIN) 500 MG TABLET    Take 1 tablet (500 mg total) by mouth daily.  Modified Medications   No medications on file  Discontinued Medications   No medications on file    Allergies: Allergies  Allergen Reactions  . Doxycycline Swelling  . Sulfonamide Derivatives Other (See Comments)    Childhood allergy.    Past Medical History: Past Medical History:  Diagnosis Date  . Abnormal Pap smear    Had Cryo to treat  . Allergy   . Anemia   . HIV infection [facility](HCC)      Social History: Social History   Socioeconomic History  . Marital status: Single    Spouse name: Not on file  . Number of children: Not on file  . Years of education: Not on file  . Highest education level: Not on file  Occupational History  . Not on file  Social Needs  . Financial resource strain: Not on file  . Food insecurity:    Worry: Not on file    Inability: Not on file  . Transportation needs:    Medical: Not on file    Non-medical: Not on file  Tobacco Use  . Smoking status: Current Every Day Smoker    Packs/day: 1.00    Years: 27.00    Pack years: 27.00    Types: Cigarettes  . Smokeless tobacco: Never Used  . Tobacco comment: patient not ready to quit; considering patches  Substance and Sexual Activity  . Alcohol use: Yes    Alcohol/week: 0.0 standard drinks    Comment: pint   . Drug use: Yes    Frequency: 7.0 times per week    Types: Marijuana, Cocaine    Comment: not currently doing cocaine  . Sexual activity: Yes    Birth control/protection: Surgical, Condom    Comment: pt. declined condoms  Lifestyle  . Physical activity:    Days per week: Not on file    Minutes per session: Not on file  . Stress: Not on file  Relationships  .  Social connections:    Talks on phone: Not on file    Gets together: Not on file    Attends religious service: Not on file    Active member of club or organization: Not on file    Attends meetings of clubs or organizations: Not on file    Relationship status: Not on file  Other Topics Concern  . Not on file  Social History Narrative  . Not on file    Labs: Lab Results  Component Value Date   HIV1RNAQUANT 109,000 (H) 05/16/2017   HIV1RNAQUANT <20 NOT DETECTED 07/20/2016   HIV1RNAQUANT <20 03/01/2016   CD4TABS 180 (L) 11/11/2017   CD4TABS 400 05/16/2017   CD4TABS 670 07/20/2016    RPR and STI Lab Results  Component Value Date   LABRPR REACTIVE (A) 05/16/2017   LABRPR NON REAC 07/20/2016   LABRPR NON REAC  03/01/2016   LABRPR NON REAC 07/07/2015   LABRPR NON REAC 12/10/2013   RPRTITER 1:128 (H) 05/16/2017    STI Results GC CT  05/16/2017 Negative Negative  07/20/2016 Negative Negative  03/01/2016 Negative Negative    Hepatitis B Lab Results  Component Value Date   HEPBSAB NEG 07/07/2015   HEPBSAG NO 08/08/2006   Hepatitis C Lab Results  Component Value Date   HCVRNAPCRQN <15 05/16/2017   Hepatitis A Lab Results  Component Value Date   HAV NEG 04/28/2009   Lipids: Lab Results  Component Value Date   CHOL 213 (H) 07/20/2016   TRIG 126 07/20/2016   HDL 45 (L) 07/20/2016   CHOLHDL 4.7 07/20/2016   VLDL 25 07/20/2016   LDLCALC 143 (H) 07/20/2016    Current HIV Regimen: Biktarvy  Assessment: Lori [name] is here today to follow-up for her HIV infection.  She was seen by Dr. [name] in June where she was having trouble tolerating Genvoya.  She would throw up the medication after taking it.  She also states that she was taking oxycodone as well (inappropriately) and when she mixed it with Genvoya, it would make her vomit.  She was switched to [facility] and counseled on how to take it properly.  She states today that she really likes Biktarvy and has had little to no side effects.  She is having some headaches but they are minimal.  No vomiting or issues with her stomach.  She is working through some issues right now.  She was going to turn herself into jail tomorrow but since her CD4 count is so low (180) she does not want to be put into general population where she could get sick.  She apparently had the option to do 4 months of jail or 18 months of probation and has decided to do probation.  She is still depressed but not nearly as bad as she has been before.  No suicidal thoughts. She isn't seeing a counselor and doesn't want to see ours as she lives far away and states it is too expensive for gas to come all the time. I told her to let us know if things change.    I will recheck her  HIV viral load and CD4 count today.  She states she has missed maybe 1-2 doses since starting the Biktarvy.  No issues getting it at [facility]. She wishes to see Dr. [name] in the next few weeks, so I will set up an appointment for her.  Plan: - Continue Biktarvy PO once daily - HIV VL and CD4 today - F/u with Dr. [name] 9/5  145pm  Cassie L. Kuppelweiser, PharmD, AAHIVP, CPP Infectious Diseases Clinical Pharmacist [facility] 01/24/2018, 4:18 PM

## 2018-01-26 LAB — HIV-1 RNA QUANT-NO REFLEX-BLD
HIV 1 RNA Quant: <20 copies/mL
HIV-1 RNA Quant, Log: <1.3 Log copies/mL

## 2018-01-26 LAB — T-HELPER CELL (CD4) - (RCID CLINIC ONLY)
CD4 % Helper T Cell: 26 % — ABNORMAL LOW (ref 33–55)
CD4 T CELL ABS: 740 /uL (ref 400–2700)

## 2018-01-27 ENCOUNTER — Telehealth: Payer: Self-pay | Admitting: Pharmacist

## 2018-01-27 NOTE — Telephone Encounter (Signed)
Called Lori [name] [name] to let her know that her HIV viral load was already back down to undetectable and her CD4 count was up to 740. Congratulated her and told her to keep up the good work.

## 2018-02-15 MED FILL — BIKTARVY 50-200-25 MG TABS: 50-200-25 | 30 days supply | Qty: 30 | Fill #3

## 2018-02-16 ENCOUNTER — Encounter: Payer: Self-pay | Admitting: Infectious Diseases

## 2018-02-16 ENCOUNTER — Ambulatory Visit (INDEPENDENT_AMBULATORY_CARE_PROVIDER_SITE_OTHER): Payer: Medicare Other | Admitting: Infectious Diseases

## 2018-02-16 VITALS — BP 125/85 | HR 69 | Temp 98.2°F | Wt 174.1 lb

## 2018-02-16 DIAGNOSIS — F1410 Cocaine abuse, uncomplicated: Secondary | ICD-10-CM | POA: Diagnosis not present

## 2018-02-16 DIAGNOSIS — B20 Human immunodeficiency virus [HIV] disease: Secondary | ICD-10-CM | POA: Diagnosis not present

## 2018-02-16 DIAGNOSIS — Z113 Encounter for screening for infections with a predominantly sexual mode of transmission: Secondary | ICD-10-CM | POA: Diagnosis not present

## 2018-02-16 DIAGNOSIS — G4700 Insomnia, unspecified: Secondary | ICD-10-CM | POA: Diagnosis not present

## 2018-02-16 DIAGNOSIS — Z23 Encounter for immunization: Secondary | ICD-10-CM

## 2018-02-16 DIAGNOSIS — Z79899 Other long term (current) drug therapy: Secondary | ICD-10-CM | POA: Diagnosis not present

## 2018-02-16 MED ORDER — TEMAZEPAM 15 MG PO CAPS: 15.0000 mg | ORAL_CAPSULE | Freq: Every evening | ORAL | 0 refills | Status: DC | PRN

## 2018-02-16 NOTE — Progress Notes (Signed)
   Subjective:    Patient ID: Lori [name], female    DOB: 12/11/1970, 47 y.o.   MRN: [id number]  HPI 47yo F with HIV+, anxiety d/o, anal/vaginal warts.  Previously on atripla (made her depressed suicidal). tried odefsy but couldn't tolerate. Then changed to genvoya which she went off of . States it makes her throw up. At her f/u 11-2017, she was changed to biktarvy.  Had PAP 09-2012 NL, 06-24-14 NL.  Previous hysterectomy.  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 Has gotten multiple scratches.   Continues to smoke   HIV 1 RNA Quant (copies/mL)  Date Value  01/24/2018 <20 NOT DETECTED  05/16/2017 109,000 (H)  07/20/2016 <20 NOT DETECTED   CD4 T Cell Abs (/uL)  Date Value  01/24/2018 740  11/11/2017 180 (L)  05/16/2017 400     Review of Systems  Constitutional: Negative for appetite change and unexpected weight change.  Gastrointestinal: Negative for constipation and diarrhea.  Genitourinary: Negative for difficulty urinating.  Skin: Positive for wound.  Neurological: Negative for headaches.  Psychiatric/Behavioral: Positive for sleep disturbance. The patient is nervous/anxious.   stress. Wants something for "nerves".  Has taken ambien before and did not like. Was taking tylenol pm.  Please see HPI. All other systems reviewed and negative.      Objective:   Physical Exam  Constitutional: She is oriented to person, place, and time. She appears well-developed and well-nourished.  HENT:  Mouth/Throat: No oropharyngeal exudate.  Eyes: Pupils are equal, round, and reactive to light. EOM are normal.  Neck: Normal range of motion. Neck supple.  Cardiovascular: Normal rate, regular rhythm and normal heart sounds.  Pulmonary/Chest: Effort normal and breath sounds normal.  Abdominal: Soft. Bowel sounds are normal. She exhibits no distension. There is no tenderness.  Musculoskeletal: Normal range of motion.    Lymphadenopathy:    She has no cervical adenopathy.  Neurological: She is alert and oriented to person, place, and time.  Psychiatric: Her speech is rapid and/or pressured.      Assessment & Plan:

## 2018-02-16 NOTE — Assessment & Plan Note (Signed)
Will giver her trial of restoril.

## 2018-02-16 NOTE — Assessment & Plan Note (Signed)
Is staying clean.  Encouraged pt.

## 2018-02-16 NOTE — Assessment & Plan Note (Signed)
She is doing very well on biktarvy Given condoms Flu today Has not had PAP in 4-5 years. She will get (states she does not have cervix or uterus).  Will also try to get mammo rtc in 9 months.

## 2018-02-20 ENCOUNTER — Other Ambulatory Visit: Payer: Self-pay | Admitting: Infectious Diseases

## 2018-02-20 DIAGNOSIS — Z1231 Encounter for screening mammogram for malignant neoplasm of breast: Secondary | ICD-10-CM

## 2018-03-07 ENCOUNTER — Telehealth: Payer: Self-pay | Admitting: *Deleted

## 2018-03-07 NOTE — Telephone Encounter (Signed)
Patient called to advise she is having sore throat, and front facial headache. She wants an antibiotic called in to her pharmacy. Advised she will need to be seen as it could be anything and we do not do antibiotics for everything. She advised she wants to get in front of this and see a provider sooner rather than later. Offered her an office visit with Judeth CornfieldStephanie NP for Thursday 03/09/18 as she is not working that day.

## 2018-03-09 ENCOUNTER — Encounter: Payer: Self-pay | Admitting: Infectious Diseases

## 2018-03-09 ENCOUNTER — Ambulatory Visit (INDEPENDENT_AMBULATORY_CARE_PROVIDER_SITE_OTHER): Payer: Medicare Other | Admitting: Infectious Diseases

## 2018-03-09 VITALS — BP 135/89 | HR 66 | Temp 98.1°F | Wt 195.0 lb

## 2018-03-09 DIAGNOSIS — R911 Solitary pulmonary nodule: Secondary | ICD-10-CM | POA: Diagnosis not present

## 2018-03-09 DIAGNOSIS — J0100 Acute maxillary sinusitis, unspecified: Secondary | ICD-10-CM

## 2018-03-09 DIAGNOSIS — B20 Human immunodeficiency virus [HIV] disease: Secondary | ICD-10-CM

## 2018-03-09 DIAGNOSIS — Z90710 Acquired absence of both cervix and uterus: Secondary | ICD-10-CM | POA: Diagnosis not present

## 2018-03-09 DIAGNOSIS — F411 Generalized anxiety disorder: Secondary | ICD-10-CM | POA: Diagnosis not present

## 2018-03-09 DIAGNOSIS — Z0000 Encounter for general adult medical examination without abnormal findings: Secondary | ICD-10-CM | POA: Diagnosis not present

## 2018-03-09 MED ORDER — HYDROXYZINE HCL 50 MG PO TABS: 50.0000 mg | ORAL_TABLET | Freq: Three times a day (TID) | ORAL | 0 refills | Status: DC | PRN

## 2018-03-09 MED ORDER — FLUTICASONE PROPIONATE 50 MCG/ACT NA SUSP: 2.0000 | Freq: Every day | NASAL | 2 refills | Status: DC

## 2018-03-09 MED ORDER — AMOXICILLIN-POT CLAVULANATE 875-125 MG PO TABS: 1.0000 | ORAL_TABLET | Freq: Two times a day (BID) | ORAL | 0 refills | Status: AC

## 2018-03-09 NOTE — Progress Notes (Signed)
Name: Lori [name]  DOB: 1970/10/07 MRN: [id number] PCP: Patient, No Pcp Per   Chief Complaint  Patient presents with  . Sinus Problem    c/o aches and sinus drainage for a week     Patient Active Problem List   Diagnosis Date Noted  . Healthcare maintenance 03/09/2018  . Lung nodule 11/16/2017  . Cocaine abuse (HCC) 11/16/2017  . Syphilis 07/18/2017  . Pain management contract agreement 09/09/2016  . Essential hypertension 07/22/2014  . Back pain 12/24/2013  . Shingles 06/27/2013  . Multiple contusions 04/27/2012  . 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 SKIN RASH 10/18/2007  . ONYCHOMYCOSIS, BILATERAL 04/03/2007  . INSOMNIA 10/31/2006  . Migraine 10/31/2006  . Human immunodeficiency virus (HIV) disease (HCC) 06/27/2006  . ANXIETY DISORDER, GENERALIZED 06/27/2006  . TOBACCO ABUSE 06/27/2006  . Acute sinusitis 06/27/2006  . H/O total hysterectomy 12/20/2005     Subjective:   Chief Complaint  Patient presents with  . Sinus Problem    c/o aches and sinus drainage for a week    Lori [name] is here today for evaluation of URI symptoms. She was seen in the ED in May of this year with cough for a few days, no fevers or chills and dx with bronchitis - given levaquin and seen in the office a few days later with improvement. She has a nasty taste in her mouth that has been ongoing for at least a week. Today she has some associated sore throat, achy joints. She has left sinus pain with some teeth pain as well. No fevers, headaches but she does get hot flashes (normal for her). She does not usually get these types of infections. She recently started a job at an [employer]rest[employer].  She is a current everyday smoker.   She is scheduled for screening mammogram in November. Last pap was in 2014 and since then she has had a hysterectomy.   Review of Systems  All other systems reviewed and are negative.   Past Medical History:  Diagnosis Date  . Abnormal Pap smear    Had  Cryo to treat  . Allergy   . Anemia   . HIV infection [facility])     Outpatient Medications Prior to Visit  Medication Sig Dispense Refill  . acetaminophen (TYLENOL) 325 MG tablet Take 650 mg by mouth every 6 (six) hours as needed.    . bictegravir-emtricitabine-tenofovir AF (BIKTARVY) 50-200-25 MG TABS tablet Take 1 tablet by mouth daily. 90 tablet 3  . diphenhydramine-acetaminophen (TYLENOL PM) 25-500 MG TABS tablet Take 1 tablet by mouth at bedtime as needed (sleep).    Marland Kitchen ibuprofen (ADVIL,MOTRIN) 800 MG tablet TAKE ONE TABLET BY MOUTH AS NEEDED FOR PAIN 60 tablet 2  . temazepam (RESTORIL) 15 MG capsule Take 1 capsule (15 mg total) by mouth at bedtime as needed for sleep. 30 capsule 0   Facility-Administered Medications Prior to Visit  Medication Dose Route Frequency Provider Last Rate Last Dose  . penicillin g benzathine (BICILLIN LA) 1200000 UNIT/2ML injection 2.4 Million Units  2.4 Million Units Intramuscular Once [name], MD         Allergies  Allergen Reactions  . Doxycycline Swelling  . Sulfonamide Derivatives Other (See Comments)    Childhood allergy.    Social History   Tobacco Use  . Smoking status: Current Every Day Smoker    Packs/day: 1.00    Years: 27.00    Pack years: 27.00    Types: Cigarettes  .  Smokeless tobacco: Never Used  . Tobacco comment: patient not ready to quit; considering patches  Substance Use Topics  . Alcohol use: Yes    Alcohol/week: 0.0 standard drinks    Comment: pint   . Drug use: Yes    Frequency: 7.0 times per week    Types: Marijuana, Cocaine    Comment: not currently doing cocaine    Family History  Problem Relation Age of Onset  . Cancer Mother        uterine and cervical  . CAD Mother        MI 2016  . Cancer Maternal Grandmother   . Cancer Maternal Grandfather   . Cancer Paternal Grandfather     Social History   Substance and Sexual Activity  Sexual Activity Yes  . Birth control/protection: Surgical, Condom     Comment: pt. declined condoms     Objective:   Vitals:   03/09/18 0925  BP: 135/89  Pulse: 66  Temp: 98.1 F (36.7 C)  Weight: 195 lb (88.5 kg)   Body mass index is 33.47 kg/m.  Physical Exam  Constitutional: She is oriented to person, place, and time. She appears well-developed and well-nourished.  Seated comfortably in chair.   HENT:  Right Ear: Ear canal normal. A middle ear effusion is present.  Left Ear: Tympanic membrane and ear canal normal.  No middle ear effusion.  Nose: Mucosal edema and rhinorrhea present. Left sinus exhibits maxillary sinus tenderness.  Mouth/Throat: Mucous membranes are normal. No oral lesions. Normal dentition. No dental abscesses. No oropharyngeal exudate.  Cardiovascular: Normal rate, regular rhythm and normal heart sounds.  Pulmonary/Chest: Effort normal and breath sounds normal.  Abdominal: Soft. She exhibits no distension. There is no tenderness.  Lymphadenopathy:    She has no cervical adenopathy.  Neurological: She is alert and oriented to person, place, and time.  Skin: Skin is warm and dry. No rash noted.  Psychiatric: She has a normal mood and affect. Judgment normal.  In good spirits today and engaged in care discussion    Lab Results Lab Results  Component Value Date   WBC 9.7 11/11/2017   HGB 13.2 11/11/2017   HCT 38.2 11/11/2017   MCV 94.3 11/11/2017   PLT 219 11/11/2017    Lab Results  Component Value Date   CREATININE 0.76 11/11/2017   BUN 11 11/11/2017   NA 140 11/11/2017   K 3.9 11/11/2017   CL 104 11/11/2017   CO2 27 11/11/2017    Lab Results  Component Value Date   ALT 15 11/11/2017   AST 16 11/11/2017   ALKPHOS 67 11/11/2017   BILITOT 0.7 11/11/2017    Lab Results  Component Value Date   CHOL 213 (H) 07/20/2016   HDL 45 (L) 07/20/2016   LDLCALC 143 (H) 07/20/2016   TRIG 126 07/20/2016   CHOLHDL 4.7 07/20/2016   HIV 1 RNA Quant (copies/mL)  Date Value  01/24/2018 <20 NOT DETECTED  05/16/2017  109,000 (H)  07/20/2016 <20 NOT DETECTED   CD4 T Cell Abs (/uL)  Date Value  01/24/2018 740  11/11/2017 180 (L)  05/16/2017 400     Assessment & Plan:   Problem List Items Addressed This Visit      Unprioritized   Acute sinusitis    She has not had sinus infections in a while since cessation of intranasal drug use. 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 We  discussed using nasal decongestants, antihistamines, nasal rinses and flonase over the weekend. Will give rx for augmentin should she experience no improvement by Monday of next week to treat for presumed secondary bacterial component.   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       Relevant Medications   fluticasone (FLONASE) 50 MCG/ACT nasal spray   amoxicillin-clavulanate (AUGMENTIN) 875-125 MG tablet   ANXIETY DISORDER, GENERALIZED    She is taking a fair amount of benadryl to calm herself down. Will trial hydroxyzine to treat both anxiety as needed and allergies to minimize sedation effect.       Relevant Medications   hydrOXYzine (ATARAX/VISTARIL) 50 MG tablet   H/O total hysterectomy   Healthcare maintenance    Has already received flu shot. Mammogram scheduled in November.  Following hysterectomy for benign disease, routine screening for vaginal cancer is not recommended for women who are HIV (+); however women with a h/o high-grade CIN, adenocarcenoma in situ or other invasive cervical cancer are at increased risk and should be followed with annual vaginal cuff pap tests - She does have a history of abnormal pap smear treated with cryotherapy, LEEP and hysterectomy (2008) - therefore she should continue to undergo vaginal pap/inspections - previously followed at [facility] - offered to have her back for testing here in the future at her next appointment.        Human  immunodeficiency virus (HIV) disease (HCC) - Primary    Continue Biktarvy and follow up with Dr. [name] as scheduled.       Lung nodule    She did not schedule CT scan to evaluate - discussed today and she will schedule with our team in November to correspond with her mammogram to help with transportation.          Rexene Alberts, MSN, NP-C [facility] Pager: [phone] Office: [phone]  03/09/18  12:26 PM

## 2018-03-09 NOTE — Assessment & Plan Note (Addendum)
Has already received flu shot. Mammogram scheduled in November.  Following hysterectomy for benign disease, routine screening for vaginal cancer is not recommended for women who are HIV (+); however women with a h/o high-grade CIN, adenocarcenoma in situ or other invasive cervical cancer are at increased risk and should be followed with annual vaginal cuff pap tests - She does have a history of abnormal pap smear treated with cryotherapy, LEEP and hysterectomy (2008) - therefore she should continue to undergo vaginal pap/inspections - previously followed at [facility] - offered to have her back for testing here in the future at her next appointment.

## 2018-03-09 NOTE — Patient Instructions (Addendum)
General Recommendations:    Please drink plenty of fluids.  Get plenty of rest   Sleep in humidified air  Use saline nasal sprays  Netti pot   Start using masks at work with the [employer]   OTC Medications:  Decongestants - helps relieve congestion   Flonase (generic fluticasone) or Nasacort (generic triamcinolone) - please make sure to use the "cross-over" technique at a 45 degree angle towards the opposite eye as opposed to straight up the nasal passageway.    Sudafed (generic pseudoephedrine - Note this is the one that is available behind the pharmacy counter); Products with phenylephrine (-PE) may also be used but is often not as effective as pseudoephedrine. (I like the 12 hour one).    Allergies - helps relieve runny nose, itchy eyes and sneezing   Claritin (generic loratidine), Allegra (fexofenidine), or Zyrtec (generic cyrterizine) for runny nose. These medications should not cause drowsiness.  Note - Benadryl (generic diphenhydramine) may be used however may cause drowsiness  Cough -   Delsym or Robitussin (generic dextromethorphan)  Expectorants - helps loosen mucus to ease removal   Mucinex (generic guaifenesin) as directed on the package.  Headaches / General Aches   Tylenol (generic acetaminophen) - DO NOT EXCEED 3 grams (3,000 mg) in a 24 hour time period  Advil/Motrin (generic ibuprofen)   Sore Throat -   Salt water gargle   Chloraseptic (generic benzocaine) spray or lozenges / Sucrets (generic dyclonine)    Sinusitis Sinusitis is redness, soreness, and inflammation of the paranasal sinuses. Paranasal sinuses are air pockets within the bones of your face (beneath the eyes, the middle of the forehead, or above the eyes). In healthy paranasal sinuses, mucus is able to drain out, and air is able to circulate through them by way of your nose. However, when your paranasal sinuses are inflamed, mucus and air can become trapped. This can allow  bacteria and other germs to grow and cause infection. Sinusitis can develop quickly and last only a short time (acute) or continue over a long period (chronic). Sinusitis that lasts for more than 12 weeks is considered chronic.  CAUSES  Causes of sinusitis include:  Allergies.  Structural abnormalities, such as displacement of the cartilage that separates your nostrils (deviated septum), which can decrease the air flow through your nose and sinuses and affect sinus drainage.  Functional abnormalities, such as when the small hairs (cilia) that line your sinuses and help remove mucus do not work properly or are not present. SIGNS AND SYMPTOMS  Symptoms of acute and chronic sinusitis are the same. The primary symptoms are pain and pressure around the affected sinuses. Other symptoms include:  Upper toothache.  Earache.  Headache.  Bad breath.  Decreased sense of smell and taste.  A cough, which worsens when you are lying flat.  Fatigue.  Fever.  Thick drainage from your nose, which often is green and may contain pus (purulent).  Swelling and warmth over the affected sinuses. DIAGNOSIS  Your health care provider will perform a physical exam. During the exam, your health care provider may:  Look in your nose for signs of abnormal growths in your nostrils (nasal polyps).  Tap over the affected sinus to check for signs of infection.  View the inside of your sinuses (endoscopy) using an imaging device that has a light attached (endoscope). If your health care provider suspects that you have chronic sinusitis, one or more of the following tests may be recommended:  Allergy tests.  Nasal culture. A sample of mucus is taken from your nose, sent to a lab, and screened for bacteria.  Nasal cytology. A sample of mucus is taken from your nose and examined by your health care provider to determine if your sinusitis is related to an allergy. TREATMENT  Most cases of acute sinusitis are  related to a viral infection and will resolve on their own within 10 days. Sometimes medicines are prescribed to help relieve symptoms (pain medicine, decongestants, nasal steroid sprays, or saline sprays).  However, for sinusitis related to a bacterial infection, your health care provider will prescribe antibiotic medicines. These are medicines that will help kill the bacteria causing the infection.  Rarely, sinusitis is caused by a fungal infection. In theses cases, your health care provider will prescribe antifungal medicine. For some cases of chronic sinusitis, surgery is needed. Generally, these are cases in which sinusitis recurs more than 3 times per year, despite other treatments. HOME CARE INSTRUCTIONS   Drink plenty of water. Water helps thin the mucus so your sinuses can drain more easily.  Use a humidifier.  Inhale steam 3 to 4 times a day (for example, sit in the bathroom with the shower running).  Apply a warm, moist washcloth to your face 3 to 4 times a day, or as directed by your health care provider.  Use saline nasal sprays to help moisten and clean your sinuses.  Take medicines only as directed by your health care provider.  If you were prescribed either an antibiotic or antifungal medicine, finish it all even if you start to feel better.  SEEK IMMEDIATE MEDICAL CARE IF:  You have increasing pain or severe headaches.  You have nausea, vomiting, or drowsiness.  You have swelling around your face.  You have vision problems.  You have a stiff neck.  You have difficulty breathing.  MAKE SURE YOU:   Understand these instructions.  Will watch your condition.  Will get help right away if you are not doing well or get worse. Document Released: 05/31/2005 Document Revised: 10/15/2013 Document Reviewed: 06/15/2011 [facility] Patient Information 2015 [place], [place]. This information is not intended to replace advice given to you by your health care provider. Make sure  you discuss any questions you have with your health care provider.

## 2018-03-09 NOTE — Assessment & Plan Note (Signed)
She has not had sinus infections in a while since cessation of intranasal drug use. 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 We discussed using nasal decongestants, antihistamines, nasal rinses and flonase over the weekend. Will give rx for augmentin should she experience no improvement by Monday of next week to treat for presumed secondary bacterial component.   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

## 2018-03-09 NOTE — Assessment & Plan Note (Addendum)
She did not schedule CT scan to evaluate - discussed today and she will schedule with our team in November to correspond with her mammogram to help with transportation.

## 2018-03-09 NOTE — Assessment & Plan Note (Signed)
She is taking a fair amount of benadryl to calm herself down. Will trial hydroxyzine to treat both anxiety as needed and allergies to minimize sedation effect.

## 2018-03-09 NOTE — Assessment & Plan Note (Addendum)
Continue Biktarvy and follow up with Dr. [name] as scheduled.

## 2018-03-21 MED FILL — BIKTARVY 50-200-25 MG TABS: 50-200-25 | 30 days supply | Qty: 30 | Fill #4

## 2018-04-17 ENCOUNTER — Ambulatory Visit (HOSPITAL_COMMUNITY)
Admission: RE | Admit: 2018-04-17 | Discharge: 2018-04-17 | Disposition: A | Discharge status: Home / Self Care | Payer: Medicare Other | Source: Ambulatory Visit | Attending: Infectious Diseases | Admitting: Infectious Diseases

## 2018-04-17 DIAGNOSIS — J432 Centrilobular emphysema: Secondary | ICD-10-CM | POA: Diagnosis not present

## 2018-04-17 DIAGNOSIS — I2510 Atherosclerotic heart disease of native coronary artery without angina pectoris: Secondary | ICD-10-CM | POA: Insufficient documentation

## 2018-04-17 DIAGNOSIS — R918 Other nonspecific abnormal finding of lung field: Secondary | ICD-10-CM | POA: Diagnosis not present

## 2018-04-17 DIAGNOSIS — J439 Emphysema, unspecified: Secondary | ICD-10-CM | POA: Diagnosis not present

## 2018-04-17 MED ORDER — IOHEXOL 300 MG/ML  SOLN
75.0000 mL | Freq: Once | INTRAMUSCULAR | Status: AC | PRN
  Administered 2018-04-17: 75 mL via INTRAVENOUS

## 2018-04-19 ENCOUNTER — Telehealth: Payer: Self-pay | Admitting: Behavioral Health

## 2018-04-19 NOTE — Telephone Encounter (Signed)
Patient called requesting results from recent CT scan.  Explained to patient Dr. [name] will be made aware and will call he back with results once Dr. [name] reviews. Angeline Slim RN

## 2018-04-20 MED FILL — BIKTARVY 50-200-25 MG TABS: 50-200-25 | 30 days supply | Qty: 30 | Fill #5

## 2018-04-20 NOTE — Telephone Encounter (Signed)
Consistent with COPD/emphysema.  No masses or nodules (no evidence of cancer) thanks

## 2018-04-20 NOTE — Telephone Encounter (Signed)
Patient called back, verified identity.  Informed her per Dr. [name] that her chest CT was consistent with COPD/Emphysema.  There were no nodules or masses and no evidence of Cancer.  Patient was appreciative and verbalizes understanding.  Patient states she continue to smoke. Angeline Slim RN

## 2018-04-20 NOTE — Telephone Encounter (Signed)
Called patient left voicemail to call the office back and left the call back number. Angeline Slim RN

## 2018-04-27 ENCOUNTER — Ambulatory Visit
Admission: RE | Admit: 2018-04-27 | Discharge: 2018-04-27 | Disposition: A | Discharge status: Home / Self Care | Payer: Medicare Other | Source: Ambulatory Visit | Attending: Infectious Diseases | Admitting: Infectious Diseases

## 2018-04-27 DIAGNOSIS — Z1231 Encounter for screening mammogram for malignant neoplasm of breast: Secondary | ICD-10-CM | POA: Diagnosis not present

## 2018-05-24 MED FILL — BIKTARVY 50-200-25 MG TABS: 50-200-25 | 30 days supply | Qty: 30 | Fill #6

## 2018-06-19 MED FILL — BIKTARVY 50-200-25 MG TABS: 50-200-25 | 30 days supply | Qty: 30 | Fill #7

## 2018-07-17 MED FILL — BIKTARVY 50-200-25 MG TABS: 50-200-25 | 30 days supply | Qty: 30 | Fill #8

## 2018-08-14 MED FILL — BIKTARVY 50-200-25 MG TABS: 50-200-25 | 30 days supply | Qty: 30 | Fill #9

## 2018-09-08 MED FILL — BIKTARVY 50-200-25 MG TABS: 50-200-25 | 30 days supply | Qty: 30 | Fill #10

## 2018-10-12 MED FILL — BIKTARVY 50-200-25 MG TABS: 50-200-25 | 30 days supply | Qty: 30 | Fill #11

## 2018-10-17 ENCOUNTER — Telehealth: Payer: Self-pay | Admitting: *Deleted

## 2018-10-17 NOTE — Telephone Encounter (Signed)
Patient describes "intestines coming out" of her anus.  She says that it is a small amount, it comes out when she uses the bathroom or even sneezes, and she is able to push it back in, but she is concerned about infection or this getting worse. She reports no pain, but says it feels unpleasant. No fevers. This started in February with a lot of diarrhea (feels Biktarvy gave her diarrhea, but it has gotten better, it is more just gas at this point). She has transportation, has Art gallery manager.  Please advise on a referral.  Andree Coss, RN

## 2018-10-18 ENCOUNTER — Other Ambulatory Visit: Payer: Self-pay | Admitting: Infectious Diseases

## 2018-10-18 DIAGNOSIS — K623 Rectal prolapse: Secondary | ICD-10-CM

## 2018-10-18 NOTE — Telephone Encounter (Signed)
Needs GI referral for prolapse/endoscopy

## 2018-10-18 NOTE — Telephone Encounter (Signed)
Lori [name] and [facility] have reached out to the paitent.

## 2018-10-18 NOTE — Progress Notes (Signed)
GI eval for prolapse vs hemmeroids

## 2018-10-23 ENCOUNTER — Encounter: Payer: Self-pay | Admitting: *Deleted

## 2018-10-24 ENCOUNTER — Ambulatory Visit (INDEPENDENT_AMBULATORY_CARE_PROVIDER_SITE_OTHER): Payer: Medicare Other | Admitting: Internal Medicine

## 2018-10-24 ENCOUNTER — Telehealth: Payer: Self-pay | Admitting: *Deleted

## 2018-10-24 ENCOUNTER — Encounter: Payer: Self-pay | Admitting: Internal Medicine

## 2018-10-24 ENCOUNTER — Other Ambulatory Visit: Payer: Self-pay

## 2018-10-24 VITALS — Ht 64.0 in | Wt 220.0 lb

## 2018-10-24 DIAGNOSIS — B20 Human immunodeficiency virus [HIV] disease: Secondary | ICD-10-CM

## 2018-10-24 DIAGNOSIS — K921 Melena: Secondary | ICD-10-CM

## 2018-10-24 DIAGNOSIS — K623 Rectal prolapse: Secondary | ICD-10-CM | POA: Diagnosis not present

## 2018-10-24 NOTE — Telephone Encounter (Signed)
I have left a voicemail for patient to call back. She needs to schedule colonoscopy in [facility] as recommended by Dr Rhea Belton at her recent virtual visit.

## 2018-10-24 NOTE — Patient Instructions (Addendum)
Please purchase the following medications over the counter and take as directed: Colace 100 to 200 mg daily if stools are firm or hard to avoid straining  Please call to schedule colonoscopy at [phone].

## 2018-10-24 NOTE — Progress Notes (Signed)
Patient ID: Lori [name], female   DOB: 04/02/1971, 48 y.o.   MRN: [id number]  This service was provided via telemedicine.  Doximity App A/V communication, after 5 minutes change to telephone due to slow connections speed at her location The patient was located at home The provider was located in provider's GI office. The patient did consent to this telephone visit and is aware of possible charges through their insurance for this visit.   The persons participating in this telemedicine service were the patient and I. Time spent on call: 16 min  HPI: Lori [name] is a 48 year old female with a history of HIV, vaginal and anal warts, anxiety, tobacco use who is seen in consultation at the request of Dr. [name] to evaluate "rectal prolapse".  She is seen virtually today in the setting of COVID-19 pandemic.  She reports that she has been having issues with "my intestines hanging out".  She has noticed a bulging from the anus associated with straining, defecation and coughing.  Symptoms do come and go but have been worse over the last few months.  She notes that she is having increase of mucus and trace amounts of blood after bowel movement but also throughout the day.  She does not have to self reduce the prolapsing tissue she reports that will "withdraw" if she performs a Kegel maneuver.  She reports her bowel habits as generally irregular and that stools are generally firm.  However on Sunday she had 2 episodes of loose stool with lower abdominal crampy gas pain.  No bowel movement Monday or today.  She denies upper GI and hepatobiliary complaint.  Appetite is been good and during the pandemic she has been eating more.  She estimates in the last 6 months she has probably gained 50 pounds.  No shortness of breath or chest pain.  She has had a cough since February which is nonproductive.  No fevers.  She reports a history of anal and vaginal warts but does not feel any wart or see any warts around  the anal opening she reports these are more near the vaginal orifice.  No prior colonoscopy.  Denies a family history of colon cancer.    She has been taking her Biktarvy and reports it is been working well for her.  She reports when labs were last checked in January she had an undetectable viral load and a CD4 count of 750  Past Medical History:  Diagnosis Date  . Abnormal Pap smear    Had Cryo to treat  . Allergy   . Anemia   . Anxiety   . Cluster headaches   . HIV infection (HCC)   . Lung nodule   . Vaginal venereal warts     Past Surgical History:  Procedure Laterality Date  . ABDOMINAL HYSTERECTOMY    . KNEE ARTHROSCOPY Left    bakers cyst removal  . TUBAL LIGATION      Outpatient Medications Prior to Visit  Medication Sig Dispense Refill  . acetaminophen (TYLENOL) 325 MG tablet Take 650 mg by mouth every 6 (six) hours as needed.    . bictegravir-emtricitabine-tenofovir AF (BIKTARVY) 50-200-25 MG TABS tablet Take 1 tablet by mouth daily. 90 tablet 3  . diphenhydramine-acetaminophen (TYLENOL PM) 25-500 MG TABS tablet Take 1 tablet by mouth at bedtime as needed (sleep).     No facility-administered medications prior to visit.     Allergies  Allergen Reactions  . Doxycycline Swelling  . Sulfonamide Derivatives Other (See Comments)  Childhood allergy.    Family History  Problem Relation Age of Onset  . CAD Mother        MI 2016  . Uterine cancer Mother   . Cervical cancer Mother   . Lung cancer Maternal Grandmother   . Cancer Maternal Grandfather        larynx  . Prostate cancer Maternal Grandfather   . Prostate cancer Father   . Breast cancer Neg Hx   . Liver disease Neg Hx   . Esophageal cancer Neg Hx   . Stomach cancer Neg Hx     Social History   Tobacco Use  . Smoking status: Current Every Day Smoker    Packs/day: 1.00    Years: 27.00    Pack years: 27.00    Types: Cigarettes  . Smokeless tobacco: Never Used  . Tobacco comment: patient not  ready to quit; considering patches  Substance Use Topics  . Alcohol use: Yes    Alcohol/week: 0.0 standard drinks    Comment: pint   . Drug use: Yes    Frequency: 7.0 times per week    Types: Marijuana, Cocaine    Comment: not currently doing cocaine    ROS: As per history of present illness, otherwise negative  Ht 5\' 4"  (1.626 m)   Wt 220 lb (99.8 kg)   BMI 37.76 kg/m    RELEVANT LABS AND IMAGING: CBC    Component Value Date/Time   WBC 9.7 11/11/2017 0835   RBC 4.05 11/11/2017 0835   HGB 13.2 11/11/2017 0835   HCT 38.2 11/11/2017 0835   PLT 219 11/11/2017 0835   MCV 94.3 11/11/2017 0835   MCH 32.6 11/11/2017 0835   MCHC 34.6 11/11/2017 0835   RDW 13.0 11/11/2017 0835   LYMPHSABS 1.2 11/11/2017 0835   MONOABS 0.5 11/11/2017 0835   EOSABS 0.1 11/11/2017 0835   BASOSABS 0.0 11/11/2017 0835    CMP     Component Value Date/Time   NA 140 11/11/2017 0835   K 3.9 11/11/2017 0835   CL 104 11/11/2017 0835   CO2 27 11/11/2017 0835   GLUCOSE 101 (H) 11/11/2017 0835   BUN 11 11/11/2017 0835   CREATININE 0.76 11/11/2017 0835   CREATININE 0.74 05/16/2017 1214   CALCIUM 9.5 11/11/2017 0835   PROT 7.9 11/11/2017 0836   ALBUMIN 4.3 11/11/2017 0836   AST 16 11/11/2017 0836   ALT 15 11/11/2017 0836   ALKPHOS 67 11/11/2017 0836   BILITOT 0.7 11/11/2017 0836   GFRNONAA >60 11/11/2017 0835   GFRNONAA 97 05/16/2017 1214   GFRAA >60 11/11/2017 0835   GFRAA 113 05/16/2017 1214    ASSESSMENT/PLAN: 48 year old female with a history of HIV, vaginal and anal warts, anxiety, tobacco use who is seen in consultation at the request of Dr. Ninetta LightsHatcher to evaluate "rectal prolapse".   1.  Rectal prolapse symptoms with blood and mucus --we discussed the differential which is rectal prolapse or prolapsing internal hemorrhoids.  Cannot exclude anal condyloma or even mass lesion.  I have recommended colonoscopy and thorough rectal examination to determine etiology of symptoms.  We discussed  the risk, benefits and alternatives and she is agreeable and wishes to proceed.  Treatment decision based on findings after examination/colonoscopy. --Add Colace 100 to 200 mg daily if stools are firm or hard to avoid straining --Colonoscopy in the [facility]  2.  HIV --on antiretroviral therapy followed by Dr. Ninetta LightsHatcher   ZO:[id number]:Hatcher, Lacretia LeighJeffrey C, Md [address] [place] [address]

## 2018-10-25 NOTE — Telephone Encounter (Signed)
I have spoken to patient and scheduled previsit and colonoscopy. She verbalizes understanding of times and dates of both appointments.

## 2018-11-01 ENCOUNTER — Other Ambulatory Visit: Payer: Self-pay | Admitting: Infectious Diseases

## 2018-11-01 ENCOUNTER — Ambulatory Visit: Payer: Medicare Other | Admitting: *Deleted

## 2018-11-01 ENCOUNTER — Other Ambulatory Visit: Payer: Self-pay

## 2018-11-01 VITALS — Ht 64.0 in | Wt 200.0 lb

## 2018-11-01 DIAGNOSIS — K921 Melena: Secondary | ICD-10-CM

## 2018-11-01 DIAGNOSIS — B20 Human immunodeficiency virus [HIV] disease: Secondary | ICD-10-CM

## 2018-11-01 DIAGNOSIS — K623 Rectal prolapse: Secondary | ICD-10-CM

## 2018-11-01 MED ORDER — NA SULFATE-K SULFATE-MG SULF 17.5-3.13-1.6 GM/177ML PO SOLN: ORAL | 0 refills | Status: DC

## 2018-11-01 NOTE — Progress Notes (Signed)
Patient denies any allergies to eggs or soy. Patient had post op nausea and vomitting with her hysterectomy. Patient denies any problems with anesthesia/sedation. Patient denies any oxygen use at home. Patient denies taking any diet/weight loss medications or blood thinners. EMMI education assisgned to patient on colonoscopy, this was explained and instructions given to patient.Pt mailed instruction packet to included paper to complete and mail back to [facility] with addressed and stamped envelope, Emmi video, copy of consent form to read and not return, and instructions.  PV completed over the phone with the patient. Pt encouraged to call with any questions or issues. Insurance verified w/pt.

## 2018-11-09 MED FILL — BIKTARVY 50-200-25 MG TABS: 50-200-25 | 30 days supply | Qty: 30 | Fill #0

## 2018-11-13 ENCOUNTER — Ambulatory Visit: Payer: Medicare Other | Admitting: Infectious Diseases

## 2018-11-13 ENCOUNTER — Other Ambulatory Visit: Payer: Self-pay

## 2018-11-13 ENCOUNTER — Other Ambulatory Visit (HOSPITAL_COMMUNITY)
Admission: RE | Admit: 2018-11-13 | Discharge: 2018-11-13 | Disposition: A | Discharge status: Home / Self Care | Payer: Medicare Other | Source: Ambulatory Visit | Attending: Infectious Diseases | Admitting: Infectious Diseases

## 2018-11-13 ENCOUNTER — Encounter: Payer: Self-pay | Admitting: Infectious Diseases

## 2018-11-13 ENCOUNTER — Other Ambulatory Visit: Payer: Medicare Other

## 2018-11-13 ENCOUNTER — Telehealth: Payer: Self-pay

## 2018-11-13 ENCOUNTER — Ambulatory Visit (INDEPENDENT_AMBULATORY_CARE_PROVIDER_SITE_OTHER): Payer: Medicare Other | Admitting: Infectious Diseases

## 2018-11-13 VITALS — BP 166/108 | HR 70 | Temp 98.5°F | Wt 220.0 lb

## 2018-11-13 DIAGNOSIS — Z79899 Other long term (current) drug therapy: Secondary | ICD-10-CM

## 2018-11-13 DIAGNOSIS — H6592 Unspecified nonsuppurative otitis media, left ear: Secondary | ICD-10-CM

## 2018-11-13 DIAGNOSIS — Z113 Encounter for screening for infections with a predominantly sexual mode of transmission: Secondary | ICD-10-CM | POA: Diagnosis not present

## 2018-11-13 DIAGNOSIS — B20 Human immunodeficiency virus [HIV] disease: Secondary | ICD-10-CM

## 2018-11-13 DIAGNOSIS — L282 Other prurigo: Secondary | ICD-10-CM

## 2018-11-13 DIAGNOSIS — I10 Essential (primary) hypertension: Secondary | ICD-10-CM

## 2018-11-13 MED ORDER — HYDROXYZINE HCL 25 MG PO TABS: 25.0000 mg | ORAL_TABLET | Freq: Three times a day (TID) | ORAL | 0 refills | Status: DC | PRN

## 2018-11-13 MED ORDER — BETAMETHASONE DIPROPIONATE 0.05 % EX CREA: TOPICAL_CREAM | Freq: Two times a day (BID) | CUTANEOUS | 1 refills | Status: DC

## 2018-11-13 MED ORDER — FLUTICASONE PROPIONATE 50 MCG/ACT NA SUSP: 2.0000 | Freq: Every day | NASAL | 2 refills | Status: DC

## 2018-11-13 NOTE — Progress Notes (Signed)
Name: Lori [name]  DOB: May 07, 1971 MRN: [id number] PCP: Patient, No Pcp Per   Patient Active Problem List   Diagnosis Date Noted  . Middle ear effusion, left 11/15/2018  . Healthcare maintenance 03/09/2018  . Lung nodule 11/16/2017  . Cocaine abuse (HCC) 11/16/2017  . Syphilis 07/18/2017  . Essential hypertension 07/22/2014  . Back pain 12/24/2013  . Shingles 06/27/2013  . Multiple contusions 04/27/2012  . 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 Pruritic rash 10/18/2007  . ONYCHOMYCOSIS, BILATERAL 04/03/2007  . INSOMNIA 10/31/2006  . Migraine 10/31/2006  . Human immunodeficiency virus (HIV) disease (HCC) 06/27/2006  . ANXIETY DISORDER, GENERALIZED 06/27/2006  . TOBACCO ABUSE 06/27/2006  . Acute sinusitis 06/27/2006  . H/O total hysterectomy 12/20/2005    Subjective:   Chief Complaint  Patient presents with  . Rash    Lori [name] is here for evaluation of a pruritic rash that has erupted over her legs, forearms and buttocks.  She tells me that she was helping her father dig a ditch about 3 weeks ago in mid May.  She had exposure to water and dirt during this time and recalls wearing some "thick PJ pants" and boots as well as a long sleeve shirt .  She recalls having a small cut to the right posterior forearm that she attributed to a chigger bite.  It swelled a little bit but eventually went down.  Since this time she is had multiple other areas pop up on her legs, left arm and most recently her buttocks.  She describes these to be red papules that come in clusters of 1-3.  They are intensely pruritic and she has not found any topical relief from anything over-the-counter.  She is tried multiple creams and lotions.  She is not quite sure what else to try.  She is concerned that she has bacteria in her blood that is causing the symptoms.  Nobody in the household has these symptoms.  She denies any involvement around the stomach at the belt line or the groin.  She does have a cat that  sleeps with her periodically with a cat does have flea treatments routinely and she feels that this is unrelated.  She has not had any new detergents or soaps or personal hygiene products.  She has recently fumigated her mattress with a commercial pesticide and has been frequently bleaching her linens weekly in case this is related to something that she is encountering at sleep.  She feels at times that she is having things biting and crawling on her although she sees nothing.  She notes that her blood pressure is quite high today for her.  She has gained a significant amount of weight since her last office visit.  She tells me that she has unfortunately been drinking more alcohol and eating a ton of food abstaining from drugs as well as the stressors associated with social isolation and quarantine.    She has been using Sudafed on a daily basis for about 2 weeks now.  She reports nasal congestion, buzzing noise in her head, dizziness at times, crackling or movement in her left ear specifically.  She has no fevers, facial pain, headaches, malaise.   Review of Systems  All other systems reviewed and are negative.   Past Medical History:  Diagnosis Date  . Abnormal Pap smear    Had Cryo to treat  . Allergy   . Anemia   . Anxiety   . Cluster headaches   . HIV infection (  HCC)   . Lung nodule   . Vaginal venereal warts     Outpatient Medications Prior to Visit  Medication Sig Dispense Refill  . acetaminophen (TYLENOL) 325 MG tablet Take 650 mg by mouth every 6 (six) hours as needed.    Marland Kitchen BIKTARVY 50-200-25 MG TABS tablet TAKE 1 TABLET BY MOUTH DAILY. 90 tablet 0  . diphenhydramine-acetaminophen (TYLENOL PM) 25-500 MG TABS tablet Take 1 tablet by mouth at bedtime as needed (sleep).    . Na Sulfate-K Sulfate-Mg Sulf 17.5-3.13-1.6 GM/177ML SOLN Suprep (no substitutions)-TAKE AS DIRECTED. (Patient not taking: Reported on 11/13/2018) 354 mL 0  . phenylephrine (SUDAFED PE) 10 MG TABS tablet Take 10  mg by mouth every 4 (four) hours as needed.     No facility-administered medications prior to visit.      Allergies  Allergen Reactions  . Doxycycline Swelling  . Other Swelling    Mulberry fruit   . Sulfonamide Derivatives Other (See Comments)    Childhood allergy.    Social History   Tobacco Use  . Smoking status: Current Every Day Smoker    Packs/day: 1.00    Years: 27.00    Pack years: 27.00    Types: Cigarettes  . Smokeless tobacco: Never Used  . Tobacco comment: patient not ready to quit; considering patches  Substance Use Topics  . Alcohol use: Yes    Alcohol/week: 0.0 standard drinks    Comment: pint occ.  . Drug use: Yes    Frequency: 7.0 times per week    Types: Marijuana, Cocaine    Comment: not currently doing cocaine= Marijuana    Family History  Problem Relation Age of Onset  . CAD Mother        MI 2016  . Uterine cancer Mother   . Cervical cancer Mother   . Lung cancer Maternal Grandmother   . Cancer Maternal Grandfather        larynx  . Prostate cancer Maternal Grandfather   . Prostate cancer Father   . Breast cancer Neg Hx   . Liver disease Neg Hx   . Esophageal cancer Neg Hx   . Stomach cancer Neg Hx   . Colon cancer Neg Hx   . Colon polyps Neg Hx     Social History   Substance and Sexual Activity  Sexual Activity Yes  . Birth control/protection: Surgical, Condom   Comment: pt. declined condoms    Objective:   Vitals:   11/13/18 1506  BP: (!) 166/108  Pulse: 70  Temp: 98.5 F (36.9 C)  TempSrc: Oral  SpO2: 97%  Weight: 220 lb (99.8 kg)   Body mass index is 37.76 kg/m.  Physical Exam Constitutional:      Appearance: She is well-developed.     Comments: Seated comfortably in chair.  She has gained a significant amount of weight.  She is very pleasant today and in good spirits.  HENT:     Right Ear: Hearing and ear canal normal. No middle ear effusion. No foreign body. Tympanic membrane is not erythematous.     Left Ear:  Hearing and ear canal normal. A middle ear effusion is present. No foreign body. Tympanic membrane is not erythematous.     Nose: Mucosal edema and rhinorrhea present.     Right Sinus: No maxillary sinus tenderness.     Left Sinus: No maxillary sinus tenderness.     Mouth/Throat:     Mouth: No oral lesions.     Dentition: Normal  dentition. No dental abscesses.     Pharynx: No oropharyngeal exudate.  Lymphadenopathy:     Cervical: No cervical adenopathy.  Skin:    General: Skin is warm and dry.     Findings: Rash present.     Comments: On her legs she has widely scattered areas of healing papules that have been scratched and open.  Now only resembling a crusted scar.  There is no signs of superinfection.  The spots are still itchy per her report.  There is no erythema at the base. The 2 small areas on her forearms are healed resembling the same. Her buttocks have the newest lesions.  These are nonvesicular and solid small papules occurring in clusters of 1-3.  There are erythematous and swollen.  Evidence of scratching present with some abrasions on the skin.  These are not necessarily located only in the folds of skin but are kind of diffuse on her buttocks.  Neurological:     Mental Status: She is alert and oriented to person, place, and time.  Psychiatric:        Judgment: Judgment normal.     Comments: In good spirits today and engaged in care discussion     Lab Results Lab Results  Component Value Date   WBC 7.1 11/13/2018   HGB 13.5 11/13/2018   HCT 39.2 11/13/2018   MCV 94.9 11/13/2018   PLT 229 11/13/2018    Lab Results  Component Value Date   CREATININE 0.94 11/13/2018   BUN 17 11/13/2018   NA 140 11/13/2018   K 3.9 11/13/2018   CL 106 11/13/2018   CO2 25 11/13/2018    Lab Results  Component Value Date   ALT 14 11/13/2018   AST 20 11/13/2018   ALKPHOS 67 11/11/2017   BILITOT 0.3 11/13/2018    Lab Results  Component Value Date   CHOL 255 (H) 11/13/2018   HDL  52 11/13/2018   LDLCALC 171 (H) 11/13/2018   TRIG 170 (H) 11/13/2018   CHOLHDL 4.9 11/13/2018   HIV 1 RNA Quant (copies/mL)  Date Value  01/24/2018 <20 NOT DETECTED  05/16/2017 109,000 (H)  07/20/2016 <20 NOT DETECTED   CD4 T Cell Abs (/uL)  Date Value  11/13/2018 664  01/24/2018 740  11/11/2017 180 (L)     Assessment & Plan:   Problem List Items Addressed This Visit      Unprioritized   Essential hypertension    Blood pressure is quite elevated compared to the last 3 readings in our office before.  Likely multifactorial with increased smoking, increased weight gain, consistent daily Sudafed intake the last 2 weeks.  I asked her to please stop her Sudafed so that she can be reassessed off at return office visit in 2 to 3 weeks.      Human immunodeficiency virus (HIV) disease (HCC)    We will obtain her baseline lab work today prior to her office visit with Dr. Ninetta LightsHatcher for ongoing HIV care maintenance.  Sounds like she is doing a good job taking her medications and unlikely related in any way to her rash.      Middle ear effusion, left    History of allergic sinusitis and likely contributing to current middle ear effusion.  This likely explains her dizziness, off balance, crinkling/buzzing feeling in the ear.  I encouraged her to stop her Sudafed due to her hypertension.  She can use Coricidin if she desires but I encouraged her to use daily Flonase routinely for  the next 2 weeks to see if this improves.      Relevant Medications   fluticasone (FLONASE) 50 MCG/ACT nasal spray   Pruritic rash - Primary    I am not certain what is causing her rash.  There are necessarily look like bug bites but many of these lesions on her lower extremities have been present for some time and certainly still could be reflective of fleabites and dermatitis associated with that considering her Sleeping in the bed with her and the distribution pattern.  This does not look like scabies. No indication  for antibiotic currently.  We will try to treat with a stronger steroid cream twice a day until her next appointment with Dr. [name].  We will also administer as needed hydroxyzine to help with the itching.  She was grateful for my visit today.  May require dermatology referral if this persists without explanation and is refractory to steroids.      Relevant Medications   hydrOXYzine (ATARAX/VISTARIL) 25 MG tablet   betamethasone dipropionate (DIPROLENE) 0.05 % cream    Other Visit Diagnoses    Screening examination for venereal disease       Polypharmacy          Rexene Alberts, MSN, NP-C [facility] Pager: [phone] Office: [phone]  11/15/18  3:29 PM

## 2018-11-13 NOTE — Patient Instructions (Signed)
Return to know specifically the reason for your rash.  It could be triggered on by something you contacted while you are taking your ditch, chemicals related to something you frequently come into contact with at home.  I think less likely this is due to bug bites but still possible if you are around and exposed to animals or outside a lot.  I will send you in a prescription strength steroid cream to use twice a day on your itchy spots.  Make sure you keep them clean and try to keep your hands off from scratching to avoid secondary infection.  Hydroxyzine up to 3 times a day as needed for itching.  Please stop your Sudafed as your blood pressure is too high.  We will send you in some Flonase to help with your nasal congestion.   Please follow-up with Dr. [name] as we have currently scheduled.

## 2018-11-13 NOTE — Telephone Encounter (Signed)
Patient thinks she has a bacterial infection

## 2018-11-13 NOTE — Telephone Encounter (Signed)
   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  The itching seems to be worsening .  She is requesting visit today since she has a scheduled lab appointment.

## 2018-11-14 LAB — T-HELPER CELL (CD4) - (RCID CLINIC ONLY)
CD4 % Helper T Cell: 31 % — ABNORMAL LOW (ref 33–65)
CD4 T Cell Abs: 664 /uL (ref 400–1790)

## 2018-11-14 LAB — URINE CYTOLOGY ANCILLARY ONLY
Chlamydia: NEGATIVE
Neisseria Gonorrhea: NEGATIVE

## 2018-11-15 ENCOUNTER — Encounter: Payer: Medicare Other | Admitting: Internal Medicine

## 2018-11-15 DIAGNOSIS — H6592 Unspecified nonsuppurative otitis media, left ear: Secondary | ICD-10-CM | POA: Insufficient documentation

## 2018-11-15 NOTE — Assessment & Plan Note (Signed)
History of allergic sinusitis and likely contributing to current middle ear effusion.  This likely explains her dizziness, off balance, crinkling/buzzing feeling in the ear.  I encouraged her to stop her Sudafed due to her hypertension.  She can use Coricidin if she desires but I encouraged her to use daily Flonase routinely for the next 2 weeks to see if this improves.

## 2018-11-15 NOTE — Assessment & Plan Note (Signed)
Blood pressure is quite elevated compared to the last 3 readings in our office before.  Likely multifactorial with increased smoking, increased weight gain, consistent daily Sudafed intake the last 2 weeks.  I asked her to please stop her Sudafed so that she can be reassessed off at return office visit in 2 to 3 weeks.

## 2018-11-15 NOTE — Assessment & Plan Note (Signed)
We will obtain her baseline lab work today prior to her office visit with Dr. [name] for ongoing HIV care maintenance.  Sounds like she is doing a good job taking her medications and unlikely related in any way to her rash.

## 2018-11-15 NOTE — Assessment & Plan Note (Signed)
I am not certain what is causing her rash.  There are necessarily look like bug bites but many of these lesions on her lower extremities have been present for some time and certainly still could be reflective of fleabites and dermatitis associated with that considering her Sleeping in the bed with her and the distribution pattern.  This does not look like scabies. No indication for antibiotic currently.  We will try to treat with a stronger steroid cream twice a day until her next appointment with Dr. [name].  We will also administer as needed hydroxyzine to help with the itching.  She was grateful for my visit today.  May require dermatology referral if this persists without explanation and is refractory to steroids.

## 2018-11-21 LAB — LIPID PANEL
Cholesterol: 255 mg/dL — ABNORMAL HIGH (ref <200)
HDL: 52 mg/dL (ref >=50)
LDL Cholesterol (Calc): 171 mg/dL (calc) — ABNORMAL HIGH
Non-HDL Cholesterol (Calc): 203 mg/dL (calc) — ABNORMAL HIGH (ref <130)
Total CHOL/HDL Ratio: 4.9 (calc) (ref <5.0)
Triglycerides: 170 mg/dL — ABNORMAL HIGH (ref <150)

## 2018-11-21 LAB — COMPREHENSIVE METABOLIC PANEL
AG Ratio: 1.8 (calc) (ref 1.0–2.5)
ALT: 14 U/L (ref 6–29)
AST: 20 U/L (ref 10–35)
Albumin: 4.7 g/dL (ref 3.6–5.1)
Alkaline phosphatase (APISO): 54 U/L (ref 31–125)
BUN: 17 mg/dL (ref 7–25)
CO2: 25 mmol/L (ref 20–32)
Calcium: 9.6 mg/dL (ref 8.6–10.2)
Chloride: 106 mmol/L (ref 98–110)
Creat: 0.94 mg/dL (ref 0.50–1.10)
Globulin: 2.6 g/dL (calc) (ref 1.9–3.7)
Glucose, Bld: 90 mg/dL (ref 65–99)
Potassium: 3.9 mmol/L (ref 3.5–5.3)
Sodium: 140 mmol/L (ref 135–146)
Total Bilirubin: 0.3 mg/dL (ref 0.2–1.2)
Total Protein: 7.3 g/dL (ref 6.1–8.1)

## 2018-11-21 LAB — CBC
HCT: 39.2 % (ref 35.0–45.0)
Hemoglobin: 13.5 g/dL (ref 11.7–15.5)
MCH: 32.7 pg (ref 27.0–33.0)
MCHC: 34.4 g/dL (ref 32.0–36.0)
MCV: 94.9 fL (ref 80.0–100.0)
MPV: 10.5 fL (ref 7.5–12.5)
Platelets: 229 10*3/uL (ref 140–400)
RBC: 4.13 10*6/uL (ref 3.80–5.10)
RDW: 13 % (ref 11.0–15.0)
WBC: 7.1 10*3/uL (ref 3.8–10.8)

## 2018-11-21 LAB — HIV-1 RNA QUANT-NO REFLEX-BLD
HIV 1 RNA Quant: <20 copies/mL
HIV-1 RNA Quant, Log: <1.3 Log copies/mL

## 2018-11-21 LAB — RPR: RPR Ser Ql: NONREACTIVE

## 2018-11-27 ENCOUNTER — Encounter: Payer: Medicare Other | Admitting: Infectious Diseases

## 2018-12-07 MED FILL — BIKTARVY 50-200-25 MG TABS: 50-200-25 | 30 days supply | Qty: 30 | Fill #1

## 2018-12-12 ENCOUNTER — Telehealth: Payer: Self-pay | Admitting: *Deleted

## 2018-12-12 NOTE — Telephone Encounter (Signed)
Patient called to say she has a sinus infection and would like for Hatcher to send in some Augmentin for her to pick up. Advised her not sure he will do that but will ask and give her a call back.

## 2018-12-13 MED ORDER — AMOXICILLIN-POT CLAVULANATE 875-125 MG PO TABS: 1.0000 | ORAL_TABLET | Freq: Two times a day (BID) | ORAL | 0 refills | Status: DC

## 2018-12-13 NOTE — Telephone Encounter (Signed)
Called patient to advise Dr Johnnye Sima gave ok to send in Rx for sinus infection. Rx sent to pharmacy on file as patient did not answer phone.

## 2018-12-13 NOTE — Telephone Encounter (Signed)
That sounds great, thanks augmentin 875 mg bid for 7 days thanks

## 2018-12-13 NOTE — Addendum Note (Signed)
Addended by: Reggy Eye on: 12/13/2018 04:29 PM   Modules accepted: Orders

## 2018-12-19 ENCOUNTER — Other Ambulatory Visit: Payer: Self-pay

## 2018-12-19 ENCOUNTER — Ambulatory Visit (INDEPENDENT_AMBULATORY_CARE_PROVIDER_SITE_OTHER): Payer: Medicare Other | Admitting: Infectious Diseases

## 2018-12-19 ENCOUNTER — Encounter: Payer: Self-pay | Admitting: Infectious Diseases

## 2018-12-19 VITALS — BP 136/89 | HR 68 | Temp 98.2°F | Ht 64.0 in | Wt 220.0 lb

## 2018-12-19 DIAGNOSIS — Z79899 Other long term (current) drug therapy: Secondary | ICD-10-CM

## 2018-12-19 DIAGNOSIS — Z113 Encounter for screening for infections with a predominantly sexual mode of transmission: Secondary | ICD-10-CM | POA: Diagnosis not present

## 2018-12-19 DIAGNOSIS — F17200 Nicotine dependence, unspecified, uncomplicated: Secondary | ICD-10-CM

## 2018-12-19 DIAGNOSIS — I10 Essential (primary) hypertension: Secondary | ICD-10-CM

## 2018-12-19 DIAGNOSIS — M25552 Pain in left hip: Secondary | ICD-10-CM

## 2018-12-19 DIAGNOSIS — A630 Anogenital (venereal) warts: Secondary | ICD-10-CM

## 2018-12-19 DIAGNOSIS — G8929 Other chronic pain: Secondary | ICD-10-CM | POA: Diagnosis not present

## 2018-12-19 DIAGNOSIS — R911 Solitary pulmonary nodule: Secondary | ICD-10-CM | POA: Diagnosis not present

## 2018-12-19 DIAGNOSIS — B20 Human immunodeficiency virus [HIV] disease: Secondary | ICD-10-CM | POA: Diagnosis not present

## 2018-12-19 MED ORDER — NICOTINE 21 MG/24HR TD PT24: 21.0000 mg | MEDICATED_PATCH | Freq: Every day | TRANSDERMAL | 0 refills | Status: AC

## 2018-12-19 NOTE — Assessment & Plan Note (Signed)
She will schedule colonoscopy I strongly encouraged her to do this given the possibility her mom has colon cancer.

## 2018-12-19 NOTE — Assessment & Plan Note (Signed)
CT 04-2018 was no nodule. COPD.  Will resolve.

## 2018-12-19 NOTE — Assessment & Plan Note (Signed)
asx and borderline/normotensive today.

## 2018-12-19 NOTE — Assessment & Plan Note (Signed)
She is doing well on biktarvy She has had mammo  Needs pap given condoms.  rtc in 9 months

## 2018-12-19 NOTE — Assessment & Plan Note (Signed)
Will check plain film Consider ortho eval.

## 2018-12-19 NOTE — Progress Notes (Signed)
   Subjective:    Patient ID: Lori [name], female    DOB: 1971/02/02, 48 y.o.   MRN: [id number]  HPI 48yo F with HIV+ (dx 2004), anxiety d/o, anal/vaginal warts, tobacco use.  Previously onatripla (made her depressed suicidal). tried odefsy but couldn't tolerate. Then changed to [place] which she went off of . States it makes her throw up.At her f/u 11-2017, she was changed to biktarvy. No problems with her current ART.  Had PAP 09-2012 NL, 06-24-14 NL.  Previous hysterectomy. Mammo 04-2018.  Son Lori [name], born 2004, HIV -. Is A/B honor roll, working at home on [employer]. She tells me he is gay.  She had tele-eval for rectal prolapse. She was set for colonoscopy but has deferred while working up her mom's health.     HIV 1 RNA Quant (copies/mL)  Date Value  11/13/2018 <20 NOT DETECTED  01/24/2018 <20 NOT DETECTED  05/16/2017 109,000 (H)   CD4 T Cell Abs (/uL)  Date Value  11/13/2018 664  01/24/2018 740  11/11/2017 180 (L)   C/o hip pain L since January. No problem ambulating.  Would like nicotine patches. Has had "nasty cough" since January.   Review of Systems  Constitutional: Negative for appetite change, chills, fever and unexpected weight change.  Respiratory: Positive for cough.   Gastrointestinal: Negative for abdominal pain, constipation and diarrhea.  Genitourinary: Negative for difficulty urinating.  Musculoskeletal: Positive for arthralgias.  most she has ever weighed.  Please see HPI. All other systems reviewed and negative.     Objective:   Physical Exam Constitutional:      Appearance: Normal appearance. She is obese.  HENT:     Head: Normocephalic and atraumatic.     Mouth/Throat:     Mouth: Mucous membranes are moist.     Pharynx: Oropharynx is clear. No oropharyngeal exudate.  Eyes:     Extraocular Movements: Extraocular movements intact.     Pupils: Pupils are equal, round, and reactive to light.  Neck:     Musculoskeletal: Normal  range of motion and neck supple.  Cardiovascular:     Rate and Rhythm: Normal rate and regular rhythm.  Pulmonary:     Effort: Pulmonary effort is normal.     Breath sounds: Normal breath sounds.  Abdominal:     General: Bowel sounds are normal. There is no distension.     Palpations: Abdomen is soft.     Tenderness: There is no abdominal tenderness.  Musculoskeletal:     Right lower leg: No edema.     Left lower leg: No edema.  Neurological:     General: No focal deficit present.     Mental Status: She is alert and oriented to person, place, and time.  Psychiatric:        Mood and Affect: Mood normal.           Assessment & Plan:

## 2018-12-19 NOTE — Assessment & Plan Note (Signed)
Nicotine patches Currently smoking 1 ppd. And marijuana

## 2019-01-04 ENCOUNTER — Ambulatory Visit: Payer: Medicare Other | Admitting: Infectious Diseases

## 2019-01-08 MED FILL — BIKTARVY 50-200-25 MG TABS: 50-200-25 | 30 days supply | Qty: 30 | Fill #2

## 2019-01-29 ENCOUNTER — Encounter: Payer: Self-pay | Admitting: Infectious Diseases

## 2019-01-29 ENCOUNTER — Other Ambulatory Visit (HOSPITAL_COMMUNITY)
Admission: RE | Admit: 2019-01-29 | Discharge: 2019-01-29 | Disposition: A | Discharge status: Home / Self Care | Payer: Medicare Other | Source: Ambulatory Visit | Attending: Infectious Diseases | Admitting: Infectious Diseases

## 2019-01-29 ENCOUNTER — Other Ambulatory Visit: Payer: Self-pay

## 2019-01-29 ENCOUNTER — Ambulatory Visit (INDEPENDENT_AMBULATORY_CARE_PROVIDER_SITE_OTHER): Payer: Medicare Other | Admitting: Infectious Diseases

## 2019-01-29 DIAGNOSIS — Z113 Encounter for screening for infections with a predominantly sexual mode of transmission: Secondary | ICD-10-CM

## 2019-01-29 DIAGNOSIS — R87622 Low grade squamous intraepithelial lesion on cytologic smear of vagina (LGSIL): Secondary | ICD-10-CM | POA: Diagnosis not present

## 2019-01-29 DIAGNOSIS — Z124 Encounter for screening for malignant neoplasm of cervix: Secondary | ICD-10-CM

## 2019-01-29 DIAGNOSIS — R87811 Vaginal high risk human papillomavirus (HPV) DNA test positive: Secondary | ICD-10-CM | POA: Insufficient documentation

## 2019-01-29 DIAGNOSIS — Z1151 Encounter for screening for human papillomavirus (HPV): Secondary | ICD-10-CM | POA: Insufficient documentation

## 2019-01-29 NOTE — Progress Notes (Signed)
      Subjective:    Lori [name] is a 48 y.o. female here for an annual pelvic exam and pap smear.   Review of Systems: Current GYN complaints or concerns: No concerns.  She had a hysterectomy for non-oncogenic reasons in the past.  Previously had a LEEP procedure but since had always had negative cytology from cervical specimens.   She is concerned with rectal prolapse and loss of control of her bowels.  She was scheduled for a colonoscopy screening however due to COVID this was pushed off.  In the past is engaged in frequent receptive anal intercourse.  Patient denies any abdominal/pelvic pain, problems with bowel movements, urination, vaginal discharge or intercourse.   Past Medical History:  Diagnosis Date  . Abnormal Pap smear    Had Cryo to treat  . Allergy   . Anemia   . Anxiety   . Cluster headaches   . HIV infection ([place])   . Lung nodule   . Vaginal venereal warts     Gynecologic History: [id number]  No LMP recorded. Patient has had a hysterectomy. Contraception: status post hysterectomy Last Pap: 06/2017. Results were: normal Anal Intercourse: Yes Last Mammogram: 2019. Results were: normal  Objective:  Physical Exam  Constitutional: Well developed, well nourished, no acute distress. She is alert and oriented x3.  Pelvic: External genitalia reveals a few flat condyloma scattered on mons pubis but other wise w/o open lesions/sores. Normal hair pattern distribution. The vagina is normal in appearance. The vaginal cuff is normal in appearance with exception of a small polyp in the 10 o'clock position. The cervix is surgically absent.   Rectum: External exam does not reveal any prolapsed organ  Breasts: symmetrical in contour, shape and texture. No palpable masses/nodules. No nipple discharge.  Psych: She has a normal mood and affect.     Assessment:  Normal pelvic exam Normal vaginal cuff with 1 small polyp Perimenopause symptoms Well controlled HIV  Plan:   Health Maintenance =   Vaginal cytology and HPV obtained today  Results will be communicated to the patient via my chart  Given the polyp, will continue with annual vaginal screens and vaginal inspection.  Referral to gynecology if she becomes symptomatic or this grows in size.  She has been counseled and instructed how to perform monthly self breast exams.  Screening mammogram to be scheduled this year   Vaginal Polyp =  Asymptomatic.  Would follow with annual inspections.  Referral to gynecology if she becomes symptomatic or if this increases in size.  Contraception / Family Planning =   Status post hysterectomy  Encopresis =   She will reschedule her follow up appointment with GI for colonoscopy.  She may require surgical referral for colorectal intervention.  HIV =   She will continue her Biktarvy and F/U as scheduled with Dr. Johnnye Sima for ongoing HIV care.      Janene Madeira, MSN, NP-C [facility] for Infectious Disease [email or web address] Pager: [phone] Office: [place]: [phone]    01/29/19 10:49 AM

## 2019-01-31 ENCOUNTER — Other Ambulatory Visit: Payer: Self-pay

## 2019-01-31 ENCOUNTER — Ambulatory Visit (HOSPITAL_COMMUNITY)
Admission: RE | Admit: 2019-01-31 | Discharge: 2019-01-31 | Disposition: A | Discharge status: Home / Self Care | Payer: Medicare Other | Source: Ambulatory Visit | Attending: Infectious Diseases | Admitting: Infectious Diseases

## 2019-01-31 ENCOUNTER — Ambulatory Visit (HOSPITAL_BASED_OUTPATIENT_CLINIC_OR_DEPARTMENT_OTHER): Payer: Medicare Other

## 2019-01-31 DIAGNOSIS — M25552 Pain in left hip: Secondary | ICD-10-CM | POA: Diagnosis not present

## 2019-01-31 DIAGNOSIS — G8929 Other chronic pain: Secondary | ICD-10-CM | POA: Diagnosis not present

## 2019-01-31 DIAGNOSIS — M1612 Unilateral primary osteoarthritis, left hip: Secondary | ICD-10-CM | POA: Diagnosis not present

## 2019-01-31 LAB — URINE CYTOLOGY ANCILLARY ONLY
Chlamydia: NEGATIVE
Neisseria Gonorrhea: NEGATIVE

## 2019-01-31 NOTE — Addendum Note (Signed)
Addended by: Eugenia Mcalpine on: 01/31/2019 09:24 AM   Modules accepted: Orders

## 2019-02-01 ENCOUNTER — Other Ambulatory Visit: Payer: Self-pay | Admitting: Infectious Diseases

## 2019-02-01 DIAGNOSIS — B20 Human immunodeficiency virus [HIV] disease: Secondary | ICD-10-CM

## 2019-02-01 LAB — CYTOLOGY - PAP
HPV 16/18/45 genotyping: NEGATIVE
HPV: DETECTED — AB

## 2019-02-01 NOTE — Progress Notes (Signed)
Patient with VAIN-1, +HPV (non-oncogenic); in accordance with ASSCP algorhithm for managing Vaginal Dysplasia will repeat annually x 2 with co-test approach.  Results communicated with [name] via [place].

## 2019-02-08 MED FILL — BIKTARVY 50-200-25 MG TABS: 50-200-25 | 30 days supply | Qty: 30 | Fill #0

## 2019-03-08 MED FILL — BIKTARVY 50-200-25 MG TABS: 50-200-25 | 30 days supply | Qty: 30 | Fill #1

## 2019-04-06 MED FILL — BIKTARVY 50-200-25 MG TABS: 50-200-25 | 30 days supply | Qty: 30 | Fill #2

## 2019-04-25 ENCOUNTER — Telehealth: Payer: Self-pay | Admitting: *Deleted

## 2019-04-25 NOTE — Telephone Encounter (Signed)
Patient will go to urgent care locally tomorrow.

## 2019-04-25 NOTE — Telephone Encounter (Signed)
Patient called to see if Dr Johnnye Sima could call in an antibiotic for what she feels is a sinus infection.  RN advised that Dr Johnnye Sima was not in the office today, but would send the request.  [facility] [place] later today if Dr Johnnye Sima is unable to send in the antibiotic.  RN agreed with this plan, will contact her with update. Landis Gandy, RN

## 2019-05-08 MED FILL — BIKTARVY 50-200-25 MG TABS: 50-200-25 | 30 days supply | Qty: 30 | Fill #3

## 2019-06-11 MED FILL — BIKTARVY 50-200-25 MG TABS: 50-200-25 | 30 days supply | Qty: 30 | Fill #4

## 2019-07-09 MED FILL — BIKTARVY 50-200-25 MG TABS: 50-200-25 | 30 days supply | Qty: 30 | Fill #5

## 2019-08-09 MED FILL — BIKTARVY 50-200-25 MG TABS: 50-200-25 | 30 days supply | Qty: 30 | Fill #6

## 2019-08-20 ENCOUNTER — Telehealth: Payer: Self-pay

## 2019-08-20 NOTE — Telephone Encounter (Signed)
Patient called triage inquiring about whether or not she should receive the COVID vaccine. Patient was worried that due to her previous experiences with the flu shot that she would risk getting COVID after receiving the shot. Explained to patient that while she might experience an immune response, it is expected and she did not contract COVID from the vaccine. Patient verbalized understanding and said she would contact her local HD to find out how to sign up.   Cassondra Stachowski Loyola Mast, RN

## 2019-08-23 ENCOUNTER — Ambulatory Visit
Admission: EM | Admit: 2019-08-23 | Discharge: 2019-08-23 | Disposition: A | Discharge status: Home / Self Care | Payer: Medicare Other

## 2019-08-23 ENCOUNTER — Other Ambulatory Visit: Payer: Self-pay

## 2019-08-23 DIAGNOSIS — S46911A Strain of unspecified muscle, fascia and tendon at shoulder and upper arm level, right arm, initial encounter: Secondary | ICD-10-CM

## 2019-08-23 DIAGNOSIS — S4991XA Unspecified injury of right shoulder and upper arm, initial encounter: Secondary | ICD-10-CM

## 2019-08-23 MED ORDER — PREDNISONE 10 MG (21) PO TBPK: ORAL_TABLET | Freq: Every day | ORAL | 0 refills | Status: DC

## 2019-08-23 MED ORDER — CYCLOBENZAPRINE HCL 10 MG PO TABS: 10.0000 mg | ORAL_TABLET | Freq: Every day | ORAL | 0 refills | Status: DC

## 2019-08-23 NOTE — Discharge Instructions (Signed)
Continue conservative management of rest, ice, elevation, and gentle stretches (pendulum swings and wall crawls) Prednisone taper prescribed.  Take as directed and to completion Take cyclobenzaprine at nighttime for symptomatic relief. Avoid driving or operating heavy machinery while using medication. Follow up with PCP or orthopedist if symptoms persist Return or go to the ER if you have any new or worsening symptoms (fever, chills, chest pain, shortness of breath, bruising, swelling, worsening symptoms despite medication, etc...)

## 2019-08-23 NOTE — ED Triage Notes (Signed)
Pt reports tripped Tuesday and hit r shoulder on something as she fell.  C/O pain to r shoulder.

## 2019-08-23 NOTE — ED Provider Notes (Addendum)
[place]   [id number] 08/23/19 Arrival Time: 4034  CC: RT shoulder pain/ injury  SUBJECTIVE: History from: patient. Lori [name] is a 49 y.o. female hx significant for HIV, complains of RT shoulder pain/ injury that began 2 days.  Denies a precipitating event or specific injury.  Localizes the pain to the outside of RT shoulder.  Describes the pain as intermittent and achy in character.  Has tried OTC medications with minimal relief.  Symptoms are made worse with ROM about the shoulder.  Denies similar symptoms in the past.  Complains of decreased ROM.  Denies fever, chills, erythema, ecchymosis, effusion, weakness, numbness and tingling.  ROS: As per HPI.  All other pertinent ROS negative.     Past Medical History:  Diagnosis Date  . Abnormal Pap smear    Had Cryo to treat  . Allergy   . Anemia   . Anxiety   . Cluster headaches   . HIV infection ([place])   . Lung nodule   . Vaginal venereal warts    Past Surgical History:  Procedure Laterality Date  . ABDOMINAL HYSTERECTOMY    . KNEE ARTHROSCOPY Left    bakers cyst removal  . TUBAL LIGATION     Allergies  Allergen Reactions  . Doxycycline Swelling  . Other Swelling    Mulberry fruit   . Sulfonamide Derivatives Other (See Comments)    Childhood allergy.   No current facility-administered medications on file prior to encounter.   Current Outpatient Medications on File Prior to Encounter  Medication Sig Dispense Refill  . acetaminophen (TYLENOL) 325 MG tablet Take 650 mg by mouth every 6 (six) hours as needed.    Marland Kitchen BIKTARVY 50-200-25 MG TABS tablet TAKE 1 TABLET BY MOUTH DAILY. 90 tablet 3  . ibuprofen (ADVIL) 200 MG tablet Take 200 mg by mouth every 6 (six) hours as needed for moderate pain.    . [DISCONTINUED] diphenhydramine-acetaminophen (TYLENOL PM) 25-500 MG TABS tablet Take 1 tablet by mouth at bedtime as needed (sleep).    . [DISCONTINUED] fluticasone (FLONASE) 50 MCG/ACT nasal spray Place 2  sprays into both nostrils daily. 16 g 2   Social History   Socioeconomic History  . Marital status: Single    Spouse name: Not on file  . Number of children: Not on file  . Years of education: Not on file  . Highest education level: Not on file  Occupational History  . Not on file  Tobacco Use  . Smoking status: Current Every Day Smoker    Packs/day: 1.00    Years: 27.00    Pack years: 27.00    Types: Cigarettes  . Smokeless tobacco: Never Used  . Tobacco comment: patient not ready to quit; considering patches  Substance and Sexual Activity  . Alcohol use: Yes    Alcohol/week: 0.0 standard drinks    Comment: pint occ.  . Drug use: Yes    Frequency: 7.0 times per week    Types: Marijuana, Cocaine    Comment: not currently doing cocaine= Marijuana  . Sexual activity: Yes    Birth control/protection: Surgical, Condom    Comment: pt. declined condoms  Other Topics Concern  . Not on file  Social History Narrative  . Not on file   Social Determinants of Health   Financial Resource Strain:   . Difficulty of Paying Living Expenses:   Food Insecurity:   . Worried About Charity fundraiser in the Last Year:   . Ran  Out of Food in the Last Year:   Transportation Needs:   . Lack of Transportation (Medical):   Marland Kitchen Lack of Transportation (Non-Medical):   Physical Activity:   . Days of Exercise per Week:   . Minutes of Exercise per Session:   Stress:   . Feeling of Stress :   Social Connections:   . Frequency of Communication with Friends and Family:   . Frequency of Social Gatherings with Friends and Family:   . Attends Religious Services:   . Active Member of Clubs or Organizations:   . Attends Banker Meetings:   Marland Kitchen Marital Status:   Intimate Partner Violence:   . Fear of Current or Ex-Partner:   . Emotionally Abused:   Marland Kitchen Physically Abused:   . Sexually Abused:    Family History  Problem Relation Age of Onset  . CAD Mother        MI 2016  . Uterine  cancer Mother   . Cervical cancer Mother   . Colon cancer Mother   . Lung cancer Maternal Grandmother   . Cancer Maternal Grandfather        larynx  . Prostate cancer Maternal Grandfather   . Prostate cancer Father   . Breast cancer Neg Hx   . Liver disease Neg Hx   . Esophageal cancer Neg Hx   . Stomach cancer Neg Hx   . Colon polyps Neg Hx     OBJECTIVE:  Vitals:   08/23/19 0825 08/23/19 0826  BP: (!) 158/101   Pulse: 70   Resp: 20   Temp: 98.6 F (37 C)   TempSrc: Oral   SpO2: 97%   Weight:  250 lb (113.4 kg)  Height:  5\' 4"  (1.626 m)    General appearance: ALERT; in no acute distress.  Head: NCAT Lungs: Normal respiratory effort; CTAB CV: RRR Musculoskeletal: RT shoulder Inspection: per patient without obvious erythema, effusion, or ecchymosis.   Palpation: mildly tender to deep palpation over lateral shoulder/ deltoid ROM: LROM 0-90 passive Strength: 5/5 shld abduction, 5/5 shld adduction, 5/5 elbow flexion, 5/5 elbow extension Skin: warm and dry Neurologic: Ambulates without difficulty; Sensation intact about the upper extremities Psychological: alert and cooperative; normal mood and affect  ASSESSMENT & PLAN:  1. Injury of right shoulder, initial encounter   2. Strain of right shoulder, initial encounter     Meds ordered this encounter  Medications  . predniSONE (STERAPRED UNI-PAK 21 TAB) 10 MG (21) TBPK tablet    Sig: Take by mouth daily. Take 6 tabs by mouth daily  for 2 days, then 5 tabs for 2 days, then 4 tabs for 2 days, then 3 tabs for 2 days, 2 tabs for 2 days, then 1 tab by mouth daily for 2 days    Dispense:  42 tablet    Refill:  0    Order Specific Question:   Supervising Provider    Answer:   Eustace Moore  . cyclobenzaprine (FLEXERIL) 10 MG tablet    Sig: Take 1 tablet (10 mg total) by mouth at bedtime.    Dispense:  15 tablet    Refill:  0    Order Specific Question:   Supervising Provider    Answer:   [[id number]]  Eustace Moore   We will hold off on x-ray today Continue conservative management of rest, ice, elevation, and gentle stretches (pendulum swings and wall crawls) Prednisone taper prescribed.  Take as directed and to completion Take  cyclobenzaprine at nighttime for symptomatic relief. Avoid driving or operating heavy machinery while using medication. Follow up with PCP or orthopedist if symptoms persist Return or go to the ER if you have any new or worsening symptoms (fever, chills, chest pain, shortness of breath, bruising, swelling, worsening symptoms despite medication, etc...)   Reviewed expectations re: course of current medical issues. Questions answered. Outlined signs and symptoms indicating need for more acute intervention. Patient verbalized understanding. After Visit Summary given.    Rennis Harding, PA-C 08/23/19 0856    Rennis Harding, PA-C 08/23/19 [phone]

## 2019-09-07 MED FILL — BIKTARVY 50-200-25 MG TABS: 50-200-25 | 30 days supply | Qty: 30 | Fill #7

## 2019-09-19 ENCOUNTER — Other Ambulatory Visit: Payer: Medicare Other

## 2019-09-20 IMAGING — DX DG HIP (WITH OR WITHOUT PELVIS) 2-3V LEFT
3 series · 3 of 3 positions shown · non-contrast
Comparison: None

CLINICAL DATA: Left hip pain since about July 2018, no injury

EXAM:
DG HIP (WITH OR WITHOUT PELVIS) 2-3V LEFT

[pelvis ap]
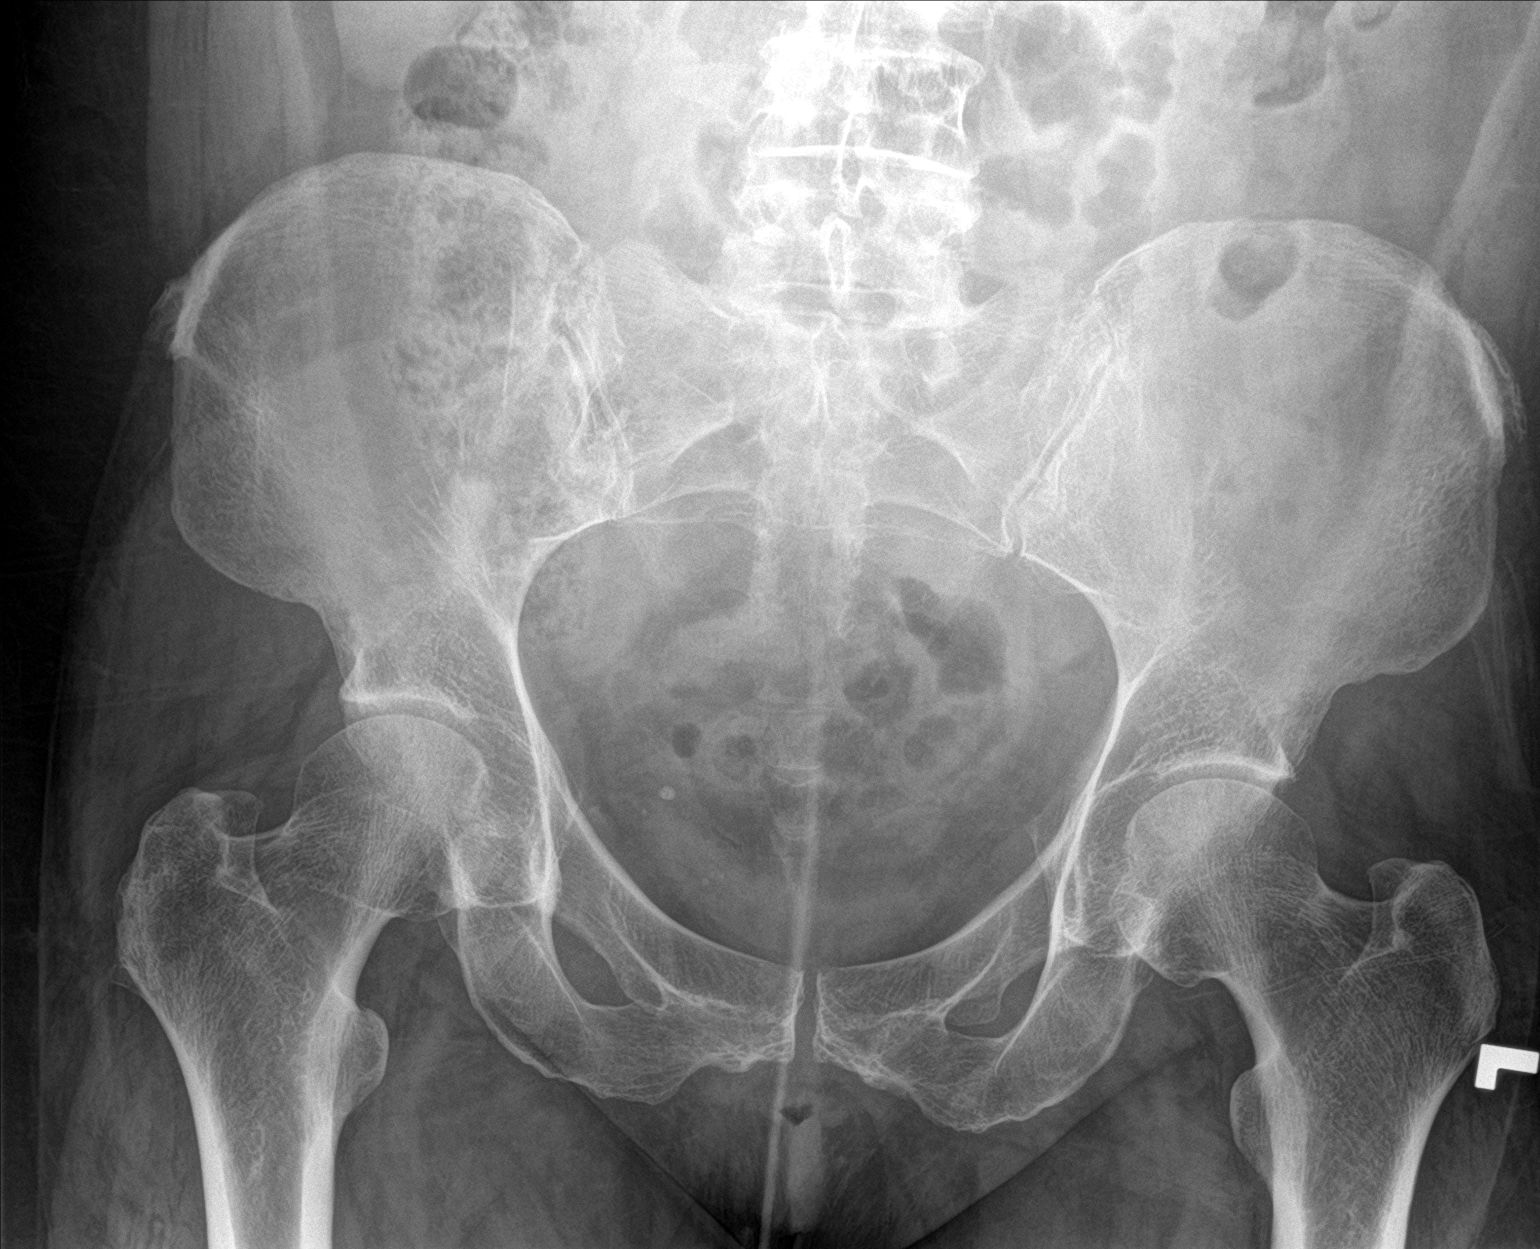

[hip ap]
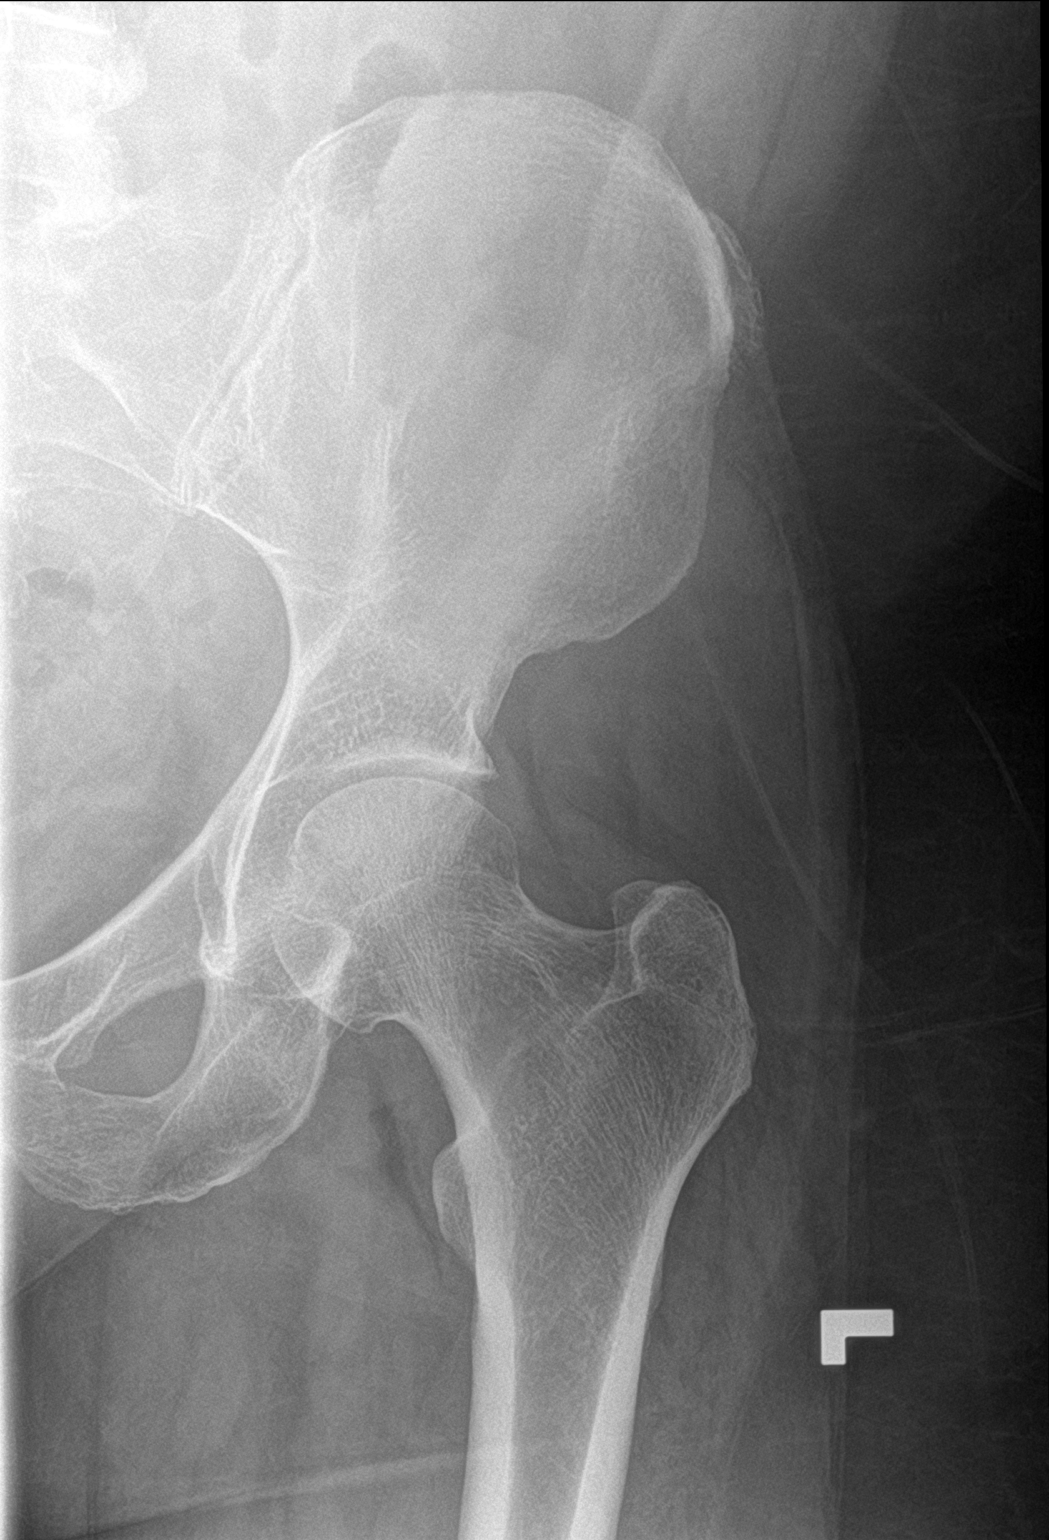

[hip lat]
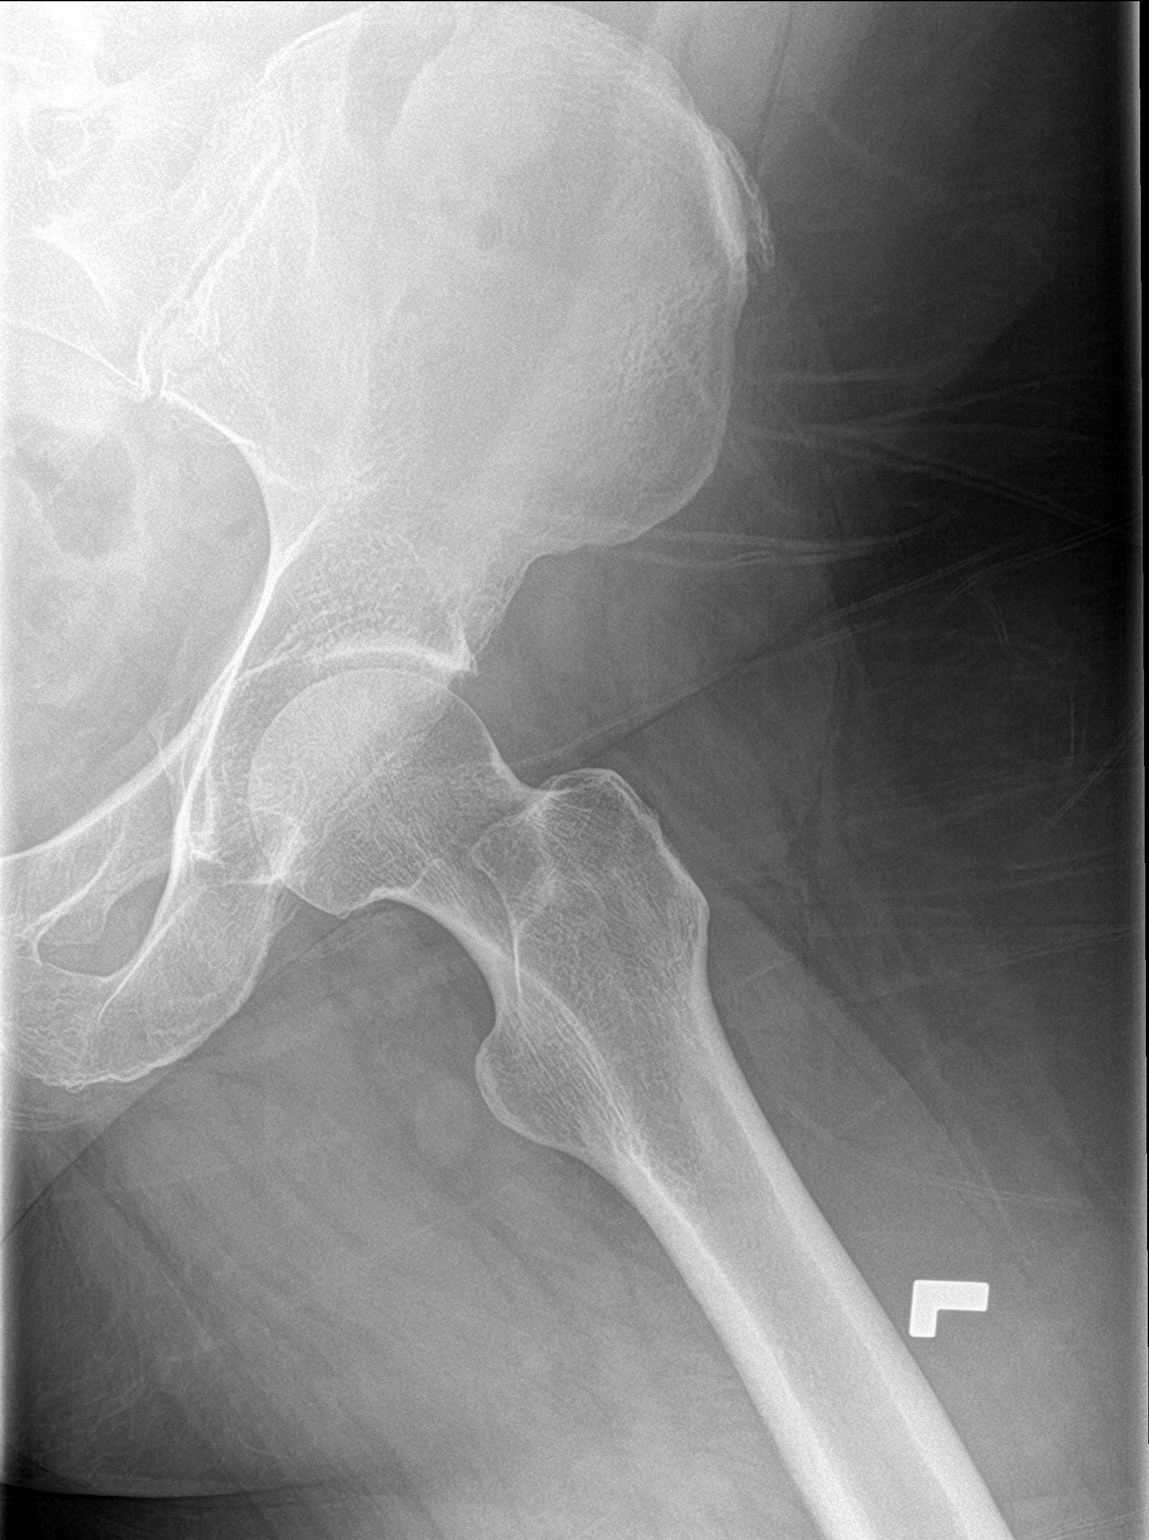

[3 of 3 positions shown; findings below may reference images not displayed]

FINDINGS: There is no evidence of hip fracture or dislocation. The bilateral
glenohumeral joint spaces are maintained. There is mild left greater
than right acetabular spurring. No focal bony lesion. Enthesopathic
changes are seen at the bilateral anterior iliac spines. No acute
osseous abnormality in the visualized remaining pelvis and lower
lumbar spine. Nonobstructive bowel gas pattern.
IMPRESSION: 1. No acute osseous abnormality.
2. Mild degenerative changes in the left hip.

## 2019-10-05 MED FILL — BIKTARVY 50-200-25 MG TABS: 50-200-25 | 30 days supply | Qty: 30 | Fill #8

## 2019-10-09 ENCOUNTER — Encounter: Payer: Medicare Other | Admitting: Infectious Diseases

## 2019-11-05 MED FILL — BIKTARVY 50-200-25 MG TABS: 50-200-25 | 30 days supply | Qty: 30 | Fill #9

## 2019-12-03 ENCOUNTER — Other Ambulatory Visit: Payer: Medicare Other

## 2019-12-03 ENCOUNTER — Other Ambulatory Visit: Payer: Self-pay

## 2019-12-03 ENCOUNTER — Other Ambulatory Visit (HOSPITAL_COMMUNITY)
Admission: RE | Admit: 2019-12-03 | Discharge: 2019-12-03 | Disposition: A | Discharge status: Home / Self Care | Payer: Medicare Other | Source: Ambulatory Visit | Attending: Infectious Diseases | Admitting: Infectious Diseases

## 2019-12-03 DIAGNOSIS — Z79899 Other long term (current) drug therapy: Secondary | ICD-10-CM

## 2019-12-03 DIAGNOSIS — Z113 Encounter for screening for infections with a predominantly sexual mode of transmission: Secondary | ICD-10-CM | POA: Insufficient documentation

## 2019-12-03 DIAGNOSIS — B20 Human immunodeficiency virus [HIV] disease: Secondary | ICD-10-CM

## 2019-12-03 NOTE — Addendum Note (Signed)
Addended byJimmy Picket F on: 12/03/2019 11:55 AM   Modules accepted: Orders

## 2019-12-04 LAB — URINE CYTOLOGY ANCILLARY ONLY
Chlamydia: NEGATIVE
Comment: NEGATIVE
Comment: NORMAL
Neisseria Gonorrhea: NEGATIVE

## 2019-12-04 LAB — T-HELPER CELL (CD4) - (RCID CLINIC ONLY)
CD4 % Helper T Cell: 28 % — ABNORMAL LOW (ref 33–65)
CD4 T Cell Abs: 503 /uL (ref 400–1790)

## 2019-12-05 LAB — COMPREHENSIVE METABOLIC PANEL
AG Ratio: 2.2 (calc) (ref 1.0–2.5)
ALT: 13 U/L (ref 6–29)
AST: 17 U/L (ref 10–35)
Albumin: 4.4 g/dL (ref 3.6–5.1)
Alkaline phosphatase (APISO): 64 U/L (ref 31–125)
BUN: 17 mg/dL (ref 7–25)
CO2: 25 mmol/L (ref 20–32)
Calcium: 9.2 mg/dL (ref 8.6–10.2)
Chloride: 105 mmol/L (ref 98–110)
Creat: 0.88 mg/dL (ref 0.50–1.10)
Globulin: 2 g/dL (calc) (ref 1.9–3.7)
Glucose, Bld: 90 mg/dL (ref 65–99)
Potassium: 4 mmol/L (ref 3.5–5.3)
Sodium: 142 mmol/L (ref 135–146)
Total Bilirubin: 0.3 mg/dL (ref 0.2–1.2)
Total Protein: 6.4 g/dL (ref 6.1–8.1)

## 2019-12-05 LAB — CBC
HCT: 37.9 % (ref 35.0–45.0)
Hemoglobin: 12.8 g/dL (ref 11.7–15.5)
MCH: 33.3 pg — ABNORMAL HIGH (ref 27.0–33.0)
MCHC: 33.8 g/dL (ref 32.0–36.0)
MCV: 98.7 fL (ref 80.0–100.0)
MPV: 10 fL (ref 7.5–12.5)
Platelets: 241 10*3/uL (ref 140–400)
RBC: 3.84 10*6/uL (ref 3.80–5.10)
RDW: 13.4 % (ref 11.0–15.0)
WBC: 6 10*3/uL (ref 3.8–10.8)

## 2019-12-05 LAB — HIV-1 RNA QUANT-NO REFLEX-BLD
HIV 1 RNA Quant: 30 copies/mL — ABNORMAL HIGH
HIV-1 RNA Quant, Log: 1.48 Log copies/mL — ABNORMAL HIGH

## 2019-12-05 LAB — LIPID PANEL
Cholesterol: 242 mg/dL — ABNORMAL HIGH (ref <200)
HDL: 60 mg/dL (ref >=50)
LDL Cholesterol (Calc): 146 mg/dL (calc) — ABNORMAL HIGH
Non-HDL Cholesterol (Calc): 182 mg/dL (calc) — ABNORMAL HIGH (ref <130)
Total CHOL/HDL Ratio: 4 (calc) (ref <5.0)
Triglycerides: 221 mg/dL — ABNORMAL HIGH (ref <150)

## 2019-12-05 LAB — RPR: RPR Ser Ql: NONREACTIVE

## 2019-12-06 MED FILL — BIKTARVY 50-200-25 MG TABS: 50-200-25 | 30 days supply | Qty: 30 | Fill #10

## 2019-12-18 ENCOUNTER — Other Ambulatory Visit: Payer: Self-pay

## 2019-12-18 ENCOUNTER — Encounter: Payer: Self-pay | Admitting: Infectious Diseases

## 2019-12-18 ENCOUNTER — Other Ambulatory Visit (HOSPITAL_COMMUNITY)
Admission: RE | Admit: 2019-12-18 | Discharge: 2019-12-18 | Disposition: A | Discharge status: Home / Self Care | Payer: Medicare Other | Source: Ambulatory Visit | Attending: Infectious Diseases | Admitting: Infectious Diseases

## 2019-12-18 ENCOUNTER — Ambulatory Visit (INDEPENDENT_AMBULATORY_CARE_PROVIDER_SITE_OTHER): Payer: Medicare Other | Admitting: Infectious Diseases

## 2019-12-18 VITALS — BP 166/101 | HR 75 | Wt 217.0 lb

## 2019-12-18 DIAGNOSIS — R87810 Cervical high risk human papillomavirus (HPV) DNA test positive: Secondary | ICD-10-CM | POA: Insufficient documentation

## 2019-12-18 DIAGNOSIS — Z113 Encounter for screening for infections with a predominantly sexual mode of transmission: Secondary | ICD-10-CM | POA: Insufficient documentation

## 2019-12-18 DIAGNOSIS — M25511 Pain in right shoulder: Secondary | ICD-10-CM | POA: Diagnosis not present

## 2019-12-18 DIAGNOSIS — A630 Anogenital (venereal) warts: Secondary | ICD-10-CM

## 2019-12-18 DIAGNOSIS — I10 Essential (primary) hypertension: Secondary | ICD-10-CM | POA: Diagnosis present

## 2019-12-18 DIAGNOSIS — B20 Human immunodeficiency virus [HIV] disease: Secondary | ICD-10-CM

## 2019-12-18 DIAGNOSIS — N951 Menopausal and female climacteric states: Secondary | ICD-10-CM

## 2019-12-18 DIAGNOSIS — Z21 Asymptomatic human immunodeficiency virus [HIV] infection status: Secondary | ICD-10-CM | POA: Diagnosis not present

## 2019-12-18 DIAGNOSIS — R87612 Low grade squamous intraepithelial lesion on cytologic smear of cervix (LGSIL): Secondary | ICD-10-CM | POA: Diagnosis not present

## 2019-12-18 DIAGNOSIS — F17200 Nicotine dependence, unspecified, uncomplicated: Secondary | ICD-10-CM

## 2019-12-18 DIAGNOSIS — Z1151 Encounter for screening for human papillomavirus (HPV): Secondary | ICD-10-CM | POA: Insufficient documentation

## 2019-12-18 DIAGNOSIS — Z79899 Other long term (current) drug therapy: Secondary | ICD-10-CM | POA: Diagnosis not present

## 2019-12-18 DIAGNOSIS — Z124 Encounter for screening for malignant neoplasm of cervix: Secondary | ICD-10-CM

## 2019-12-18 DIAGNOSIS — F1010 Alcohol abuse, uncomplicated: Secondary | ICD-10-CM

## 2019-12-18 DIAGNOSIS — N890 Mild vaginal dysplasia: Secondary | ICD-10-CM

## 2019-12-18 DIAGNOSIS — F1410 Cocaine abuse, uncomplicated: Secondary | ICD-10-CM

## 2019-12-18 NOTE — Assessment & Plan Note (Signed)
Encouraged to quit. 

## 2019-12-18 NOTE — Assessment & Plan Note (Addendum)
She attributes to her diet, drinking.  She attributes to stress as well.  Second elevated consecutively.  Will recheck at her f/u.  Has established PCP in [place].

## 2019-12-18 NOTE — Progress Notes (Signed)
Subjective:    Lori [name] is a 49 y.o. female here for an annual pelvic exam and pap smear.    Review of Systems: Current GYN complaints or concerns: No concerns.  She had a hysterectomy for non-oncogenic reasons in the past.  Previously had a LEEP procedure prior to hysterectomy.   She knows she needs to see someone for her rectal prolapse and genital warts but is worried about the pain it is going to cause.   Has been drinking alcohol most nights during the week to sleep. She does not think this is a problem yet but keeping an eye on it.   She wonders if she is going through menopause - irritable, sweating/hot flashes throughout the day, difficulty sleeping. She is not sexually active at present.   Past Medical History:  Diagnosis Date  . Abnormal Pap smear    Had Cryo to treat  . Allergy   . Anemia   . Anxiety   . Cluster headaches   . HIV infection ([facility])   . Lung nodule   . Vaginal venereal warts     Gynecologic History: [id number]  No LMP recorded. Patient has had a hysterectomy. Contraception: status post hysterectomy Last Pap: 06/2017. Results were: normal Anal Intercourse: Yes Last Mammogram: 2019. Results were: normal  Objective:  Physical Exam  Constitutional: Well developed, well nourished, no acute distress. She is alert and oriented x3.  Pelvic: External genitalia reveals a few flat condyloma scattered on mons pubis but other wise w/o open lesions/sores. Normal hair pattern distribution. The vagina is normal in appearance. The vaginal cuff is normal in appearance with exception of a small polyp in the 10 o'clock position - stable from previous years exam. The cervix is surgically absent.  There is no discharge in vault  Rectum: External exam does not reveal any prolapsed organ  Psych: She has a normal mood and affect.     Assessment:  Normal pelvic exam Normal vaginal cuff with 1 small polyp Perimenopause symptoms Well controlled HIV    Plan:  Health Maintenance =   Vaginal cytology and HPV obtained today  Results will be communicated to the patient via my chart  Polyp appears stable in size. Follow up with annual exams for now  She has been counseled and instructed how to perform monthly self breast exams.  Screening mammogram to be scheduled this year   Perimenopause =  Discussed Black Cohosh for symptom control  Alternatives could include Efexor for hot flashes  HRT if symptoms become severe - will refer to GYN if that is the case.    Perirectal Warts =   Discussed importance of going to get these assessed. She really needs anoscopy also given suspicion for internal involvement and cancer risk. She will consider in the net 1-2 months; fearful as of now.   Contraception / Family Planning =   Status post hysterectomy  Encopresis =   She will reschedule her follow up appointment with GI for colonoscopy.  She may require surgical referral for colorectal intervention.  HIV =   She will continue her Biktarvy and F/U as scheduled with Dr. [name] for ongoing HIV care.   Insomnia =   Having a hard time sleeping - encouraged to lay off alcohol with her addiction history.   Melatonin 5-10 mg QHS to see if this helps her     Lori Alberts, MSN, NP-C [facility] for Infectious Disease [facility]  [email or web address]  Pager: [phone] Office: [phone] [facility] Main Line: [phone]    12/18/19 12:02 PM

## 2019-12-18 NOTE — Assessment & Plan Note (Signed)
Appreciate NP Dixon's f/u.  

## 2019-12-18 NOTE — Assessment & Plan Note (Signed)
Not currently active.  

## 2019-12-18 NOTE — Progress Notes (Signed)
   Subjective:    Patient ID: Lori [name], female    DOB: 1971/01/26, 49 y.o.   MRN: [id number]  HPI 49yo F with HIV+ (dx 2004), anxiety d/o, anal/vaginal warts, AIN1 (HPV+), tobacco use.  Previously onatripla (made her depressed suicidal). tried odefsy but couldn't tolerate. Then changed to genvoya which she went off of . States it makes her throw up.At her f/u 11-2017, she was changed to biktarvy.  Had PAP today. Previous hysterectomy. Mammo 04-2018.  Son [name], born 2004, HIV -. Oldest son in prison ("baned from all the [place] property... until he learns to keep his hands to himself"). Brother living with her now as well.  Has been gaining wt but is becoming more active.  Seen in ED 08-2019 for shoulder pain- no films done.  Thinks she is in menopause, having sweats, feels fatigued.   Has been drinking more, at home, not driving. No cocaine. Having a hard time coping with having to take care of her brother (o/w her parents would have to take care of him). He is bipolar, hypertensive, no driver's lisc.    She had tele-eval for rectal prolapse, states she will be seen in next month.   HIV 1 RNA Quant (copies/mL)  Date Value  12/03/2019 30 (H)  11/13/2018 <20 NOT DETECTED  01/24/2018 <20 NOT DETECTED   CD4 T Cell Abs (/uL)  Date Value  12/03/2019 503  11/13/2018 664  01/24/2018 740    Review of Systems  Constitutional: Positive for fatigue. Negative for appetite change, chills, fever and unexpected weight change.  Respiratory: Positive for cough and shortness of breath.   Gastrointestinal: Negative for anal bleeding, constipation and diarrhea.  Genitourinary: Negative for difficulty urinating and menstrual problem.  attributes SOB to wt gain. Still smoking 1ppd, marijuana as well.  Please see HPI. All other systems reviewed and negative.      Objective:   Physical Exam Vitals reviewed.  HENT:     Mouth/Throat:     Mouth: Mucous membranes are moist.      Pharynx: No oropharyngeal exudate.  Eyes:     Extraocular Movements: Extraocular movements intact.     Pupils: Pupils are equal, round, and reactive to light.  Cardiovascular:     Rate and Rhythm: Normal rate and regular rhythm.  Pulmonary:     Effort: Pulmonary effort is normal.     Breath sounds: Normal breath sounds.  Abdominal:     General: Bowel sounds are normal. There is no distension.     Palpations: Abdomen is soft.     Tenderness: There is no abdominal tenderness.  Musculoskeletal:        General: Normal range of motion.     Cervical back: Normal range of motion and neck supple.     Right lower leg: No edema.     Left lower leg: No edema.  Neurological:     Mental Status: She is alert.           Assessment & Plan:

## 2019-12-18 NOTE — Assessment & Plan Note (Signed)
Doing well Will continue biktarvy Given condoms Has gotten covid vax.  rtc in 9 months.

## 2019-12-18 NOTE — Assessment & Plan Note (Addendum)
Had fallen Has improved. ROM better.  Tylenol, ibuprofen prn.

## 2019-12-18 NOTE — Patient Instructions (Addendum)
For sleep try MELATONIN 5 - 10 mg. If you have a chewable or gummy take 60 minutes before bed, if in a capsule take 2-3 hours before bed to help you sleep.   Black Cohosh may be helpful taken daily for your menopause symptoms  If you find your symptoms are getting bad I would like to set you up with Gynecology to talk about hormone replacement therapy - see the information below.    Menopause and Hormone Replacement Therapy Menopause is a normal time of life when menstrual periods stop completely and the ovaries stop producing the female hormones estrogen and progesterone. This lack of hormones can affect your health and cause undesirable symptoms. Hormone replacement therapy (HRT) can relieve some of those symptoms. What is hormone replacement therapy? HRT is the use of artificial (synthetic) hormones to replace hormones that your body has stopped producing because you have reached menopause. What are my options for HRT?  HRT may consist of the synthetic hormones estrogen and progestin, or it may consist of only estrogen (estrogen-only therapy). You and your health care provider will decide which form of HRT is best for you. If you choose to be on HRT and you have a uterus, estrogen and progestin are usually prescribed. Estrogen-only therapy is used for women who do not have a uterus. Possible options for taking HRT include:  Pills.  Patches.  Gels.  Sprays.  Vaginal cream.  Vaginal rings.  Vaginal inserts. The amount of hormone(s) that you take and how long you take the hormone(s) varies according to your health. It is important to:  Begin HRT with the lowest possible dosage.  Stop HRT as soon as your health care provider tells you to stop.  Work with your health care provider so that you feel informed and comfortable with your decisions. What are the benefits of HRT? HRT can reduce the frequency and severity of menopausal symptoms. Benefits of HRT vary according to the kind  of symptoms that you have, how severe they are, and your overall health. HRT may help to improve the following symptoms of menopause:  Hot flashes and night sweats. These are sudden feelings of heat that spread over the face and body. The skin may turn red, like a blush. Night sweats are hot flashes that happen while you are sleeping or trying to sleep.  Bone loss (osteoporosis). The body loses calcium more quickly after menopause, causing the bones to become weaker. This can increase the risk for bone breaks (fractures).  Vaginal dryness. The lining of the vagina can become thin and dry, which can cause pain during sex or cause infection, burning, or itching.  Urinary tract infections.  Urinary incontinence. This is the inability to control when you pass urine.  Irritability.  Short-term memory problems. What are the risks of HRT? Risks of HRT vary depending on your individual health and medical history. Risks of HRT also depend on whether you receive both estrogen and progestin or you receive estrogen only. HRT may increase the risk of:  Spotting. This is when a small amount of blood leaks from the vagina unexpectedly.  Endometrial cancer. This cancer is in the lining of the uterus (endometrium).  Breast cancer.  Increased density of breast tissue. This can make it harder to find breast cancer on a breast X-ray (mammogram).  Stroke.  Heart disease.  Blood clots.  Gallbladder disease.  Liver disease. Risks of HRT can increase if you have any of the following conditions:  Endometrial cancer.  Liver disease.  Heart disease.  Breast cancer.  History of blood clots.  History of stroke. Follow these instructions at home:  Take over-the-counter and prescription medicines only as told by your health care provider.  Get mammograms, pelvic exams, and medical checkups as often as told by your health care provider.  Have Pap tests done as often as told by your health care  provider. A Pap test is sometimes called a Pap smear. It is a screening test that is used to check for signs of cancer of the cervix and vagina. A Pap test can also identify the presence of infection or precancerous changes. Pap tests may be done: ? Every 3 years, starting at age 60. ? Every 5 years, starting after age 57, in combination with testing for human papillomavirus (HPV). ? More often or less often depending on other medical conditions you have, your age, and other risk factors.  It is up to you to get the results of your Pap test. Ask your health care provider, or the department that is doing the test, when your results will be ready.  Keep all follow-up visits as told by your health care provider. This is important. Contact a health care provider if you have:  Pain or swelling in your legs.  Shortness of breath.  Chest pain.  Lumps or changes in your breasts or armpits.  Slurred speech.  Pain, burning, or bleeding when you urinate.  Unusual vaginal bleeding.  Dizziness or headaches.  Weakness or numbness in any part of your arms or legs.  Pain in your abdomen. Summary  Menopause is a normal time of life when menstrual periods stop completely and the ovaries stop producing the female hormones estrogen and progesterone.  Hormone replacement therapy (HRT) can relieve some of the symptoms of menopause.  HRT can reduce the frequency and severity of menopausal symptoms.  Risks of HRT vary depending on your individual health and medical history. This information is not intended to replace advice given to you by your health care provider. Make sure you discuss any questions you have with your health care provider. Document Revised: 01/31/2018 Document Reviewed: 01/31/2018 Elsevier Patient Education  2020 [employer].

## 2019-12-18 NOTE — Assessment & Plan Note (Signed)
Encouraged to moderate.

## 2019-12-19 LAB — URINE CYTOLOGY ANCILLARY ONLY
Chlamydia: NEGATIVE
Comment: NEGATIVE
Comment: NORMAL
Neisseria Gonorrhea: NEGATIVE

## 2019-12-21 LAB — CYTOLOGY - PAP
Comment: NEGATIVE
Comment: NEGATIVE
HPV 16: NEGATIVE
HPV 18 / 45: NEGATIVE
High risk HPV: POSITIVE — AB

## 2019-12-24 ENCOUNTER — Telehealth: Payer: Self-pay

## 2019-12-24 NOTE — Telephone Encounter (Signed)
Patient called requesting referral to Gynecology (informed patient that it's been placed) and requested Lori [name] call her to discuss results and referral. Patient aware that provider is out of the office and is willing to wait until she returns to talk.   Ishita Mcnerney Loyola Mast, RN

## 2019-12-24 NOTE — Addendum Note (Signed)
Addended by: Blanchard Kelch on: 12/24/2019 10:47 AM   Modules accepted: Orders

## 2020-01-03 DIAGNOSIS — Z23 Encounter for immunization: Secondary | ICD-10-CM | POA: Diagnosis not present

## 2020-01-07 NOTE — Telephone Encounter (Signed)
Patient called to discuss HPV(+) / LSIL cytology on recent pap smear.  Questions and concerns addressed - she has an appointment with GYN Monday

## 2020-01-10 MED FILL — BIKTARVY 50-200-25 MG TABS: 50-200-25 | 30 days supply | Qty: 30 | Fill #11

## 2020-01-14 ENCOUNTER — Encounter: Payer: Medicare Other | Admitting: Obstetrics & Gynecology

## 2020-02-04 ENCOUNTER — Encounter: Payer: Self-pay | Admitting: Obstetrics & Gynecology

## 2020-02-04 ENCOUNTER — Ambulatory Visit (INDEPENDENT_AMBULATORY_CARE_PROVIDER_SITE_OTHER): Payer: Medicare Other | Admitting: Obstetrics & Gynecology

## 2020-02-04 VITALS — BP 171/110 | HR 79 | Ht 64.0 in

## 2020-02-04 DIAGNOSIS — N893 Dysplasia of vagina, unspecified: Secondary | ICD-10-CM | POA: Diagnosis not present

## 2020-02-04 DIAGNOSIS — Z21 Asymptomatic human immunodeficiency virus [HIV] infection status: Secondary | ICD-10-CM

## 2020-02-04 NOTE — Progress Notes (Signed)
Chief Complaint  Patient presents with  . Abnormal Pap Smear    vaginal pap      49 y.o. [id number] No LMP recorded. Patient has had a hysterectomy. The current method of family planning is status post hysterectomy.  Outpatient Encounter Medications as of 02/04/2020  Medication Sig  . acetaminophen (TYLENOL) 325 MG tablet Take 650 mg by mouth every 6 (six) hours as needed.  Marland Kitchen BIKTARVY 50-200-25 MG TABS tablet TAKE 1 TABLET BY MOUTH DAILY.  Marland Kitchen ibuprofen (ADVIL) 200 MG tablet Take 200 mg by mouth every 6 (six) hours as needed for moderate pain.  . cyclobenzaprine (FLEXERIL) 10 MG tablet Take 1 tablet (10 mg total) by mouth at bedtime. (Patient not taking: Reported on 12/18/2019)  . [DISCONTINUED] diphenhydramine-acetaminophen (TYLENOL PM) 25-500 MG TABS tablet Take 1 tablet by mouth at bedtime as needed (sleep).  . [DISCONTINUED] fluticasone (FLONASE) 50 MCG/ACT nasal spray Place 2 sprays into both nostrils daily.   No facility-administered encounter medications on file as of 02/04/2020.    Subjective Pt is HIV positive w/latest viral load of 30 Some years undetectable Chronic LSL HPV issues w/vag cytology  Review of data shows risks of vaginal cancer is low approaches non HIV patients with viral loads <10,000   Past Medical History:  Diagnosis Date  . Abnormal Pap smear    Had Cryo to treat  . Allergy   . Anemia   . Anxiety   . Cluster headaches   . HIV infection (HCC)   . Lung nodule   . Vaginal venereal warts     Past Surgical History:  Procedure Laterality Date  . ABDOMINAL HYSTERECTOMY    . KNEE ARTHROSCOPY Left    bakers cyst removal  . TUBAL LIGATION      OB History    Gravida  7   Para  2   Term  2   Preterm      AB  5   Living  2     SAB  2   TAB  3   Ectopic      Multiple      Live Births              Allergies  Allergen Reactions  . Doxycycline Swelling  . Other Swelling    Mulberry fruit   . Sulfonamide Derivatives Other  (See Comments)    Childhood allergy.    Social History   Socioeconomic History  . Marital status: Single    Spouse name: Not on file  . Number of children: Not on file  . Years of education: Not on file  . Highest education level: Not on file  Occupational History  . Not on file  Tobacco Use  . Smoking status: Current Every Day Smoker    Packs/day: 1.00    Years: 27.00    Pack years: 27.00    Types: Cigarettes  . Smokeless tobacco: Never Used  . Tobacco comment: patient not ready to quit; considering patches  Vaping Use  . Vaping Use: Never used  Substance and Sexual Activity  . Alcohol use: Yes    Alcohol/week: 0.0 standard drinks    Comment: pint occ.  . Drug use: Yes    Frequency: 7.0 times per week    Types: Marijuana, Cocaine    Comment: not currently doing cocaine= Marijuana  . Sexual activity: Yes    Birth control/protection: Surgical, Condom    Comment: pt. declined condoms  Other Topics  Concern  . Not on file  Social History Narrative  . Not on file   Social Determinants of Health   Financial Resource Strain:   . Difficulty of Paying Living Expenses: Not on file  Food Insecurity:   . Worried About Programme researcher, broadcasting/film/video in the Last Year: Not on file  . Ran Out of Food in the Last Year: Not on file  Transportation Needs:   . Lack of Transportation (Medical): Not on file  . Lack of Transportation (Non-Medical): Not on file  Physical Activity:   . Days of Exercise per Week: Not on file  . Minutes of Exercise per Session: Not on file  Stress:   . Feeling of Stress : Not on file  Social Connections:   . Frequency of Communication with Friends and Family: Not on file  . Frequency of Social Gatherings with Friends and Family: Not on file  . Attends Religious Services: Not on file  . Active Member of Clubs or Organizations: Not on file  . Attends Banker Meetings: Not on file  . Marital Status: Not on file    Family History  Problem Relation  Age of Onset  . CAD Mother        MI 2016  . Uterine cancer Mother   . Cervical cancer Mother   . Colon cancer Mother   . Lung cancer Maternal Grandmother   . Cancer Maternal Grandfather        larynx  . Prostate cancer Maternal Grandfather   . Prostate cancer Father   . Breast cancer Neg Hx   . Liver disease Neg Hx   . Esophageal cancer Neg Hx   . Stomach cancer Neg Hx   . Colon polyps Neg Hx     Medications:       Current Outpatient Medications:  .  acetaminophen (TYLENOL) 325 MG tablet, Take 650 mg by mouth every 6 (six) hours as needed., Disp: , Rfl:  .  BIKTARVY 50-200-25 MG TABS tablet, TAKE 1 TABLET BY MOUTH DAILY., Disp: 90 tablet, Rfl: 3 .  ibuprofen (ADVIL) 200 MG tablet, Take 200 mg by mouth every 6 (six) hours as needed for moderate pain., Disp: , Rfl:  .  cyclobenzaprine (FLEXERIL) 10 MG tablet, Take 1 tablet (10 mg total) by mouth at bedtime. (Patient not taking: Reported on 12/18/2019), Disp: 15 tablet, Rfl: 0  Objective Blood pressure (!) 171/110, pulse 79, height 5\' 4"  (1.626 m).  Gen WDWN NAD  Pertinent ROS No burning with urination, frequency or urgency No nausea, vomiting or diarrhea Nor fever chills or other constitutional symptoms   Labs or studies Reviewed her labs    Impression Diagnoses this Encounter::   ICD-10-CM   1. Vaginal dysplasia, s/p hysterectomy for period issues, HIV positive  N89.3    low to no detectable viral loads  2. HIV positive (HCC)  Z21     Established relevant diagnosis(es):   Plan/Recommendations: No orders of the defined types were placed in this encounter.   Labs or Scans Ordered: No orders of the defined types were placed in this encounter.   Management:: Recommend yearly screening for now and vaginal colposcopy if HSIL is identified  With low to no viral loads on labs, risks of vaginal dysplasia/cancer is nearly normal as compared to normals  Follow up No follow-ups on file.        Face to face  time:  20 minutes  Greater than 50% of the visit time  was spent in counseling and coordination of care with the patient.  The summary and outline of the counseling and care coordination is summarized in the note above.   All questions were answered.

## 2020-02-08 ENCOUNTER — Other Ambulatory Visit: Payer: Self-pay | Admitting: Infectious Diseases

## 2020-02-08 DIAGNOSIS — B20 Human immunodeficiency virus [HIV] disease: Secondary | ICD-10-CM

## 2020-02-11 MED FILL — BIKTARVY 50-200-25 MG TABS: 50-200-25 | 30 days supply | Qty: 30 | Fill #0

## 2020-03-28 MED FILL — BIKTARVY 50-200-25 MG TABS: 50-200-25 | 30 days supply | Qty: 30 | Fill #1

## 2020-05-05 MED FILL — BIKTARVY 50-200-25 MG TABS: 50-200-25 | 30 days supply | Qty: 30 | Fill #2

## 2020-05-27 ENCOUNTER — Other Ambulatory Visit: Payer: Self-pay | Admitting: Infectious Diseases

## 2020-05-27 DIAGNOSIS — B20 Human immunodeficiency virus [HIV] disease: Secondary | ICD-10-CM

## 2020-05-29 MED FILL — BIKTARVY 50-200-25 MG TABS: 50-200-25 | 30 days supply | Qty: 30 | Fill #0

## 2020-07-01 MED FILL — BIKTARVY 50-200-25 MG TABS: 50-200-25 | 30 days supply | Qty: 30 | Fill #1

## 2020-08-06 MED FILL — BIKTARVY 50-200-25 MG TABS: 50-200-25 | 30 days supply | Qty: 30 | Fill #2

## 2020-09-04 MED FILL — BIKTARVY 50-200-25 MG TABS: 50-200-25 | 30 days supply | Qty: 30 | Fill #3

## 2020-09-05 ENCOUNTER — Other Ambulatory Visit (HOSPITAL_COMMUNITY): Payer: Self-pay

## 2020-09-29 ENCOUNTER — Other Ambulatory Visit (HOSPITAL_COMMUNITY): Payer: Self-pay

## 2020-10-01 ENCOUNTER — Other Ambulatory Visit (HOSPITAL_COMMUNITY): Payer: Self-pay

## 2020-10-03 ENCOUNTER — Other Ambulatory Visit (HOSPITAL_COMMUNITY): Payer: Self-pay

## 2020-10-03 MED FILL — Bictegravir-Emtricitabine-Tenofovir AF Tab 50-200-25 MG: ORAL | 30 days supply | Qty: 30 | Fill #0 | Status: AC

## 2020-10-08 ENCOUNTER — Other Ambulatory Visit (HOSPITAL_COMMUNITY): Payer: Self-pay

## 2020-10-30 ENCOUNTER — Encounter: Payer: Self-pay | Admitting: Infectious Diseases

## 2020-10-30 ENCOUNTER — Ambulatory Visit (INDEPENDENT_AMBULATORY_CARE_PROVIDER_SITE_OTHER): Payer: Medicare Other | Admitting: Infectious Diseases

## 2020-10-30 ENCOUNTER — Other Ambulatory Visit: Payer: Self-pay

## 2020-10-30 ENCOUNTER — Other Ambulatory Visit (HOSPITAL_COMMUNITY)
Admission: RE | Admit: 2020-10-30 | Discharge: 2020-10-30 | Disposition: A | Discharge status: Home / Self Care | Payer: Medicare Other | Source: Ambulatory Visit | Attending: Infectious Diseases | Admitting: Infectious Diseases

## 2020-10-30 VITALS — BP 152/101 | HR 94 | Temp 98.3°F

## 2020-10-30 DIAGNOSIS — Z79899 Other long term (current) drug therapy: Secondary | ICD-10-CM

## 2020-10-30 DIAGNOSIS — W57XXXA Bitten or stung by nonvenomous insect and other nonvenomous arthropods, initial encounter: Secondary | ICD-10-CM | POA: Insufficient documentation

## 2020-10-30 DIAGNOSIS — S30860A Insect bite (nonvenomous) of lower back and pelvis, initial encounter: Secondary | ICD-10-CM | POA: Diagnosis not present

## 2020-10-30 DIAGNOSIS — B20 Human immunodeficiency virus [HIV] disease: Secondary | ICD-10-CM | POA: Diagnosis not present

## 2020-10-30 DIAGNOSIS — J019 Acute sinusitis, unspecified: Secondary | ICD-10-CM

## 2020-10-30 DIAGNOSIS — F1410 Cocaine abuse, uncomplicated: Secondary | ICD-10-CM

## 2020-10-30 DIAGNOSIS — Z113 Encounter for screening for infections with a predominantly sexual mode of transmission: Secondary | ICD-10-CM | POA: Diagnosis not present

## 2020-10-30 DIAGNOSIS — I10 Essential (primary) hypertension: Secondary | ICD-10-CM | POA: Diagnosis not present

## 2020-10-30 DIAGNOSIS — F1010 Alcohol abuse, uncomplicated: Secondary | ICD-10-CM

## 2020-10-30 DIAGNOSIS — F17200 Nicotine dependence, unspecified, uncomplicated: Secondary | ICD-10-CM

## 2020-10-30 MED ORDER — LISINOPRIL 2.5 MG PO TABS: 2.5000 mg | ORAL_TABLET | Freq: Every day | ORAL | 3 refills | Status: DC

## 2020-10-30 MED ORDER — FLUTICASONE PROPIONATE 50 MCG/ACT NA SUSP: 2.0000 | Freq: Every day | NASAL | 2 refills | Status: DC

## 2020-10-30 NOTE — Addendum Note (Signed)
Addended by: Nicasio Barlowe C on: 10/30/2020 04:32 PM   Modules accepted: Orders

## 2020-10-30 NOTE — Assessment & Plan Note (Signed)
She has no sinus tenderness but does have ear effusions.  Will give her flonase.

## 2020-10-30 NOTE — Assessment & Plan Note (Signed)
Encouraged to quit. Very difficult with her mental illness.

## 2020-10-30 NOTE — Assessment & Plan Note (Addendum)
Will check her labs today Has gotten her COVID vax and booster #1. Needs 2nd booster.  given condoms.  Will see her back in 1 month.

## 2020-10-30 NOTE — Progress Notes (Signed)
Subjective:    Patient ID: Lori [name], female  DOB: [birth year], 50 y.o.        MRN: [id number]   HPI 50yo F with HIV+(dx 2004), anxiety d/o, anal/vaginal warts, AIN1 (HPV+), tobacco use.  Previously onatripla (made her depressed suicidal). tried odefsy but couldn't tolerate. Then changed to genvoya which she went off of . States it makes her throw up.At her f/u 11-2017, she was changed to biktarvy.denies missed doses.   Son [name], born 2004, HIV -. Oldest son in prison ("baned from all the [place] property... until he learns to keep his hands to himself"). Brother living with her now as well.    Thinks she is in menopause, having sweats, feels fatigued.   Has been drinking more (but same as 6 months ago). 1/5 a day.  Taking care of brother- "we are basically room mates at this point". He is bipolar, hypertensive, had 2 strokes.   Worried she is "sick" today. Has HTN, feels like she is having fevers, sweating, coughing with pinkish sputum. "I feel horrible".    HIV 1 RNA Quant (copies/mL)  Date Value  12/03/2019 30 (H)  11/13/2018 <20 NOT DETECTED  01/24/2018 <20 NOT DETECTED   CD4 T Cell Abs (/uL)  Date Value  12/03/2019 503  11/13/2018 664  01/24/2018 740     Health Maintenance  Topic Date Due  . COLONOSCOPY (Pts 45-78yrs Insurance coverage will need to be confirmed)  Never done  . COVID-19 Vaccine (3 - Janssen risk 3-dose series) 06/10/2020  . PAP SMEAR-Modifier  12/17/2020  . INFLUENZA VACCINE  01/12/2021  . TETANUS/TDAP  11/17/2027  . Hepatitis C Screening  Completed  . HIV Screening  Completed  . HPV VACCINES  Aged Out   She had tick bite 5-12 in her L groin, still has a scab from this.    Review of Systems  Constitutional: Positive for diaphoresis. Negative for chills and fever.  HENT: Positive for congestion and sinus pain.   Musculoskeletal: Positive for myalgias.  Neurological: Positive for headaches.  Psychiatric/Behavioral: The patient  is nervous/anxious.     Please see HPI. All other systems reviewed and negative.     Objective:  Physical Exam Vitals reviewed.  Constitutional:      Appearance: Normal appearance. She is obese.  HENT:     Right Ear: Ear canal and external ear normal. A middle ear effusion is present. There is no impacted cerumen. Tympanic membrane is retracted.     Left Ear: Ear canal and external ear normal. A middle ear effusion is present. There is no impacted cerumen. Tympanic membrane is retracted.     Mouth/Throat:     Mouth: Mucous membranes are moist.     Pharynx: No oropharyngeal exudate.  Eyes:     Extraocular Movements: Extraocular movements intact.     Pupils: Pupils are equal, round, and reactive to light.  Cardiovascular:     Rate and Rhythm: Normal rate and regular rhythm.  Pulmonary:     Effort: Pulmonary effort is normal.     Breath sounds: Normal breath sounds.  Abdominal:     General: Bowel sounds are normal. There is no distension.     Palpations: Abdomen is soft.     Tenderness: There is no abdominal tenderness.  Musculoskeletal:     Cervical back: Normal range of motion and neck supple.     Right lower leg: No edema.     Left lower leg: No edema.  Neurological:  General: No focal deficit present.     Mental Status: She is alert.  Psychiatric:        Mood and Affect: Mood is anxious.            Assessment & Plan:

## 2020-10-30 NOTE — Assessment & Plan Note (Signed)
She continues to use up to a 1/5 a day.  Encouraged her that her current sx may be related to this.

## 2020-10-30 NOTE — Assessment & Plan Note (Addendum)
Pt reports tick bite when her sx of sweating and general unwellness started.  Will give her anbx depending on results (she has doxy allergy).

## 2020-10-30 NOTE — Assessment & Plan Note (Signed)
Will start a low dose ACE-I.

## 2020-10-30 NOTE — Assessment & Plan Note (Signed)
She continues to use.  Encouraged her that her current sx may be related to this.

## 2020-10-31 LAB — T-HELPER CELL (CD4) - (RCID CLINIC ONLY)
CD4 % Helper T Cell: 32 % — ABNORMAL LOW (ref 33–65)
CD4 T Cell Abs: 570 /uL (ref 400–1790)

## 2020-11-02 LAB — LIPID PANEL
Cholesterol: 275 mg/dL — ABNORMAL HIGH (ref <200)
HDL: 55 mg/dL (ref >=50)
LDL Cholesterol (Calc): 172 mg/dL (calc) — ABNORMAL HIGH
Non-HDL Cholesterol (Calc): 220 mg/dL (calc) — ABNORMAL HIGH (ref <130)
Total CHOL/HDL Ratio: 5 (calc) — ABNORMAL HIGH (ref <5.0)
Triglycerides: 267 mg/dL — ABNORMAL HIGH (ref <150)

## 2020-11-02 LAB — CBC
HCT: 40.1 % (ref 35.0–45.0)
Hemoglobin: 13.6 g/dL (ref 11.7–15.5)
MCH: 34.8 pg — ABNORMAL HIGH (ref 27.0–33.0)
MCHC: 33.9 g/dL (ref 32.0–36.0)
MCV: 102.6 fL — ABNORMAL HIGH (ref 80.0–100.0)
MPV: 10.4 fL (ref 7.5–12.5)
Platelets: 261 10*3/uL (ref 140–400)
RBC: 3.91 10*6/uL (ref 3.80–5.10)
RDW: 12.6 % (ref 11.0–15.0)
WBC: 7.1 10*3/uL (ref 3.8–10.8)

## 2020-11-02 LAB — COMPREHENSIVE METABOLIC PANEL
AG Ratio: 2.1 (calc) (ref 1.0–2.5)
ALT: 17 U/L (ref 6–29)
AST: 18 U/L (ref 10–35)
Albumin: 4.6 g/dL (ref 3.6–5.1)
Alkaline phosphatase (APISO): 63 U/L (ref 31–125)
BUN/Creatinine Ratio: 16 (calc) (ref 6–22)
BUN: 18 mg/dL (ref 7–25)
CO2: 26 mmol/L (ref 20–32)
Calcium: 9.5 mg/dL (ref 8.6–10.2)
Chloride: 106 mmol/L (ref 98–110)
Creat: 1.16 mg/dL — ABNORMAL HIGH (ref 0.50–1.10)
Globulin: 2.2 g/dL (calc) (ref 1.9–3.7)
Glucose, Bld: 103 mg/dL — ABNORMAL HIGH (ref 65–99)
Potassium: 3.7 mmol/L (ref 3.5–5.3)
Sodium: 144 mmol/L (ref 135–146)
Total Bilirubin: 0.4 mg/dL (ref 0.2–1.2)
Total Protein: 6.8 g/dL (ref 6.1–8.1)

## 2020-11-02 LAB — RPR: RPR Ser Ql: NONREACTIVE

## 2020-11-02 LAB — HEPATITIS C ANTIBODY
Hepatitis C Ab: NONREACTIVE
SIGNAL TO CUT-OFF: 0 (ref <1.00)

## 2020-11-02 LAB — HIV-1 RNA QUANT-NO REFLEX-BLD
HIV 1 RNA Quant: NOT DETECTED Copies/mL
HIV-1 RNA Quant, Log: NOT DETECTED Log cps/mL

## 2020-11-03 ENCOUNTER — Telehealth: Payer: Self-pay

## 2020-11-03 LAB — URINE CYTOLOGY ANCILLARY ONLY
Chlamydia: NEGATIVE
Comment: NEGATIVE
Comment: NORMAL
Neisseria Gonorrhea: NEGATIVE

## 2020-11-03 NOTE — Telephone Encounter (Signed)
-----   Message from [name], MD sent at 11/03/2020  8:06 AM EDT ----- Can Mrs Gangi come back in for a repeat BMP Her creatinine has gone up.  Thanks  ----- Message ----- From: Janace Hoard Lab Results In Sent: 10/30/2020  11:04 PM EDT To: [name], MD

## 2020-11-03 NOTE — Telephone Encounter (Signed)
Called patient to schedule repeat BMP, no answer. Left HIPAA compliant voicemail on self-identified inbox requesting callback.   Sandie Ano, RN

## 2020-11-04 ENCOUNTER — Other Ambulatory Visit: Payer: Self-pay

## 2020-11-04 DIAGNOSIS — R748 Abnormal levels of other serum enzymes: Secondary | ICD-10-CM

## 2020-11-04 NOTE — Telephone Encounter (Signed)
I spoke with patient and advised her she will need to have her creatine level repeated due to it being elevated. Patient also wanted Dr. [name] to know she has cut back on her alcohol intake.  I spoke with Amy with patient's pcp office([facility])  and she stated patient could come and have labs redrawn there. I have also advised Amy that results will need to be faxed to our office. I have faxed the order to their office [phone] signed by Tammy Sours, NP.  Sacheen Arrasmith T Pricilla Loveless

## 2020-11-05 ENCOUNTER — Other Ambulatory Visit (HOSPITAL_COMMUNITY): Payer: Self-pay

## 2020-11-06 ENCOUNTER — Other Ambulatory Visit: Payer: Self-pay | Admitting: Family

## 2020-11-06 ENCOUNTER — Other Ambulatory Visit: Payer: Medicare Other

## 2020-11-06 DIAGNOSIS — R7989 Other specified abnormal findings of blood chemistry: Secondary | ICD-10-CM | POA: Diagnosis not present

## 2020-11-06 LAB — COMPLETE METABOLIC PANEL WITH GFR
AG Ratio: 2.3 (calc) (ref 1.0–2.5)
ALT: 18 U/L (ref 6–29)
AST: 20 U/L (ref 10–35)
Albumin: 4.5 g/dL (ref 3.6–5.1)
Alkaline phosphatase (APISO): 55 U/L (ref 31–125)
BUN: 15 mg/dL (ref 7–25)
CO2: 28 mmol/L (ref 20–32)
Calcium: 9.3 mg/dL (ref 8.6–10.2)
Chloride: 104 mmol/L (ref 98–110)
Creat: 0.81 mg/dL (ref 0.50–1.10)
GFR, Est African American: 99 mL/min/{1.73_m2} (ref >=60)
GFR, Est Non African American: 85 mL/min/{1.73_m2} (ref >=60)
Globulin: 2 g/dL (calc) (ref 1.9–3.7)
Glucose, Bld: 92 mg/dL (ref 65–99)
Potassium: 4 mmol/L (ref 3.5–5.3)
Sodium: 139 mmol/L (ref 135–146)
Total Bilirubin: 0.3 mg/dL (ref 0.2–1.2)
Total Protein: 6.5 g/dL (ref 6.1–8.1)

## 2020-11-06 LAB — EHRLICHIA ANTIBODY PANEL
E. CHAFFEENSIS AB IGG: <1:64 {titer}
E. CHAFFEENSIS AB IGM: <1:20 {titer}

## 2020-11-06 LAB — ROCKY MTN SPOTTED FVR ABS PNL(IGG+IGM)
RMSF IgG: NOT DETECTED
RMSF IgM: NOT DETECTED

## 2020-11-11 ENCOUNTER — Other Ambulatory Visit (HOSPITAL_COMMUNITY): Payer: Self-pay

## 2020-11-11 MED FILL — Bictegravir-Emtricitabine-Tenofovir AF Tab 50-200-25 MG: ORAL | 30 days supply | Qty: 30 | Fill #1 | Status: AC

## 2020-11-14 ENCOUNTER — Emergency Department
Admission: EM | Admit: 2020-11-14 | Discharge: 2020-11-15 | Disposition: A | Discharge status: Home / Self Care | Payer: Medicare Other | Attending: Emergency Medicine | Admitting: Emergency Medicine

## 2020-11-14 DIAGNOSIS — Z21 Asymptomatic human immunodeficiency virus [HIV] infection status: Secondary | ICD-10-CM | POA: Diagnosis not present

## 2020-11-14 DIAGNOSIS — F17210 Nicotine dependence, cigarettes, uncomplicated: Secondary | ICD-10-CM | POA: Insufficient documentation

## 2020-11-14 DIAGNOSIS — R45851 Suicidal ideations: Secondary | ICD-10-CM | POA: Insufficient documentation

## 2020-11-14 DIAGNOSIS — F1014 Alcohol abuse with alcohol-induced mood disorder: Secondary | ICD-10-CM | POA: Diagnosis not present

## 2020-11-14 DIAGNOSIS — Z20822 Contact with and (suspected) exposure to covid-19: Secondary | ICD-10-CM | POA: Diagnosis not present

## 2020-11-14 DIAGNOSIS — F411 Generalized anxiety disorder: Secondary | ICD-10-CM | POA: Diagnosis present

## 2020-11-14 DIAGNOSIS — Y906 Blood alcohol level of 120-199 mg/100 ml: Secondary | ICD-10-CM | POA: Diagnosis not present

## 2020-11-14 DIAGNOSIS — F1410 Cocaine abuse, uncomplicated: Secondary | ICD-10-CM | POA: Diagnosis present

## 2020-11-14 DIAGNOSIS — Z046 Encounter for general psychiatric examination, requested by authority: Secondary | ICD-10-CM | POA: Diagnosis present

## 2020-11-14 DIAGNOSIS — R404 Transient alteration of awareness: Secondary | ICD-10-CM | POA: Diagnosis not present

## 2020-11-14 LAB — CBC WITH DIFFERENTIAL/PLATELET
Abs Immature Granulocytes: 0.02 10*3/uL (ref 0.00–0.07)
Basophils Absolute: 0.1 10*3/uL (ref 0.0–0.1)
Basophils Relative: 1 %
Eosinophils Absolute: 0 10*3/uL (ref 0.0–0.5)
Eosinophils Relative: 1 %
HCT: 39.1 % (ref 36.0–46.0)
Hemoglobin: 14.1 g/dL (ref 12.0–15.0)
Immature Granulocytes: 0 %
Lymphocytes Relative: 34 %
Lymphs Abs: 2.7 10*3/uL (ref 0.7–4.0)
MCH: 35.8 pg — ABNORMAL HIGH (ref 26.0–34.0)
MCHC: 36.1 g/dL — ABNORMAL HIGH (ref 30.0–36.0)
MCV: 99.2 fL (ref 80.0–100.0)
Monocytes Absolute: 0.5 10*3/uL (ref 0.1–1.0)
Monocytes Relative: 7 %
Neutro Abs: 4.6 10*3/uL (ref 1.7–7.7)
Neutrophils Relative %: 57 %
Platelets: 286 10*3/uL (ref 150–400)
RBC: 3.94 MIL/uL (ref 3.87–5.11)
RDW: 12.3 % (ref 11.5–15.5)
WBC: 8 10*3/uL (ref 4.0–10.5)
nRBC: 0 % (ref 0.0–0.2)

## 2020-11-14 LAB — BASIC METABOLIC PANEL
Anion gap: 14 (ref 5–15)
BUN: 20 mg/dL (ref 6–20)
CO2: 22 mmol/L (ref 22–32)
Calcium: 9.4 mg/dL (ref 8.9–10.3)
Chloride: 107 mmol/L (ref 98–111)
Creatinine, Ser: 1.01 mg/dL — ABNORMAL HIGH (ref 0.44–1.00)
GFR, Estimated: >60 mL/min (ref >60)
Glucose, Bld: 90 mg/dL (ref 70–99)
Potassium: 3.6 mmol/L (ref 3.5–5.1)
Sodium: 143 mmol/L (ref 135–145)

## 2020-11-14 LAB — URINE DRUG SCREEN, QUALITATIVE (ARMC ONLY)
Amphetamines, Ur Screen: NOT DETECTED
Barbiturates, Ur Screen: NOT DETECTED
Benzodiazepine, Ur Scrn: NOT DETECTED
Cannabinoid 50 Ng, Ur ~~LOC~~: POSITIVE — AB
Cocaine Metabolite,Ur ~~LOC~~: POSITIVE — AB
MDMA (Ecstasy)Ur Screen: NOT DETECTED
Methadone Scn, Ur: NOT DETECTED
Opiate, Ur Screen: NOT DETECTED
Phencyclidine (PCP) Ur S: NOT DETECTED
Tricyclic, Ur Screen: POSITIVE — AB

## 2020-11-14 LAB — ACETAMINOPHEN LEVEL: Acetaminophen (Tylenol), Serum: 13 ug/mL (ref 10–30)

## 2020-11-14 LAB — SALICYLATE LEVEL: Salicylate Lvl: <7 mg/dL — ABNORMAL LOW (ref 7.0–30.0)

## 2020-11-14 LAB — ETHANOL: Alcohol, Ethyl (B): 133 mg/dL — ABNORMAL HIGH (ref <10)

## 2020-11-14 MED ORDER — CITALOPRAM HYDROBROMIDE 20 MG PO TABS
10.0000 mg | ORAL_TABLET | Freq: Every day | ORAL | Status: DC
  Administered 2020-11-14 – 2020-11-15 (×2): 10 mg via ORAL
  Filled 2020-11-14 (×2): qty 1

## 2020-11-14 MED ORDER — NICOTINE 21 MG/24HR TD PT24
21.0000 mg | MEDICATED_PATCH | Freq: Once | TRANSDERMAL | Status: AC
  Administered 2020-11-14: 21 mg via TRANSDERMAL
  Filled 2020-11-14: qty 1

## 2020-11-14 MED ORDER — LISINOPRIL 5 MG PO TABS
2.5000 mg | ORAL_TABLET | Freq: Every day | ORAL | Status: DC
  Administered 2020-11-14 – 2020-11-15 (×2): 2.5 mg via ORAL
  Filled 2020-11-14 (×2): qty 1

## 2020-11-14 MED ORDER — OXCARBAZEPINE 150 MG PO TABS
150.0000 mg | ORAL_TABLET | Freq: Two times a day (BID) | ORAL | Status: DC
  Administered 2020-11-14 – 2020-11-15 (×3): 150 mg via ORAL
  Filled 2020-11-14 (×5): qty 1

## 2020-11-14 MED ORDER — LORAZEPAM 1 MG PO TABS
1.0000 mg | ORAL_TABLET | Freq: Four times a day (QID) | ORAL | Status: DC | PRN
  Administered 2020-11-15: 1 mg via ORAL
  Filled 2020-11-14: qty 1

## 2020-11-14 MED ORDER — TRAZODONE HCL 50 MG PO TABS: 50.0000 mg | ORAL_TABLET | Freq: Every evening | ORAL | Status: DC | PRN

## 2020-11-14 MED ORDER — LORAZEPAM 1 MG PO TABS
1.0000 mg | ORAL_TABLET | Freq: Once | ORAL | Status: AC
  Administered 2020-11-14: 1 mg via ORAL
  Filled 2020-11-14: qty 1

## 2020-11-14 MED ORDER — NAPROXEN 500 MG PO TABS
500.0000 mg | ORAL_TABLET | Freq: Once | ORAL | Status: AC
  Administered 2020-11-14: 500 mg via ORAL
  Filled 2020-11-14: qty 1

## 2020-11-14 MED ORDER — BICTEGRAVIR-EMTRICITAB-TENOFOV 50-200-25 MG PO TABS
1.0000 | ORAL_TABLET | Freq: Every day | ORAL | Status: DC
  Administered 2020-11-14 – 2020-11-15 (×2): 1 via ORAL
  Filled 2020-11-14 (×2): qty 1

## 2020-11-14 MED ORDER — ACETAMINOPHEN 500 MG PO TABS
1000.0000 mg | ORAL_TABLET | Freq: Once | ORAL | Status: AC
  Administered 2020-11-14: 1000 mg via ORAL
  Filled 2020-11-14: qty 2

## 2020-11-14 MED ORDER — ACETAMINOPHEN 500 MG PO TABS: 1000.0000 mg | ORAL_TABLET | Freq: Once | ORAL | Status: DC

## 2020-11-14 NOTE — ED Provider Notes (Signed)
[facility] Emergency Department Provider Note   ____________________________________________   Event Date/Time   First MD Initiated Contact with Patient 11/14/20 1204     (approximate)  I have reviewed the triage vital signs and the nursing notes.   HISTORY  Chief Complaint Psychiatric Evaluation    HPI Lori [name] is a 50 y.o. female with past medical history of HIV, anxiety, and anemia who presents to the ED for psychiatric evaluation.  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  She denies jumping out of the vehicle or attempting to harm herself.  She does report feeling suicidal and states "I will take a bunch of pills if I leave here."  She admits to crack cocaine use today as well as alcohol consumption.  She denies daily alcohol consumption and denies any IV drug use.  She denies any medical complaints at this time.        Past Medical History:  Diagnosis Date  . Abnormal Pap smear    Had Cryo to treat  . Allergy   . Anemia   . Anxiety   . Cluster headaches   . HIV infection (HCC)   . Lung nodule   . Vaginal venereal warts     Patient Active Problem List   Diagnosis Date Noted  . Alcohol abuse with alcohol-induced mood disorder (HCC) 11/14/2020  . Tick bite 10/30/2020  . Alcohol abuse 12/18/2019  . Lung nodule 11/16/2017  . Cocaine abuse (HCC) 11/16/2017  . Syphilis 07/18/2017  . Essential hypertension 07/22/2014  . Migraine 10/31/2006  . Human immunodeficiency virus (HIV) disease (HCC) 06/27/2006  . ANXIETY DISORDER, GENERALIZED 06/27/2006  . TOBACCO ABUSE 06/27/2006    Past Surgical History:  Procedure Laterality Date  . ABDOMINAL HYSTERECTOMY    . KNEE ARTHROSCOPY Left    bakers cyst removal  . TUBAL  LIGATION      Prior to Admission medications   Medication Sig Start Date End Date Taking? Authorizing Provider  acetaminophen (TYLENOL) 325 MG tablet Take 650 mg by mouth every 6 (six) hours as needed.    [provider]  bictegravir-emtricitabine-tenofovir AF (BIKTARVY) 50-200-25 MG TABS tablet TAKE 1 TABLET BY MOUTH DAILY. 05/27/20 05/27/21  [name], MD  cyclobenzaprine (FLEXERIL) 10 MG tablet Take 1 tablet (10 mg total) by mouth at bedtime. Patient not taking: Reported on 11/14/2020 08/23/19   [place], [place], PA-C  fluticasone [facility]) 50 MCG/ACT nasal spray Place 2 sprays into both nostrils daily. 10/30/20   [name], MD  ibuprofen (ADVIL) 200 MG tablet Take 200 mg by mouth every 6 (six) hours as needed for moderate pain.    [provider]  lisinopril (ZESTRIL) 2.5 MG tablet Take 1 tablet (2.5 mg total) by mouth daily. 10/30/20   [name], MD  diphenhydramine-acetaminophen (TYLENOL PM) 25-500 MG TABS tablet Take 1 tablet by mouth at bedtime as needed (sleep).  08/23/19  [provider]    Allergies Doxycycline, Other, and Sulfonamide derivatives  Family History  Problem Relation Age of Onset  . CAD Mother        MI 2016  . Uterine cancer Mother   . Cervical cancer Mother   . Colon cancer Mother   .  Lung cancer Maternal Grandmother   . Cancer Maternal Grandfather        larynx  . Prostate cancer Maternal Grandfather   . Prostate cancer Father   . Breast cancer Neg Hx   . Liver disease Neg Hx   . Esophageal cancer Neg Hx   . Stomach cancer Neg Hx   . Colon polyps Neg Hx     Social History Social History   Tobacco Use  . Smoking status: Current Every Day Smoker    Packs/day: 1.00    Years: 27.00    Pack years: 27.00    Types: Cigarettes  . Smokeless tobacco: Never Used  . Tobacco comment: patient not ready to quit; considering patches  Vaping Use  . Vaping Use: Never used  Substance Use Topics  . Alcohol use:  Yes    Alcohol/week: 0.0 standard drinks    Comment: "a lot every day"   . Drug use: Yes    Frequency: 7.0 times per week    Types: Marijuana, Cocaine    Comment: not currently doing cocaine= Marijuana    Review of Systems  Constitutional: No fever/chills Eyes: No visual changes. ENT: No sore throat. Cardiovascular: Denies chest pain. Respiratory: Denies shortness of breath. Gastrointestinal: No abdominal pain.  No nausea, no vomiting.  No diarrhea.  No constipation. Genitourinary: Negative for dysuria. Musculoskeletal: Negative for back pain. Skin: Negative for rash. Neurological: Negative for headaches, focal weakness or numbness.  Positive for suicidal ideation and depression. ____________________________________________   PHYSICAL EXAM:  VITAL SIGNS: ED Triage Vitals [11/14/20 1206]  Enc Vitals Group     BP (!) 164/104     Pulse Rate 100     Resp 20     Temp 98.5 F (36.9 C)     Temp Source Oral     SpO2 96 %     Weight      Height      Head Circumference      Peak Flow      Pain Score 0     Pain Loc      Pain Edu?      Excl. in GC?     Constitutional: Alert and oriented. Eyes: Conjunctivae are normal. Head: Atraumatic. Nose: No congestion/rhinnorhea. Mouth/Throat: Mucous membranes are moist. Neck: Normal ROM Cardiovascular: Normal rate, regular rhythm. Grossly normal heart sounds. Respiratory: Normal respiratory effort.  No retractions. Lungs CTAB. Gastrointestinal: Soft and nontender. No distention. Genitourinary: deferred Musculoskeletal: No lower extremity tenderness nor edema. Neurologic:  Normal speech and language. No gross focal neurologic deficits are appreciated. Skin:  Skin is warm, dry and intact. No rash noted. Psychiatric: Labile affect. Speech and behavior are normal.  ____________________________________________   LABS (all labs ordered are listed, but only abnormal results are displayed)  Labs Reviewed  CBC WITH  DIFFERENTIAL/PLATELET - Abnormal; Notable for the following components:      Result Value   MCH 35.8 (*)    MCHC 36.1 (*)    All other components within normal limits  BASIC METABOLIC PANEL - Abnormal; Notable for the following components:   Creatinine, Ser 1.01 (*)    All other components within normal limits  SALICYLATE LEVEL - Abnormal; Notable for the following components:   Salicylate Lvl <7.0 (*)    All other components within normal limits  URINE DRUG SCREEN, QUALITATIVE ([facility] ONLY) - Abnormal; Notable for the following components:   Tricyclic, Ur Screen POSITIVE (*)    Cocaine Metabolite,Ur [place] POSITIVE (*)  Cannabinoid 50 Ng, Ur [place] POSITIVE (*)    All other components within normal limits  ETHANOL - Abnormal; Notable for the following components:   Alcohol, Ethyl (B) 133 (*)    All other components within normal limits  RESP PANEL BY RT-PCR (FLU A&B, COVID) ARPGX2  ACETAMINOPHEN LEVEL  POC URINE PREG, ED     PROCEDURES  Procedure(s) performed (including Critical Care):  Procedures   ____________________________________________   INITIAL IMPRESSION / ASSESSMENT AND PLAN / ED COURSE       50 year old female with past medical history of HIV, anxiety, and anemia who presents to the ED for suicidal ideation, reports plan to overdose on pain medication.  She denies any medical complaints and screening labs are unremarkable, patient may be medically cleared for psychiatric evaluation.  She was restarted on her HIV medication.  She was placed under IVC due to her acute suicidal ideation.  The patient has been placed in psychiatric observation due to the need to provide a safe environment for the patient while obtaining psychiatric consultation and evaluation, as well as ongoing medical and medication management to treat the patient's condition.  The patient has been placed under full IVC at this time.       ____________________________________________   FINAL  CLINICAL IMPRESSION(S) / ED DIAGNOSES  Final diagnoses:  Suicidal ideation     ED Discharge Orders    None       Note:  This document was prepared using Dragon voice recognition software and may include unintentional dictation errors.   Chesley Noon, MD 11/14/20 [id number]

## 2020-11-14 NOTE — ED Notes (Signed)
Meal tray given 

## 2020-11-14 NOTE — Consult Note (Addendum)
[facility] Face-to-Face Psychiatry Consult   Reason for Consult:  Anxiety, substance abuse Referring Physician:  EDP Patient Identification: Lori [name] MRN:  [id number] Principal Diagnosis: Alcohol abuse with alcohol-induced mood disorder (HCC) Diagnosis:  Principal Problem:   Alcohol abuse with alcohol-induced mood disorder (HCC) Active Problems:   ANXIETY DISORDER, GENERALIZED   Total Time spent with patient: 45 minutes  Subjective:   Lori [name] is a 50 y.o. female patient stated, "I drank a bunch of vodka, did some crack, and took some Tylenol PM (for pain purposes)."  HPI:  50 yo female presented to the ED under the influence of alcohol and cocaine complaining of anxiety and depression. Irritable and restless on assessment with comments that "Life sucks, life in general is not good."  Difficult to differentiate the substance intoxication versus the true underlying symptoms.  She currently endorses depression with passive suicidal ideations and high anxiety.  Her appetite is usually "banging until the past 2-3 days because I was using crack".  Sleep was also good until the past 2-3 days.  She does complain on more vivid and frequent dreams in the past couple of weeks.  No hallucinations, withdrawal symptoms, homicidal ideations.  She reports being on gabapentin in the past but did not like how it made her feel.  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  Drinks and uses crack on "occasion", did not push for more information based on her level of tolerating questions.  History of HIV and treated by ID in [facility] with Dr [name].  She lives in a trailer with her brother on her parents [address] acres and denies any issues.  Discussed medications and agreeable to start some to assist her mood.  She is currently interested in rehab, has tried multiple ones with little success.  Past Psychiatric History: depression, anxiety, alcohol and  cocaine abuse  Risk to Self:  yes Risk to Others:  none Prior Inpatient Therapy:  multiple rehabs Prior Outpatient Therapy:  none  Past Medical History:  Past Medical History:  Diagnosis Date  . Abnormal Pap smear    Had Cryo to treat  . Allergy   . Anemia   . Anxiety   . Cluster headaches   . HIV infection (HCC)   . Lung nodule   . Vaginal venereal warts     Past Surgical History:  Procedure Laterality Date  . ABDOMINAL HYSTERECTOMY    . KNEE ARTHROSCOPY Left    bakers cyst removal  . TUBAL LIGATION     Family History:  Family History  Problem Relation Age of Onset  . CAD Mother        MI 2016  . Uterine cancer Mother   . Cervical cancer Mother   . Colon cancer Mother   . Lung cancer Maternal Grandmother   . Cancer Maternal Grandfather        larynx  . Prostate cancer Maternal Grandfather   . Prostate cancer Father   . Breast cancer Neg Hx   . Liver disease Neg Hx   . Esophageal cancer Neg Hx   . Stomach cancer Neg Hx   . Colon polyps Neg Hx    Family Psychiatric  History: see above Social History:  Social History   Substance and Sexual Activity  Alcohol Use Yes  . Alcohol/week: 0.0 standard drinks   Comment: "a lot every day"      Social History   Substance and  Sexual Activity  Drug Use Yes  . Frequency: 7.0 times per week  . Types: Marijuana, Cocaine   Comment: not currently doing cocaine= Marijuana    Social History   Socioeconomic History  . Marital status: Single    Spouse name: Not on file  . Number of children: Not on file  . Years of education: Not on file  . Highest education level: Not on file  Occupational History  . Not on file  Tobacco Use  . Smoking status: Current Every Day Smoker    Packs/day: 1.00    Years: 27.00    Pack years: 27.00    Types: Cigarettes  . Smokeless tobacco: Never Used  . Tobacco comment: patient not ready to quit; considering patches  Vaping Use  . Vaping Use: Never used  Substance and Sexual  Activity  . Alcohol use: Yes    Alcohol/week: 0.0 standard drinks    Comment: "a lot every day"   . Drug use: Yes    Frequency: 7.0 times per week    Types: Marijuana, Cocaine    Comment: not currently doing cocaine= Marijuana  . Sexual activity: Yes    Birth control/protection: Surgical, Condom    Comment: pt. declined condoms  Other Topics Concern  . Not on file  Social History Narrative  . Not on file   Social Determinants of Health   Financial Resource Strain: Low Risk   . Difficulty of Paying Living Expenses: Not very hard  Food Insecurity: No Food Insecurity  . Worried About Programme researcher, broadcasting/film/video in the Last Year: Never true  . Ran Out of Food in the Last Year: Never true  Transportation Needs: Unmet Transportation Needs  . Lack of Transportation (Medical): Yes  . Lack of Transportation (Non-Medical): Yes  Physical Activity: Inactive  . Days of Exercise per Week: 0 days  . Minutes of Exercise per Session: 0 min  Stress: Stress Concern Present  . Feeling of Stress : Very much  Social Connections: Moderately Isolated  . Frequency of Communication with Friends and Family: More than three times a week  . Frequency of Social Gatherings with Friends and Family: Never  . Attends Religious Services: 1 to 4 times per year  . Active Member of Clubs or Organizations: No  . Attends Banker Meetings: Never  . Marital Status: Never married   Additional Social History:    Allergies:   Allergies  Allergen Reactions  . Doxycycline Swelling  . Other Swelling    Mulberry fruit   . Sulfonamide Derivatives Other (See Comments)    Childhood allergy.    Labs:  Results for orders placed or performed during the hospital encounter of 11/14/20 (from the past 48 hour(s))  CBC with Differential     Status: Abnormal   Collection Time: 11/14/20 12:40 PM  Result Value Ref Range   WBC 8.0 4.0 - 10.5 K/uL   RBC 3.94 3.87 - 5.11 MIL/uL   Hemoglobin 14.1 12.0 - 15.0 g/dL    HCT 16.1 09.6 - 04.5 %   MCV 99.2 80.0 - 100.0 fL   MCH 35.8 (H) 26.0 - 34.0 pg   MCHC 36.1 (H) 30.0 - 36.0 g/dL   RDW 40.9 81.1 - 91.4 %   Platelets 286 150 - 400 K/uL   nRBC 0.0 0.0 - 0.2 %   Neutrophils Relative % 57 %   Neutro Abs 4.6 1.7 - 7.7 K/uL   Lymphocytes Relative 34 %   Lymphs Abs  2.7 0.7 - 4.0 K/uL   Monocytes Relative 7 %   Monocytes Absolute 0.5 0.1 - 1.0 K/uL   Eosinophils Relative 1 %   Eosinophils Absolute 0.0 0.0 - 0.5 K/uL   Basophils Relative 1 %   Basophils Absolute 0.1 0.0 - 0.1 K/uL   Immature Granulocytes 0 %   Abs Immature Granulocytes 0.02 0.00 - 0.07 K/uL    Comment: Performed at [facility], [address] [place], [place] [address]  Basic metabolic panel     Status: Abnormal   Collection Time: 11/14/20 12:40 PM  Result Value Ref Range   Sodium 143 135 - 145 mmol/L   Potassium 3.6 3.5 - 5.1 mmol/L   Chloride 107 98 - 111 mmol/L   CO2 22 22 - 32 mmol/L   Glucose, Bld 90 70 - 99 mg/dL    Comment: Glucose reference range applies only to samples taken after fasting for at least 8 hours.   BUN 20 6 - 20 mg/dL   Creatinine, Ser 9.79 (H) 0.44 - 1.00 mg/dL   Calcium 9.4 8.9 - 89.2 mg/dL   GFR, Estimated >11 >94 mL/min    Comment: (NOTE) Calculated using the CKD-EPI Creatinine Equation (2021)    Anion gap 14 5 - 15    Comment: Performed at [facility], [address] [place], [place] [address]  Salicylate level     Status: Abnormal   Collection Time: 11/14/20 12:40 PM  Result Value Ref Range   Salicylate Lvl <7.0 (L) 7.0 - 30.0 mg/dL    Comment: Performed at [facility], [address] [place], [place] [address]  Acetaminophen level     Status: None   Collection Time: 11/14/20 12:40 PM  Result Value Ref Range   Acetaminophen (Tylenol), Serum 13 10 - 30 ug/mL    Comment: (NOTE) Therapeutic concentrations vary significantly. A range of 10-30 ug/mL  may be an effective concentration for many patients. However, some   are best treated at concentrations outside of this range. Acetaminophen concentrations >150 ug/mL at 4 hours after ingestion  and >50 ug/mL at 12 hours after ingestion are often associated with  toxic reactions.  Performed at [facility], [address] [place], [place] [address]   Ethanol     Status: Abnormal   Collection Time: 11/14/20 12:40 PM  Result Value Ref Range   Alcohol, Ethyl (B) 133 (H) <10 mg/dL    Comment: (NOTE) Lowest detectable limit for serum alcohol is 10 mg/dL.  For medical purposes only. Performed at [facility], [address] [place], [place] [address]   Urine Drug Screen, Qualitative     Status: Abnormal   Collection Time: 11/14/20 12:41 PM  Result Value Ref Range   Tricyclic, Ur Screen POSITIVE (A) NONE DETECTED   Amphetamines, Ur Screen NONE DETECTED NONE DETECTED   MDMA (Ecstasy)Ur Screen NONE DETECTED NONE DETECTED   Cocaine Metabolite,Ur [place] POSITIVE (A) NONE DETECTED   Opiate, Ur Screen NONE DETECTED NONE DETECTED   Phencyclidine (PCP) Ur S NONE DETECTED NONE DETECTED   Cannabinoid 50 Ng, Ur [place] POSITIVE (A) NONE DETECTED   Barbiturates, Ur Screen NONE DETECTED NONE DETECTED   Benzodiazepine, Ur Scrn NONE DETECTED NONE DETECTED   Methadone Scn, Ur NONE DETECTED NONE DETECTED    Comment: (NOTE) Tricyclics + metabolites, urine    Cutoff 1000 ng/mL Amphetamines + metabolites, urine  Cutoff 1000 ng/mL MDMA (Ecstasy), urine  Cutoff 500 ng/mL Cocaine Metabolite, urine          Cutoff 300 ng/mL Opiate + metabolites, urine        Cutoff 300 ng/mL Phencyclidine (PCP), urine         Cutoff 25 ng/mL Cannabinoid, urine                 Cutoff 50 ng/mL Barbiturates + metabolites, urine  Cutoff 200 ng/mL Benzodiazepine, urine              Cutoff 200 ng/mL Methadone, urine                   Cutoff 300 ng/mL  The urine drug screen provides only a preliminary, unconfirmed analytical test result and should not be used for  non-medical purposes. Clinical consideration and professional judgment should be applied to any positive drug screen result due to possible interfering substances. A more specific alternate chemical method must be used in order to obtain a confirmed analytical result. Gas chromatography / mass spectrometry (GC/MS) is the preferred confirm atory method. Performed at [facility], [address] [place], [place] [address]     No current facility-administered medications for this encounter.   Current Outpatient Medications  Medication Sig Dispense Refill  . acetaminophen (TYLENOL) 325 MG tablet Take 650 mg by mouth every 6 (six) hours as needed.    . bictegravir-emtricitabine-tenofovir AF (BIKTARVY) 50-200-25 MG TABS tablet TAKE 1 TABLET BY MOUTH DAILY. 30 tablet 5  . cyclobenzaprine (FLEXERIL) 10 MG tablet Take 1 tablet (10 mg total) by mouth at bedtime. (Patient not taking: Reported on 11/14/2020) 15 tablet 0  . fluticasone (FLONASE) 50 MCG/ACT nasal spray Place 2 sprays into both nostrils daily. 15 g 2  . ibuprofen (ADVIL) 200 MG tablet Take 200 mg by mouth every 6 (six) hours as needed for moderate pain.    Marland Kitchen lisinopril (ZESTRIL) 2.5 MG tablet Take 1 tablet (2.5 mg total) by mouth daily. 30 tablet 3    Musculoskeletal: Strength & Muscle Tone: within normal limits Gait & Station: normal Patient leans: N/A  Psychiatric Specialty Exam: Physical Exam Vitals and nursing note reviewed.  Constitutional:      Appearance: Normal appearance.  HENT:     Head: Normocephalic.     Nose: Nose normal.  Pulmonary:     Effort: Pulmonary effort is normal.  Musculoskeletal:        General: Normal range of motion.     Cervical back: Normal range of motion.  Neurological:     General: No focal deficit present.     Mental Status: She is alert and oriented to person, place, and time.  Psychiatric:        Attention and Perception: Attention and perception normal.        Mood and  Affect: Mood is anxious and depressed.        Speech: Speech is slurred.        Behavior: Behavior is agitated.        Thought Content: Thought content normal.        Cognition and Memory: Cognition is impaired.        Judgment: Judgment is impulsive.     Review of Systems  Psychiatric/Behavioral: Positive for depression and substance abuse. The patient is nervous/anxious and has insomnia.   All other systems reviewed and are negative.   Blood pressure (!) 164/104, pulse 100, temperature 98.5 F (36.9 C), temperature source Oral, resp. rate 20, height  5\' 4"  (1.626 m), weight 102.5 kg, SpO2 96 %.Body mass index is 38.79 kg/m.  General Appearance: Disheveled  Eye Contact:  Fair  Speech:  Normal Rate  Volume:  Normal  Mood:  Anxious, Depressed and Irritable  Affect:  Congruent  Thought Process:  Coherent and Descriptions of Associations: Intact  Orientation:  Full (Time, Place, and Person)  Thought Content:  Rumination  Suicidal Thoughts:  Yes.  without intent/plan  Homicidal Thoughts:  No  Memory:  Immediate;   Fair Recent;   Fair Remote;   Fair  Judgement:  Impaired  Insight:  Fair  Psychomotor Activity:  Restless  Concentration:  Concentration: Fair and Attention Span: Fair  Recall:  Fis[employer]Fair  Fund of Knowledge:  Fair  Language:  Fair  Akathisia:  No  Handed:  Right  AIMS (if indicated):     Assets:  Housing Leisure Time Resilience Social Support  ADL's:  Intact  Cognition:  Impaired,  Mild  Sleep:      Physical Exam: Physical Exam Vitals and nursing note reviewed.  Constitutional:      Appearance: Normal appearance.  HENT:     Head: Normocephalic.     Nose: Nose normal.  Pulmonary:     Effort: Pulmonary effort is normal.  Musculoskeletal:        General: Normal range of motion.     Cervical back: Normal range of motion.  Neurological:     General: No focal deficit present.     Mental Status: She is alert and oriented to person, place, and time.   Psychiatric:        Attention and Perception: Attention and perception normal.        Mood and Affect: Mood is anxious and depressed.        Speech: Speech is slurred.        Behavior: Behavior is agitated.        Thought Content: Thought content normal.        Cognition and Memory: Cognition is impaired.        Judgment: Judgment is impulsive.    Review of Systems  Psychiatric/Behavioral: Positive for depression and substance abuse. The patient is nervous/anxious and has insomnia.   All other systems reviewed and are negative.  Blood pressure (!) 164/104, pulse 100, temperature 98.5 F (36.9 C), temperature source Oral, resp. rate 20, height 5\' 4"  (1.626 m), weight 102.5 kg, SpO2 96 %. Body mass index is 38.79 kg/m.  Treatment Plan Summary: Daily contact with patient to assess and evaluate symptoms and progress in treatment, Medication management and Plan :  Cocaine induced mood disorder: -Started Celexa 10 mg daily -Started Trileptal 150 mg BID   Anxiety: -Started hydroxyzine 25 mg TID PRN  Insomnia: -Started Trazodone 50 mg daily at bedtime PRN  Alcohol use disorder: -Ativan 1 mg every 6 hours PRN withdrawal symptoms  Disposition: Supportive therapy provided about ongoing stressors.  Reassess tomorrow when not intoxicated prior to making a decision.    Nanine MeansJamison Cletis Muma, NP 11/14/2020 2:36 PM

## 2020-11-14 NOTE — ED Triage Notes (Signed)
Pt brought in by EMS after she was found on the side of the road.  Pt states she jumped out of her mother's truck.  Pt denies HI, but admits she is a danger to herself.

## 2020-11-14 NOTE — ED Notes (Signed)
Pt knows she cannot leave because she is under IVC.  Pt expresses understanding.

## 2020-11-14 NOTE — ED Notes (Signed)
Pt requested medication for anxiety and a headache.  EDP made aware. Medication administered as ordered.

## 2020-11-14 NOTE — ED Notes (Signed)
IVC pending reassessment tomorrow

## 2020-11-14 NOTE — ED Notes (Signed)
Pt allowed to get phone numbers out of her cell phone. Officer in room while this was done. Cell phone back in patient's purse.

## 2020-11-14 NOTE — ED Notes (Signed)
Pt stated she would dress out after seeing a doctor.  EDP at bedside now.     Officer and security searched  pt's purse (small silver knife, $80 and [place] ID).   All items left in purse and placed in belongings bag - NOT  in pt's position.

## 2020-11-14 NOTE — ED Notes (Signed)
Pt changed into scrubs with this Furniture conservator/restorer (2 belongings bags).

## 2020-11-15 DIAGNOSIS — F1014 Alcohol abuse with alcohol-induced mood disorder: Secondary | ICD-10-CM | POA: Diagnosis not present

## 2020-11-15 LAB — RESP PANEL BY RT-PCR (FLU A&B, COVID) ARPGX2
Influenza A by PCR: NEGATIVE
Influenza B by PCR: NEGATIVE
SARS Coronavirus 2 by RT PCR: NEGATIVE

## 2020-11-15 MED ORDER — OXCARBAZEPINE 150 MG PO TABS: 150.0000 mg | ORAL_TABLET | Freq: Two times a day (BID) | ORAL | 0 refills | Status: DC

## 2020-11-15 MED ORDER — TRAZODONE HCL 50 MG PO TABS: 50.0000 mg | ORAL_TABLET | Freq: Every evening | ORAL | 0 refills | Status: DC | PRN

## 2020-11-15 MED ORDER — CITALOPRAM HYDROBROMIDE 10 MG PO TABS: 10.0000 mg | ORAL_TABLET | Freq: Every day | ORAL | 1 refills | Status: DC

## 2020-11-15 NOTE — Discharge Instructions (Signed)
Alcohol Abuse and Dependence Information, Adult Alcohol is a widely available drug. People drink alcohol in different amounts. People who drink alcohol very often and in large amounts often have problems during and after drinking. They may develop what is called an alcohol use disorder. There are two main types of alcohol use disorders:  Alcohol abuse. This is when you use alcohol too much or too often. You may use alcohol to make yourself feel happy or to reduce stress. You may have a hard time setting a limit on the amount you drink.  Alcohol dependence. This is when you use alcohol consistently for a period of time, and your body changes as a result. This can make it hard to stop drinking because you may start to feel sick or feel different when you do not use alcohol. These symptoms are known as withdrawal. How can alcohol abuse and dependence affect me? Alcohol abuse and dependence can have a negative effect on your life. Drinking too much can lead to addiction. You may feel like you need alcohol to function normally. You may drink alcohol before work in the morning, during the day, or as soon as you get home from work in the evening. These actions can result in:  Poor work performance.  Job loss.  Financial problems.  Car crashes or criminal charges from driving after drinking alcohol.  Problems in your relationships with friends and family.  Losing the trust and respect of coworkers, friends, and family. Drinking heavily over a long period of time can permanently damage your body and brain, and can cause lifelong health issues, such as:  Damage to your liver or pancreas.  Heart problems, high blood pressure, or stroke.  Certain cancers.  Decreased ability to fight infections.  Brain or nerve damage.  Depression.  Early (premature) death. If you are careless or you crave alcohol, it is easy to drink more than your body can handle (overdose). Alcohol overdose is a serious  situation that requires hospitalization. It may lead to permanent injuries or death. What can increase my risk?  Having a family history of alcohol abuse.  Having depression or other mental health conditions.  Beginning to drink at an early age.  Binge drinking often.  Experiencing trauma, stress, and an unstable home life during childhood.  Spending time with people who drink often. What actions can I take to prevent or manage alcohol abuse and dependence?  Do not drink alcohol if: ? Your health care provider tells you not to drink. ? You are pregnant, may be pregnant, or are planning to become pregnant.  If you drink alcohol: ? Limit how much you use to:  0-1 drink a day for women.  0-2 drinks a day for men. ? Be aware of how much alcohol is in your drink. In the [place] one drink equals one 12 oz bottle of beer (355 mL), one 5 oz glass of wine (148 mL), or one 1 oz glass of hard liquor (44 mL).  Stop drinking if you have been drinking too much. This can be very hard to do if you are used to abusing alcohol. If you begin to have withdrawal symptoms, talk with your health care provider or a person that you trust. These symptoms may include anxiety, shaky hands, headache, nausea, sweating, or not being able to sleep.  Choose to drink nonalcoholic beverages in social gatherings and places where there may be alcohol. Activity  Spend more time on activities that you enjoy that do   not involve alcohol, like hobbies or exercise.  Find healthy ways to cope with stress, such as exercise, meditation, or spending time with people you care about. General information  Talk to your family, coworkers, and friends about supporting you in your efforts to stop drinking. If they drink, ask them not to drink around you. Spend more time with people who do not drink alcohol.  If you think that you have an alcohol dependency problem: ? Tell friends or family about your concerns. ? Talk with your  health care provider or another health professional about where to get help. ? Work with a therapist and a chemical dependency counselor. ? Consider joining a support group for people who struggle with alcohol abuse and dependence. Where to find support  Your health care provider.  SMART Recovery: www.smartrecovery.org Therapy and support groups  Local treatment centers or chemical dependency counselors.  Local AA groups in your community: www.aa.org   Where to find more information  Centers for Disease Control and Prevention: www.cdc.gov  National Institute on Alcohol Abuse and Alcoholism: www.niaaa.nih.gov  Alcoholics Anonymous (AA): www.aa.org Contact a health care provider if:  You drank more or for longer than you intended on more than one occasion.  You tried to stop drinking or to cut back on how much you drink, but you were not able to.  You often drink to the point of vomiting or passing out.  You want to drink so badly that you cannot think about anything else.  You have problems in your life due to drinking, but you continue to drink.  You keep drinking even though you feel anxious, depressed, or have experienced memory loss.  You have stopped doing the things you used to enjoy in order to drink.  You have to drink more than you used to in order to get the effect you want.  You experience anxiety, sweating, nausea, shakiness, and trouble sleeping when you try to stop drinking. Get help right away if:  You have thoughts about hurting yourself or others.  You have serious withdrawal symptoms, including: ? Confusion. ? Racing heart. ? High blood pressure. ? Fever. If you ever feel like you may hurt yourself or others, or have thoughts about taking your own life, get help right away. You can go to your nearest emergency department or call:  Your local emergency services (911 in the [place]  A suicide crisis helpline, such as the National Suicide Prevention  Lifeline at 1-800-273-8255. This is open 24 hours a day. Summary  Alcohol abuse and dependence can have a negative effect on your life. Drinking too much or too often can lead to addiction.  If you drink alcohol, limit how much you use.  If you are having trouble keeping your drinking under control, find ways to change your behavior. Hobbies, calming activities, exercise, or support groups can help.  If you feel you need help with changing your drinking habits, talk with your health care provider, a good friend, or a therapist, or go to an AA group. This information is not intended to replace advice given to you by your health care provider. Make sure you discuss any questions you have with your health care provider. Document Revised: 09/19/2018 Document Reviewed: 08/08/2018 Elsevier Patient Education  2021 Elsevier Inc.  

## 2020-11-15 NOTE — ED Notes (Signed)
Pt eating food; report 2/10 pain is from the bruises all over her arms.

## 2020-11-15 NOTE — ED Notes (Signed)
IVC / pending reassessment in the AM. 

## 2020-11-15 NOTE — ED Notes (Signed)
Patient ambulatory with steady gait to restroom.  No acute distress noted.

## 2020-11-15 NOTE — ED Notes (Signed)
Pt given phone as requested; pt wanted to check in with her ride (brother). Pt calm and cooperative currently; steady on feet.

## 2020-11-15 NOTE — ED Notes (Signed)
Pt given meal tray at this time 

## 2020-11-15 NOTE — ED Provider Notes (Signed)
Emergency Medicine Observation Re-evaluation Note  Lori [name] is a 50 y.o. female, seen on rounds today.  Pt initially presented to the ED for complaints of Psychiatric Evaluation Currently, the patient is resting well without complaints other than wanting to go home.  Physical Exam  BP (!) 142/96   Pulse 82   Temp 98.9 F (37.2 C) (Oral)   Resp 16   Ht 5\' 4"  (1.626 m)   Wt 102.5 kg   SpO2 94%   BMI 38.79 kg/m  Physical Exam Gen: No acute distress  Resp: Normal rise and fall of chest Neuro: Moving all four extremities Psych: Resting currently, calm and cooperative when awake    ED Course / MDM  EKG:   I have reviewed the labs performed to date as well as medications administered while in observation.  Recent changes in the last 24 hours include no acute events overnight.  Plan  Current plan is for psychiatric reassessment in the morning for disposition. Patient is under full IVC at this time.   Amour Cutrone, , DO 11/15/20 [phone]

## 2020-11-15 NOTE — ED Notes (Addendum)
[employer] states cab will be here in about . Pt notified. Pt given belongings bag.

## 2020-11-15 NOTE — ED Notes (Signed)
Report to megan, rn.  

## 2020-11-15 NOTE — ED Notes (Signed)
Pt adamant she wants to wait out in lobby for the cab. Voucher given to first nurse.

## 2020-11-15 NOTE — Consult Note (Signed)
[facility] Psych ED Discharge  11/15/2020 11:21 AM Lori [name]  MRN:  [id number] Principal Problem: Alcohol abuse with alcohol-induced mood disorder [facility]) Discharge Diagnoses: Principal Problem:   Alcohol abuse with alcohol-induced mood disorder (HCC) Active Problems:   ANXIETY DISORDER, GENERALIZED   Cocaine abuse (HCC)   Subjective: "I just want to go home and sleep with my cat. I'm doing a lot better than I was yesterday.  I don't know if it was the medications or the drugs are out of my system."  50 yo female presented with alcohol and cocaine intoxication with some self-harm comments.  Today, she is clear and coherent.  Denies suicidal ideations along with past attempts.  When asked about making threats to the EDP, she stated, "I was joking.  He asked me if I had a gun and I said 'No, but I have teeth, if I want to hurt myself."  She has showered and presented well today.  Calm and cooperative.  She states she asked her mother to pull the car over and she unlocked the door and exited as her mother was "shrieking at me.  She's tired of my crap."  Denies homicidal ideations, withdrawal symptoms, mania, or other concerning issues except low level of anxiety related to wanting to leave.  No guns in the home or access.  Her brother lives with her and he is not allowed to have guns based on his felony status.  She gave permission to talk to her mother.  The psych team contacted her mother, Lori [name], who stated her concern "is her drug use.  She will abuse whatever she gets.  Her problem is drug use, not psychological.  She likes to get high.  We (parents) have been dealing with this for 40 years.  She's not a danger to herself or other people.  I'm not giving up on her and can get her to any appointments or rehabs."  She and the client would like to continue her recovery outpatient as she has been in several rehabs already.  Dr. Toni Amend reviewed and concurs with the plan to discharge.    Total  Time spent with patient: 45 minutes  Past Psychiatric History: anxiety, polysubstance use d/o  Past Medical History:  Past Medical History:  Diagnosis Date  . Abnormal Pap smear    Had Cryo to treat  . Allergy   . Anemia   . Anxiety   . Cluster headaches   . HIV infection (HCC)   . Lung nodule   . Vaginal venereal warts     Past Surgical History:  Procedure Laterality Date  . ABDOMINAL HYSTERECTOMY    . KNEE ARTHROSCOPY Left    bakers cyst removal  . TUBAL LIGATION     Family History:  Family History  Problem Relation Age of Onset  . CAD Mother        MI 2016  . Uterine cancer Mother   . Cervical cancer Mother   . Colon cancer Mother   . Lung cancer Maternal Grandmother   . Cancer Maternal Grandfather        larynx  . Prostate cancer Maternal Grandfather   . Prostate cancer Father   . Breast cancer Neg Hx   . Liver disease Neg Hx   . Esophageal cancer Neg Hx   . Stomach cancer Neg Hx   . Colon polyps Neg Hx    Family Psychiatric  History: see above Social History:  Social History   Substance and  Sexual Activity  Alcohol Use Yes  . Alcohol/week: 0.0 standard drinks   Comment: "a lot every day"      Social History   Substance and Sexual Activity  Drug Use Yes  . Frequency: 7.0 times per week  . Types: Marijuana, Cocaine   Comment: not currently doing cocaine= Marijuana    Social History   Socioeconomic History  . Marital status: Single    Spouse name: Not on file  . Number of children: Not on file  . Years of education: Not on file  . Highest education level: Not on file  Occupational History  . Not on file  Tobacco Use  . Smoking status: Current Every Day Smoker    Packs/day: 1.00    Years: 27.00    Pack years: 27.00    Types: Cigarettes  . Smokeless tobacco: Never Used  . Tobacco comment: patient not ready to quit; considering patches  Vaping Use  . Vaping Use: Never used  Substance and Sexual Activity  . Alcohol use: Yes     Alcohol/week: 0.0 standard drinks    Comment: "a lot every day"   . Drug use: Yes    Frequency: 7.0 times per week    Types: Marijuana, Cocaine    Comment: not currently doing cocaine= Marijuana  . Sexual activity: Yes    Birth control/protection: Surgical, Condom    Comment: pt. declined condoms  Other Topics Concern  . Not on file  Social History Narrative  . Not on file   Social Determinants of Health   Financial Resource Strain: Low Risk   . Difficulty of Paying Living Expenses: Not very hard  Food Insecurity: No Food Insecurity  . Worried About Programme researcher, broadcasting/film/videounning Out of Food in the Last Year: Never true  . Ran Out of Food in the Last Year: Never true  Transportation Needs: Unmet Transportation Needs  . Lack of Transportation (Medical): Yes  . Lack of Transportation (Non-Medical): Yes  Physical Activity: Inactive  . Days of Exercise per Week: 0 days  . Minutes of Exercise per Session: 0 min  Stress: Stress Concern Present  . Feeling of Stress : Very much  Social Connections: Moderately Isolated  . Frequency of Communication with Friends and Family: More than three times a week  . Frequency of Social Gatherings with Friends and Family: Never  . Attends Religious Services: 1 to 4 times per year  . Active Member of Clubs or Organizations: No  . Attends BankerClub or Organization Meetings: Never  . Marital Status: Never married    Has this patient used any form of tobacco in the last 30 days? (Cigarettes, Smokeless Tobacco, Cigars, and/or Pipes) A prescription for an FDA-approved tobacco cessation medication was offered at discharge and the patient refused  Current Medications: Current Facility-Administered Medications  Medication Dose Route Frequency Provider Last Rate Last Admin  . acetaminophen (TYLENOL) tablet 1,000 mg  1,000 mg Oral Once Delton PrairieSmith, Dylan, MD      . bictegravir-emtricitabine-tenofovir AF (BIKTARVY) 50-200-25 MG per tablet 1 tablet  1 tablet Oral Daily Charm RingsLord, Shaye Elling Y, NP    1 tablet at 11/15/20 [phone]  . citalopram (CELEXA) tablet 10 mg  10 mg Oral Daily Charm RingsLord, Sudeep Scheibel Y, NP   10 mg at 11/15/20 0920  . lisinopril (ZESTRIL) tablet 2.5 mg  2.5 mg Oral Daily Chesley NoonJessup, Charles, MD   2.5 mg at 11/15/20 0919  . LORazepam (ATIVAN) tablet 1 mg  1 mg Oral Q6H PRN Charm RingsLord, Lauralynn Loeb Y, NP  1 mg at 11/15/20 0918  . nicotine (NICODERM CQ - dosed in mg/24 hours) patch 21 mg  21 mg Transdermal Once Chesley Noon, MD   21 mg at 11/14/20 1525  . OXcarbazepine (TRILEPTAL) tablet 150 mg  150 mg Oral BID Charm Rings, NP   150 mg at 11/15/20 5784  . traZODone (DESYREL) tablet 50 mg  50 mg Oral QHS PRN Charm Rings, NP       Current Outpatient Medications  Medication Sig Dispense Refill  . acetaminophen (TYLENOL) 325 MG tablet Take 650 mg by mouth every 6 (six) hours as needed.    . bictegravir-emtricitabine-tenofovir AF (BIKTARVY) 50-200-25 MG TABS tablet TAKE 1 TABLET BY MOUTH DAILY. 30 tablet 5  . cyclobenzaprine (FLEXERIL) 10 MG tablet Take 1 tablet (10 mg total) by mouth at bedtime. (Patient not taking: Reported on 11/14/2020) 15 tablet 0  . fluticasone (FLONASE) 50 MCG/ACT nasal spray Place 2 sprays into both nostrils daily. 15 g 2  . ibuprofen (ADVIL) 200 MG tablet Take 200 mg by mouth every 6 (six) hours as needed for moderate pain.    Marland Kitchen lisinopril (ZESTRIL) 2.5 MG tablet Take 1 tablet (2.5 mg total) by mouth daily. 30 tablet 3   PTA Medications: (Not in a hospital admission)   Musculoskeletal: Strength & Muscle Tone: within normal limits Gait & Station: normal Patient leans: N/A  Psychiatric Specialty Exam: Physical Exam Vitals and nursing note reviewed.  Constitutional:      Appearance: Normal appearance.  HENT:     Head: Normocephalic.     Nose: Nose normal.  Pulmonary:     Effort: Pulmonary effort is normal.  Musculoskeletal:        General: Normal range of motion.     Cervical back: Normal range of motion.  Neurological:     General: No focal deficit  present.     Mental Status: She is alert and oriented to person, place, and time.  Psychiatric:        Attention and Perception: Attention and perception normal.        Mood and Affect: Affect normal. Mood is anxious.        Speech: Speech normal.        Behavior: Behavior normal. Behavior is cooperative.        Thought Content: Thought content normal.        Cognition and Memory: Cognition and memory normal.        Judgment: Judgment normal.     Review of Systems  Psychiatric/Behavioral: Positive for substance abuse. The patient is nervous/anxious.   All other systems reviewed and are negative.   Blood pressure (!) 116/94, pulse 74, temperature 97.9 F (36.6 C), temperature source Oral, resp. rate 19, height 5\' 4"  (1.626 m), weight 102.5 kg, SpO2 94 %.Body mass index is 38.79 kg/m.  General Appearance: Casual  Eye Contact:  Good  Speech:  Normal Rate  Volume:  Normal  Mood:  Anxious  Affect:  Congruent  Thought Process:  Coherent and Descriptions of Associations: Intact  Orientation:  Full (Time, Place, and Person)  Thought Content:  WDL and Logical  Suicidal Thoughts:  No  Homicidal Thoughts:  No  Memory:  Immediate;   Good Recent;   Good Remote;   Good  Judgement:  Fair  Insight:  Fair  Psychomotor Activity:  Normal  Concentration:  Concentration: Good and Attention Span: Good  Recall:  Good  Fund of Knowledge:  Good  Language:  Good  Akathisia:  No  Handed:  Right  AIMS (if indicated):     Assets:  Housing Leisure Time Resilience Social Support  ADL's:  Intact  Cognition:  WNL  Sleep:       Physical Exam: Physical Exam Vitals and nursing note reviewed.  Constitutional:      Appearance: Normal appearance.  HENT:     Head: Normocephalic.     Nose: Nose normal.  Pulmonary:     Effort: Pulmonary effort is normal.  Musculoskeletal:        General: Normal range of motion.     Cervical back: Normal range of motion.  Neurological:     General: No focal  deficit present.     Mental Status: She is alert and oriented to person, place, and time.  Psychiatric:        Attention and Perception: Attention and perception normal.        Mood and Affect: Affect normal. Mood is anxious.        Speech: Speech normal.        Behavior: Behavior normal. Behavior is cooperative.        Thought Content: Thought content normal.        Cognition and Memory: Cognition and memory normal.        Judgment: Judgment normal.    Review of Systems  Psychiatric/Behavioral: Positive for substance abuse. The patient is nervous/anxious.   All other systems reviewed and are negative.  Blood pressure (!) 156/114, pulse 70, temperature 97.9 F (36.6 C), temperature source Oral, resp. rate 18, height 5\' 4"  (1.626 m), weight 102.5 kg, SpO2 95 %. Body mass index is 38.79 kg/m.   Demographic Factors:  Caucasian  Loss Factors: NA  Historical Factors: NA  Risk Reduction Factors:   Sense of responsibility to family, Living with another person, especially a relative and Positive social support  Continued Clinical Symptoms:  Anxiety, mild  Cognitive Features That Contribute To Risk:  None    Suicide Risk:  Minimal: No identifiable suicidal ideation.  Patients presenting with no risk factors but with morbid ruminations; may be classified as minimal risk based on the severity of the depressive symptoms   Plan Of Care/Follow-up recommendations:  Cocaine induced mood disorder: -Continued Celexa 10 mg daily -Continued Trileptal 150 mg BID   Anxiety: -Continued hydroxyzine 25 mg TID PRN  Insomnia: -Continued Trazodone 50 mg daily at bedtime PRN  Alcohol use disorder: -Encouraged 12-step program with a sponsor -Refrain from alcohol and drug use -Follow up with resources provided. Activity:  as tolerated Diet:  heart healthy diet  Disposition: discharge home , NP 11/15/2020, 11:21 AM

## 2020-11-15 NOTE — BH Assessment (Signed)
Late entry- TTS and Nanine Means, NP made contact with pt's mother Kesi Perrow [phone]) who expressed concerns with pt's continued SA and need for help. [name] denied any safety concerns stating, "she is not a danger to herself or other people". [name] also denied concerns with pt endorsing SI/HI/AH/VH and agreed to pt's current disposition and help pt follow up with the OPT resources provided.   TTS updated pt who also agreed with the support of her family, to follow up with resources provided.

## 2020-11-27 ENCOUNTER — Encounter: Payer: Self-pay | Admitting: Infectious Diseases

## 2020-11-27 ENCOUNTER — Ambulatory Visit (INDEPENDENT_AMBULATORY_CARE_PROVIDER_SITE_OTHER): Payer: Medicare Other | Admitting: Infectious Diseases

## 2020-11-27 ENCOUNTER — Other Ambulatory Visit: Payer: Self-pay

## 2020-11-27 ENCOUNTER — Other Ambulatory Visit (HOSPITAL_COMMUNITY): Payer: Self-pay

## 2020-11-27 DIAGNOSIS — F1014 Alcohol abuse with alcohol-induced mood disorder: Secondary | ICD-10-CM

## 2020-11-27 DIAGNOSIS — F1410 Cocaine abuse, uncomplicated: Secondary | ICD-10-CM | POA: Diagnosis not present

## 2020-11-27 DIAGNOSIS — I10 Essential (primary) hypertension: Secondary | ICD-10-CM

## 2020-11-27 DIAGNOSIS — K623 Rectal prolapse: Secondary | ICD-10-CM | POA: Insufficient documentation

## 2020-11-27 DIAGNOSIS — B20 Human immunodeficiency virus [HIV] disease: Secondary | ICD-10-CM | POA: Diagnosis not present

## 2020-11-27 MED ORDER — TRAZODONE HCL 50 MG PO TABS
50.0000 mg | ORAL_TABLET | Freq: Every evening | ORAL | 0 refills | Status: DC | PRN
  Filled 2020-11-27 – 2020-12-02 (×2): qty 30, 30d supply, fill #0

## 2020-11-27 MED ORDER — CITALOPRAM HYDROBROMIDE 10 MG PO TABS
10.0000 mg | ORAL_TABLET | Freq: Every day | ORAL | 1 refills | Status: DC
  Filled 2020-11-27 – 2020-12-02 (×2): qty 30, 30d supply, fill #0
  Filled 2021-01-12: qty 30, 30d supply, fill #1

## 2020-11-27 MED ORDER — LISINOPRIL 5 MG PO TABS
5.0000 mg | ORAL_TABLET | Freq: Every day | ORAL | 11 refills | Status: DC
  Filled 2020-11-27: qty 90, 90d supply, fill #0

## 2020-11-27 NOTE — Assessment & Plan Note (Signed)
Her lisinopril is refilled, increased.

## 2020-11-27 NOTE — Assessment & Plan Note (Signed)
She c/o rectal prolapse, stool incontinence.  Will have her seen by surgery.

## 2020-11-27 NOTE — Assessment & Plan Note (Signed)
States that she is clean currently.

## 2020-11-27 NOTE — Progress Notes (Signed)
Subjective:    Patient ID: Lori [name], female  DOB: [birth year], 50 y.o.        MRN: [id number]   HPI 50 yo F with HIV+ (dx 2004), anxiety d/o, anal/vaginal warts, AIN1 (HPV+), tobacco use.   Previously on atripla (made her depressed suicidal).  tried odefsy but couldn't tolerate. Then changed to genvoya which she went off of . States it makes her throw up. At her f/u 11-2017, she was changed to biktarvy.  denies missed doses.    Son [name], born 2004, HIV -. Oldest son in prison ("baned from all the [place] property... until he learns to keep his hands to himself"). Brother living with her now as well.   Has been drinking more (but same as 6 months ago). 1/5 a day. Taking care of brother- "we are basically room mates at this point". He is bipolar, hypertensive, had 2 strokes.  She was in [facility] psych ed 11-14-20 in ETOH/drug w/d (smoked a bunch of crack, drank a bunch of vodka).  Was given celexa 10mg , trazadone 50mg  (for sleep).  Would like lisinopril refill as well.   Has concerns today about rectal prolapse, incontenence of stool.  She also has urinary hesitancy. Has urinated 3 times today.  Has cocaine in her system toda (had to give dealer a ride).   HIV 1 RNA Quant  Date Value  10/30/2020 Not Detected Copies/mL  12/03/2019 30 copies/mL (H)  11/13/2018 <20 NOT DETECTED copies/mL   CD4 T Cell Abs (/uL)  Date Value  10/30/2020 570  12/03/2019 503  11/13/2018 664     Health Maintenance  Topic Date Due  . Pneumococcal Vaccine 69-51 Years old (1 - PCV) Never done  . COLONOSCOPY (Pts 45-69yrs Insurance coverage will need to be confirmed)  Never done  . COVID-19 Vaccine (3 - Janssen risk series) 06/10/2020  . PAP SMEAR-Modifier  12/17/2020  . INFLUENZA VACCINE  01/12/2021  . TETANUS/TDAP  11/17/2027  . Hepatitis C Screening  Completed  . HIV Screening  Completed  . HPV VACCINES  Aged Out      Review of Systems  Constitutional:  Negative for chills and fever.   HENT:  Positive for congestion and sinus pain.   Respiratory:  Positive for cough. Negative for shortness of breath.   Gastrointestinal:  Negative for constipation and diarrhea.  Genitourinary:  Negative for dysuria.   Please see HPI. All other systems reviewed and negative.     Objective:  Physical Exam Vitals reviewed.  Constitutional:      Appearance: She is obese.  HENT:     Mouth/Throat:     Mouth: Mucous membranes are moist.     Pharynx: No oropharyngeal exudate.  Eyes:     Extraocular Movements: Extraocular movements intact.     Pupils: Pupils are equal, round, and reactive to light.  Cardiovascular:     Rate and Rhythm: Normal rate and regular rhythm.  Pulmonary:     Effort: Pulmonary effort is normal.     Breath sounds: Normal breath sounds.  Abdominal:     General: Bowel sounds are normal. There is no distension.     Palpations: Abdomen is soft.     Tenderness: There is no abdominal tenderness.  Musculoskeletal:        General: Normal range of motion.     Cervical back: Normal range of motion and neck supple.     Right lower leg: No edema.     Left lower leg: No  edema.  Neurological:     Mental Status: She is alert.  Psychiatric:        Mood and Affect: Mood is anxious and elated. Affect is labile.        Speech: Speech is rapid and pressured and tangential.          Assessment & Plan:

## 2020-11-27 NOTE — Assessment & Plan Note (Signed)
She has psych meds refilled today.  She has psych f/u 6-28.

## 2020-11-27 NOTE — Assessment & Plan Note (Signed)
Will continue biktarvy. Other issues dominate appt today Will see her back in 1 month.

## 2020-11-28 ENCOUNTER — Other Ambulatory Visit (HOSPITAL_COMMUNITY): Payer: Self-pay

## 2020-12-02 ENCOUNTER — Other Ambulatory Visit (HOSPITAL_COMMUNITY): Payer: Self-pay

## 2020-12-02 ENCOUNTER — Telehealth: Payer: Self-pay

## 2020-12-02 NOTE — Telephone Encounter (Signed)
-----   Message from [employer] sent at 12/02/2020  2:43 PM EDT ----- Patient called to see if Dr. [name] sent in her prescriptions last Thursday at her visit (power went out during her appt.). She said [facility] has not received the order.

## 2020-12-02 NOTE — Telephone Encounter (Signed)
Spoke with patient, she states she realized medications were sent to [facility]. They told her it is not time for a refill on the Celexa or Trazodone until 12/09/20, patient is okay with this and wants to keep her medications at [facility].   Sandie Ano, RN

## 2020-12-08 ENCOUNTER — Other Ambulatory Visit (HOSPITAL_COMMUNITY): Payer: Self-pay

## 2020-12-09 ENCOUNTER — Other Ambulatory Visit (HOSPITAL_COMMUNITY): Payer: Self-pay

## 2020-12-25 ENCOUNTER — Ambulatory Visit: Payer: Medicare Other | Admitting: Infectious Diseases

## 2021-01-12 ENCOUNTER — Other Ambulatory Visit (HOSPITAL_COMMUNITY): Payer: Self-pay

## 2021-01-12 ENCOUNTER — Other Ambulatory Visit: Payer: Self-pay | Admitting: Infectious Diseases

## 2021-01-12 DIAGNOSIS — B20 Human immunodeficiency virus [HIV] disease: Secondary | ICD-10-CM

## 2021-01-12 DIAGNOSIS — F1410 Cocaine abuse, uncomplicated: Secondary | ICD-10-CM

## 2021-01-12 DIAGNOSIS — F1014 Alcohol abuse with alcohol-induced mood disorder: Secondary | ICD-10-CM

## 2021-01-13 ENCOUNTER — Other Ambulatory Visit: Payer: Self-pay | Admitting: Pharmacist

## 2021-01-13 ENCOUNTER — Other Ambulatory Visit (HOSPITAL_COMMUNITY): Payer: Self-pay

## 2021-01-13 DIAGNOSIS — B20 Human immunodeficiency virus [HIV] disease: Secondary | ICD-10-CM

## 2021-01-14 ENCOUNTER — Other Ambulatory Visit (HOSPITAL_COMMUNITY): Payer: Self-pay

## 2021-01-14 MED FILL — Bictegravir-Emtricitabine-Tenofovir AF Tab 50-200-25 MG: ORAL | 30 days supply | Qty: 30 | Fill #0 | Status: AC

## 2021-01-22 ENCOUNTER — Ambulatory Visit: Payer: Medicare Other | Admitting: Infectious Diseases

## 2021-02-04 ENCOUNTER — Other Ambulatory Visit (HOSPITAL_COMMUNITY): Payer: Self-pay

## 2021-02-04 ENCOUNTER — Other Ambulatory Visit: Payer: Self-pay | Admitting: Infectious Diseases

## 2021-02-04 DIAGNOSIS — F1410 Cocaine abuse, uncomplicated: Secondary | ICD-10-CM

## 2021-02-04 DIAGNOSIS — F1014 Alcohol abuse with alcohol-induced mood disorder: Secondary | ICD-10-CM

## 2021-02-04 MED FILL — Bictegravir-Emtricitabine-Tenofovir AF Tab 50-200-25 MG: ORAL | 30 days supply | Qty: 30 | Fill #1 | Status: AC

## 2021-02-06 ENCOUNTER — Other Ambulatory Visit (HOSPITAL_COMMUNITY): Payer: Self-pay

## 2021-02-06 ENCOUNTER — Other Ambulatory Visit: Payer: Self-pay | Admitting: Infectious Diseases

## 2021-02-06 DIAGNOSIS — F1410 Cocaine abuse, uncomplicated: Secondary | ICD-10-CM

## 2021-02-06 DIAGNOSIS — F1014 Alcohol abuse with alcohol-induced mood disorder: Secondary | ICD-10-CM

## 2021-02-09 ENCOUNTER — Other Ambulatory Visit (HOSPITAL_COMMUNITY): Payer: Self-pay

## 2021-02-09 ENCOUNTER — Other Ambulatory Visit: Payer: Self-pay | Admitting: Infectious Diseases

## 2021-02-09 MED ORDER — CITALOPRAM HYDROBROMIDE 10 MG PO TABS
10.0000 mg | ORAL_TABLET | Freq: Every day | ORAL | 1 refills | Status: DC
  Filled 2021-02-09: qty 30, 30d supply, fill #0

## 2021-02-09 NOTE — Telephone Encounter (Signed)
Please advise on refill. Has appt on 9/2

## 2021-02-13 ENCOUNTER — Ambulatory Visit: Payer: Medicare Other | Admitting: Infectious Diseases

## 2021-03-05 ENCOUNTER — Other Ambulatory Visit: Payer: Self-pay | Admitting: Pharmacist

## 2021-03-05 ENCOUNTER — Other Ambulatory Visit: Payer: Self-pay

## 2021-03-05 ENCOUNTER — Other Ambulatory Visit (HOSPITAL_COMMUNITY): Payer: Self-pay

## 2021-03-05 DIAGNOSIS — B20 Human immunodeficiency virus [HIV] disease: Secondary | ICD-10-CM

## 2021-03-05 MED ORDER — BIKTARVY 50-200-25 MG PO TABS
ORAL_TABLET | ORAL | 1 refills | Status: DC
  Filled 2021-03-05: qty 30, 30d supply, fill #0
  Filled 2021-04-20: qty 30, 30d supply, fill #1

## 2021-03-05 NOTE — Telephone Encounter (Signed)
Patient overdue for f/u. LVM to call back to schedule appt.

## 2021-03-09 ENCOUNTER — Other Ambulatory Visit (HOSPITAL_COMMUNITY): Payer: Self-pay

## 2021-03-17 ENCOUNTER — Ambulatory Visit: Payer: Medicare Other | Admitting: Infectious Diseases

## 2021-04-06 ENCOUNTER — Other Ambulatory Visit (HOSPITAL_COMMUNITY): Payer: Self-pay

## 2021-04-20 ENCOUNTER — Other Ambulatory Visit (HOSPITAL_COMMUNITY): Payer: Self-pay

## 2021-05-11 ENCOUNTER — Other Ambulatory Visit: Payer: Self-pay | Admitting: Infectious Diseases

## 2021-05-11 ENCOUNTER — Other Ambulatory Visit (HOSPITAL_COMMUNITY): Payer: Self-pay

## 2021-05-11 DIAGNOSIS — B20 Human immunodeficiency virus [HIV] disease: Secondary | ICD-10-CM

## 2021-05-11 MED ORDER — BIKTARVY 50-200-25 MG PO TABS
ORAL_TABLET | ORAL | 1 refills | Status: DC
  Filled 2021-05-11: qty 30, 30d supply, fill #0
  Filled 2021-06-10: qty 30, 30d supply, fill #1

## 2021-05-11 NOTE — Telephone Encounter (Signed)
Per outside history, patient only received one month of oxcarbazepine in June after being [facility]. I cannot find other records suggesting she has still be prescribed this medication. Needs to have follow-up rescheduled with Dr. [name] due to multiple cancellations/no-shows in order to check HIV RNA.

## 2021-05-11 NOTE — Telephone Encounter (Signed)
DDI Biktarvy and Oxcarbazepine, please advise.

## 2021-05-12 ENCOUNTER — Telehealth: Payer: Self-pay

## 2021-05-12 NOTE — Telephone Encounter (Signed)
Patient not taking Trileptal. Ok to refill.

## 2021-05-12 NOTE — Telephone Encounter (Signed)
Called patient to discuss possible drug-drug interaction with Biktarvy and Trileptal. She has not filled Trileptal since June and is no longer taking this medication. She confirms that she is taking Biktarvy, Celexa and OTC ibuprofen and Tylenol. I counseled her to not take Trileptal if she is prescribed this again and to let us know of any new medications that she is prescribed. All concerns and questions answered on this phone call.  Shirlee More, PharmD PGY2 Infectious Diseases Pharmacy Resident

## 2021-05-12 NOTE — Addendum Note (Signed)
Addended by: Robinette Haines on: 05/12/2021 10:22 AM   Modules accepted: Orders

## 2021-05-12 NOTE — Telephone Encounter (Signed)
Thank you :)

## 2021-05-15 ENCOUNTER — Other Ambulatory Visit (HOSPITAL_COMMUNITY): Payer: Self-pay

## 2021-05-25 ENCOUNTER — Ambulatory Visit
Admission: EM | Admit: 2021-05-25 | Discharge: 2021-05-25 | Disposition: A | Discharge status: Home / Self Care | Payer: Medicare Other | Attending: Family Medicine | Admitting: Family Medicine

## 2021-05-25 ENCOUNTER — Other Ambulatory Visit: Payer: Self-pay

## 2021-05-25 DIAGNOSIS — H66001 Acute suppurative otitis media without spontaneous rupture of ear drum, right ear: Secondary | ICD-10-CM

## 2021-05-25 DIAGNOSIS — J22 Unspecified acute lower respiratory infection: Secondary | ICD-10-CM

## 2021-05-25 MED ORDER — PROMETHAZINE-DM 6.25-15 MG/5ML PO SYRP: 5.0000 mL | ORAL_SOLUTION | Freq: Four times a day (QID) | ORAL | 0 refills | Status: DC | PRN

## 2021-05-25 MED ORDER — DEXAMETHASONE SODIUM PHOSPHATE 10 MG/ML IJ SOLN
10.0000 mg | Freq: Once | INTRAMUSCULAR | Status: AC
  Administered 2021-05-25: 10 mg via INTRAMUSCULAR

## 2021-05-25 MED ORDER — ALBUTEROL SULFATE HFA 108 (90 BASE) MCG/ACT IN AERS: 2.0000 | INHALATION_SPRAY | Freq: Four times a day (QID) | RESPIRATORY_TRACT | 0 refills | Status: DC | PRN

## 2021-05-25 MED ORDER — AMOXICILLIN-POT CLAVULANATE 875-125 MG PO TABS: 1.0000 | ORAL_TABLET | Freq: Two times a day (BID) | ORAL | 0 refills | Status: DC

## 2021-05-25 NOTE — ED Triage Notes (Signed)
Pt presents with c/o cough for past week and half, also has c/o right ear pain

## 2021-05-25 NOTE — ED Provider Notes (Signed)
[facility]    CSN: [id number] Arrival date & time: 05/25/21  3382      History   Chief Complaint Chief Complaint  Patient presents with   Cough   Otalgia    HPI CIARAH PEACE is a 50 y.o. female.   Presenting today with almost 2 weeks of progressively worsening nasal congestion, productive cough, chest tightness, wheezing, sinus pain and pressure, now also with stabbing right ear pain.  Has been having low-grade fevers off and on throughout duration of symptoms.  Taking over-the-counter cold and congestion medications, fever reducers with minimal temporary relief.  Has known history of smoking, HIV, lung nodule.  Not on any inhalers and has never been diagnosed with asthma, COPD in the past.   Past Medical History:  Diagnosis Date   Abnormal Pap smear    Had Cryo to treat   Allergy    Anemia    Anxiety    Cluster headaches    HIV infection (HCC)    Lung nodule    Vaginal venereal warts     Patient Active Problem List   Diagnosis Date Noted   Rectal prolapse 11/27/2020   Alcohol abuse with alcohol-induced mood disorder (HCC) 11/14/2020   Tick bite 10/30/2020   Alcohol abuse 12/18/2019   Lung nodule 11/16/2017   Cocaine abuse (HCC) 11/16/2017   Syphilis 07/18/2017   Essential hypertension 07/22/2014   Migraine 10/31/2006   Human immunodeficiency virus (HIV) disease (HCC) 06/27/2006   ANXIETY DISORDER, GENERALIZED 06/27/2006   TOBACCO ABUSE 06/27/2006    Past Surgical History:  Procedure Laterality Date   ABDOMINAL HYSTERECTOMY     KNEE ARTHROSCOPY Left    bakers cyst removal   TUBAL LIGATION      OB History     Gravida  7   Para  2   Term  2   Preterm      AB  5   Living  2      SAB  2   IAB  3   Ectopic      Multiple      Live Births               Home Medications    Prior to Admission medications   Medication Sig Start Date End Date Taking? Authorizing Provider  albuterol (VENTOLIN HFA) 108 (90 Base)  MCG/ACT inhaler Inhale 2 puffs into the lungs every 6 (six) hours as needed for wheezing or shortness of breath. 05/25/21  Yes Particia Nearing, PA-C  amoxicillin-clavulanate (AUGMENTIN) 875-125 MG tablet Take 1 tablet by mouth every 12 (twelve) hours. 05/25/21  Yes Particia Nearing, PA-C  promethazine-dextromethorphan (PROMETHAZINE-DM) 6.25-15 MG/5ML syrup Take 5 mLs by mouth 4 (four) times daily as needed. 05/25/21  Yes Particia Nearing, PA-C  acetaminophen (TYLENOL) 325 MG tablet Take 650 mg by mouth every 6 (six) hours as needed.    [provider]  bictegravir-emtricitabine-tenofovir AF (BIKTARVY) 50-200-25 MG TABS tablet TAKE 1 TABLET BY MOUTH DAILY. 05/11/21   [name], MD  citalopram (CELEXA) 10 MG tablet Take 1 tablet (10 mg total) by mouth daily. 02/09/21   [name], MD  fluticasone (FLONASE) 50 MCG/ACT nasal spray Place 2 sprays into both nostrils daily. 10/30/20   [name], MD  lisinopril (ZESTRIL) 5 MG tablet Take 1 tablet (5 mg total) by mouth daily. 11/27/20 11/27/21  [name], MD  traZODone (DESYREL) 50 MG tablet Take 1 tablet (50 mg total)  by mouth at bedtime as needed for sleep. 11/27/20   [name], MD  diphenhydramine-acetaminophen (TYLENOL PM) 25-500 MG TABS tablet Take 1 tablet by mouth at bedtime as needed (sleep).  08/23/19  [provider]    Family History Family History  Problem Relation Age of Onset   CAD Mother        MI 2016   Uterine cancer Mother    Cervical cancer Mother    Colon cancer Mother    Lung cancer Maternal Grandmother    Cancer Maternal Grandfather        larynx   Prostate cancer Maternal Grandfather    Prostate cancer Father    Breast cancer Neg Hx    Liver disease Neg Hx    Esophageal cancer Neg Hx    Stomach cancer Neg Hx    Colon polyps Neg Hx     Social History Social History   Tobacco Use   Smoking status: Every Day    Packs/day: 1.00    Years: 27.00     Pack years: 27.00    Types: Cigarettes   Smokeless tobacco: Never   Tobacco comments:    patient not ready to quit; considering patches  Vaping Use   Vaping Use: Never used  Substance Use Topics   Alcohol use: Yes    Alcohol/week: 0.0 standard drinks    Comment: "a lot every day"    Drug use: Yes    Frequency: 7.0 times per week    Types: Marijuana, Cocaine    Comment: not currently doing cocaine= Marijuana     Allergies   Doxycycline, Other, and Sulfonamide derivatives   Review of Systems Review of Systems Per HPI  Physical Exam Triage Vital Signs ED Triage Vitals  Enc Vitals Group     BP 05/25/21 0943 (!) 143/92     Pulse Rate 05/25/21 0943 80     Resp 05/25/21 0943 (!) 22     Temp 05/25/21 0943 98.2 F (36.8 C)     Temp Source 05/25/21 0943 Oral     SpO2 05/25/21 0943 94 %     Weight --      Height --      Head Circumference --      Peak Flow --      Pain Score 05/25/21 0955 4     Pain Loc --      Pain Edu? --      Excl. in GC? --    No data found.  Updated Vital Signs BP (!) 143/92 (BP Location: Right Arm)   Pulse 80   Temp 98.2 F (36.8 C) (Oral)   Resp (!) 22   SpO2 94%   Visual Acuity Right Eye Distance:   Left Eye Distance:   Bilateral Distance:    Right Eye Near:   Left Eye Near:    Bilateral Near:     Physical Exam Vitals and nursing note reviewed.  Constitutional:      Appearance: Normal appearance.  HENT:     Head: Atraumatic.     Left Ear: Tympanic membrane and external ear normal.     Ears:     Comments: Right TM erythematous    Nose: Congestion present.     Mouth/Throat:     Mouth: Mucous membranes are moist.     Pharynx: Posterior oropharyngeal erythema present.  Eyes:     Extraocular Movements: Extraocular movements intact.     Conjunctiva/sclera: Conjunctivae normal.  Cardiovascular:  Rate and Rhythm: Normal rate and regular rhythm.     Heart sounds: Normal heart sounds.  Pulmonary:     Effort: Pulmonary  effort is normal.     Breath sounds: Wheezing present.     Comments: Mild diffuse wheezes bilaterally Musculoskeletal:        General: Normal range of motion.     Cervical back: Normal range of motion and neck supple.  Skin:    General: Skin is warm and dry.  Neurological:     Mental Status: She is alert and oriented to person, place, and time.  Psychiatric:        Mood and Affect: Mood normal.        Thought Content: Thought content normal.     UC Treatments / Results  Labs (all labs ordered are listed, but only abnormal results are displayed) Labs Reviewed - No data to display  EKG   Radiology No results found.  Procedures Procedures (including critical care time)  Medications Ordered in UC Medications  dexamethasone (DECADRON) injection 10 mg (10 mg Intramuscular Given 05/25/21 1054)    Initial Impression / Assessment and Plan / UC Course  I have reviewed the triage vital signs and the nursing notes.  Pertinent labs & imaging results that were available during my care of the patient were reviewed by me and considered in my medical decision making (see chart for details).     Likely initially viral upper respiratory infection now transitioning into secondary sinusitis, right ear infection, lower respiratory tract infection.  We will treat with Augmentin, IM Decadron, albuterol, Phenergan DM.  Discussed supportive over-the-counter medications and home care.  Return for acutely worsening symptoms.  Final Clinical Impressions(s) / UC Diagnoses   Final diagnoses:  Non-recurrent acute suppurative otitis media of right ear without spontaneous rupture of tympanic membrane  Lower respiratory infection   Discharge Instructions   None    ED Prescriptions     Medication Sig Dispense Auth. Provider   amoxicillin-clavulanate (AUGMENTIN) 875-125 MG tablet Take 1 tablet by mouth every 12 (twelve) hours. 20 tablet Particia Nearing, [place]   albuterol (VENTOLIN HFA) 108  (90 Base) MCG/ACT inhaler Inhale 2 puffs into the lungs every 6 (six) hours as needed for wheezing or shortness of breath. 18 g Roosvelt Maser [place], [place]   promethazine-dextromethorphan (PROMETHAZINE-DM) 6.25-15 MG/5ML syrup Take 5 mLs by mouth 4 (four) times daily as needed. 100 mL Particia Nearing, [place]      PDMP not reviewed this encounter.   Particia Nearing, [place] 05/25/21 1222

## 2021-06-10 ENCOUNTER — Other Ambulatory Visit (HOSPITAL_COMMUNITY): Payer: Self-pay

## 2021-06-11 ENCOUNTER — Other Ambulatory Visit (HOSPITAL_COMMUNITY): Payer: Self-pay

## 2021-06-17 ENCOUNTER — Other Ambulatory Visit: Payer: Self-pay

## 2021-06-17 DIAGNOSIS — B20 Human immunodeficiency virus [HIV] disease: Secondary | ICD-10-CM

## 2021-06-18 ENCOUNTER — Other Ambulatory Visit: Payer: Medicare Other

## 2021-07-08 ENCOUNTER — Other Ambulatory Visit: Payer: Self-pay | Admitting: Infectious Diseases

## 2021-07-08 ENCOUNTER — Other Ambulatory Visit (HOSPITAL_COMMUNITY): Payer: Self-pay

## 2021-07-08 DIAGNOSIS — B20 Human immunodeficiency virus [HIV] disease: Secondary | ICD-10-CM

## 2021-07-08 MED ORDER — BIKTARVY 50-200-25 MG PO TABS
ORAL_TABLET | ORAL | 0 refills | Status: DC
  Filled 2021-07-08: qty 30, 30d supply, fill #0

## 2021-07-10 ENCOUNTER — Other Ambulatory Visit (HOSPITAL_COMMUNITY): Payer: Self-pay

## 2021-07-17 ENCOUNTER — Encounter: Payer: Medicare Other | Admitting: Infectious Diseases

## 2021-08-03 ENCOUNTER — Other Ambulatory Visit (HOSPITAL_COMMUNITY): Payer: Self-pay

## 2021-08-05 ENCOUNTER — Other Ambulatory Visit (HOSPITAL_COMMUNITY): Payer: Self-pay

## 2021-08-07 ENCOUNTER — Other Ambulatory Visit (HOSPITAL_COMMUNITY): Payer: Self-pay

## 2021-08-07 ENCOUNTER — Other Ambulatory Visit: Payer: Self-pay | Admitting: Infectious Diseases

## 2021-08-07 DIAGNOSIS — B20 Human immunodeficiency virus [HIV] disease: Secondary | ICD-10-CM

## 2021-08-07 MED ORDER — BIKTARVY 50-200-25 MG PO TABS
ORAL_TABLET | ORAL | 1 refills | Status: DC
  Filled 2021-08-07: qty 30, 30d supply, fill #0
  Filled 2021-09-01: qty 30, 30d supply, fill #1

## 2021-08-10 ENCOUNTER — Other Ambulatory Visit (HOSPITAL_COMMUNITY): Payer: Self-pay

## 2021-08-14 ENCOUNTER — Other Ambulatory Visit: Payer: Self-pay

## 2021-08-14 ENCOUNTER — Other Ambulatory Visit: Payer: Medicare Other

## 2021-08-14 ENCOUNTER — Telehealth: Payer: Self-pay

## 2021-08-14 DIAGNOSIS — B20 Human immunodeficiency virus [HIV] disease: Secondary | ICD-10-CM

## 2021-08-14 NOTE — Telephone Encounter (Signed)
Called to cancel Lab only appt due to intoxication. Asking to go to [facility] Monday. I have updated lab orders to future and resulted agency [facility].  ?

## 2021-08-17 ENCOUNTER — Other Ambulatory Visit (HOSPITAL_COMMUNITY)
Admission: RE | Admit: 2021-08-17 | Discharge: 2021-08-17 | Disposition: A | Discharge status: Home / Self Care | Payer: Medicare Other | Source: Ambulatory Visit | Attending: Infectious Diseases | Admitting: Infectious Diseases

## 2021-08-17 DIAGNOSIS — B20 Human immunodeficiency virus [HIV] disease: Secondary | ICD-10-CM

## 2021-08-17 LAB — CBC WITH DIFFERENTIAL/PLATELET
Abs Immature Granulocytes: 0.01 10*3/uL (ref 0.00–0.07)
Basophils Absolute: 0 10*3/uL (ref 0.0–0.1)
Basophils Relative: 1 %
Eosinophils Absolute: 0 10*3/uL (ref 0.0–0.5)
Eosinophils Relative: 1 %
HCT: 38 % (ref 36.0–46.0)
Hemoglobin: 12.9 g/dL (ref 12.0–15.0)
Immature Granulocytes: 0 %
Lymphocytes Relative: 31 %
Lymphs Abs: 1.5 10*3/uL (ref 0.7–4.0)
MCH: 32.9 pg (ref 26.0–34.0)
MCHC: 33.9 g/dL (ref 30.0–36.0)
MCV: 96.9 fL (ref 80.0–100.0)
Monocytes Absolute: 0.3 10*3/uL (ref 0.1–1.0)
Monocytes Relative: 6 %
Neutro Abs: 3 10*3/uL (ref 1.7–7.7)
Neutrophils Relative %: 61 %
Platelets: 230 10*3/uL (ref 150–400)
RBC: 3.92 MIL/uL (ref 3.87–5.11)
RDW: 12.8 % (ref 11.5–15.5)
WBC: 4.9 10*3/uL (ref 4.0–10.5)
nRBC: 0 % (ref 0.0–0.2)

## 2021-08-17 LAB — COMPREHENSIVE METABOLIC PANEL
ALT: 28 U/L (ref 0–44)
AST: 36 U/L (ref 15–41)
Albumin: 4.5 g/dL (ref 3.5–5.0)
Alkaline Phosphatase: 62 U/L (ref 38–126)
Anion gap: 9 (ref 5–15)
BUN: 19 mg/dL (ref 6–20)
CO2: 24 mmol/L (ref 22–32)
Calcium: 8.6 mg/dL — ABNORMAL LOW (ref 8.9–10.3)
Chloride: 105 mmol/L (ref 98–111)
Creatinine, Ser: 0.79 mg/dL (ref 0.44–1.00)
GFR, Estimated: >60 mL/min (ref >60)
Glucose, Bld: 122 mg/dL — ABNORMAL HIGH (ref 70–99)
Potassium: 3.7 mmol/L (ref 3.5–5.1)
Sodium: 138 mmol/L (ref 135–145)
Total Bilirubin: 0.8 mg/dL (ref 0.3–1.2)
Total Protein: 7.2 g/dL (ref 6.5–8.1)

## 2021-08-18 LAB — T-HELPER CELLS CD4/CD8 %
% CD 4 Pos. Lymph.: 28.3 % — ABNORMAL LOW (ref 30.8–58.5)
Absolute CD 4 Helper: 425 /uL (ref 359–1519)
Basophils Absolute: 0 10*3/uL (ref 0.0–0.2)
Basos: 1 %
CD3+CD4+ Cells/CD3+CD8+ Cells Bld: 0.51 — ABNORMAL LOW (ref 0.92–3.72)
CD3+CD8+ Cells # Bld: 839 /uL (ref 109–897)
CD3+CD8+ Cells NFr Bld: 55.9 % — ABNORMAL HIGH (ref 12.0–35.5)
EOS (ABSOLUTE): 0 10*3/uL (ref 0.0–0.4)
Eos: 1 %
Hematocrit: 38.9 % (ref 34.0–46.6)
Hemoglobin: 13.4 g/dL (ref 11.1–15.9)
Immature Grans (Abs): 0 10*3/uL (ref 0.0–0.1)
Immature Granulocytes: 0 %
Lymphocytes Absolute: 1.5 10*3/uL (ref 0.7–3.1)
Lymphs: 31 %
MCH: 33.7 pg — ABNORMAL HIGH (ref 26.6–33.0)
MCHC: 34.4 g/dL (ref 31.5–35.7)
MCV: 98 fL — ABNORMAL HIGH (ref 79–97)
Monocytes Absolute: 0.3 10*3/uL (ref 0.1–0.9)
Monocytes: 6 %
Neutrophils Absolute: 2.9 10*3/uL (ref 1.4–7.0)
Neutrophils: 61 %
Platelets: 246 10*3/uL (ref 150–450)
RBC: 3.98 x10E6/uL (ref 3.77–5.28)
RDW: 13.1 % (ref 11.7–15.4)
WBC: 4.8 10*3/uL (ref 3.4–10.8)

## 2021-08-18 LAB — HIV-1 RNA QUANT-NO REFLEX-BLD
HIV 1 RNA Quant: 30 copies/mL
LOG10 HIV-1 RNA: 1.477 log10copy/mL

## 2021-08-28 ENCOUNTER — Ambulatory Visit (INDEPENDENT_AMBULATORY_CARE_PROVIDER_SITE_OTHER): Payer: Medicare Other | Admitting: Infectious Diseases

## 2021-08-28 ENCOUNTER — Encounter: Payer: Self-pay | Admitting: Infectious Diseases

## 2021-08-28 ENCOUNTER — Ambulatory Visit (INDEPENDENT_AMBULATORY_CARE_PROVIDER_SITE_OTHER): Payer: Medicare Other

## 2021-08-28 ENCOUNTER — Other Ambulatory Visit: Payer: Self-pay

## 2021-08-28 VITALS — BP 171/110 | HR 53 | Temp 97.8°F | Resp 16 | Ht 64.0 in | Wt 212.0 lb

## 2021-08-28 DIAGNOSIS — K623 Rectal prolapse: Secondary | ICD-10-CM

## 2021-08-28 DIAGNOSIS — F17200 Nicotine dependence, unspecified, uncomplicated: Secondary | ICD-10-CM | POA: Diagnosis not present

## 2021-08-28 DIAGNOSIS — B20 Human immunodeficiency virus [HIV] disease: Secondary | ICD-10-CM

## 2021-08-28 DIAGNOSIS — I10 Essential (primary) hypertension: Secondary | ICD-10-CM

## 2021-08-28 DIAGNOSIS — F1014 Alcohol abuse with alcohol-induced mood disorder: Secondary | ICD-10-CM | POA: Diagnosis not present

## 2021-08-28 DIAGNOSIS — Z1231 Encounter for screening mammogram for malignant neoplasm of breast: Secondary | ICD-10-CM | POA: Diagnosis present

## 2021-08-28 DIAGNOSIS — Z23 Encounter for immunization: Secondary | ICD-10-CM

## 2021-08-28 MED ORDER — LISINOPRIL 5 MG PO TABS: 5.0000 mg | ORAL_TABLET | Freq: Every day | ORAL | 11 refills | Status: DC

## 2021-08-28 NOTE — Assessment & Plan Note (Addendum)
Will get her in with surgery, GYN, mammo ?Still smoking marijuana.  ?Not sexually active.  ?COVID and Flu vax today.  ?rx for shingles vax.  ?Will get her in with mental health ?rtc in 1 month for BP f/u.  ?

## 2021-08-28 NOTE — Assessment & Plan Note (Signed)
Resend surgery eval.  ?

## 2021-08-28 NOTE — Assessment & Plan Note (Signed)
Has quit drinking due to anger issues ?Will get her in with counseling.  ?

## 2021-08-28 NOTE — Progress Notes (Signed)
? ?  Covid-19 Vaccination Clinic ? ?Name:  Lori [name]    ?MRN: [id number] ?DOB: [birth year] ? ?08/28/2021 ? ?Lori [name] was observed post Covid-19 immunization for 15 minutes without incident. She was provided with Vaccine Information Sheet and instruction to access the V-Safe system.  ? ?Lori [name] was instructed to call 911 with any severe reactions post vaccine: ?Difficulty breathing  ?Swelling of face and throat  ?A fast heartbeat  ?A bad rash all over body  ?Dizziness and weakness  ? ? Jonelle Sports ? ? ?

## 2021-08-28 NOTE — Progress Notes (Signed)
? ?Subjective:  ? ? Patient ID: Lori [name], female  DOB: 12-19-1970, 51 y.o.        MRN: [id number] ? ? ?HPI ?51 yo F with HIV+ (dx 2004), anxiety d/o, anal/vaginal warts, AIN1 (HPV+), tobacco use.   ?Previously on atripla (made her depressed suicidal).  tried odefsy but couldn't tolerate. Then changed to genvoya which she went off of . States it makes her throw up. At her f/u 11-2017, she was changed to biktarvy.   ?  ?Son [name], born 2004, HIV -. Was in traffic court yesterday. ?Oldest son in prison, Brother living with her now as well, takes little care of himself. He is bipolar, hypertensive, had 2 strokes. ?  ?Has quit drinking- I was getting angry.  ?  ?She was in [facility] psych ed 11-14-20 in ETOH/drug w/d (smoked a bunch of crack, drank a bunch of vodka).  ?Was seen for OM in ED 05-2021.  ?Was given celexa 10mg , trazadone 50mg  (for sleep).  ?Would like lisinopril refill as well. Ran out of this and needs refill. Checks BP at home, last was 208/something.  ? ?More wt gain, no sex drive (compared to before 50).  ?  ?Has concerns today about rectal prolapse, is getting worse. Needs another referral. Didn't want surgery prior. Is able to reduce, has lately felt more resistance in getting pushed back in.  ? ?HIV 1 RNA Quant  ?Date Value  ?08/17/2021 30 copies/mL  ?10/30/2020 Not Detected Copies/mL  ?12/03/2019 30 copies/mL (H)  ? ?CD4 T Cell Abs (/uL)  ?Date Value  ?10/30/2020 570  ?12/03/2019 503  ?11/13/2018 664  ? ?CD4 425 (08-17-21) ? ?Health Maintenance  ?Topic Date Due  ?? Zoster Vaccines- Shingrix (1 of 2) Never done  ?? COLONOSCOPY (Pts 45-81yrs Insurance coverage will need to be confirmed)  Never done  ?? COVID-19 Vaccine (3 - Booster for Janssen series) 06/10/2020  ?? PAP SMEAR-Modifier  12/17/2020  ?? MAMMOGRAM  12/20/2020  ?? INFLUENZA VACCINE  01/12/2021  ?? TETANUS/TDAP  11/17/2027  ?? Hepatitis C Screening  Completed  ?? HIV Screening  Completed  ?? HPV VACCINES  Aged Out  ? ? ? ? ?Review of Systems   ?Constitutional:  Negative for chills, fever and weight loss.  ?Respiratory:  Positive for cough. Negative for shortness of breath.   ?Cardiovascular:  Positive for chest pain (had this Am, improved after eating.).  ?Gastrointestinal:  Positive for blood in stool. Negative for constipation and diarrhea.  ?Genitourinary:  Negative for dysuria.  ?Neurological:  Positive for headaches.  ?Psychiatric/Behavioral:  The patient is nervous/anxious.   ? ?Please see HPI. All other systems reviewed and negative. ? ?   ?Objective:  ?Physical Exam ?Vitals reviewed.  ?Constitutional:   ?   Appearance: Normal appearance. She is obese.  ?HENT:  ?   Mouth/Throat:  ?   Mouth: Mucous membranes are moist.  ?   Pharynx: No oropharyngeal exudate.  ?Eyes:  ?   Extraocular Movements: Extraocular movements intact.  ?   Pupils: Pupils are equal, round, and reactive to light.  ?Cardiovascular:  ?   Rate and Rhythm: Normal rate and regular rhythm.  ?Pulmonary:  ?   Effort: Pulmonary effort is normal.  ?   Breath sounds: Normal breath sounds.  ?Musculoskeletal:     ?   General: Normal range of motion.  ?   Cervical back: Normal range of motion and neck supple.  ?   Right lower leg: No edema.  ?  Left lower leg: No edema.  ?Neurological:  ?   Mental Status: She is alert.  ? ? ? ? ? ?   ?Assessment & Plan:  ? ?

## 2021-08-28 NOTE — Assessment & Plan Note (Signed)
Encouraged to quit. 

## 2021-08-28 NOTE — Addendum Note (Signed)
Addended by: Clayborne Artist A on: 08/28/2021 11:16 AM ? ? Modules accepted: Orders ? ?

## 2021-08-31 ENCOUNTER — Other Ambulatory Visit (HOSPITAL_COMMUNITY): Payer: Self-pay

## 2021-09-01 ENCOUNTER — Other Ambulatory Visit (HOSPITAL_COMMUNITY): Payer: Self-pay

## 2021-09-04 ENCOUNTER — Other Ambulatory Visit: Payer: Self-pay

## 2021-09-04 ENCOUNTER — Other Ambulatory Visit (HOSPITAL_COMMUNITY): Payer: Self-pay

## 2021-09-04 DIAGNOSIS — I10 Essential (primary) hypertension: Secondary | ICD-10-CM

## 2021-09-04 MED ORDER — LISINOPRIL 5 MG PO TABS
5.0000 mg | ORAL_TABLET | Freq: Every day | ORAL | 11 refills | Status: DC
  Filled 2021-09-04: qty 90, 90d supply, fill #0

## 2021-09-04 MED ORDER — LISINOPRIL 5 MG PO TABS
5.0000 mg | ORAL_TABLET | Freq: Every day | ORAL | 1 refills | Status: DC
  Filled 2021-09-04 (×2): qty 90, 90d supply, fill #0
  Filled 2021-11-30: qty 90, 90d supply, fill #1

## 2021-09-29 ENCOUNTER — Other Ambulatory Visit (HOSPITAL_COMMUNITY): Payer: Self-pay

## 2021-09-29 ENCOUNTER — Other Ambulatory Visit: Payer: Self-pay | Admitting: Infectious Diseases

## 2021-09-29 DIAGNOSIS — B20 Human immunodeficiency virus [HIV] disease: Secondary | ICD-10-CM

## 2021-09-29 MED ORDER — BIKTARVY 50-200-25 MG PO TABS
ORAL_TABLET | ORAL | 1 refills | Status: DC
  Filled 2021-09-29: qty 30, 30d supply, fill #0
  Filled 2021-10-28: qty 30, 30d supply, fill #1

## 2021-10-02 ENCOUNTER — Other Ambulatory Visit (HOSPITAL_COMMUNITY): Payer: Self-pay

## 2021-10-06 ENCOUNTER — Other Ambulatory Visit: Payer: Medicare Other | Admitting: Obstetrics & Gynecology

## 2021-10-16 ENCOUNTER — Ambulatory Visit: Payer: Medicare Other | Admitting: Infectious Diseases

## 2021-10-16 ENCOUNTER — Telehealth: Payer: Self-pay | Admitting: Infectious Diseases

## 2021-10-16 NOTE — Telephone Encounter (Signed)
Pt called and left msg on 10/15/2021 in regards of appt cancellation for appt on 10/16/2021. Called and left vm on 10/15/2021 & 10/16/2021 to confirm, cancel, and attempt an appt resched.  ?

## 2021-10-28 ENCOUNTER — Other Ambulatory Visit (HOSPITAL_COMMUNITY): Payer: Self-pay

## 2021-11-02 ENCOUNTER — Other Ambulatory Visit (HOSPITAL_COMMUNITY): Payer: Self-pay

## 2021-11-15 ENCOUNTER — Encounter: Payer: Self-pay | Admitting: Infectious Diseases

## 2021-11-24 ENCOUNTER — Other Ambulatory Visit (HOSPITAL_COMMUNITY): Payer: Self-pay

## 2021-11-26 ENCOUNTER — Other Ambulatory Visit (HOSPITAL_COMMUNITY): Payer: Self-pay

## 2021-11-30 ENCOUNTER — Other Ambulatory Visit: Payer: Medicare Other | Admitting: Obstetrics and Gynecology

## 2021-11-30 ENCOUNTER — Other Ambulatory Visit (HOSPITAL_COMMUNITY): Payer: Self-pay

## 2021-11-30 ENCOUNTER — Other Ambulatory Visit: Payer: Self-pay | Admitting: Infectious Diseases

## 2021-11-30 DIAGNOSIS — B20 Human immunodeficiency virus [HIV] disease: Secondary | ICD-10-CM

## 2021-12-01 ENCOUNTER — Other Ambulatory Visit (HOSPITAL_COMMUNITY): Payer: Self-pay

## 2021-12-01 MED ORDER — BIKTARVY 50-200-25 MG PO TABS
ORAL_TABLET | ORAL | 1 refills | Status: DC
  Filled 2021-12-01: qty 30, 30d supply, fill #0
  Filled 2021-12-24: qty 30, 30d supply, fill #1

## 2021-12-02 ENCOUNTER — Other Ambulatory Visit (HOSPITAL_COMMUNITY): Payer: Self-pay

## 2021-12-18 ENCOUNTER — Other Ambulatory Visit: Payer: Medicare Other | Admitting: Obstetrics & Gynecology

## 2021-12-22 ENCOUNTER — Other Ambulatory Visit (HOSPITAL_COMMUNITY): Payer: Self-pay

## 2021-12-24 ENCOUNTER — Other Ambulatory Visit (HOSPITAL_COMMUNITY): Payer: Self-pay

## 2021-12-28 ENCOUNTER — Other Ambulatory Visit (HOSPITAL_COMMUNITY): Payer: Self-pay

## 2022-01-14 ENCOUNTER — Other Ambulatory Visit: Payer: Medicare Other | Admitting: Obstetrics & Gynecology

## 2022-01-19 ENCOUNTER — Other Ambulatory Visit (HOSPITAL_COMMUNITY): Payer: Self-pay

## 2022-01-25 ENCOUNTER — Other Ambulatory Visit (HOSPITAL_COMMUNITY): Payer: Self-pay

## 2022-02-08 ENCOUNTER — Other Ambulatory Visit: Payer: Self-pay | Admitting: Infectious Diseases

## 2022-02-08 ENCOUNTER — Other Ambulatory Visit (HOSPITAL_COMMUNITY): Payer: Self-pay

## 2022-02-08 DIAGNOSIS — B20 Human immunodeficiency virus [HIV] disease: Secondary | ICD-10-CM

## 2022-02-08 NOTE — Telephone Encounter (Signed)
Attempted to call patient to confirm if she will continue with Dr. [name]. Unable to leave message at mobile number. Left HIPAA-compliant message at home number requesting call back.  Wyvonne Lenz, RN

## 2022-02-09 ENCOUNTER — Other Ambulatory Visit (HOSPITAL_COMMUNITY): Payer: Self-pay

## 2022-02-09 ENCOUNTER — Other Ambulatory Visit: Payer: Self-pay | Admitting: Infectious Diseases

## 2022-02-09 DIAGNOSIS — B20 Human immunodeficiency virus [HIV] disease: Secondary | ICD-10-CM

## 2022-02-09 MED ORDER — BIKTARVY 50-200-25 MG PO TABS
ORAL_TABLET | ORAL | 1 refills | Status: DC
  Filled 2022-02-09: qty 30, 30d supply, fill #0
  Filled 2022-03-04: qty 30, 30d supply, fill #1

## 2022-03-03 ENCOUNTER — Other Ambulatory Visit (HOSPITAL_COMMUNITY): Payer: Self-pay

## 2022-03-04 ENCOUNTER — Other Ambulatory Visit: Payer: Self-pay | Admitting: Infectious Diseases

## 2022-03-04 ENCOUNTER — Other Ambulatory Visit (HOSPITAL_COMMUNITY): Payer: Self-pay

## 2022-03-04 DIAGNOSIS — I10 Essential (primary) hypertension: Secondary | ICD-10-CM

## 2022-03-09 ENCOUNTER — Other Ambulatory Visit (HOSPITAL_COMMUNITY): Payer: Self-pay

## 2022-03-09 MED ORDER — LISINOPRIL 5 MG PO TABS
5.0000 mg | ORAL_TABLET | Freq: Every day | ORAL | 1 refills | Status: DC
  Filled 2022-03-09: qty 90, 90d supply, fill #0

## 2022-03-11 ENCOUNTER — Other Ambulatory Visit (HOSPITAL_COMMUNITY): Payer: Self-pay

## 2022-03-17 ENCOUNTER — Other Ambulatory Visit: Payer: Self-pay | Admitting: Infectious Diseases

## 2022-03-17 ENCOUNTER — Other Ambulatory Visit: Payer: Medicare Other

## 2022-03-17 DIAGNOSIS — Z79899 Other long term (current) drug therapy: Secondary | ICD-10-CM

## 2022-03-17 DIAGNOSIS — Z113 Encounter for screening for infections with a predominantly sexual mode of transmission: Secondary | ICD-10-CM

## 2022-03-17 DIAGNOSIS — B20 Human immunodeficiency virus [HIV] disease: Secondary | ICD-10-CM

## 2022-03-25 ENCOUNTER — Other Ambulatory Visit (HOSPITAL_COMMUNITY): Payer: Self-pay

## 2022-03-26 ENCOUNTER — Other Ambulatory Visit (HOSPITAL_COMMUNITY): Payer: Self-pay

## 2022-03-26 ENCOUNTER — Other Ambulatory Visit: Payer: Self-pay | Admitting: Infectious Diseases

## 2022-03-26 DIAGNOSIS — B20 Human immunodeficiency virus [HIV] disease: Secondary | ICD-10-CM

## 2022-03-26 MED ORDER — BIKTARVY 50-200-25 MG PO TABS
ORAL_TABLET | ORAL | 1 refills | Status: DC
  Filled 2022-03-26: qty 30, 30d supply, fill #0
  Filled 2022-05-19: qty 30, 30d supply, fill #1

## 2022-03-29 ENCOUNTER — Other Ambulatory Visit (HOSPITAL_COMMUNITY): Payer: Self-pay

## 2022-03-31 ENCOUNTER — Encounter: Payer: Medicare Other | Admitting: Infectious Diseases

## 2022-04-05 ENCOUNTER — Other Ambulatory Visit (HOSPITAL_COMMUNITY): Payer: Self-pay

## 2022-04-27 ENCOUNTER — Other Ambulatory Visit (HOSPITAL_COMMUNITY): Payer: Self-pay

## 2022-04-30 ENCOUNTER — Other Ambulatory Visit (HOSPITAL_COMMUNITY): Payer: Self-pay

## 2022-05-04 ENCOUNTER — Other Ambulatory Visit (HOSPITAL_COMMUNITY): Payer: Self-pay

## 2022-05-15 ENCOUNTER — Encounter (HOSPITAL_COMMUNITY): Payer: Self-pay

## 2022-05-15 ENCOUNTER — Other Ambulatory Visit: Payer: Self-pay

## 2022-05-15 ENCOUNTER — Emergency Department (EMERGENCY_DEPARTMENT_HOSPITAL)
Admission: EM | Admit: 2022-05-15 | Discharge: 2022-05-16 | Disposition: A | Discharge status: Other Acute Inpatient Hospital | Payer: Medicare Other | Source: Home / Self Care | Attending: Emergency Medicine | Admitting: Emergency Medicine

## 2022-05-15 DIAGNOSIS — R45850 Homicidal ideations: Secondary | ICD-10-CM

## 2022-05-15 DIAGNOSIS — F209 Schizophrenia, unspecified: Secondary | ICD-10-CM | POA: Insufficient documentation

## 2022-05-15 DIAGNOSIS — R45851 Suicidal ideations: Secondary | ICD-10-CM | POA: Insufficient documentation

## 2022-05-15 DIAGNOSIS — I10 Essential (primary) hypertension: Secondary | ICD-10-CM | POA: Diagnosis not present

## 2022-05-15 DIAGNOSIS — F319 Bipolar disorder, unspecified: Secondary | ICD-10-CM | POA: Insufficient documentation

## 2022-05-15 DIAGNOSIS — F29 Unspecified psychosis not due to a substance or known physiological condition: Secondary | ICD-10-CM | POA: Diagnosis not present

## 2022-05-15 DIAGNOSIS — F1910 Other psychoactive substance abuse, uncomplicated: Secondary | ICD-10-CM

## 2022-05-15 DIAGNOSIS — R457 State of emotional shock and stress, unspecified: Secondary | ICD-10-CM | POA: Diagnosis not present

## 2022-05-15 DIAGNOSIS — F1924 Other psychoactive substance dependence with psychoactive substance-induced mood disorder: Secondary | ICD-10-CM | POA: Insufficient documentation

## 2022-05-15 DIAGNOSIS — F32A Depression, unspecified: Secondary | ICD-10-CM | POA: Diagnosis not present

## 2022-05-15 DIAGNOSIS — Z20822 Contact with and (suspected) exposure to covid-19: Secondary | ICD-10-CM | POA: Insufficient documentation

## 2022-05-15 LAB — CBC WITH DIFFERENTIAL/PLATELET
Abs Immature Granulocytes: 0.02 10*3/uL (ref 0.00–0.07)
Basophils Absolute: 0.1 10*3/uL (ref 0.0–0.1)
Basophils Relative: 1 %
Eosinophils Absolute: 0.1 10*3/uL (ref 0.0–0.5)
Eosinophils Relative: 1 %
HCT: 40.9 % (ref 36.0–46.0)
Hemoglobin: 14.2 g/dL (ref 12.0–15.0)
Immature Granulocytes: 0 %
Lymphocytes Relative: 22 %
Lymphs Abs: 1.8 10*3/uL (ref 0.7–4.0)
MCH: 32.8 pg (ref 26.0–34.0)
MCHC: 34.7 g/dL (ref 30.0–36.0)
MCV: 94.5 fL (ref 80.0–100.0)
Monocytes Absolute: 0.6 10*3/uL (ref 0.1–1.0)
Monocytes Relative: 7 %
Neutro Abs: 5.6 10*3/uL (ref 1.7–7.7)
Neutrophils Relative %: 69 %
Platelets: 261 10*3/uL (ref 150–400)
RBC: 4.33 MIL/uL (ref 3.87–5.11)
RDW: 12.4 % (ref 11.5–15.5)
WBC: 8.1 10*3/uL (ref 4.0–10.5)
nRBC: 0 % (ref 0.0–0.2)

## 2022-05-15 LAB — RAPID URINE DRUG SCREEN, HOSP PERFORMED
Amphetamines: NOT DETECTED
Barbiturates: NOT DETECTED
Benzodiazepines: NOT DETECTED
Cocaine: POSITIVE — AB
Opiates: NOT DETECTED
Tetrahydrocannabinol: POSITIVE — AB

## 2022-05-15 LAB — COMPREHENSIVE METABOLIC PANEL
ALT: 29 U/L (ref 0–44)
AST: 53 U/L — ABNORMAL HIGH (ref 15–41)
Albumin: 4.8 g/dL (ref 3.5–5.0)
Alkaline Phosphatase: 63 U/L (ref 38–126)
Anion gap: 11 (ref 5–15)
BUN: 16 mg/dL (ref 6–20)
CO2: 24 mmol/L (ref 22–32)
Calcium: 9.5 mg/dL (ref 8.9–10.3)
Chloride: 103 mmol/L (ref 98–111)
Creatinine, Ser: 0.83 mg/dL (ref 0.44–1.00)
GFR, Estimated: >60 mL/min (ref >60)
Glucose, Bld: 104 mg/dL — ABNORMAL HIGH (ref 70–99)
Potassium: 3.4 mmol/L — ABNORMAL LOW (ref 3.5–5.1)
Sodium: 138 mmol/L (ref 135–145)
Total Bilirubin: 1 mg/dL (ref 0.3–1.2)
Total Protein: 7.7 g/dL (ref 6.5–8.1)

## 2022-05-15 LAB — ETHANOL: Alcohol, Ethyl (B): <10 mg/dL (ref <10)

## 2022-05-15 LAB — POC URINE PREG, ED: Preg Test, Ur: NEGATIVE

## 2022-05-15 LAB — SARS CORONAVIRUS 2 BY RT PCR: SARS Coronavirus 2 by RT PCR: NEGATIVE

## 2022-05-15 MED ORDER — ONDANSETRON HCL 4 MG PO TABS: 4.0000 mg | ORAL_TABLET | Freq: Three times a day (TID) | ORAL | Status: DC | PRN

## 2022-05-15 MED ORDER — NICOTINE 21 MG/24HR TD PT24
21.0000 mg | MEDICATED_PATCH | Freq: Every day | TRANSDERMAL | Status: DC
  Administered 2022-05-15: 21 mg via TRANSDERMAL
  Filled 2022-05-15: qty 1

## 2022-05-15 MED ORDER — FLUTICASONE PROPIONATE 50 MCG/ACT NA SUSP
2.0000 | Freq: Every day | NASAL | Status: DC
  Administered 2022-05-15: 2 via NASAL
  Filled 2022-05-15: qty 16

## 2022-05-15 MED ORDER — RISPERIDONE 1 MG PO TBDP
2.0000 mg | ORAL_TABLET | Freq: Three times a day (TID) | ORAL | Status: DC | PRN
  Administered 2022-05-15: 2 mg via ORAL
  Filled 2022-05-15: qty 2

## 2022-05-15 MED ORDER — BICTEGRAVIR-EMTRICITAB-TENOFOV 50-200-25 MG PO TABS
1.0000 | ORAL_TABLET | Freq: Every day | ORAL | Status: DC
  Administered 2022-05-15: 1 via ORAL
  Filled 2022-05-15: qty 1

## 2022-05-15 MED ORDER — CITALOPRAM HYDROBROMIDE 10 MG PO TABS
10.0000 mg | ORAL_TABLET | Freq: Every day | ORAL | Status: DC
  Administered 2022-05-15: 10 mg via ORAL
  Filled 2022-05-15: qty 1

## 2022-05-15 MED ORDER — ZOLPIDEM TARTRATE 5 MG PO TABS: 5.0000 mg | ORAL_TABLET | Freq: Every evening | ORAL | Status: DC | PRN

## 2022-05-15 MED ORDER — TRAZODONE HCL 50 MG PO TABS
50.0000 mg | ORAL_TABLET | Freq: Every day | ORAL | Status: DC
  Administered 2022-05-15: 50 mg via ORAL
  Filled 2022-05-15: qty 1

## 2022-05-15 MED ORDER — ACETAMINOPHEN 325 MG PO TABS
650.0000 mg | ORAL_TABLET | ORAL | Status: DC | PRN
  Administered 2022-05-15 (×2): 650 mg via ORAL
  Filled 2022-05-15 (×2): qty 2

## 2022-05-15 MED ORDER — LORAZEPAM 1 MG PO TABS: 0.0000 mg | ORAL_TABLET | Freq: Two times a day (BID) | ORAL | Status: DC

## 2022-05-15 MED ORDER — THIAMINE HCL 100 MG/ML IJ SOLN: 100.0000 mg | Freq: Every day | INTRAMUSCULAR | Status: DC

## 2022-05-15 MED ORDER — LORAZEPAM 1 MG PO TABS: 1.0000 mg | ORAL_TABLET | ORAL | Status: DC | PRN

## 2022-05-15 MED ORDER — LISINOPRIL 5 MG PO TABS
5.0000 mg | ORAL_TABLET | Freq: Every day | ORAL | Status: DC
  Administered 2022-05-15: 5 mg via ORAL
  Filled 2022-05-15: qty 1

## 2022-05-15 MED ORDER — LORAZEPAM 1 MG PO TABS
0.0000 mg | ORAL_TABLET | Freq: Four times a day (QID) | ORAL | Status: DC
  Administered 2022-05-15: 1 mg via ORAL
  Filled 2022-05-15: qty 1

## 2022-05-15 MED ORDER — HYDROXYZINE HCL 25 MG PO TABS: 25.0000 mg | ORAL_TABLET | Freq: Three times a day (TID) | ORAL | Status: DC | PRN

## 2022-05-15 MED ORDER — LORAZEPAM 2 MG/ML IJ SOLN: 0.0000 mg | Freq: Four times a day (QID) | INTRAMUSCULAR | Status: DC

## 2022-05-15 MED ORDER — ZIPRASIDONE MESYLATE 20 MG IM SOLR: 20.0000 mg | INTRAMUSCULAR | Status: DC | PRN

## 2022-05-15 MED ORDER — THIAMINE MONONITRATE 100 MG PO TABS
100.0000 mg | ORAL_TABLET | Freq: Every day | ORAL | Status: DC
  Administered 2022-05-15: 100 mg via ORAL
  Filled 2022-05-15 (×2): qty 1

## 2022-05-15 MED ORDER — LORAZEPAM 2 MG/ML IJ SOLN: 0.0000 mg | Freq: Two times a day (BID) | INTRAMUSCULAR | Status: DC

## 2022-05-15 NOTE — ED Notes (Signed)
Pt upset and wanting to go home. Pt verbalized she would bite and hurt staff if they try to stop her and she has HIV. Security and [employer] deescalating pt.

## 2022-05-15 NOTE — Consult Note (Signed)
Telepsych Consultation   Reason for Consult: Psych consult Referring Physician: Dr. Rhunette Croft Location of Patient: [facility] Location of Provider: Behavioral Health TTS Department  Patient Identification: Lori [name]  MRN:  [id number]  Principal Diagnosis: Substance abuse mood disorder, bipolar disorder, schizophrenia, and depression.  Diagnosis: Homicidal ideations  Total Time spent with patient: 30 minutes  Subjective:   Lori [name] is a 51 y.o. female patient admitted with homicidal ideation.  Assessment: Patient was seen and examined via telepsych, laying on her bed at the [facility] ED.  Appears sleepy and states I just want this drugs to get out of my system.  Alert and oriented x 4.  Speech clear and coherent with normal volume and pattern.  Able to maintain fair eye contact with this provider.  Presents with anxious, depressed mood with blunted affect.  When asked what brought her to the hospital, patient reports I had this intense thought of hurting my parents without any rational, intent, or plans.  Reports the trigger could be from smoking 1 bowl of marijuana, snorting several 100s of dollars worth of crack cocaine for the past 2 days and drinking 1/5 of liquor.  Instruction provided on cessation of polysubstance usage, due to adverse effects on overall psychiatric and medical wellbeing.  Patient nodded in agreement and added if I go back, I will continue doing the same thing except for crack cocaine.  Patient reports symptoms of depression to include hypersomnia, anxiety, fatigue, self-isolation, irritability, worthlessness, hopelessness, and anhedonia.  Encouraged to take her depression medication as ordered.  Reports anxiety of 6/10, with 10 being the worst.  Encouraged to take her PRN medication for anxiety as needed.  Lab reviewed with urine drug screen positive for cocaine and marijuana.  BAL less than 10.  EKG report indicates normal sinus rhythm, T wave  abnormality, consider anterolateral ischemia.  Abnormal EKG when compared with ECG of Nov 11, 2017.  Reports that her parents are good parents and there was no excuse for the thought of hurting them.  Currently, patient denies SI, HI, or AVH, however reports if I go home I may relapse into this bad thoughts of harming someone.  Denies psychotic symptoms.  Reports not taking her medication for a year or 2, however self medicate with marijuana and alcohol daily.  Reports not sleeping last night due to racing thoughts.  Reports not having access to firearms and being safe at home.  Denies self-injurious behavior however has occasional suicidal ideations.  Reports family history of mental illness, with brother diagnosed with bipolar disorder and bipolar disorder being prevalence on the maternal side of her family.  Reports being admitted to behavioral health Hospital in [place] 3 years ago for psychosis, information not seen in the chart.  HPI: Lori [name] is a 51 y.o. female who comes in with chief complaint of psychiatric evaluation.  Patient has history of bipolar disorder, schizophrenia and depression.  She also admits to substance use disorder.  She has not been taking her medications.  This morning patient decided to come to the ER, because she started having homicidal ideation towards her family member and also she had thoughts of killing her pets.  She admits to crack use yesterday along with oxycodone and alcohol. Questionable HIV history.  Patient is not taking her HIV medications or psychiatry medications.  While getting assessed, patient expressed the nursing staff that she just wanted to leave.  IVC paperwork to be completed.  Disposition: Based on  my examination of the patient she appears to be at imminent risk to herself and others.  She meets the criteria for inpatient psychiatric admission and is recommended for admission to adult psychiatric unit at [facility] pending negative COVID test.   Gonzella Lex at [phone] at [facility] health [facility] security department made aware of patient's homicidal ideations without plans or intent.  Arlys John advised that parents are probably safe since patient do not have access to firearms.  Audelia Acton, patient's mom at [phone] called and made aware of patient's prior homicidal ideation towards them.  Mom said thank you very much, but more concerned about patient's wellbeing.   Past Psychiatric History: History of suicidal ideation, history of IVC, alcohol abuse with alcohol-induced mood disorder, anxiety disorder generalized, cocaine abuse, tobacco abuse.  Risk to Self: No  Risk to Others:  Yes Prior Inpatient Therapy:  Yes Prior Outpatient Therapy: None indicated   Past Medical History:  Past Medical History:  Diagnosis Date   Abnormal Pap smear    Had Cryo to treat   Allergy    Anemia    Anxiety    Cluster headaches    HIV infection (HCC)    Lung nodule    Vaginal venereal warts     Past Surgical History:  Procedure Laterality Date   ABDOMINAL HYSTERECTOMY     KNEE ARTHROSCOPY Left    bakers cyst removal   TUBAL LIGATION     Family History:  Family History  Problem Relation Age of Onset   CAD Mother        MI 2016   Uterine cancer Mother    Cervical cancer Mother    Colon cancer Mother    Lung cancer Maternal Grandmother    Cancer Maternal Grandfather        larynx   Prostate cancer Maternal Grandfather    Prostate cancer Father    Breast cancer Neg Hx    Liver disease Neg Hx    Esophageal cancer Neg Hx    Stomach cancer Neg Hx    Colon polyps Neg Hx    Family Psychiatric  History: Patient reports brother diagnosed with  bipolar.  Added that bipolar is heavy and maternal side of her family.  Social History:  Social History   Substance and Sexual Activity  Alcohol Use Yes   Alcohol/week: 0.0 standard drinks of alcohol   Comment: "a lot every day"      Social History   Substance and Sexual Activity   Drug Use Yes   Frequency: 7.0 times per week   Types: Marijuana, Cocaine   Comment: not currently doing cocaine= Marijuana    Social History   Socioeconomic History   Marital status: Single    Spouse name: Not on file   Number of children: Not on file   Years of education: Not on file   Highest education level: Not on file  Occupational History   Not on file  Tobacco Use   Smoking status: Every Day    Packs/day: 1.00    Years: 27.00    Total pack years: 27.00    Types: Cigarettes   Smokeless tobacco: Never   Tobacco comments:    patient not ready to quit; considering patches  Vaping Use   Vaping Use: Never used  Substance and Sexual Activity   Alcohol use: Yes    Alcohol/week: 0.0 standard drinks of alcohol    Comment: "a lot every day"  Drug use: Yes    Frequency: 7.0 times per week    Types: Marijuana, Cocaine    Comment: not currently doing cocaine= Marijuana   Sexual activity: Yes    Birth control/protection: Surgical, Condom    Comment: pt. declined condoms  Other Topics Concern   Not on file  Social History Narrative   Not on file   Social Determinants of Health   Financial Resource Strain: Low Risk  (02/04/2020)   Overall Financial Resource Strain (CARDIA)    Difficulty of Paying Living Expenses: Not very hard  Food Insecurity: No Food Insecurity (02/04/2020)   Hunger Vital Sign    Worried About Running Out of Food in the Last Year: Never true    Ran Out of Food in the Last Year: Never true  Transportation Needs: Unmet Transportation Needs (02/04/2020)   PRAPARE - Transportation    Lack of Transportation (Medical): Yes    Lack of Transportation (Non-Medical): Yes  Physical Activity: Inactive (02/04/2020)   Exercise Vital Sign    Days of Exercise per Week: 0 days    Minutes of Exercise per Session: 0 min  Stress: Stress Concern Present (02/04/2020)   [employer] of Occupational Health - Occupational Stress Questionnaire    Feeling of Stress :  Very much  Social Connections: Moderately Isolated (02/04/2020)   Social Connection and Isolation Panel [NHANES]    Frequency of Communication with Friends and Family: More than three times a week    Frequency of Social Gatherings with Friends and Family: Never    Attends Religious Services: 1 to 4 times per year    Active Member of [employer] or Organizations: No    Attends Banker Meetings: Never    Marital Status: Never married   Additional Social History:   Allergies:   Allergies  Allergen Reactions   Doxycycline Swelling   Other Swelling    Mulberry fruit    Sulfonamide Derivatives Other (See Comments)    Childhood allergy.   Labs:  Results for orders placed or performed during the hospital encounter of 05/15/22 (from the past 48 hour(s))  Urine rapid drug screen (hosp performed)     Status: Abnormal   Collection Time: 05/15/22  8:26 AM  Result Value Ref Range   Opiates NONE DETECTED NONE DETECTED   Cocaine POSITIVE (A) NONE DETECTED   Benzodiazepines NONE DETECTED NONE DETECTED   Amphetamines NONE DETECTED NONE DETECTED   Tetrahydrocannabinol POSITIVE (A) NONE DETECTED   Barbiturates NONE DETECTED NONE DETECTED    Comment: (NOTE) DRUG SCREEN FOR MEDICAL PURPOSES ONLY.  IF CONFIRMATION IS NEEDED FOR ANY PURPOSE, NOTIFY LAB WITHIN 5 DAYS.  LOWEST DETECTABLE LIMITS FOR URINE DRUG SCREEN Drug Class                     Cutoff (ng/mL) Amphetamine and metabolites    1000 Barbiturate and metabolites    200 Benzodiazepine                 200 Opiates and metabolites        300 Cocaine and metabolites        300 THC                            50 Performed at [facility], [address] [place], [place] [address]   Comprehensive metabolic panel     Status: Abnormal   Collection Time: 05/15/22  8:45 AM  Result Value Ref Range   Sodium 138 135 - 145 mmol/L   Potassium 3.4 (L) 3.5 - 5.1 mmol/L   Chloride 103 98 - 111 mmol/L   CO2 24 22 - 32 mmol/L   Glucose, Bld  104 (H) 70 - 99 mg/dL    Comment: Glucose reference range applies only to samples taken after fasting for at least 8 hours.   BUN 16 6 - 20 mg/dL   Creatinine, Ser 5.09 0.44 - 1.00 mg/dL   Calcium 9.5 8.9 - 32.6 mg/dL   Total Protein 7.7 6.5 - 8.1 g/dL   Albumin 4.8 3.5 - 5.0 g/dL   AST 53 (H) 15 - 41 U/L   ALT 29 0 - 44 U/L   Alkaline Phosphatase 63 38 - 126 U/L   Total Bilirubin 1.0 0.3 - 1.2 mg/dL   GFR, Estimated >71 >24 mL/min    Comment: (NOTE) Calculated using the CKD-EPI Creatinine Equation (2021)    Anion gap 11 5 - 15    Comment: Performed at [facility], [address], [place], [place] [address]  CBC with Diff     Status: None   Collection Time: 05/15/22  8:45 AM  Result Value Ref Range   WBC 8.1 4.0 - 10.5 K/uL   RBC 4.33 3.87 - 5.11 MIL/uL   Hemoglobin 14.2 12.0 - 15.0 g/dL   HCT 83.3 82.5 - 05.3 %   MCV 94.5 80.0 - 100.0 fL   MCH 32.8 26.0 - 34.0 pg   MCHC 34.7 30.0 - 36.0 g/dL   RDW 97.6 73.4 - 19.3 %   Platelets 261 150 - 400 K/uL   nRBC 0.0 0.0 - 0.2 %   Neutrophils Relative % 69 %   Neutro Abs 5.6 1.7 - 7.7 K/uL   Lymphocytes Relative 22 %   Lymphs Abs 1.8 0.7 - 4.0 K/uL   Monocytes Relative 7 %   Monocytes Absolute 0.6 0.1 - 1.0 K/uL   Eosinophils Relative 1 %   Eosinophils Absolute 0.1 0.0 - 0.5 K/uL   Basophils Relative 1 %   Basophils Absolute 0.1 0.0 - 0.1 K/uL   Immature Granulocytes 0 %   Abs Immature Granulocytes 0.02 0.00 - 0.07 K/uL    Comment: Performed at [facility], [address], [place], [place] [address]  Ethanol     Status: None   Collection Time: 05/15/22  8:45 AM  Result Value Ref Range   Alcohol, Ethyl (B) <10 <10 mg/dL    Comment: (NOTE) Lowest detectable limit for serum alcohol is 10 mg/dL.  For medical purposes only. Performed at [facility], [address] [place], [place] [address]   POC urine preg, ED     Status: None   Collection Time: 05/15/22  8:57 AM  Result Value Ref Range   Preg Test, Ur NEGATIVE NEGATIVE     Comment:        THE SENSITIVITY OF THIS METHODOLOGY IS >24 mIU/mL     Medications:  Current Facility-Administered Medications  Medication Dose Route Frequency Provider Last Rate Last Admin   acetaminophen (TYLENOL) tablet 650 mg  650 mg Oral Q4H PRN Rhunette Croft, Ankit, MD   650 mg at 05/15/22 1158   bictegravir-emtricitabine-tenofovir AF (BIKTARVY) 50-200-25 MG per tablet 1 tablet  1 tablet Oral Daily Derwood Kaplan, MD   1 tablet at 05/15/22 1147   citalopram (CELEXA) tablet 10 mg  10 mg Oral Daily Derwood Kaplan, MD   10 mg at 05/15/22 1147  fluticasone (FLONASE) 50 MCG/ACT nasal spray 2 spray  2 spray Each Nare Daily Derwood Kaplan, MD   2 spray at 05/15/22 1154   lisinopril (ZESTRIL) tablet 5 mg  5 mg Oral Daily Rhunette Croft, Ankit, MD   5 mg at 05/15/22 1156   LORazepam (ATIVAN) injection 0-4 mg  0-4 mg Intravenous Q6H Nanavati, Ankit, MD       Or   LORazepam (ATIVAN) tablet 0-4 mg  0-4 mg Oral Q6H Nanavati, Ankit, MD   1 mg at 05/15/22 1147   [START ON 05/17/2022] LORazepam (ATIVAN) injection 0-4 mg  0-4 mg Intravenous Q12H Derwood Kaplan, MD       Or   [name] ON 05/17/2022] LORazepam (ATIVAN) tablet 0-4 mg  0-4 mg Oral Q12H Nanavati, Ankit, MD       risperiDONE (RISPERDAL M-TABS) disintegrating tablet 2 mg  2 mg Oral Q8H PRN Derwood Kaplan, MD   2 mg at 05/15/22 1147   And   LORazepam (ATIVAN) tablet 1 mg  1 mg Oral PRN Derwood Kaplan, MD       And   ziprasidone (GEODON) injection 20 mg  20 mg Intramuscular PRN Rhunette Croft, Ankit, MD       nicotine (NICODERM CQ - dosed in mg/24 hours) patch 21 mg  21 mg Transdermal Daily Rhunette Croft, Ankit, MD   21 mg at 05/15/22 1147   ondansetron (ZOFRAN) tablet 4 mg  4 mg Oral Q8H PRN Rhunette Croft, Ankit, MD       thiamine (VITAMIN B1) tablet 100 mg  100 mg Oral Daily Rhunette Croft, Ankit, MD   100 mg at 05/15/22 1147   Or   thiamine (VITAMIN B1) injection 100 mg  100 mg Intravenous Daily Nanavati, Ankit, MD       zolpidem (AMBIEN) tablet 5 mg  5 mg Oral QHS  PRN Derwood Kaplan, MD       Current Outpatient Medications  Medication Sig Dispense Refill   acetaminophen (TYLENOL) 325 MG tablet Take 650 mg by mouth every 6 (six) hours as needed.     bictegravir-emtricitabine-tenofovir AF (BIKTARVY) 50-200-25 MG TABS tablet TAKE 1 TABLET BY MOUTH DAILY. 30 tablet 1   fluticasone (FLONASE) 50 MCG/ACT nasal spray Place 2 sprays into both nostrils daily. 15 g 2   ibuprofen (ADVIL) 200 MG tablet Take 600 mg by mouth every 6 (six) hours as needed for moderate pain.     lisinopril (ZESTRIL) 5 MG tablet Take 1 tablet (5 mg total) by mouth daily. 90 tablet 1   citalopram (CELEXA) 10 MG tablet Take 1 tablet (10 mg total) by mouth daily. (Patient not taking: Reported on 05/15/2022) 30 tablet 1    Musculoskeletal: Strength & Muscle Tone: within normal limits Gait & Station: normal Patient leans: N/A  Psychiatric Specialty Exam:  Presentation  General Appearance:  Appropriate for Environment; Casual  Eye Contact: Fair  Speech: Clear and Coherent; Normal Rate  Speech Volume: Normal  Handedness: Right  Mood and Affect  Mood: Anxious; Depressed  Affect: Blunt  Thought Process  Thought Processes: Coherent; Linear  Descriptions of Associations:Intact  Orientation:Full (Time, Place and Person)  Thought Content:Logical  History of Schizophrenia/Schizoaffective disorder:No data recorded Duration of Psychotic Symptoms:No data recorded Hallucinations:Hallucinations: None  Ideas of Reference:None  Suicidal Thoughts:Suicidal Thoughts: No  Homicidal Thoughts:Homicidal Thoughts: Yes, Passive (Without any plans) HI Passive Intent and/or Plan: Without Intent; Without Plan  Sensorium  Memory: Immediate Fair; Recent Fair  Judgment: Poor  Insight: Shallow  Executive Functions  Concentration: Fair  Attention  Span: Fair  Recall: Fis[employer]  Fund of Knowledge: Fair  Language: Good  Psychomotor Activity  Psychomotor  Activity: Psychomotor Activity: Normal  Assets  Assets: Communication Skills; Desire for Improvement; Physical Health  Sleep  Sleep: Sleep: Poor Number of Hours of Sleep: 0  Physical Exam: Physical Exam Vitals and nursing note reviewed.   Review of Systems  Constitutional: Negative.        Patient has history of HIV infection and taking Biktarvy.  HENT: Negative.    Eyes: Negative.   Respiratory:  Positive for cough.   Cardiovascular: Negative.        Blood pressure 140/104, pulse 90.  Nursing staff to recheck vital signs.  Gastrointestinal: Negative.   Genitourinary: Negative.   Musculoskeletal: Negative.   Skin: Negative.   Neurological: Negative.   Endo/Heme/Allergies: Negative.                Reaction Severity Reaction Type Noted       Allergies    Doxycycline  Swelling Not Specified  06/24/2014    Other  Swelling Not Specified  11/01/2018 Mulberry fruit    Sulfonamide Derivatives  Other (See Comments) Not Specified   Childhood allergy.      Psychiatric/Behavioral:  Positive for depression and substance abuse. The patient is nervous/anxious and has insomnia.    Blood pressure (!) 140/104, pulse 90, temperature 99 F (37.2 C), temperature source Oral, resp. rate 20, height 5\' 4"  (1.626 m), weight 95.3 kg, SpO2 99 %. Body mass index is 36.05 kg/m.  Treatment Plan Summary: Daily contact with patient to assess and evaluate symptoms and progress in treatment and Medication management  Plan:  Substance abuse mood disorder: -- Continue citalopram (Celexa) tablet 10 mg p.o. daily  Anxiety: -- Hydroxyzine 25 mg p.o. 3 times daily as needed for anxiety.  Insomnia: -- Trazodone 50 mg p.o. nightly.   --DC Ambien 5 mg p.o. nightly  Agitation protocol: -- Continue on risperidone disintegrating tabs 2 mg p.o. every 4 hours as needed.  And -- Continue on lorazepam Ativan tablet 1 mg p.o. as needed for severe agitation for 1 dose only.  And -- Ziprasidone  Geodon injection 20 mg IM as needed agitation, for 1 dose only.  And -- Thiamine vitamin B1 tablet 100 mg p.o. daily. --Ondansetron Zofran tablet 4 mg p.o. every 8 hours as needed for nausea or vomiting. Vital signs every 6 hours x 48 hours then per unit protocol. Involuntary commitment with one-to-one continuous monitoring  Alcohol detox protocol:: --Continue on lorazepam (Ativan) protocol as ordered -- Monitor CIWA as ordered  HIV infection: -- Continue on Bictegravir-emtriccitabine-tenofovir AF (Biktarvy) 50-200-25  mg p.o. tablet 1 p.o. daily  HTN: -- Continue lisinopril Zestril tablets 5 mg p.o. daily  Disposition: Recommend psychiatric Inpatient admission when medically cleared.  This service was provided via telemedicine using a 2-way, interactive audio and video technology.  Names of all persons participating in this telemedicine service and their role in this encounter. Name: Danice Goltzhristina Landini Role: Patient  Name: Alan Mulderina Lauralynn Loeb Role: NP provider  Name: Dr. Lucianne MussKumar Role: Medical Director  Name: Dr. Rhunette CroftNanavati Role: [facility] ED physician    Cecilie Lowersina C Nariyah Osias, FNP 05/15/2022 4:14 PM

## 2022-05-15 NOTE — ED Notes (Signed)
Wallet and phone with security.

## 2022-05-15 NOTE — ED Triage Notes (Signed)
"  History of bipolar, schizhophrnia, depression and anxiety. Currently not taking any medications for this. Yesterday she says she smoked crack, took oxycodone, drank alchohol. She is HIV positive and is on medication for that. She says that this morning she is thinking of killing her family that live in the houses around her. Stated she would kill 2 of the cats and let one live. Has been cooperative so far. No physical complaints noted" per EMS Denies suicidal thoughts. States she has thoughts of killing her brother all of the time but never thoughts of killing her parents so this really alarmed her to call for help. Patient states she has no specific plan of how she would kill them.

## 2022-05-15 NOTE — ED Provider Notes (Signed)
[facility] - Inpatient EMERGENCY DEPARTMENT Provider Note   CSN: [id number] Arrival date & time: 05/15/22  8315     History  Chief Complaint  Patient presents with   Psychiatric Evaluation    Lori [name] is a 51 y.o. female.  HPI     51 year old female comes in with chief complaint of psychiatric evaluation.  Patient has history of bipolar disorder, schizophrenia and depression.  She also admits to substance use disorder.  She has not been taking her medications.  This morning patient decided to come to the ER, because she started having homicidal ideation towards her family member and also she had thoughts of killing her pets.  She admits to crack use yesterday along with oxycodone and alcohol. Questionable HIV history.  Patient is not taking her HIV medications or psychiatry medications.  While getting assessed, patient expressed the nursing staff that she just wanted to leave.  IVC paperwork to be completed.  Home Medications Prior to Admission medications   Medication Sig Start Date End Date Taking? Authorizing Provider  acetaminophen (TYLENOL) 325 MG tablet Take 650 mg by mouth every 6 (six) hours as needed.    [provider]  albuterol (VENTOLIN HFA) 108 (90 Base) MCG/ACT inhaler Inhale 2 puffs into the lungs every 6 (six) hours as needed for wheezing or shortness of breath. 05/25/21   Particia Nearing, PA-C  bictegravir-emtricitabine-tenofovir AF (BIKTARVY) 50-200-25 MG TABS tablet TAKE 1 TABLET BY MOUTH DAILY. 03/26/22   [name], MD  citalopram (CELEXA) 10 MG tablet Take 1 tablet (10 mg total) by mouth daily. 02/09/21   [name], MD  fluticasone (FLONASE) 50 MCG/ACT nasal spray Place 2 sprays into both nostrils daily. 10/30/20   [name], MD  lisinopril (ZESTRIL) 5 MG tablet Take 1 tablet (5 mg total) by mouth daily. 03/09/22 03/09/23  Tyson Alias, MD  diphenhydramine-acetaminophen (TYLENOL PM) 25-500 MG TABS tablet  Take 1 tablet by mouth at bedtime as needed (sleep).  08/23/19  [provider]      Allergies    Doxycycline, Other, and Sulfonamide derivatives    Review of Systems   Review of Systems  Physical Exam Updated Vital Signs BP (!) 140/104 (BP Location: Right Arm)   Pulse 90   Temp 99 F (37.2 C) (Oral)   Resp 20   Ht 5\' 4"  (1.626 m)   Wt 95.3 kg   SpO2 99%   BMI 36.05 kg/m  Physical Exam Constitutional:      Appearance: She is well-developed.  HENT:     Head: Normocephalic and atraumatic.  Eyes:     Extraocular Movements: Extraocular movements intact.  Cardiovascular:     Rate and Rhythm: Normal rate.     Heart sounds: Normal heart sounds.  Pulmonary:     Effort: Pulmonary effort is normal. No respiratory distress.  Abdominal:     General: There is no distension.     Palpations: Abdomen is soft.     Tenderness: There is no abdominal tenderness.  Musculoskeletal:     Cervical back: Neck supple.  Skin:    General: Skin is warm and dry.  Neurological:     Mental Status: She is alert and oriented to person, place, and time.  Psychiatric:     Comments: Agitated     ED Results / Procedures / Treatments   Labs (all labs ordered are listed, but only abnormal results are displayed) Labs Reviewed  COMPREHENSIVE METABOLIC PANEL - Abnormal; Notable  for the following components:      Result Value   Potassium 3.4 (*)    Glucose, Bld 104 (*)    AST 53 (*)    All other components within normal limits  RAPID URINE DRUG SCREEN, HOSP PERFORMED - Abnormal; Notable for the following components:   Cocaine POSITIVE (*)    Tetrahydrocannabinol POSITIVE (*)    All other components within normal limits  SARS CORONAVIRUS 2 BY RT PCR  CBC WITH DIFFERENTIAL/PLATELET  ETHANOL  POC URINE PREG, ED    EKG None  Radiology No results found.  Procedures Procedures    Medications Ordered in ED Medications  LORazepam (ATIVAN) injection 0-4 mg (has no administration in  time range)    Or  LORazepam (ATIVAN) tablet 0-4 mg (has no administration in time range)  LORazepam (ATIVAN) injection 0-4 mg (has no administration in time range)    Or  LORazepam (ATIVAN) tablet 0-4 mg (has no administration in time range)  thiamine (VITAMIN B1) tablet 100 mg (has no administration in time range)    Or  thiamine (VITAMIN B1) injection 100 mg (has no administration in time range)  risperiDONE (RISPERDAL M-TABS) disintegrating tablet 2 mg (has no administration in time range)    And  LORazepam (ATIVAN) tablet 1 mg (has no administration in time range)    And  ziprasidone (GEODON) injection 20 mg (has no administration in time range)  acetaminophen (TYLENOL) tablet 650 mg (has no administration in time range)  zolpidem (AMBIEN) tablet 5 mg (has no administration in time range)  ondansetron (ZOFRAN) tablet 4 mg (has no administration in time range)  nicotine (NICODERM CQ - dosed in mg/24 hours) patch 21 mg (has no administration in time range)  bictegravir-emtricitabine-tenofovir AF (BIKTARVY) 50-200-25 MG per tablet 1 tablet (has no administration in time range)  citalopram (CELEXA) tablet 10 mg (has no administration in time range)  lisinopril (ZESTRIL) tablet 5 mg (has no administration in time range)  fluticasone (FLONASE) 50 MCG/ACT nasal spray 2 spray (has no administration in time range)    ED Course/ Medical Decision Making/ A&P Clinical Course as of 05/15/22 1104  Sat May 15, 2022  1103 All the labs are reassuring.  Patient is medically cleared for psychiatric evaluation. [AN]    Clinical Course User Index [AN] Derwood Kaplan, MD                           Medical Decision Making 51 year old female comes in with chief complaint of suicidal and homicidal ideation.  She has expressed intent to harm her family, that lives near her.  She also admits to polysubstance use and medication noncompliance.  Patient demonstrating poor judgment, poor thought  content. She has expressed to [place] that she would like to leave the ER.  IVC paperwork completed.  She is medically cleared for psychiatric evaluation.  Amount and/or Complexity of Data Reviewed Labs: ordered.     Final Clinical Impression(s) / ED Diagnoses Final diagnoses:  Suicidal ideation  Polysubstance abuse (HCC)  Homicidal ideation    Rx / DC Orders ED Discharge Orders     None         Derwood Kaplan, MD 05/15/22 1104

## 2022-05-15 NOTE — ED Notes (Signed)
Pt's blue shirt, gray pants, pink crocs and black hairbrush locked up in [employer]. Pt's cell phone and wallet given to security to lock up. Security also called to come wand pt.

## 2022-05-15 NOTE — ED Notes (Signed)
Pt given [place] frozen dinner and soda

## 2022-05-15 NOTE — ED Notes (Signed)
Pt requesting the phone to leave. Edp aware.

## 2022-05-15 NOTE — ED Notes (Signed)
Faxed IVC papers to [facility] fax # [phone].

## 2022-05-15 NOTE — ED Notes (Signed)
Pt has been wanded by security. 

## 2022-05-15 NOTE — ED Notes (Signed)
Pt voiced she had thoughts of killing her parents earlier and sometimes her brother but right now at this time she does not have those thoughts.

## 2022-05-16 ENCOUNTER — Inpatient Hospital Stay (HOSPITAL_COMMUNITY)
Admission: AD | Admit: 2022-05-16 | Discharge: 2022-05-19 | DRG: 885 | Disposition: A | Discharge status: Home / Self Care | Payer: Medicare Other | Source: Ambulatory Visit | Attending: Psychiatry | Admitting: Psychiatry

## 2022-05-16 ENCOUNTER — Encounter (HOSPITAL_COMMUNITY): Payer: Self-pay | Admitting: Behavioral Health

## 2022-05-16 DIAGNOSIS — F1410 Cocaine abuse, uncomplicated: Secondary | ICD-10-CM | POA: Diagnosis present

## 2022-05-16 DIAGNOSIS — F4312 Post-traumatic stress disorder, chronic: Secondary | ICD-10-CM

## 2022-05-16 DIAGNOSIS — Z90710 Acquired absence of both cervix and uterus: Secondary | ICD-10-CM | POA: Diagnosis not present

## 2022-05-16 DIAGNOSIS — F411 Generalized anxiety disorder: Secondary | ICD-10-CM | POA: Diagnosis present

## 2022-05-16 DIAGNOSIS — F314 Bipolar disorder, current episode depressed, severe, without psychotic features: Secondary | ICD-10-CM | POA: Diagnosis not present

## 2022-05-16 DIAGNOSIS — R45851 Suicidal ideations: Secondary | ICD-10-CM | POA: Diagnosis present

## 2022-05-16 DIAGNOSIS — Z8249 Family history of ischemic heart disease and other diseases of the circulatory system: Secondary | ICD-10-CM

## 2022-05-16 DIAGNOSIS — F1110 Opioid abuse, uncomplicated: Secondary | ICD-10-CM

## 2022-05-16 DIAGNOSIS — F17210 Nicotine dependence, cigarettes, uncomplicated: Secondary | ICD-10-CM | POA: Diagnosis present

## 2022-05-16 DIAGNOSIS — F1010 Alcohol abuse, uncomplicated: Secondary | ICD-10-CM | POA: Diagnosis present

## 2022-05-16 DIAGNOSIS — R45850 Homicidal ideations: Secondary | ICD-10-CM | POA: Diagnosis present

## 2022-05-16 DIAGNOSIS — I10 Essential (primary) hypertension: Secondary | ICD-10-CM | POA: Diagnosis present

## 2022-05-16 DIAGNOSIS — Z560 Unemployment, unspecified: Secondary | ICD-10-CM | POA: Diagnosis not present

## 2022-05-16 DIAGNOSIS — F1210 Cannabis abuse, uncomplicated: Secondary | ICD-10-CM | POA: Diagnosis present

## 2022-05-16 DIAGNOSIS — Z79899 Other long term (current) drug therapy: Secondary | ICD-10-CM

## 2022-05-16 DIAGNOSIS — Z818 Family history of other mental and behavioral disorders: Secondary | ICD-10-CM

## 2022-05-16 DIAGNOSIS — Z800 Family history of malignant neoplasm of digestive organs: Secondary | ICD-10-CM

## 2022-05-16 DIAGNOSIS — Z8049 Family history of malignant neoplasm of other genital organs: Secondary | ICD-10-CM | POA: Diagnosis not present

## 2022-05-16 DIAGNOSIS — Z823 Family history of stroke: Secondary | ICD-10-CM

## 2022-05-16 DIAGNOSIS — Z5982 Transportation insecurity: Secondary | ICD-10-CM | POA: Diagnosis not present

## 2022-05-16 DIAGNOSIS — B20 Human immunodeficiency virus [HIV] disease: Secondary | ICD-10-CM | POA: Diagnosis present

## 2022-05-16 DIAGNOSIS — F1290 Cannabis use, unspecified, uncomplicated: Secondary | ICD-10-CM

## 2022-05-16 DIAGNOSIS — G4700 Insomnia, unspecified: Secondary | ICD-10-CM | POA: Diagnosis present

## 2022-05-16 DIAGNOSIS — Z91148 Patient's other noncompliance with medication regimen for other reason: Secondary | ICD-10-CM

## 2022-05-16 DIAGNOSIS — Z801 Family history of malignant neoplasm of trachea, bronchus and lung: Secondary | ICD-10-CM | POA: Diagnosis not present

## 2022-05-16 DIAGNOSIS — K5900 Constipation, unspecified: Secondary | ICD-10-CM | POA: Diagnosis present

## 2022-05-16 DIAGNOSIS — Z20822 Contact with and (suspected) exposure to covid-19: Secondary | ICD-10-CM | POA: Diagnosis present

## 2022-05-16 DIAGNOSIS — F17200 Nicotine dependence, unspecified, uncomplicated: Secondary | ICD-10-CM | POA: Diagnosis present

## 2022-05-16 DIAGNOSIS — F319 Bipolar disorder, unspecified: Principal | ICD-10-CM | POA: Diagnosis present

## 2022-05-16 MED ORDER — CLONIDINE HCL 0.1 MG PO TABS
0.1000 mg | ORAL_TABLET | Freq: Every day | ORAL | Status: DC
  Administered 2022-05-16 – 2022-05-18 (×3): 0.1 mg via ORAL
  Filled 2022-05-16 (×5): qty 1

## 2022-05-16 MED ORDER — BENZTROPINE MESYLATE 1 MG PO TABS: 1.0000 mg | ORAL_TABLET | Freq: Four times a day (QID) | ORAL | Status: DC | PRN

## 2022-05-16 MED ORDER — ALUM & MAG HYDROXIDE-SIMETH 200-200-20 MG/5ML PO SUSP: 30.0000 mL | ORAL | Status: DC | PRN

## 2022-05-16 MED ORDER — ACETAMINOPHEN 325 MG PO TABS
650.0000 mg | ORAL_TABLET | Freq: Four times a day (QID) | ORAL | Status: DC | PRN
  Administered 2022-05-16 – 2022-05-18 (×3): 650 mg via ORAL
  Filled 2022-05-16 (×3): qty 2

## 2022-05-16 MED ORDER — IBUPROFEN 600 MG PO TABS
ORAL_TABLET | ORAL | Status: AC
  Filled 2022-05-16: qty 1

## 2022-05-16 MED ORDER — LORAZEPAM 1 MG PO TABS: 2.0000 mg | ORAL_TABLET | Freq: Four times a day (QID) | ORAL | Status: DC | PRN

## 2022-05-16 MED ORDER — HYDROXYZINE HCL 25 MG PO TABS
25.0000 mg | ORAL_TABLET | Freq: Three times a day (TID) | ORAL | Status: DC | PRN
  Administered 2022-05-16 – 2022-05-19 (×6): 25 mg via ORAL
  Filled 2022-05-16 (×5): qty 1

## 2022-05-16 MED ORDER — LOPERAMIDE HCL 2 MG PO CAPS: 2.0000 mg | ORAL_CAPSULE | ORAL | Status: DC | PRN

## 2022-05-16 MED ORDER — NICOTINE 14 MG/24HR TD PT24
MEDICATED_PATCH | TRANSDERMAL | Status: AC
  Filled 2022-05-16: qty 1

## 2022-05-16 MED ORDER — TRAZODONE HCL 50 MG PO TABS
50.0000 mg | ORAL_TABLET | Freq: Every evening | ORAL | Status: DC | PRN
  Administered 2022-05-16 – 2022-05-18 (×3): 50 mg via ORAL
  Filled 2022-05-16 (×3): qty 1

## 2022-05-16 MED ORDER — BENZTROPINE MESYLATE 1 MG/ML IJ SOLN: 1.0000 mg | Freq: Four times a day (QID) | INTRAMUSCULAR | Status: DC | PRN

## 2022-05-16 MED ORDER — MAGNESIUM HYDROXIDE 400 MG/5ML PO SUSP: 30.0000 mL | Freq: Every day | ORAL | Status: DC | PRN

## 2022-05-16 MED ORDER — BICTEGRAVIR-EMTRICITAB-TENOFOV 50-200-25 MG PO TABS
1.0000 | ORAL_TABLET | Freq: Every day | ORAL | Status: DC
  Administered 2022-05-16 – 2022-05-19 (×4): 1 via ORAL
  Filled 2022-05-16 (×6): qty 1

## 2022-05-16 MED ORDER — HALOPERIDOL 5 MG PO TABS: 5.0000 mg | ORAL_TABLET | Freq: Four times a day (QID) | ORAL | Status: DC | PRN

## 2022-05-16 MED ORDER — THIAMINE HCL 100 MG/ML IJ SOLN
100.0000 mg | Freq: Once | INTRAMUSCULAR | Status: AC
  Administered 2022-05-16: 100 mg via INTRAMUSCULAR
  Filled 2022-05-16: qty 2

## 2022-05-16 MED ORDER — ADULT MULTIVITAMIN W/MINERALS CH
1.0000 | ORAL_TABLET | Freq: Every day | ORAL | Status: DC
  Administered 2022-05-16 – 2022-05-19 (×4): 1 via ORAL
  Filled 2022-05-16 (×7): qty 1

## 2022-05-16 MED ORDER — LORAZEPAM 2 MG/ML IJ SOLN: 2.0000 mg | Freq: Four times a day (QID) | INTRAMUSCULAR | Status: DC | PRN

## 2022-05-16 MED ORDER — NICOTINE 14 MG/24HR TD PT24
14.0000 mg | MEDICATED_PATCH | Freq: Every day | TRANSDERMAL | Status: DC
  Administered 2022-05-16 – 2022-05-17 (×2): 14 mg via TRANSDERMAL
  Filled 2022-05-16 (×3): qty 1

## 2022-05-16 MED ORDER — ARIPIPRAZOLE 5 MG PO TABS
5.0000 mg | ORAL_TABLET | Freq: Every day | ORAL | Status: DC
  Administered 2022-05-16 – 2022-05-17 (×2): 5 mg via ORAL
  Filled 2022-05-16 (×5): qty 1

## 2022-05-16 MED ORDER — CHLORDIAZEPOXIDE HCL 25 MG PO CAPS: 25.0000 mg | ORAL_CAPSULE | Freq: Four times a day (QID) | ORAL | Status: DC | PRN

## 2022-05-16 MED ORDER — IBUPROFEN 600 MG PO TABS
600.0000 mg | ORAL_TABLET | Freq: Three times a day (TID) | ORAL | Status: DC | PRN
  Administered 2022-05-16 – 2022-05-19 (×5): 600 mg via ORAL
  Filled 2022-05-16 (×4): qty 1

## 2022-05-16 MED ORDER — MUSCLE RUB 10-15 % EX CREA: TOPICAL_CREAM | CUTANEOUS | Status: DC | PRN

## 2022-05-16 MED ORDER — ONDANSETRON 4 MG PO TBDP: 4.0000 mg | ORAL_TABLET | Freq: Four times a day (QID) | ORAL | Status: DC | PRN

## 2022-05-16 MED ORDER — HYDROXYZINE HCL 25 MG PO TABS
25.0000 mg | ORAL_TABLET | Freq: Four times a day (QID) | ORAL | Status: DC | PRN
  Administered 2022-05-16: 25 mg via ORAL
  Filled 2022-05-16 (×2): qty 1

## 2022-05-16 MED ORDER — HALOPERIDOL LACTATE 5 MG/ML IJ SOLN: 5.0000 mg | Freq: Four times a day (QID) | INTRAMUSCULAR | Status: DC | PRN

## 2022-05-16 NOTE — ED Notes (Signed)
Pts 2 belonging bags given for [employer] officer to be taken with pt during transport.
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
## 2022-05-16 NOTE — ED Notes (Signed)
Call placed to [facility] to check on bed status, AC states they have been waiting on pt, advised we were not aware bed is ready and pt can be transported, advised pt will be going to [address], Bed 1 and report can be called to [phone]; [facility] further advised that earlier pt stated she was unwilling to go to [facility] and stated she is HIV positive and plans to bite staff, this nurse is asked to medicate pt so she is calm during transport and upon arrival due to prior statements, pt is sleeping at this time and will assess for needed medication
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
## 2022-05-16 NOTE — Progress Notes (Signed)
Adult Psychoeducational Group Note  Date:  05/16/2022 Time:  8:43 PM  Group Topic/Focus:  Wrap-Up Group:   The focus of this group is to help patients review their daily goal of treatment and discuss progress on daily workbooks.  Participation Level:  Active  Participation Quality:  Appropriate  Affect:  Appropriate  Cognitive:  Appropriate  Insight: Appropriate  Engagement in Group:  Engaged  Modes of Intervention:  Discussion  Additional Comments:   Pt states that she had a good day. Pt has been struggling with sleeping because because she has anxiety that keeps her awake. Pt has been more social and has been learning about her medication. Pt wants to work on developing better coping skills.   Vevelyn Pat 05/16/2022, 8:43 PM

## 2022-05-16 NOTE — Plan of Care (Signed)
  Problem: Education: Goal: Knowledge of [facility] Education information/materials will improve Outcome: Progressing Goal: Emotional status will improve Outcome: Progressing Goal: Mental status will improve Outcome: Progressing Goal: Verbalization of understanding the information provided will improve Outcome: Progressing   Problem: Activity: Goal: Interest or engagement in activities will improve Outcome: Progressing Goal: Sleeping patterns will improve Outcome: Progressing   Problem: Coping: Goal: Ability to verbalize frustrations and anger appropriately will improve Outcome: Progressing Goal: Ability to demonstrate self-control will improve Outcome: Progressing   Problem: Health Behavior/Discharge Planning: Goal: Identification of resources available to assist in meeting health care needs will improve Outcome: Progressing Goal: Compliance with treatment plan for underlying cause of condition will improve Outcome: Progressing   Problem: Physical Regulation: Goal: Ability to maintain clinical measurements within normal limits will improve Outcome: Progressing   Problem: Safety: Goal: Periods of time without injury will increase Outcome: Progressing   Problem: Education: Goal: Utilization of techniques to improve thought processes will improve Outcome: Progressing Goal: Knowledge of the prescribed therapeutic regimen will improve Outcome: Progressing   Problem: Activity: Goal: Interest or engagement in leisure activities will improve Outcome: Progressing Goal: Imbalance in normal sleep/wake cycle will improve Outcome: Progressing   Problem: Coping: Goal: Coping ability will improve Outcome: Progressing Goal: Will verbalize feelings Outcome: Progressing   Problem: Health Behavior/Discharge Planning: Goal: Ability to make decisions will improve Outcome: Progressing Goal: Compliance with therapeutic regimen will improve Outcome: Progressing    Problem: Safety: Goal: Ability to disclose and discuss suicidal ideas will improve Outcome: Progressing Goal: Ability to identify and utilize support systems that promote safety will improve Outcome: Progressing   Problem: Self-Concept: Goal: Will verbalize positive feelings about self Outcome: Progressing Goal: Level of anxiety will decrease Outcome: Progressing

## 2022-05-16 NOTE — [facility] Group Notes (Signed)
Adult Psychoeducational Group Note Date:  05/16/2022 Time:  0900-1000 Group Topic/Focus: PROGRESSIVE RELAXATION. A group where deep breathing is taught and tensing and relaxation muscle groups is used. Imagery is used as well.  Pts are asked to imagine 3 pillars that hold them up when they are not able to hold themselves up and to share that with the group.   Participation Level:  did not attend   : Desa Rech A  

## 2022-05-16 NOTE — Progress Notes (Signed)
   05/16/22 1600  Psych Admission Type (Psych Patients Only)  Admission Status Voluntary  Psychosocial Assessment  Patient Complaints Anxiety  Eye Contact Fair  Facial Expression Flat  Affect Anxious  Speech Logical/coherent  Interaction Assertive  Motor Activity Other (Comment) (WNL)  Appearance/Hygiene Unremarkable  Behavior Characteristics Cooperative;Appropriate to situation  Mood Pleasant;Anxious  Aggressive Behavior  Effect No apparent injury  Thought Process  Coherency WDL  Content WDL  Delusions None reported or observed  Perception WDL  Hallucination None reported or observed  Judgment WDL  Confusion WDL  Danger to Self  Current suicidal ideation? Denies  Danger to Others  Danger to Others None reported or observed

## 2022-05-16 NOTE — ED Notes (Signed)
Report called to nurse, Erskine Squibb, at [facility]

## 2022-05-16 NOTE — ED Notes (Signed)
Pt off unit at this time for transport to [facility], pt calm and cooperative, steady gait noted, no assistance needed

## 2022-05-16 NOTE — Progress Notes (Signed)
Lori [name] is a 51 y.o. female involuntarily admitted for homicide towards her parents. Pt admission not completed at this time and requested to be completed later Basic search of patient completed with skin check completed, bruises not to both legs and arms. Belongings reviewed and noted on belongings record. Oriented to unit and rules, consents and treatment agreement reviewed with patient and signed. Pt is in bet a sleep, will continue to monitor.

## 2022-05-16 NOTE — [facility] Suicide Risk Assessment (Signed)
[facility] INPATIENT:  Family/Significant Other Suicide Prevention Education  Suicide Prevention Education:  Contact Attempts: mother Lori [name]  [phone], (name of family member/significant other) has been identified by the patient as the family member/significant other with whom the patient will be residing, and identified as the person(s) who will aid the patient in the event of a mental health crisis.  With written consent from the patient, two attempts were made to provide suicide prevention education, prior to and/or following the patient's discharge.  We were unsuccessful in providing suicide prevention education.  A suicide education pamphlet was given to the patient to share with family/significant other.  Date and time of first attempt:05/16/2022  /  2:45PM  - HIPAA-compliant voicemail left Date and time of second attempt:  CSW team to continue efforts to provide SPE   [name] 05/16/2022, 2:45 PM

## 2022-05-16 NOTE — [facility] Suicide Risk Assessment (Signed)
Suicide Risk Assessment  Admission Assessment    [facility] Admission Suicide Risk Assessment   Nursing information obtained from:  Patient Demographic factors:  Caucasian Current Mental Status:  NA Loss Factors:  NA Historical Factors:  Impulsivity, Family history of mental illness or substance abuse Risk Reduction Factors:  Sense of responsibility to family, Positive social support  Total Time spent with patient: 45 minutes Principal Problem: Bipolar disorder, unspecified (HCC) Diagnosis:  Principal Problem:   Bipolar disorder, unspecified (HCC) Active Problems:   Human immunodeficiency virus (HIV) disease (HCC)   TOBACCO ABUSE   Essential hypertension   Cocaine abuse (HCC)   Alcohol abuse   Opioid abuse (HCC)   Cannabis use disorder   Chronic post-traumatic stress disorder (PTSD)  Subjective Data: Lori [name] is a 51 year old female with a psychiatric history of bipolar disorder, schizophrenia, depression, and substance use disorder who presented under IVC from [facility] ED for HI toward parents in the setting of substance use.    On Chart Review: No prior inpatient psychiatric admissions documented.  Continued Clinical Symptoms:  Alcohol Use Disorder Identification Test Final Score (AUDIT): 5 The "Alcohol Use Disorders Identification Test", Guidelines for Use in Primary Care, Second Edition.  World Science writer [facility]). Score between 0-7:  no or low risk or alcohol related problems. Score between 8-15:  moderate risk of alcohol related problems. Score between 16-19:  high risk of alcohol related problems. Score 20 or above:  warrants further diagnostic evaluation for alcohol dependence and treatment.   CLINICAL FACTORS:   Severe Anxiety and/or Agitation Bipolar Disorder:   Depressive phase Alcohol/Substance Abuse/Dependencies Unstable or Poor Therapeutic Relationship Previous Psychiatric Diagnoses and Treatments Medical Diagnoses and  Treatments/Surgeries   Musculoskeletal: Strength & Muscle Tone: within normal limits Gait & Station:  stooped gait Patient leans: Front  Psychiatric Specialty Exam:  Presentation  General Appearance:  Appropriate for Environment; Casual  Eye Contact: Good  Speech: Clear and Coherent; Normal Rate  Speech Volume: Normal  Handedness: Right   Mood and Affect  Mood: Depressed; Anxious  Affect: Full Range; Appropriate   Thought Process  Thought Processes: Coherent; Linear  Descriptions of Associations:Intact  Orientation:Full (Time, Place and Person)  Thought Content:Logical; WDL  History of Schizophrenia/Schizoaffective disorder:No data recorded Duration of Psychotic Symptoms:No data recorded Hallucinations:Hallucinations: None  Ideas of Reference:None  Suicidal Thoughts:Suicidal Thoughts: No  Homicidal Thoughts:Homicidal Thoughts: No HI Passive Intent and/or Plan: Without Intent; Without Plan   Sensorium  Memory: Immediate Fair; Recent Fair  Judgment: Impaired  Insight: Fair   Chartered certified accountant: Fair  Attention Span: Fair  Recall: [employer] of Knowledge: Fair  Language: Fair   Psychomotor Activity  Psychomotor Activity: Psychomotor Activity: Normal   Assets  Assets: Manufacturing systems engineer; Desire for Improvement; Housing; Social Support   Sleep  Sleep: Sleep: Fair Number of Hours of Sleep: 0    Physical Exam: See H&P Blood pressure 102/66, pulse 70, temperature 98.4 F (36.9 C), temperature source Oral, resp. rate 18, height 5\' 4"  (1.626 m), weight 86.6 kg, SpO2 98 %. Body mass index is 32.79 kg/m.   COGNITIVE FEATURES THAT CONTRIBUTE TO RISK:  Loss of executive function    SUICIDE RISK:   Mild:  Suicidal ideation of limited frequency, intensity, duration, and specificity.  There are no identifiable plans, no associated intent, mild dysphoria and related symptoms, good self-control (both  objective and subjective assessment), few other risk factors, and identifiable protective factors, including available and accessible social support.  PLAN OF  CARE:   See H&P for full plan of care  I certify that inpatient services furnished can reasonably be expected to improve the patient's condition.   Lamar Sprinkles, MD 05/16/2022, 3:33 PM

## 2022-05-16 NOTE — Progress Notes (Signed)
ed     Adult Psychoeducational Group Note  Date:  05/16/2022 Time:  1430-1500 Group Topic/Focus: Mental Health Relapses/Medication  Participation Level:  Active   Participation Quality:  Appropriate   Affect:  Appropriate   Cognitive:  Appropriate   Insight: Appropriate   Engagement in Group:  Engaged   Modes of Intervention:  Giving examples, talking about triggers of mental health relapses, stressors   Additional Comments:  N/A.     Harless Litten RN 05/16/22 1506

## 2022-05-16 NOTE — [facility] Suicide Risk Assessment (Signed)
[facility] INPATIENT:  Family/Significant Other Suicide Prevention Education  Suicide Prevention Education:  Education Completed; mother Joann Jorge   [phone],  (name of family member/significant other) has been identified by the patient as the family member/significant other with whom the patient will be residing, and identified as the person(s) who will aid the patient in the event of a mental health crisis (suicidal ideations/suicide attempt).    Mother states that the patient does not have access to any firearms.  Her biggest concern about the patient is her drug use, particularly crack cocaine, which she states the patient has used for 40 years.  As the parents are getting older (in their 65s) and infirm (father is about to be in a wheelchair), they can no longer go to the lengths to look for and protect the patient.  They are already familiar with [place] and have the number.  Mother asks that the patient be referred to [facility] for ongoing treatment.  With written consent from the patient, the family member/significant other has been provided the following suicide prevention education, prior to the and/or following the discharge of the patient.  The suicide prevention education provided includes the following: Suicide risk factors Suicide prevention and interventions National Suicide Hotline telephone number [facility] assessment telephone number [facility] Emergency Assistance 911 [facility] and/or Residential [facility] Unit telephone number  Request made of family/significant other to: Remove weapons (e.g., guns, rifles, knives), all items previously/currently identified as safety concern.   Remove drugs/medications (over-the-counter, prescriptions, illicit drugs), all items previously/currently identified as a safety concern.  The family member/significant other verbalizes understanding of the suicide prevention education  information provided.  The family member/significant other agrees to remove the items of safety concern listed above.  Carloyn Jaeger Grossman-Orr 05/16/2022, 2:53 PM

## 2022-05-17 ENCOUNTER — Encounter (HOSPITAL_COMMUNITY): Payer: Self-pay

## 2022-05-17 LAB — LIPID PANEL
Cholesterol: 195 mg/dL (ref 0–200)
HDL: 38 mg/dL — ABNORMAL LOW (ref >40)
LDL Cholesterol: 121 mg/dL — ABNORMAL HIGH (ref 0–99)
Total CHOL/HDL Ratio: 5.1 RATIO
Triglycerides: 182 mg/dL — ABNORMAL HIGH (ref <150)
VLDL: 36 mg/dL (ref 0–40)

## 2022-05-17 LAB — COMPREHENSIVE METABOLIC PANEL
ALT: 20 U/L (ref 0–44)
AST: 24 U/L (ref 15–41)
Albumin: 3.9 g/dL (ref 3.5–5.0)
Alkaline Phosphatase: 51 U/L (ref 38–126)
Anion gap: 9 (ref 5–15)
BUN: 24 mg/dL — ABNORMAL HIGH (ref 6–20)
CO2: 26 mmol/L (ref 22–32)
Calcium: 8.7 mg/dL — ABNORMAL LOW (ref 8.9–10.3)
Chloride: 105 mmol/L (ref 98–111)
Creatinine, Ser: 1.32 mg/dL — ABNORMAL HIGH (ref 0.44–1.00)
GFR, Estimated: 49 mL/min — ABNORMAL LOW (ref >60)
Glucose, Bld: 105 mg/dL — ABNORMAL HIGH (ref 70–99)
Potassium: 3.7 mmol/L (ref 3.5–5.1)
Sodium: 140 mmol/L (ref 135–145)
Total Bilirubin: 0.2 mg/dL — ABNORMAL LOW (ref 0.3–1.2)
Total Protein: 6.8 g/dL (ref 6.5–8.1)

## 2022-05-17 LAB — TSH: TSH: 0.466 u[IU]/mL (ref 0.350–4.500)

## 2022-05-17 MED ORDER — POLYETHYLENE GLYCOL 3350 17 G PO PACK
17.0000 g | PACK | Freq: Every day | ORAL | Status: DC
  Administered 2022-05-17: 17 g via ORAL
  Filled 2022-05-17 (×5): qty 1

## 2022-05-17 MED ORDER — DOCUSATE SODIUM 100 MG PO CAPS
100.0000 mg | ORAL_CAPSULE | Freq: Two times a day (BID) | ORAL | Status: DC
  Administered 2022-05-17: 100 mg via ORAL
  Filled 2022-05-17 (×8): qty 1

## 2022-05-17 MED ORDER — ARIPIPRAZOLE 10 MG PO TABS
10.0000 mg | ORAL_TABLET | Freq: Every day | ORAL | Status: DC
  Administered 2022-05-18 – 2022-05-19 (×2): 10 mg via ORAL
  Filled 2022-05-17 (×4): qty 1
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
## 2022-05-17 NOTE — BH IP Treatment Plan (Signed)
Interdisciplinary Treatment and Diagnostic Plan Update  05/17/2022 Time of Session: 9:40am Lori [name] MRN: [id number]  Principal Diagnosis: Bipolar disorder, unspecified (HCC)  Secondary Diagnoses: Principal Problem:   Bipolar disorder, unspecified (HCC) Active Problems:   Human immunodeficiency virus (HIV) disease (HCC)   TOBACCO ABUSE   Essential hypertension   Cocaine abuse (HCC)   Alcohol abuse   Opioid abuse (HCC)   Cannabis use disorder   Chronic post-traumatic stress disorder (PTSD)   Current Medications:  Current Facility-Administered Medications  Medication Dose Route Frequency Provider Last Rate Last Admin   acetaminophen (TYLENOL) tablet 650 mg  650 mg Oral Q6H PRN Ajibola, Ene A, NP   650 mg at 05/16/22 0814   alum & mag hydroxide-simeth (MAALOX/MYLANTA) 200-200-20 MG/5ML suspension 30 mL  30 mL Oral Q4H PRN Ajibola, Ene A, NP       ARIPiprazole (ABILIFY) tablet 5 mg  5 mg Oral Daily Lamar Sprinkles, MD   5 mg at 05/17/22 1224   haloperidol (HALDOL) tablet 5 mg  5 mg Oral Q6H PRN Lamar Sprinkles, MD       And   benztropine (COGENTIN) tablet 1 mg  1 mg Oral Q6H PRN Lamar Sprinkles, MD       haloperidol lactate (HALDOL) injection 5 mg  5 mg Intramuscular Q6H PRN Lamar Sprinkles, MD       And   benztropine mesylate (COGENTIN) injection 1 mg  1 mg Intramuscular Q6H PRN Lamar Sprinkles, MD       bictegravir-emtricitabine-tenofovir AF (BIKTARVY) 50-200-25 MG per tablet 1 tablet  1 tablet Oral Daily Lamar Sprinkles, MD   1 tablet at 05/17/22 8250   chlordiazePOXIDE (LIBRIUM) capsule 25 mg  25 mg Oral Q6H PRN Lamar Sprinkles, MD       cloNIDine (CATAPRES) tablet 0.1 mg  0.1 mg Oral QHS Lamar Sprinkles, MD   0.1 mg at 05/16/22 2031   hydrOXYzine (ATARAX) tablet 25 mg  25 mg Oral TID PRN Ajibola, Ene A, NP   25 mg at 05/17/22 0370   hydrOXYzine (ATARAX) tablet 25 mg  25 mg Oral Q6H PRN Lamar Sprinkles, MD   25 mg at 05/16/22 2032   ibuprofen (ADVIL) tablet 600 mg  600  mg Oral Q8H PRN Lamar Sprinkles, MD   600 mg at 05/17/22 4888   loperamide (IMODIUM) capsule 2-4 mg  2-4 mg Oral PRN Lamar Sprinkles, MD       LORazepam (ATIVAN) tablet 2 mg  2 mg Oral Q6H PRN Lamar Sprinkles, MD       Or   LORazepam (ATIVAN) injection 2 mg  2 mg Intramuscular Q6H PRN Lamar Sprinkles, MD       magnesium hydroxide (MILK OF MAGNESIA) suspension 30 mL  30 mL Oral Daily PRN Ajibola, Ene A, NP       multivitamin with minerals tablet 1 tablet  1 tablet Oral Daily Lamar Sprinkles, MD   1 tablet at 05/17/22 9169   Muscle Rub CREA   Topical PRN Lamar Sprinkles, MD       nicotine (NICODERM CQ - dosed in mg/24 hours) patch 14 mg  14 mg Transdermal Daily Lamar Sprinkles, MD   14 mg at 05/17/22 0808   ondansetron (ZOFRAN-ODT) disintegrating tablet 4 mg  4 mg Oral Q6H PRN Lamar Sprinkles, MD       traZODone (DESYREL) tablet 50 mg  50 mg Oral QHS PRN Ajibola, Ene A, NP   50 mg at 05/16/22 2032   PTA Medications: Medications Prior  to Admission  Medication Sig Dispense Refill Last Dose   acetaminophen (TYLENOL) 325 MG tablet Take 650 mg by mouth every 6 (six) hours as needed.      bictegravir-emtricitabine-tenofovir AF (BIKTARVY) 50-200-25 MG TABS tablet TAKE 1 TABLET BY MOUTH DAILY. 30 tablet 1    citalopram (CELEXA) 10 MG tablet Take 1 tablet (10 mg total) by mouth daily. (Patient not taking: Reported on 05/15/2022) 30 tablet 1    fluticasone (FLONASE) 50 MCG/ACT nasal spray Place 2 sprays into both nostrils daily. 15 g 2    ibuprofen (ADVIL) 200 MG tablet Take 600 mg by mouth every 6 (six) hours as needed for moderate pain.      lisinopril (ZESTRIL) 5 MG tablet Take 1 tablet (5 mg total) by mouth daily. 90 tablet 1     Patient Stressors:    Patient Strengths:    Treatment Modalities: Medication Management, Group therapy, Case management,  1 to 1 session with clinician, Psychoeducation, Recreational therapy.   Physician Treatment Plan for Primary Diagnosis: Bipolar disorder,  unspecified ([place]) Long Term Goal(s): Improvement in symptoms so as ready for discharge   Short Term Goals: Ability to identify changes in lifestyle to reduce recurrence of condition will improve Ability to verbalize feelings will improve Ability to disclose and discuss suicidal ideas Ability to demonstrate self-control will improve Ability to identify and develop effective coping behaviors will improve Ability to maintain clinical measurements within normal limits will improve Compliance with prescribed medications will improve Ability to identify triggers associated with substance abuse/mental health issues will improve  Medication Management: Evaluate patient's response, side effects, and tolerance of medication regimen.  Therapeutic Interventions: 1 to 1 sessions, Unit Group sessions and Medication administration.  Evaluation of Outcomes: Progressing  Physician Treatment Plan for Secondary Diagnosis: Principal Problem:   Bipolar disorder, unspecified ([place]) Active Problems:   Human immunodeficiency virus (HIV) disease ([place])   TOBACCO ABUSE   Essential hypertension   Cocaine abuse ([place])   Alcohol abuse   Opioid abuse ([place])   Cannabis use disorder   Chronic post-traumatic stress disorder (PTSD)  Long Term Goal(s): Improvement in symptoms so as ready for discharge   Short Term Goals: Ability to identify changes in lifestyle to reduce recurrence of condition will improve Ability to verbalize feelings will improve Ability to disclose and discuss suicidal ideas Ability to demonstrate self-control will improve Ability to identify and develop effective coping behaviors will improve Ability to maintain clinical measurements within normal limits will improve Compliance with prescribed medications will improve Ability to identify triggers associated with substance abuse/mental health issues will improve     Medication Management: Evaluate patient's response, side effects, and  tolerance of medication regimen.  Therapeutic Interventions: 1 to 1 sessions, Unit Group sessions and Medication administration.  Evaluation of Outcomes: Progressing   RN Treatment Plan for Primary Diagnosis: Bipolar disorder, unspecified ([place]) Long Term Goal(s): Knowledge of disease and therapeutic regimen to maintain health will improve  Short Term Goals: Ability to remain free from injury will improve, Ability to verbalize frustration and anger appropriately will improve, Ability to demonstrate self-control, Ability to participate in decision making will improve, Ability to verbalize feelings will improve, Ability to disclose and discuss suicidal ideas, Ability to identify and develop effective coping behaviors will improve, and Compliance with prescribed medications will improve  Medication Management: RN will administer medications as ordered by provider, will assess and evaluate patient's response and provide education to patient for prescribed medication. RN will report any adverse and/or  side effects to prescribing provider.  Therapeutic Interventions: 1 on 1 counseling sessions, Psychoeducation, Medication administration, Evaluate responses to treatment, Monitor vital signs and CBGs as ordered, Perform/monitor CIWA, COWS, AIMS and Fall Risk screenings as ordered, Perform wound care treatments as ordered.  Evaluation of Outcomes: Progressing   LCSW Treatment Plan for Primary Diagnosis: Bipolar disorder, unspecified (HCC) Long Term Goal(s): Safe transition to appropriate next level of care at discharge, Engage patient in therapeutic group addressing interpersonal concerns.  Short Term Goals: Engage patient in aftercare planning with referrals and resources, Increase social support, Increase ability to appropriately verbalize feelings, Increase emotional regulation, Facilitate acceptance of mental health diagnosis and concerns, Facilitate patient progression through stages of change  regarding substance use diagnoses and concerns, Identify triggers associated with mental health/substance abuse issues, and Increase skills for wellness and recovery  Therapeutic Interventions: Assess for all discharge needs, 1 to 1 time with Social worker, Explore available resources and support systems, Assess for adequacy in community support network, Educate family and significant other(s) on suicide prevention, Complete Psychosocial Assessment, Interpersonal group therapy.  Evaluation of Outcomes: Progressing   Progress in Treatment: Attending groups: Yes. Participating in groups: Yes. Taking medication as prescribed: Yes. Toleration medication: Yes. Family/Significant other contact made: Yes, individual(s) contacted:   Lori [name] [phone] Patient understands diagnosis: Yes. Discussing patient identified problems/goals with staff: Yes. Medical problems stabilized or resolved: Yes. Denies suicidal/homicidal ideation: Yes. Issues/concerns per patient self-inventory: No.  New problem(s) identified: No, Describe:  none reported   New Short Term/Long Term Goal(s):   medication stabilization, elimination of SI thoughts, development of comprehensive mental wellness plan.    Patient Goals:  Pt states, "I want to get on right medications, a referral for psychiatry , improve my energy and work on coping mechanisms."  Discharge Plan or Barriers: Patient recently admitted. CSW will continue to follow and assess for appropriate referrals and possible discharge planning.    Reason for Continuation of Hospitalization: Aggression Anxiety Depression Homicidal ideation Medication stabilization Withdrawal symptoms  Estimated Length of Stay: 3-7 days  Last 3 [place] Suicide Severity Risk Score: Flowsheet Row Ad[facility] (Current) [facility] from 05[facility]15/2022 in [facility]GENC[facility]PART[facility] ED from [facility]/25/2021 in [facility] RISK CATEGORY No Risk No Risk No Risk       Last PHQ 2/9 Scores:    08/28/2021   10:32 AM 11/27/2020    3:34 PM 10/30/2020    3:30 PM  Depression screen PHQ 2/9  Decreased Interest 0 0 0  Down, Depressed, Hopeless 1 0 0  PHQ - 2 Score 1 0 0    Scribe for Treatment Team: Beatris Si, LCSW 05/17/2022 11:23 AM

## 2022-05-17 NOTE — Progress Notes (Signed)
   05/17/22 1955  Psych Admission Type (Psych Patients Only)  Admission Status Voluntary  Psychosocial Assessment  Patient Complaints Anxiety  Eye Contact Fair  Facial Expression Animated  Affect Appropriate to circumstance  Speech Logical/coherent  Interaction Assertive  Motor Activity Other (Comment) (wnl)  Appearance/Hygiene Unremarkable  Behavior Characteristics Cooperative;Anxious  Mood Anxious;Pleasant  Thought Process  Coherency WDL  Content WDL  Delusions None reported or observed  Perception WDL  Hallucination None reported or observed  Judgment WDL  Confusion None  Danger to Self  Current suicidal ideation? Denies  Danger to Others  Danger to Others None reported or observed   Progress note   D: Pt seen in her room. Pt denies SI, HI, AVH. Pt rates pain  0/10. Pt rates anxiety  3/10 and depression  0/10. Pt stated that she hadn't had a bowel movement since last Thursday. Today she was given a laxative and now she is going to the bathroom frequently. "It's not a problem really. I had all that stuff in me and I know I have to get it out. I will probably spend most of my time this evening in the room staying near the bathroom." Pt is pleasant and upbeat. Said that she learned coping skills today and wrote them down in case she forgets. Pt denies any withdrawal symptoms. Pt asked that her nicotine patch be increased to 21 mg d/t her constant cravings for nicotine. Pt said she didn't get good sleep last night but didn't take any naps today and is hoping for better rest. No other concerns noted at this time.  A: Pt provided support and encouragement. Pt given scheduled medication as prescribed. PRNs as appropriate. Q15 min checks for safety.   R: Pt safe on the unit. Will continue to monitor.

## 2022-05-17 NOTE — Progress Notes (Signed)
   05/16/22 2100  Psych Admission Type (Psych Patients Only)  Admission Status Voluntary  Psychosocial Assessment  Patient Complaints Anxiety  Eye Contact Fair  Facial Expression Animated  Affect Appropriate to circumstance  Speech Logical/coherent  Interaction Assertive  Motor Activity Slow  Appearance/Hygiene Unremarkable  Behavior Characteristics Cooperative;Appropriate to situation  Mood Pleasant  Thought Process  Coherency WDL  Content WDL  Delusions None reported or observed  Perception WDL  Hallucination None reported or observed  Judgment WDL  Confusion WDL  Danger to Self  Current suicidal ideation? Denies  Danger to Others  Danger to Others None reported or observed

## 2022-05-17 NOTE — Group Note (Signed)
Recreation Therapy Group Note   Group Topic:Coping Skills  Group Date: 05/17/2022 Start Time: 1000 End Time: 1035 Facilitators: Lori [name], LRT,CTRS Location: 500 Hall Dayroom   Goal Area(s) Addresses:  Patient will identify positive stress management techniques. Patient will identify benefits of using stress management post d/c.  Group Description:  Mind Map.  Patient was provided a blank template of a diagram with 32 blank boxes in a tiered system, branching from the center (similar to a bubble chart). LRT directed patients to label the middle of the diagram "Coping Skills".  Patients identified anger, depression, stress, anxiety, boredom, substance abuse, fear and paranoia as situations in which coping skills can be used.  Pt were directed to record their coping skills in the 2nd tier boxes. Patients were to then come up with 3 effective techniques to address each identified area.     Affect/Mood: Appropriate   Participation Level: Engaged   Participation Quality: Independent   Behavior: Appropriate   Speech/Thought Process: Focused   Insight: Good   Judgement: Good   Modes of Intervention: Worksheet   Patient Response to Interventions:  Engaged   Education Outcome:  Acknowledges education and In group clarification offered    Clinical Observations/Individualized Feedback: Pt was focused, engaged and attentive during group session.  Pt was also receptive to the coping skills peers were able to come up with.  Pt came with the coping skills of kick something, being mindful, cleaning, take a shower/bath, get off the couch/bed, delegate, cuddle with animals and talk it out.     Plan: Continue to engage patient in RT group sessions 2-3x/week.   Lori [name], LRT,CTRS 05/17/2022 12:25 PM

## 2022-05-17 NOTE — Group Note (Signed)
LCSW Group Therapy Note   Group Date: 05/17/2022 Start Time: 1300 End Time: 1400   Type of Therapy and Topic:  Group Therapy: Relationship Check-Ins   Participation Level:  Active  Description of Group:    In this group patients will be encouraged to explore how individuals communicate with one another appropriately and inappropriately. Patients will be guided to discuss their thoughts, feelings, and behaviors related to barriers communicating feelings, needs, and stressors. The group will process together ways to execute positive and appropriate communications, with attention given to how one use behavior, tone, and body language to communicate. Patient will be encouraged to reflect on an incident where they were successfully able to communicate and the factors that they believe helped them to communicate. Each patient will be encouraged to identify specific changes they are motivated to make in order to overcome communication barriers with self, peers, authority, and parents. This group will be process-oriented, with patients participating in exploration of their own experiences as well as giving and receiving support and challenging self as well as other group members.  Therapeutic Goals: Patient will identify how people communicate (body language, facial expression, and electronics) Also discuss tone, voice and how these impact what is communicated and how the message is perceived.  Patient will identify feelings (such as fear or worry), thought process and behaviors related to why people internalize feelings rather than express self openly. Patient will identify two changes they are willing to make to overcome communication barriers. Members will then practice through Role Play how to communicate by utilizing psycho-education material (such as I Feel statements and acknowledging feelings rather than displacing on others)   Summary of Patient Progress Patient was active and attentive in  group. Patient followed along with provided worksheet and gave positive feedback. Patient was respectful of others and acknowledge how she would work on bettering her relationship check-ins within herself and family. Patient stayed the entire time of group and provided good insight to others who shared the same relationship check-in as her.     Therapeutic Modalities:   Cognitive Behavioral Therapy Solution Focused Therapy Motivational Interviewing Family Systems Approach   Beather Arbour 05/17/2022  2:33 PM

## 2022-05-17 NOTE — Progress Notes (Signed)
Adult Psychoeducational Group Note  Date:  05/17/2022 Time:  8:23 PM  Group Topic/Focus:  Wrap-Up Group:   The focus of this group is to help patients review their daily goal of treatment and discuss progress on daily workbooks.  Participation Level:  Did Not Attend  Participation Quality:  Did Not Attend  Affect:  Did Not Attend  Cognitive:  Did Not Attend  Insight: Did Not Attend  Engagement in Group:  Did Not Attend  Modes of Intervention:  Did Not Attend  Additional Comments:   Pt was encouraged but refused to attend group discussion.   Vevelyn Pat 05/17/2022, 8:23 PM

## 2022-05-17 NOTE — Progress Notes (Signed)
   05/16/22 2100  Psych Admission Type (Psych Patients Only)  Admission Status Voluntary  Psychosocial Assessment  Patient Complaints Anxiety  Eye Contact Fair  Facial Expression Animated  Affect Appropriate to circumstance  Speech Logical/coherent  Interaction Assertive  Motor Activity Slow  Appearance/Hygiene Unremarkable  Behavior Characteristics Cooperative;Appropriate to situation  Mood Pleasant  Thought Process  Coherency WDL  Content WDL  Delusions None reported or observed  Perception WDL  Hallucination None reported or observed  Judgment WDL  Confusion WDL  Danger to Self  Current suicidal ideation? Denies  Danger to Others  Danger to Others None reported or observed    

## 2022-05-17 NOTE — Progress Notes (Signed)
Recreation Therapy Notes  INPATIENT RECREATION THERAPY ASSESSMENT  Patient Details Name: Lori [name] MRN: [id number] DOB: 08-20-1970 Today's Date: 05/17/2022       Information Obtained From: Patient  Able to Participate in Assessment/Interview: Yes  Patient Presentation: Alert  Reason for Admission (Per Patient): Other (Comments) (Homicidal Thoughts)  Patient Stressors: Family, Other (Comment) ("life in general, finances, broken vehicle, my brother needs care and I don't want to do but doing it any way".)  Coping Skills:   Isolation, Arguments, Music, Deep Breathing, Substance Abuse, Impulsivity, Talk, Avoidance, Read, Hot Bath/Shower  Leisure Interests (2+):  Individual - Reading, Individual - Other (Comment) (Eat, Play with cats)  Frequency of Recreation/Participation: Other (Comment) (Read,Cats-Daily; Eat- when not doing drugs)  Awareness of Community Resources:  No (Pt stated she lives on [address] [place] land with a lot of wildlife.  Pt stated she walks around the property sometimes.)  Community Resources:     Current Use:    If no, Barriers?:    Expressed Interest in [employer] Information: No  County [place] Residence:  [place]  Patient Main Form of Transportation: Car  Patient Strengths:  Being stubborn  Patient Identified Areas of Improvement:  Patience, Having plans/Setting goals for the day; Be kinder to brother  Patient Goal for Hospitalization:  "not have the thoughts I had towards people who have been good to me, stay off drugs, improve memory/energy level and not have the same thoughts"  Current SI (including self-harm):  No  Current HI:  No  Current AVH: No  Staff Intervention Plan: Group Attendance, Collaborate with Interdisciplinary Treatment Team  Consent to Intern Participation: N/A   Lori [name], LRT,CTRS Lori [name] 05/17/2022, 12:55 PM

## 2022-05-18 ENCOUNTER — Other Ambulatory Visit (HOSPITAL_COMMUNITY): Payer: Self-pay

## 2022-05-18 LAB — T-HELPER CELLS (CD4) COUNT (NOT AT ARMC)
CD4 % Helper T Cell: 34 % (ref 33–65)
CD4 T Cell Abs: 807 /uL (ref 400–1790)

## 2022-05-18 MED ORDER — ADULT MULTIVITAMIN W/MINERALS CH: 1.0000 | ORAL_TABLET | Freq: Every day | ORAL | Status: DC

## 2022-05-18 MED ORDER — MUSCLE RUB 10-15 % EX CREA: 1.0000 | TOPICAL_CREAM | CUTANEOUS | 0 refills | Status: DC | PRN

## 2022-05-18 MED ORDER — NICOTINE 21 MG/24HR TD PT24
21.0000 mg | MEDICATED_PATCH | Freq: Every day | TRANSDERMAL | 0 refills | Status: DC
  Filled 2022-05-18: qty 28, 28d supply, fill #0

## 2022-05-18 MED ORDER — NICOTINE 21 MG/24HR TD PT24
21.0000 mg | MEDICATED_PATCH | Freq: Every day | TRANSDERMAL | Status: DC
  Administered 2022-05-18 – 2022-05-19 (×2): 21 mg via TRANSDERMAL
  Filled 2022-05-18 (×2): qty 1
  Filled 2022-05-18: qty 14
  Filled 2022-05-18 (×3): qty 1

## 2022-05-18 MED ORDER — HYDROXYZINE HCL 25 MG PO TABS
25.0000 mg | ORAL_TABLET | Freq: Three times a day (TID) | ORAL | 0 refills | Status: AC | PRN
  Filled 2022-05-18: qty 30, 10d supply, fill #0

## 2022-05-18 MED ORDER — DOCUSATE SODIUM 100 MG PO CAPS: 100.0000 mg | ORAL_CAPSULE | Freq: Two times a day (BID) | ORAL | 0 refills | Status: DC

## 2022-05-18 MED ORDER — ARIPIPRAZOLE 10 MG PO TABS
10.0000 mg | ORAL_TABLET | Freq: Every day | ORAL | 0 refills | Status: DC
  Filled 2022-05-18: qty 30, 30d supply, fill #0

## 2022-05-18 MED ORDER — TRAZODONE HCL 50 MG PO TABS
50.0000 mg | ORAL_TABLET | Freq: Every evening | ORAL | 0 refills | Status: DC | PRN
  Filled 2022-05-18: qty 30, 30d supply, fill #0

## 2022-05-18 MED ORDER — CLONIDINE HCL 0.1 MG PO TABS
0.1000 mg | ORAL_TABLET | Freq: Every day | ORAL | 0 refills | Status: DC
  Filled 2022-05-18: qty 30, 30d supply, fill #0

## 2022-05-18 NOTE — Progress Notes (Signed)
   05/18/22 1200  Psych Admission Type (Psych Patients Only)  Admission Status Voluntary  Psychosocial Assessment  Patient Complaints Anxiety  Eye Contact Fair  Facial Expression Animated  Affect Appropriate to circumstance  Speech Logical/coherent  Interaction Assertive  Motor Activity Other (Comment)  Appearance/Hygiene Unremarkable  Behavior Characteristics Cooperative  Mood Pleasant  Thought Process  Coherency WDL  Content WDL  Delusions None reported or observed  Perception WDL  Hallucination None reported or observed  Judgment WDL  Confusion None  Danger to Self  Current suicidal ideation? Denies  Danger to Others  Danger to Others None reported or observed

## 2022-05-18 NOTE — Progress Notes (Signed)
Pt c/o headache 5/10 and chronic joint aches 3/10. Pt wanted Tylenol and ibuprofen but was told that only one pain med at a time is given. Pt opted for Tylenol which she says works better for her headache. Pt denying any withdrawal symptoms this morning. States she slept well last night.

## 2022-05-18 NOTE — Progress Notes (Signed)
   05/18/22 0527  Sleep  Number of Hours 7.25

## 2022-05-18 NOTE — Group Note (Signed)
[facility] LCSW Group Therapy Note   Group Date: 05/18/2022 Start Time: 1100 End Time: 1200   Type of Therapy/Topic:  Group Therapy:  Balance in Life  Participation Level:  Did Not Attend   Description of Group:    This group will address the concept of balance and how it feels and looks when one is unbalanced. Patients will be encouraged to process areas in their lives that are out of balance, and identify reasons for remaining unbalanced. Facilitators will guide patients utilizing problem- solving interventions to address and correct the stressor making their life unbalanced. Understanding and applying boundaries will be explored and addressed for obtaining  and maintaining a balanced life. Patients will be encouraged to explore ways to assertively make their unbalanced needs known to significant others in their lives, using other group members and facilitator for support and feedback.  Therapeutic Goals: Patient will identify two or more emotions or situations they have that consume much of in their lives. Patient will identify signs/triggers that life has become out of balance:  Patient will identify two ways to set boundaries in order to achieve balance in their lives:  Patient will demonstrate ability to communicate their needs through discussion and/or role plays  Summary of Patient Progress:        Therapeutic Modalities:   Cognitive Behavioral Therapy Solution-Focused Therapy Assertiveness Training   Celesta Funderburk S Jaycen Vercher, LCSW 

## 2022-05-18 NOTE — Progress Notes (Signed)
The patient rated her day as a 2 out of 10. She did not explain any further. Her goal for tomorrow is to get some sleep.

## 2022-05-18 NOTE — Group Note (Signed)
Recreation Therapy Group Note   Group Topic:Health and Wellness  Group Date: 05/18/2022 Start Time: 1000 End Time: 1035 Facilitators: Deantre Bourdon-McCall, LRT,CTRS Location: [address] Dayroom   Goal Area(s) Addresses:  Patient will define components of whole wellness. Patient will verbalize benefit of whole wellness.   Group Description:  Exercise.  LRT discussed the importance of physical health with patients.  Patients were then instructed group would consist of them taking turns leading the group in exercises of their choosing.  LRT informed patients to give at least 30 good minutes of movement.  LRT also encouraged patients to drink water as needed and to take breaks if needed.   Affect/Mood: Appropriate   Participation Level: Engaged   Participation Quality: Independent   Behavior: Appropriate   Speech/Thought Process: Focused   Insight: Good   Judgement: Good   Modes of Intervention: Activity   Patient Response to Interventions:  Engaged   Education Outcome:  Acknowledges education and In group clarification offered    Clinical Observations/Individualized Feedback: Pt was engaged and social with peers.  Pt completed the exercises she was able to.  Pt was also encouraging to peers during session.  Pt also talked about how she needs to take more time to be active.      Plan: Continue to engage patient in RT group sessions 2-3x/week.   Saurabh Hettich-McCall, LRT,CTRS 05/18/2022 12:27 PM
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
## 2022-05-19 ENCOUNTER — Other Ambulatory Visit (HOSPITAL_COMMUNITY): Payer: Self-pay

## 2022-05-19 DIAGNOSIS — F319 Bipolar disorder, unspecified: Principal | ICD-10-CM

## 2022-05-19 LAB — HEMOGLOBIN A1C
Hgb A1c MFr Bld: 5.6 % (ref 4.8–5.6)
Mean Plasma Glucose: 114 mg/dL

## 2022-05-19 MED ORDER — LISINOPRIL 5 MG PO TABS
5.0000 mg | ORAL_TABLET | Freq: Once | ORAL | Status: DC
  Administered 2022-05-19: 5 mg via ORAL
  Filled 2022-05-19: qty 1

## 2022-05-19 MED ORDER — LISINOPRIL 5 MG PO TABS
5.0000 mg | ORAL_TABLET | Freq: Every day | ORAL | Status: DC
  Filled 2022-05-19 (×2): qty 1

## 2022-05-19 MED ORDER — LISINOPRIL 5 MG PO TABS
5.0000 mg | ORAL_TABLET | Freq: Every day | ORAL | 0 refills | Status: DC
  Filled 2022-05-19 – 2022-06-04 (×2): qty 30, 30d supply, fill #0

## 2022-05-19 NOTE — [facility] Suicide Risk Assessment (Addendum)
Suicide Risk Assessment  Discharge Assessment    [facility] Discharge Suicide Risk Assessment   Principal Problem: Bipolar disorder, unspecified (HCC) Discharge Diagnoses: Principal Problem:   Bipolar disorder, unspecified (HCC) Active Problems:   Human immunodeficiency virus (HIV) disease (HCC)   TOBACCO ABUSE   Essential hypertension   Cocaine abuse (HCC)   Alcohol abuse   Opioid abuse (HCC)   Cannabis use disorder   Chronic post-traumatic stress disorder (PTSD)   Total Time spent with patient: 15 minutes  Lori [name] is a 51 year old female with a psychiatric history of bipolar disorder, schizophrenia, depression, and substance use disorder who presented under IVC from [facility] ED for HI toward parents in the setting of substance use.    During the patient's hospitalization, patient had extensive initial psychiatric evaluation, and follow-up psychiatric evaluations every day.  Psychiatric diagnoses provided upon initial assessment:  Bipolar Disorder, unspecified, most recent episode depressed Tobacco Abuse Essential hypertension Cocaine abuse (HCC) Alcohol abuse Opioid abuse (HCC) Cannabis use disorder Chronic post-traumatic stress disorder (PTSD)   Patient's psychiatric medications were adjusted on admission:  Started Abilify 5 mg QD on admission for mood stabilization, then increased to 10 mg daily Started clonidine 0.1 mg nightly on admission for nightmares and trauma related anxiety.    During the hospitalization, other adjustments were made to the patient's psychiatric medication regimen: None  Patient's care was discussed during the interdisciplinary team meeting every day during the hospitalization.  The patient denies having side effects to prescribed psychiatric medication.  Gradually, patient started adjusting to milieu. The patient was evaluated each day by a clinical provider to ascertain response to treatment. Improvement was noted by the patient's  report of decreasing symptoms, improved sleep and appetite, affect, medication tolerance, behavior, and participation in unit programming.  Patient was asked each day to complete a self inventory noting mood, mental status, pain, new symptoms, anxiety and concerns.    Symptoms were reported as significantly decreased or resolved completely by discharge.   On day of discharge, the patient reports that their mood is stable. The patient denied having suicidal thoughts for more than 48 hours prior to discharge.  Patient denies having homicidal thoughts.  Patient denies having auditory hallucinations.  Patient denies any visual hallucinations or other symptoms of psychosis. The patient was motivated to continue taking medication with a goal of continued improvement in mental health.   The patient reports their target psychiatric symptoms of depression and homicidal ideation all responded well to the psychiatric medications, and the patient reports overall benefit other psychiatric hospitalization. Supportive psychotherapy was provided to the patient. The patient also participated in regular group therapy while hospitalized. Coping skills, problem solving as well as relaxation therapies were also part of the unit programming.  Labs were reviewed with the patient, and abnormal results were discussed with the patient.  The patient is able to verbalize their individual safety plan to this provider.  # It is recommended to the patient to continue psychiatric medications as prescribed, after discharge from the hospital.    # It is recommended to the patient to follow up with your outpatient psychiatric provider and PCP.  # It was discussed with the patient, the impact of alcohol, drugs, tobacco have been there overall psychiatric and medical wellbeing, and total abstinence from substance use was recommended the patient.ed.  # Prescriptions provided or sent directly to preferred pharmacy at discharge. Patient  agreeable to plan. Given opportunity to ask questions. Appears to feel comfortable with discharge.    #  In the event of worsening symptoms, the patient is instructed to call the crisis hotline, 911 and or go to the nearest ED for appropriate evaluation and treatment of symptoms. To follow-up with primary care provider for other medical issues, concerns and or health care needs  # Patient was discharged Home on 05/19/22  with a plan to follow up as noted below.    Musculoskeletal: Strength & Muscle Tone: within normal limits Gait & Station: normal Patient leans: N/A    Psychiatric Specialty Exam:   Presentation  General Appearance:  Casual; Fairly Groomed   Eye Contact: Good   Speech: Clear and Coherent   Speech Volume: Normal   Handedness: Right     Mood and Affect  Mood: "" Good but nervous about discharge today"   Affect: Appropriate; Congruent     Thought Process  Thought Processes: Coherent; Linear   Descriptions of Associations:Intact   Orientation:Full (Time, Place and Person)   Thought Content:Logical   History of Schizophrenia/Schizoaffective disorder:No data recorded Duration of Psychotic Symptoms:No data recorded Hallucinations:Hallucinations: None   Ideas of Reference:None   Suicidal Thoughts:Suicidal Thoughts: No   Homicidal Thoughts:Homicidal Thoughts: No     Sensorium  Memory: Immediate Good; Recent Good; Remote Fair   Judgment: Good   Insight: Good     Executive Functions  Concentration: Good   Attention Span: Good   Recall: Good   Fund of Knowledge: Good   Language: Good     Psychomotor Activity  Psychomotor Activity:Psychomotor Activity: Normal     Assets  Assets: Communication Skills; Desire for Improvement; Housing; Resilience; Social Support     Sleep  Sleep:Sleep: Good       Physical Exam: Physical Exam Constitutional:      General: She is not in acute distress.    Appearance: Normal  appearance.  Pulmonary:     Effort: Pulmonary effort is normal.  Neurological:     General: No focal deficit present.     Mental Status: She is alert and oriented to person, place, and time.     Cranial Nerves: No cranial nerve deficit.  Psychiatric:        Mood and Affect: Mood normal.        Behavior: Behavior normal.        Thought Content: Thought content normal.        Judgment: Judgment normal.      Review of Systems  Gastrointestinal:  Negative for constipation.  Neurological:  Negative for headaches.  Psychiatric/Behavioral:  Negative for depression, hallucinations, memory loss, substance abuse and suicidal ideas. The patient is not nervous/anxious and does not have insomnia.       Blood pressure (!) 151/101, pulse (!) 58, temperature 97.8 F (36.6 C), temperature source Oral, resp. rate 16, height 5\' 4"  (1.626 m), weight 86.6 kg, SpO2 97 %. Body mass index is 32.79 kg/m.  Mental Status Per Nursing Assessment::   On Admission:  NA  Demographic factors:  Caucasian Current Mental Status:  NA Loss Factors:  NA Historical Factors:  Impulsivity, Family history of mental illness or substance abuse Risk Reduction Factors:  Sense of responsibility to family, Positive social support   Continued Clinical Symptoms:  Previous Psychiatric Diagnoses and Treatments  Cognitive Features That Contribute To Risk:  None    Suicide Risk:  Mild: There are no identifiable suicide plans, no associated intent, mild dysphoria and related symptoms, good self-control (both objective and subjective assessment), few other risk factors, and identifiable protective factors, including available  and accessible social support.    Follow-up Information     [facility] Call.   Why: This provider is not accepting new patients or inactive patients until into 2024.  You may call to check on the status and possibly schedule appointments to re-establish for medication  management services and for therapy services with Pushpinder Gill. Contact information: [address] [place] [address] [phone]         Services, [facility]. Go on 05/20/2022.   Why: You have a hospital follow up appointment, to obtain therapy and medication management services on 05/20/22 at 9:00 am. * Temporary Address and Phone number:  * [address], [place], [place].  # [phone].  Please bring photo ID, all insurance cards, and proof of any income. Contact information: [address] [place] [address] [phone]                 Plan Of Care/Follow-up recommendations:  Activity: as tolerated  Diet: heart healthy  Other: -Follow-up with your outpatient psychiatric provider -instructions on appointment date, time, and address (location) are provided to you in discharge paperwork.  -Take your psychiatric medications as prescribed at discharge - instructions are provided to you in the discharge paperwork  -Follow-up with outpatient primary care doctor and other specialists -for management of preventative medicine and chronic medical disease, including:  #HIV - Resume home Biktarvy    #Hypertension, essential - Received clonidine 0.1 mg nightly tonight. - Monitor BP as detailed in discharge summary  -Testing: Follow-up with outpatient provider for abnormal lab results:   -Recommend abstinence from alcohol, tobacco, and other illicit drug use at discharge.   -If your psychiatric symptoms recur, worsen, or if you have side effects to your psychiatric medications, call your outpatient psychiatric provider, 911, [phone] or go to the nearest emergency department.  -If suicidal thoughts recur, call your outpatient psychiatric provider, 911, [phone] or go to the nearest emergency department.     Lorri Frederick, MD 05/19/2022, 8:04 AM

## 2022-05-19 NOTE — Group Note (Signed)
Recreation Therapy Group Note   Group Topic:Team Building  Group Date: 05/19/2022 Start Time: 0935 End Time: 1000 Facilitators: Shinika Estelle-McCall, LRT,CTRS Location: [address] Day[address]   Goal Area(s) Addresses:  Patient will effectively work with peer towards shared goal.  Patient will identify skills used to make activity successful.  Patient will identify how skills used during activity can be applied to reach post d/c goals.    Group Description: [employer]. In teams of 5-6, patients were given 11 craft pipe cleaners. Using the materials provided, patients were instructed to compete again the opposing team(s) to build the tallest free-standing structure from floor level. The activity was timed; difficulty increased by Clinical research associate as Production designer, theatre/television/film continued.  Systematically resources were removed with additional directions for example, placing one arm behind their back, working in silence, and shape stipulations. LRT facilitated post-activity discussion reviewing team processes and necessary communication skills involved in completion. Patients were encouraged to reflect how the skills utilized, or not utilized, in this activity can be incorporated to positively impact support systems post discharge.   Affect/Mood: N/A   Participation Level: Did not attend    Clinical Observations/Individualized Feedback: Pt attended and participated in group session.    Plan: Continue to engage patient in RT group sessions 2-3x/week.   Carime Dinkel-McCall, LRT,CTRS  05/19/2022 10:37 AM

## 2022-05-19 NOTE — Plan of Care (Signed)
Patient was able to identify coping skills to help better manage time at completion of recreation therapy group sessions.   Kalista Laguardia-McCall, LRT,CTRS

## 2022-05-19 NOTE — Progress Notes (Signed)
Recreation Therapy Notes  INPATIENT RECREATION TR PLAN  Patient Details Name: Lori [name] MRN: [id number] DOB: [birth year] Today's Date: 05/19/2022  Rec Therapy Plan Is patient appropriate for Therapeutic Recreation?: Yes Treatment times per week: about 3 days Estimated Length of Stay: 5-7 days TR Treatment/Interventions: Group participation (Comment)  Discharge Criteria Pt will be discharged from therapy if:: Discharged Treatment plan/goals/alternatives discussed and agreed upon by:: Patient/family  Discharge Summary Short term goals set: See patient care plan Short term goals met: Adequate for discharge Progress toward goals comments: Groups attended Which groups?: Wellness, Coping skills Reason goals not met: Pt attended two recreation therapy group sessions. Therapeutic equipment acquired: N/A Reason patient discharged from therapy: Discharge from hospital Pt/family agrees with progress & goals achieved: Yes Date patient discharged from therapy: 05/19/22   Curstin Schmale-McCall, LRT,CTRS Radonna Bracher A Annelise Mccoy-McCall 05/19/2022, 12:17 PM

## 2022-05-19 NOTE — Progress Notes (Signed)
[name] D/C'd Home per MD order.  Discussed with the patient and all questions fully answered.  An After Visit Summary was printed and given to the patient. Patient received prescription to pick her medication up in the pharmacy.  D/c education completed with patient including follow up instructions, medication list, d/c activities limitations if indicated, with other d/c instructions as indicated by MD - patient able to verbalize understanding, all questions fully answered.   Patient instructed to return to ED, call 911, or call MD for any changes in condition.   Patient escorted to the main entrance, and D/C home via private auto.  Melvenia Needles 05/19/2022 12:22 PM

## 2022-05-19 NOTE — Plan of Care (Signed)
  Problem: Education: Goal: Knowledge of [facility] Education information/materials will improve Outcome: Completed/Met Goal: Emotional status will improve Outcome: Completed/Met Goal: Mental status will improve Outcome: Completed/Met Goal: Verbalization of understanding the information provided will improve Outcome: Completed/Met   Problem: Activity: Goal: Interest or engagement in activities will improve Outcome: Completed/Met Goal: Sleeping patterns will improve Outcome: Completed/Met   Problem: Safety: Goal: Periods of time without injury will increase Outcome: Completed/Met   Problem: Education: Goal: Utilization of techniques to improve thought processes will improve Outcome: Completed/Met Goal: Knowledge of the prescribed therapeutic regimen will improve Outcome: Completed/Met

## 2022-05-19 NOTE — Discharge Summary (Signed)
Physician Discharge Summary Note  Patient:  Lori [name] is an 51 y.o., female MRN:  [id number] DOB:  1971/04/26 Patient phone:  [phone] (home)  Patient address:   [address] [place] [address],  Total Time spent with patient: 15 minutes  Date of Admission:  05/16/2022 Date of Discharge: 05/19/22   Reason for Admission:   Lori [name] is a 51 year old female with a psychiatric history of bipolar disorder, schizophrenia, depression, and substance use disorder who presented under IVC from [facility] ED for HI toward parents in the setting of substance use.     Principal Problem: Bipolar disorder, unspecified ([place]) Discharge Diagnoses: Principal Problem:   Bipolar disorder, unspecified ([place]) Active Problems:   Human immunodeficiency virus (HIV) disease ([place])   TOBACCO ABUSE   Essential hypertension   Cocaine abuse ([place])   Alcohol abuse   Opioid abuse ([place])   Cannabis use disorder   Chronic post-traumatic stress disorder (PTSD)  Past Psychiatric Hx: Previous Psych Diagnoses: See HPI; as well, has a history of "postpartum mania" that lasted approximately 6 months after the birth of her son. Prior inpatient treatment: Reports being admitted to behavioral health Hospital in [place] 3 years ago for psychosis, information not seen in the chart  Prior outpatient treatment: [facility] in the 1990s Psychotherapy hx: Denies History of suicide: 2 previous attempts via OD, 2018 on Tylenol 3 and in October 2023 on lisinopril History of homicide: Denies Psychiatric medication history: Celexa, Xanax, Neurontin Psychiatric medication compliance history: Reports compliance, but did not "give Celexa a fair chance" Neuromodulation history: Denies Current Psychiatrist: Denies Current therapist: Denies  Past Medical History:  Past Medical History:  Diagnosis Date   Abnormal Pap smear    Had Cryo to treat   Allergy    Anemia    Anxiety    Cluster headaches     HIV infection ([place])    Lung nodule    Vaginal venereal warts     Past Surgical History:  Procedure Laterality Date   ABDOMINAL HYSTERECTOMY     KNEE ARTHROSCOPY Left    bakers cyst removal   TUBAL LIGATION     Family History:  Family History  Problem Relation Age of Onset   CAD Mother        MI 2016   Uterine cancer Mother    Cervical cancer Mother    Colon cancer Mother    Lung cancer Maternal Grandmother    Cancer Maternal Grandfather        larynx   Prostate cancer Maternal Grandfather    Prostate cancer Father    Breast cancer Neg Hx    Liver disease Neg Hx    Esophageal cancer Neg Hx    Stomach cancer Neg Hx    Colon polyps Neg Hx    Family Psychiatric  History:  Medical: Brother-stroke Psych:  brother diagnosed with bipolar disorder and bipolar disorder being prevalence on the maternal side of her family  Psych Rx: Believes her brother takes Zyprexa SA/HA: Denies Substance use family hx: 2 maternal uncles, but unsure of what substances Social History:  Social History   Substance and Sexual Activity  Alcohol Use Yes   Alcohol/week: 0.0 standard drinks of alcohol   Comment: "a lot every day"      Social History   Substance and Sexual Activity  Drug Use Yes   Frequency: 7.0 times per week   Types: Marijuana, Cocaine   Comment: not currently doing cocaine= Marijuana  Social History   Socioeconomic History   Marital status: Single    Spouse name: Not on file   Number of children: Not on file   Years of education: Not on file   Highest education level: Not on file  Occupational History   Not on file  Tobacco Use   Smoking status: Every Day    Packs/day: 1.00    Years: 27.00    Total pack years: 27.00    Types: Cigarettes   Smokeless tobacco: Never   Tobacco comments:    patient not ready to quit; considering patches  Vaping Use   Vaping Use: Never used  Substance and Sexual Activity   Alcohol use: Yes    Alcohol/week: 0.0 standard  drinks of alcohol    Comment: "a lot every day"    Drug use: Yes    Frequency: 7.0 times per week    Types: Marijuana, Cocaine    Comment: not currently doing cocaine= Marijuana   Sexual activity: Yes    Birth control/protection: Surgical, Condom    Comment: pt. declined condoms  Other Topics Concern   Not on file  Social History Narrative   Not on file   Social Determinants of Health   Financial Resource Strain: Low Risk  (02/04/2020)   Overall Financial Resource Strain (CARDIA)    Difficulty of Paying Living Expenses: Not very hard  Food Insecurity: No Food Insecurity (05/16/2022)   Hunger Vital Sign    Worried About Running Out of Food in the Last Year: Never true    Ran Out of Food in the Last Year: Never true  Transportation Needs: Unmet Transportation Needs (05/16/2022)   PRAPARE - Transportation    Lack of Transportation (Medical): Yes    Lack of Transportation (Non-Medical): Yes  Physical Activity: Inactive (02/04/2020)   Exercise Vital Sign    Days of Exercise per Week: 0 days    Minutes of Exercise per Session: 0 min  Stress: Stress Concern Present (02/04/2020)   [place]    Feeling of Stress : Very much  Social Connections: Moderately Isolated (02/04/2020)   Social Connection and Isolation Panel [NHANES]    Frequency of Communication with Friends and Family: More than three times a week    Frequency of Social Gatherings with Friends and Family: Never    Attends Religious Services: 1 to 4 times per year    Active Member of [employer] or Organizations: No    Attends Archivist Meetings: Never    Marital Status: Never married    Hospital Course:   During the patient's hospitalization, patient had extensive initial psychiatric evaluation, and follow-up psychiatric evaluations every day.   Psychiatric diagnoses provided upon initial assessment:  Bipolar Disorder, unspecified, most recent episode  depressed Tobacco Abuse Essential hypertension Cocaine abuse ([place]) Alcohol abuse Opioid abuse ([place]) Cannabis use disorder Chronic post-traumatic stress disorder (PTSD)    Patient's psychiatric medications were adjusted on admission:  Started Abilify 5 mg QD on admission for mood stabilization, then increased to 10 mg daily Started clonidine 0.1 mg nightly on admission for nightmares and trauma related anxiety.     During the hospitalization, other adjustments were made to the patient's psychiatric medication regimen: None   Patient's care was discussed during the interdisciplinary team meeting every day during the hospitalization.   The patient denies having side effects to prescribed psychiatric medication.   Gradually, patient started adjusting to milieu. The patient was evaluated each  day by a clinical provider to ascertain response to treatment. Improvement was noted by the patient's report of decreasing symptoms, improved sleep and appetite, affect, medication tolerance, behavior, and participation in unit programming.  Patient was asked each day to complete a self inventory noting mood, mental status, pain, new symptoms, anxiety and concerns.     Symptoms were reported as significantly decreased or resolved completely by discharge.    On day of discharge, the patient reports that their mood is stable. The patient denied having suicidal thoughts for more than 48 hours prior to discharge.  Patient denies having homicidal thoughts.  Patient denies having auditory hallucinations.  Patient denies any visual hallucinations or other symptoms of psychosis. The patient was motivated to continue taking medication with a goal of continued improvement in mental health.    The patient reports their target psychiatric symptoms of depression and homicidal ideation all responded well to the psychiatric medications, and the patient reports overall benefit other psychiatric hospitalization. Supportive  psychotherapy was provided to the patient. The patient also participated in regular group therapy while hospitalized. Coping skills, problem solving as well as relaxation therapies were also part of the unit programming.   Labs were reviewed with the patient, and abnormal results were discussed with the patient.   The patient is able to verbalize their individual safety plan to this provider.   # It is recommended to the patient to continue psychiatric medications as prescribed, after discharge from the hospital.     # It is recommended to the patient to follow up with your outpatient psychiatric provider and PCP.   # It was discussed with the patient, the impact of alcohol, drugs, tobacco have been there overall psychiatric and medical wellbeing, and total abstinence from substance use was recommended the patient.ed.   # Prescriptions provided or sent directly to preferred pharmacy at discharge. Patient agreeable to plan. Given opportunity to ask questions. Appears to feel comfortable with discharge.    # In the event of worsening symptoms, the patient is instructed to call the crisis hotline, 911 and or go to the nearest ED for appropriate evaluation and treatment of symptoms. To follow-up with primary care provider for other medical issues, concerns and or health care needs   # Patient was discharged Home on 05/19/22  with a plan to follow up as noted below.    Physical Findings: AIMS:  , ,  ,  ,    CIWA:  CIWA-Ar Total: 3 COWS:     Musculoskeletal: Strength & Muscle Tone: within normal limits Gait & Station: normal Patient leans: N/A   Psychiatric Specialty Exam:   Presentation  General Appearance:  Casual; Fairly Groomed   Eye Contact: Good   Speech: Clear and Coherent   Speech Volume: Normal   Handedness: Right     Mood and Affect  Mood: "Good but nervous about discharge today"   Affect: Appropriate; Congruent     Thought Process  Thought  Processes: Coherent; Linear   Descriptions of Associations:Intact   Orientation:Full (Time, Place and Person)   Thought Content:Logical   History of Schizophrenia/Schizoaffective disorder:No data recorded Duration of Psychotic Symptoms:No data recorded Hallucinations:Hallucinations: None   Ideas of Reference:None   Suicidal Thoughts:Suicidal Thoughts: No   Homicidal Thoughts:Homicidal Thoughts: No     Sensorium  Memory: Immediate Good; Recent Good; Remote Fair   Judgment: Good   Insight: Good     Executive Functions  Concentration: Good   Attention Span: Good   Recall:  Good   Fund of Knowledge: Good   Language: Good     Psychomotor Activity  Psychomotor Activity:Psychomotor Activity: Normal     Assets  Assets: Communication Skills; Desire for Improvement; Housing; Resilience; Social Support     Sleep  Sleep:Sleep: Good       Physical Exam: Physical Exam Constitutional:      General: She is not in acute distress.    Appearance: Normal appearance.  Pulmonary:     Effort: Pulmonary effort is normal.  Neurological:     General: No focal deficit present.     Mental Status: She is alert and oriented to person, place, and time.     Cranial Nerves: No cranial nerve deficit.  Psychiatric:        Mood and Affect: Mood normal.        Behavior: Behavior normal.        Thought Content: Thought content normal.        Judgment: Judgment normal.      Review of Systems  Gastrointestinal:  Negative for constipation.  Neurological:  Negative for headaches.  Psychiatric/Behavioral:  Negative for depression, hallucinations, memory loss, substance abuse and suicidal ideas. The patient is not nervous/anxious and does not have insomnia.    Blood pressure (!) 151/101, pulse (!) 58, temperature 97.8 F (36.6 C), temperature source Oral, resp. rate 16, height _0  (1.626 m), weight 86.6 kg, SpO2 97 %. Body mass index is 32.79 kg/m.   Social History    Tobacco Use  Smoking Status Every Day   Packs/day: 1.00   Years: 27.00   Total pack years: 27.00   Types: Cigarettes  Smokeless Tobacco Never  Tobacco Comments   patient not ready to quit; considering patches   Tobacco Cessation:  Patient requested nicotine patches at discharge, will contact inpatient pharmacy for free limited supply of nicotine patches.    Blood Alcohol level:  Lab Results  Component Value Date   ETH <10 05/15/2022   ETH 133 (H) 04/07/8526    Metabolic Disorder Labs:  Lab Results  Component Value Date   HGBA1C 5.6 05/17/2022   MPG 114 05/17/2022   No results found for: "PROLACTIN" Lab Results  Component Value Date   CHOL 195 05/17/2022   TRIG 182 (H) 05/17/2022   HDL 38 (L) 05/17/2022   CHOLHDL 5.1 05/17/2022   VLDL 36 05/17/2022   LDLCALC 121 (H) 05/17/2022   LDLCALC 172 (H) 10/30/2020    See Psychiatric Specialty Exam and Suicide Risk Assessment completed by Attending Physician prior to discharge.  Discharge destination:  Home  Is patient on multiple antipsychotic therapies at discharge:  No   Has Patient had three or more failed trials of antipsychotic monotherapy by history:  No  Recommended Plan for Multiple Antipsychotic Therapies: NA   Allergies as of 05/19/2022       Reactions   Doxycycline Swelling   Other Swelling   Mulberry fruit    Sulfonamide Derivatives Other (See Comments)   Childhood allergy.        Medication List     STOP taking these medications    acetaminophen 325 MG tablet Commonly known as: TYLENOL   citalopram 10 MG tablet Commonly known as: CELEXA   ibuprofen 200 MG tablet Commonly known as: ADVIL   lisinopril 5 MG tablet Commonly known as: Zestril       TAKE these medications      Indication  ARIPiprazole 10 MG tablet Commonly known as: ABILIFY Take 1  tablet (10 mg total) by mouth daily.  Indication: psychosis   Biktarvy 50-200-25 MG Tabs tablet Generic drug:  bictegravir-emtricitabine-tenofovir AF TAKE 1 TABLET BY MOUTH DAILY.  Indication: HIV Disease   cloNIDine 0.1 MG tablet Commonly known as: CATAPRES Take 1 tablet (0.1 mg total) by mouth at bedtime.  Indication: High Blood Pressure Disorder   docusate sodium 100 MG capsule Commonly known as: COLACE Take 1 capsule (100 mg total) by mouth 2 (two) times daily.  Indication: Constipation   fluticasone 50 MCG/ACT nasal spray Commonly known as: Flonase Place 2 sprays into both nostrils daily.  Indication: Allergic Rhinitis   hydrOXYzine 25 MG tablet Commonly known as: ATARAX Take 1 tablet (25 mg total) by mouth 3 (three) times daily as needed for anxiety.  Indication: Feeling Anxious   multivitamin with minerals Tabs tablet Take 1 tablet by mouth daily.  Indication: vitamin   Muscle Rub 10-15 % Crea Apply 1 Application topically as needed for muscle pain.  Indication: pain   nicotine 21 mg/24hr patch Commonly known as: NICODERM CQ - dosed in mg/24 hours Place 1 patch (21 mg total) onto the skin daily.  Indication: Nicotine Addiction   traZODone 50 MG tablet Commonly known as: DESYREL Take 1 tablet (50 mg total) by mouth at bedtime as needed for sleep.  Indication: Trouble Sleeping         Follow-up Information     [facility]. Call.   Why: This provider is not accepting new patients or inactive patients until into 2024.  You may call to check on the status and possibly schedule appointments to re-establish for medication management services and for therapy services with Pushpinder Gill. Contact information: [address] [place] [address] [phone]         Services, [facility]. Go on 05/20/2022.   Why: You have a hospital follow up appointment, to obtain therapy and medication management services on 05/20/22 at 9:00 am. * Temporary Address and Phone number:  * [address] [place], [place].  # [phone].  Please bring  photo ID, all insurance cards, and proof of any income. Contact information: [place] [address] [phone]                 Follow-up recommendations:  Activity: as tolerated   Diet: heart healthy   Discharge Instructions  -Follow-up with your outpatient psychiatric provider -instructions on appointment date, time, and address (location) are provided to you in discharge paperwork.  -Take your psychiatric medications as prescribed at discharge - instructions are provided to you in the discharge paperwork  -Follow-up with outpatient primary care doctor and other specialists -for management of preventative medicine and any chronic medical disease. Please follow-up with the prescriber of your lisinopril for further HTN management. Take your BP twice daily, and if the results are over: diastolic over 354 or systolic over 562, then call your doctor, go to urgent care or ED.  Lisinopril was initially held due to AKI (pt not eating/drinking on cocaine) but then restarted before dc for management of HTN.   HIV Continue home Biktarvy  -Recommend abstinence from alcohol, tobacco, and other illicit drug use at discharge.   -If your psychiatric symptoms recur, worsen, or if you have side effects to your psychiatric medications, call your outpatient psychiatric provider, 911, 988 or go to the nearest emergency department.  -If suicidal thoughts occur, call your outpatient psychiatric provider, 911, 988 or go to the nearest emergency  department.  Naloxone (Narcan) can help reverse an overdose when given to the victim quickly.  [facility] offers free naloxone kits and instructions/training on its use.  Add naloxone to your first aid kit and you can help save a life.   Pick up your free kit at the following locations:   [place]:  [place], [place] [address] [phone]) [facility] [place] [address] [phone]) [facility] [place]  [place]: [place] [address] [place] [address] [phone]) [facility] [place] [address] [phone])   Signed: Christene Slates, MD 05/19/2022, 9:27 AM

## 2022-05-19 NOTE — Progress Notes (Signed)
[facility] LCSW Progress Note   05/19/2022    9:41 AM  Silvestre Mesi J Westerfeld      Type of Note: Medicare Notice  Patient informed of right to appeal discharge, provided phone number to [facility]. Patient expressed no interest in appealing discharge at this time. CSW will continue to monitor situation.     Signed:   Jacob Moores, MSW, [facility] 05/19/2022 9:41 AM

## 2022-05-19 NOTE — Discharge Instructions (Addendum)
-  Follow-up with your outpatient psychiatric provider -instructions on appointment date, time, and address (location) are provided to you in discharge paperwork.  -Take your psychiatric medications as prescribed at discharge - instructions are provided to you in the discharge paperwork  -Follow-up with outpatient primary care doctor and other specialists -for management of preventative medicine and any chronic medical disease. Please follow-up with the prescriber of your lisinopril for further HTN management. Take your BP twice daily, and if the results are over: diastolic over 336 or systolic over 122, then call your doctor, go to urgent care or ED.  Lisinopril was initially held due to AKI (pt not eating/drinking on cocaine) but then restarted before dc for management of HTN.   -Recommend abstinence from alcohol, tobacco, and other illicit drug use at discharge.   -If your psychiatric symptoms recur, worsen, or if you have side effects to your psychiatric medications, call your outpatient psychiatric provider, 911, 988 or go to the nearest emergency department.  -If suicidal thoughts occur, call your outpatient psychiatric provider, 911, 988 or go to the nearest emergency department.    Naloxone (Narcan) can help reverse an overdose when given to the victim quickly.  [facility] offers free naloxone kits and instructions/training on its use.  Add naloxone to your first aid kit and you can help save a life.   Pick up your free kit at the following locations:   [place]:  [place], [place] [address] [phone]) [facility] [place] [address] [phone]) [facility] [place]  [place]: [place] [address] [place] [address] [phone]) [facility] [place] [address] [phone])

## 2022-05-19 NOTE — Progress Notes (Signed)
  [facility] Adult Case Management Discharge Plan :  Will you be returning to the same living situation after discharge:  Yes,  Patient will be [facility] back to [facility]  At discharge, do you have transportation home?: Yes,  Patient mom will be picking her her up at 11:00 AM  Do you have the ability to pay for your medications: Yes,  Patient has Medicare Part A and B   Release of information consent forms completed and in the chart;  Patient's signature needed at discharge.  Patient to Follow up at:  Follow-up Information     [facility] Call.   Why: This provider is not accepting new patients or inactive patients until into 2024.  You may call to check on the status and possibly schedule appointments to re-establish for medication management services and for therapy services with Pushpinder Gill. Contact information: [address] [place] [address] [phone]         Services, [facility]. Go on 05/20/2022.   Why: You have a hospital follow up appointment, to obtain therapy and medication management services on 05/20/22 at 9:00 am. * Temporary Address and Phone number:  * [address] [place], [place].  # [phone].  Please bring photo ID, all insurance cards, and proof of any income. UPDATE:address is [address] [place], [place], [address] Contact information: [address] [place] [address] [phone]                 Next level of care provider has access to [facility] Link:no  Safety Planning and Suicide Prevention discussed: Yes,  mother Davene Jobin [phone]     Has patient been referred to the Quitline?: Yes, faxed on 05/19/2022  Patient has been referred for addiction treatment: N/A  Isabella Bowens, LCSWA 05/19/2022, 9:57 AM

## 2022-05-20 ENCOUNTER — Other Ambulatory Visit (HOSPITAL_COMMUNITY): Payer: Self-pay

## 2022-06-04 ENCOUNTER — Other Ambulatory Visit: Payer: Self-pay | Admitting: Infectious Diseases

## 2022-06-04 ENCOUNTER — Other Ambulatory Visit: Payer: Self-pay

## 2022-06-04 ENCOUNTER — Other Ambulatory Visit (HOSPITAL_COMMUNITY): Payer: Self-pay

## 2022-06-04 DIAGNOSIS — B20 Human immunodeficiency virus [HIV] disease: Secondary | ICD-10-CM

## 2022-06-04 MED ORDER — BIKTARVY 50-200-25 MG PO TABS
1.0000 | ORAL_TABLET | Freq: Every day | ORAL | 1 refills | Status: DC
  Filled 2022-06-04 – 2022-07-20 (×5): qty 30, fill #0
  Filled 2022-07-20: qty 30, 30d supply, fill #0
  Filled 2022-08-20: qty 30, 30d supply, fill #1

## 2022-06-14 ENCOUNTER — Other Ambulatory Visit (HOSPITAL_COMMUNITY): Payer: Self-pay

## 2022-06-14 ENCOUNTER — Other Ambulatory Visit: Payer: Self-pay

## 2022-06-15 ENCOUNTER — Telehealth (HOSPITAL_COMMUNITY): Payer: Self-pay | Admitting: *Deleted

## 2022-06-15 ENCOUNTER — Other Ambulatory Visit (HOSPITAL_COMMUNITY): Payer: Self-pay

## 2022-06-15 NOTE — Telephone Encounter (Signed)
Faxed request for Trazodone received from [facility]. Patient has no future appointments scheduled. Notified pharmacy that he has not been seen at this location.

## 2022-06-25 ENCOUNTER — Other Ambulatory Visit (HOSPITAL_COMMUNITY): Payer: Self-pay

## 2022-07-02 ENCOUNTER — Other Ambulatory Visit (HOSPITAL_COMMUNITY): Payer: Self-pay

## 2022-07-20 ENCOUNTER — Other Ambulatory Visit (HOSPITAL_COMMUNITY): Payer: Self-pay

## 2022-07-20 ENCOUNTER — Other Ambulatory Visit: Payer: Self-pay

## 2022-07-21 ENCOUNTER — Other Ambulatory Visit (HOSPITAL_COMMUNITY): Payer: Self-pay

## 2022-07-21 ENCOUNTER — Other Ambulatory Visit: Payer: Self-pay

## 2022-08-03 ENCOUNTER — Other Ambulatory Visit (HOSPITAL_COMMUNITY): Payer: Self-pay

## 2022-08-19 ENCOUNTER — Encounter: Payer: Self-pay | Admitting: Emergency Medicine

## 2022-08-19 ENCOUNTER — Ambulatory Visit
Admission: EM | Admit: 2022-08-19 | Discharge: 2022-08-19 | Disposition: A | Discharge status: Home / Self Care | Payer: Medicare Other | Attending: Nurse Practitioner | Admitting: Nurse Practitioner

## 2022-08-19 ENCOUNTER — Other Ambulatory Visit: Payer: Self-pay

## 2022-08-19 DIAGNOSIS — B9689 Other specified bacterial agents as the cause of diseases classified elsewhere: Secondary | ICD-10-CM | POA: Insufficient documentation

## 2022-08-19 DIAGNOSIS — N3000 Acute cystitis without hematuria: Secondary | ICD-10-CM | POA: Insufficient documentation

## 2022-08-19 DIAGNOSIS — J019 Acute sinusitis, unspecified: Secondary | ICD-10-CM | POA: Diagnosis not present

## 2022-08-19 LAB — POCT URINALYSIS DIP (MANUAL ENTRY)
Blood, UA: NEGATIVE
Glucose, UA: NEGATIVE mg/dL
Nitrite, UA: POSITIVE — AB
Protein Ur, POC: 100 mg/dL — AB
Spec Grav, UA: 1.025 (ref 1.010–1.025)
Urobilinogen, UA: 0.2 E.U./dL
pH, UA: 6 (ref 5.0–8.0)

## 2022-08-19 MED ORDER — AMOXICILLIN-POT CLAVULANATE 875-125 MG PO TABS: 1.0000 | ORAL_TABLET | Freq: Two times a day (BID) | ORAL | 0 refills | Status: AC

## 2022-08-19 NOTE — ED Provider Notes (Signed)
[facility]    CSN: [id number] Arrival date & time: 08/19/22  1001      History   Chief Complaint Chief Complaint  Patient presents with   Nasal Congestion    HPI Lori [name] is a 52 y.o. female.   Patient presents today for 2-week history of dysuria, urinary urgency, urinary incontinence, strong urinary odor, suprapubic pressure, and subjective fever that she describes as sweating, cold and "cannot get warm."  And chills.  No urinary frequency, hematuria, abdominal pain, flank pain or new back pain, nausea/vomiting, or vaginal discharge.  Reports she is currently sexually active and urinates after sex activity.  Has not take anything for symptoms so far, however has tried drinking more water which has not helped.  No new back injury.  Reports urinary incontinence was 1 time yesterday, she has been able to hold her urine since.  She is also concerned about more than 1 week history of cough.  She is also having a dry cough, runny and stuffy nose, postnasal drainage, sinus pressure in her cheeks and above her eyes, and headache.  No sore throat, shortness of breath or chest pain, wheezing, chest tightness, abdominal pain, nausea/vomiting, or diarrhea.  Reports her appetite has been normal.  No loss of taste or smell.  Reports she has been more tired than normal.  Has not taken anything for symptoms so far.    Past Medical History:  Diagnosis Date   Abnormal Pap smear    Had Cryo to treat   Allergy    Anemia    Anxiety    Cluster headaches    HIV infection ([place])    Lung nodule    Vaginal venereal warts     Patient Active Problem List   Diagnosis Date Noted   Bipolar disorder, unspecified ([place]) 05/16/2022   Opioid abuse ([place]) 05/16/2022   Cannabis use disorder 05/16/2022   Chronic post-traumatic stress disorder (PTSD) 05/16/2022   Rectal prolapse 11/27/2020   Alcohol abuse with alcohol-induced mood disorder ([place]) 11/14/2020   Tick bite 10/30/2020    Alcohol abuse 12/18/2019   Lung nodule 11/16/2017   Cocaine abuse ([place]) 11/16/2017   Syphilis 07/18/2017   Essential hypertension 07/22/2014   Migraine 10/31/2006   Human immunodeficiency virus (HIV) disease ([place]) 06/27/2006   ANXIETY DISORDER, GENERALIZED 06/27/2006   TOBACCO ABUSE 06/27/2006    Past Surgical History:  Procedure Laterality Date   ABDOMINAL HYSTERECTOMY     KNEE ARTHROSCOPY Left    bakers cyst removal   TUBAL LIGATION      OB History     Gravida  7   Para  2   Term  2   Preterm      AB  5   Living  2      SAB  2   IAB  3   Ectopic      Multiple      Live Births               Home Medications    Prior to Admission medications   Medication Sig Start Date End Date Taking? Authorizing Provider  amoxicillin-clavulanate (AUGMENTIN) 875-125 MG tablet Take 1 tablet by mouth 2 (two) times daily for 7 days. 08/19/22 08/26/22 Yes Noemi Chapel A, NP  ARIPiprazole (ABILIFY) 10 MG tablet Take 1 tablet (10 mg total) by mouth daily. 05/19/22 06/18/22  Massengill, Ovid Curd, MD  bictegravir-emtricitabine-tenofovir AF (BIKTARVY) 50-200-25 MG TABS tablet Take 1 tablet by mouth daily. 06/04/22  Campbell Riches, MD  cloNIDine (CATAPRES) 0.1 MG tablet Take 1 tablet (0.1 mg total) by mouth at bedtime. 05/18/22 06/18/22  Massengill, Ovid Curd, MD  docusate sodium (COLACE) 100 MG capsule Take 1 capsule (100 mg total) by mouth 2 (two) times daily. 05/18/22   Massengill, Ovid Curd, MD  fluticasone (FLONASE) 50 MCG/ACT nasal spray Place 2 sprays into both nostrils daily. 10/30/20   Campbell Riches, MD  lisinopril (ZESTRIL) 5 MG tablet Take 1 tablet (5 mg total) by mouth daily. 05/19/22 07/04/22  Massengill, Ovid Curd, MD  Menthol-Methyl Salicylate (MUSCLE RUB) 10-15 % CREA Apply 1 Application topically as needed for muscle pain. 05/18/22   Massengill, Ovid Curd, MD  Multiple Vitamin (MULTIVITAMIN WITH MINERALS) TABS tablet Take 1 tablet by mouth daily. 05/19/22   Massengill, Ovid Curd,  MD  nicotine (NICODERM CQ - DOSED IN MG/24 HOURS) 21 mg/24hr patch Place 1 patch (21 mg total) onto the skin daily. 05/19/22   Massengill, Ovid Curd, MD  traZODone (DESYREL) 50 MG tablet Take 1 tablet (50 mg total) by mouth at bedtime as needed for sleep. 05/18/22 06/18/22  Massengill, Ovid Curd, MD  diphenhydramine-acetaminophen (TYLENOL PM) 25-500 MG TABS tablet Take 1 tablet by mouth at bedtime as needed (sleep).  08/23/19  [provider]    Family History Family History  Problem Relation Age of Onset   CAD Mother        MI 2016   Uterine cancer Mother    Cervical cancer Mother    Colon cancer Mother    Lung cancer Maternal Grandmother    Cancer Maternal Grandfather        larynx   Prostate cancer Maternal Grandfather    Prostate cancer Father    Breast cancer Neg Hx    Liver disease Neg Hx    Esophageal cancer Neg Hx    Stomach cancer Neg Hx    Colon polyps Neg Hx     Social History Social History   Tobacco Use   Smoking status: Every Day    Packs/day: 1.00    Years: 27.00    Total pack years: 27.00    Types: Cigarettes   Smokeless tobacco: Never   Tobacco comments:    patient not ready to quit; considering patches  Vaping Use   Vaping Use: Never used  Substance Use Topics   Alcohol use: Yes    Alcohol/week: 0.0 standard drinks of alcohol    Comment: "a lot every day"    Drug use: Yes    Frequency: 7.0 times per week    Types: Marijuana, Cocaine    Comment: marijuana daily, cocaine as needed     Allergies   Doxycycline, Other, and Sulfonamide derivatives   Review of Systems Review of Systems Per HPI  Physical Exam Triage Vital Signs ED Triage Vitals  Enc Vitals Group     BP 08/19/22 1105 119/86     Pulse Rate 08/19/22 1105 69     Resp 08/19/22 1105 20     Temp 08/19/22 1105 98.9 F (37.2 C)     Temp Source 08/19/22 1105 Oral     SpO2 08/19/22 1105 94 %     Weight --      Height --      Head Circumference --      Peak Flow --      Pain Score  08/19/22 1102 0     Pain Loc --      Pain Edu? --      Excl. in  GC? --    No data found.  Updated Vital Signs BP 119/86 (BP Location: Right Arm)   Pulse 69   Temp 98.9 F (37.2 C) (Oral)   Resp 20   SpO2 94%   Visual Acuity Right Eye Distance:   Left Eye Distance:   Bilateral Distance:    Right Eye Near:   Left Eye Near:    Bilateral Near:     Physical Exam Vitals and nursing note reviewed.  Constitutional:      General: She is not in acute distress.    Appearance: Normal appearance. She is not ill-appearing or toxic-appearing.  HENT:     Head: Normocephalic and atraumatic.     Right Ear: Tympanic membrane, ear canal and external ear normal.     Left Ear: Tympanic membrane, ear canal and external ear normal.     Nose: Congestion and rhinorrhea present.     Mouth/Throat:     Mouth: Mucous membranes are moist.     Pharynx: Oropharynx is clear. Posterior oropharyngeal erythema present. No oropharyngeal exudate.  Eyes:     General: No scleral icterus.    Extraocular Movements: Extraocular movements intact.  Cardiovascular:     Rate and Rhythm: Normal rate and regular rhythm.  Pulmonary:     Effort: Pulmonary effort is normal. No respiratory distress.     Breath sounds: Normal breath sounds. No wheezing, rhonchi or rales.  Abdominal:     General: Abdomen is flat. Bowel sounds are normal. There is no distension.     Palpations: Abdomen is soft.     Tenderness: There is no right CVA tenderness or left CVA tenderness.  Musculoskeletal:     Cervical back: Normal range of motion and neck supple.  Lymphadenopathy:     Cervical: No cervical adenopathy.  Skin:    General: Skin is warm and dry.     Coloration: Skin is not jaundiced or pale.     Findings: No erythema or rash.  Neurological:     Mental Status: She is alert and oriented to person, place, and time.  Psychiatric:        Behavior: Behavior is cooperative.      UC Treatments / Results  Labs (all labs  ordered are listed, but only abnormal results are displayed) Labs Reviewed  POCT URINALYSIS DIP (MANUAL ENTRY) - Abnormal; Notable for the following components:      Result Value   Color, UA orange (*)    Clarity, UA hazy (*)    Bilirubin, UA small (*)    Ketones, POC UA trace (5) (*)    Protein Ur, POC =100 (*)    Nitrite, UA Positive (*)    Leukocytes, UA Small (1+) (*)    All other components within normal limits  URINE CULTURE    EKG   Radiology No results found.  Procedures Procedures (including critical care time)  Medications Ordered in UC Medications - No data to display  Initial Impression / Assessment and Plan / UC Course  I have reviewed the triage vital signs and the nursing notes.  Pertinent labs & imaging results that were available during my care of the patient were reviewed by me and considered in my medical decision making (see chart for details).   Patient is well-appearing, normotensive, afebrile, not tachycardic, not tachypneic, oxygenating well on room air.   1. Acute cystitis without hematuria Urinalysis today shows ketones, protein, nitrites, leukocyte Estrace Urine culture is pending No recent urine culture data  to review Will treat patient with Augmentin which should cover for E. Coli Patient declines prescription for [facility] ER and return precautions discussed with patient  2. Acute bacterial sinusitis Given length of symptoms, will treat with Augmentin twice daily for 7 days Other supportive care discussed Patient declines prescription for [facility] ER and return precautions discussed with patient  The patient was given the opportunity to ask questions.  All questions answered to their satisfaction.  The patient is in agreement to this plan.   Final Clinical Impressions(s) / UC Diagnoses   Final diagnoses:  Acute cystitis without hematuria  Acute bacterial sinusitis   Discharge Instructions   None    ED Prescriptions      Medication Sig Dispense Auth. Provider   amoxicillin-clavulanate (AUGMENTIN) 875-125 MG tablet Take 1 tablet by mouth 2 (two) times daily for 7 days. 14 tablet Eulogio Bear, NP      PDMP not reviewed this encounter.   Eulogio Bear, NP 08/19/22 [phone]

## 2022-08-19 NOTE — ED Triage Notes (Signed)
Pt reports cough, nasal congestion,chills for last several weeks. Pt also reports increased urinary frequency but decreased output.

## 2022-08-20 ENCOUNTER — Other Ambulatory Visit (HOSPITAL_COMMUNITY): Payer: Self-pay

## 2022-08-22 LAB — URINE CULTURE: Culture: 40000 — AB

## 2022-09-14 ENCOUNTER — Other Ambulatory Visit (HOSPITAL_COMMUNITY): Payer: Self-pay

## 2022-09-17 ENCOUNTER — Other Ambulatory Visit (HOSPITAL_COMMUNITY): Payer: Self-pay

## 2022-09-20 ENCOUNTER — Other Ambulatory Visit (HOSPITAL_COMMUNITY): Payer: Self-pay

## 2022-10-01 ENCOUNTER — Other Ambulatory Visit: Payer: Self-pay | Admitting: Infectious Diseases

## 2022-10-01 ENCOUNTER — Other Ambulatory Visit (HOSPITAL_COMMUNITY): Payer: Self-pay

## 2022-10-01 DIAGNOSIS — B20 Human immunodeficiency virus [HIV] disease: Secondary | ICD-10-CM

## 2022-10-04 ENCOUNTER — Other Ambulatory Visit: Payer: Self-pay

## 2022-10-04 ENCOUNTER — Other Ambulatory Visit (HOSPITAL_COMMUNITY): Payer: Self-pay

## 2022-10-04 MED ORDER — BIKTARVY 50-200-25 MG PO TABS
1.0000 | ORAL_TABLET | Freq: Every day | ORAL | 1 refills | Status: DC
  Filled 2022-10-04: qty 30, 30d supply, fill #0
  Filled 2022-10-25: qty 30, 30d supply, fill #1

## 2022-10-21 ENCOUNTER — Other Ambulatory Visit (HOSPITAL_COMMUNITY): Payer: Self-pay

## 2022-10-25 ENCOUNTER — Other Ambulatory Visit (HOSPITAL_COMMUNITY): Payer: Self-pay

## 2022-10-26 ENCOUNTER — Other Ambulatory Visit: Payer: Self-pay

## 2022-10-29 ENCOUNTER — Other Ambulatory Visit: Payer: Self-pay

## 2022-11-01 ENCOUNTER — Emergency Department (EMERGENCY_DEPARTMENT_HOSPITAL)
Admission: EM | Admit: 2022-11-01 | Discharge: 2022-11-02 | Disposition: A | Discharge status: Other Acute Inpatient Hospital | Payer: Medicare Other | Source: Home / Self Care | Attending: Student | Admitting: Student

## 2022-11-01 ENCOUNTER — Other Ambulatory Visit: Payer: Self-pay

## 2022-11-01 ENCOUNTER — Encounter (HOSPITAL_COMMUNITY): Payer: Self-pay

## 2022-11-01 DIAGNOSIS — R519 Headache, unspecified: Secondary | ICD-10-CM | POA: Insufficient documentation

## 2022-11-01 DIAGNOSIS — M25552 Pain in left hip: Secondary | ICD-10-CM | POA: Insufficient documentation

## 2022-11-01 DIAGNOSIS — R45851 Suicidal ideations: Secondary | ICD-10-CM

## 2022-11-01 DIAGNOSIS — Z79899 Other long term (current) drug therapy: Secondary | ICD-10-CM | POA: Insufficient documentation

## 2022-11-01 DIAGNOSIS — Z21 Asymptomatic human immunodeficiency virus [HIV] infection status: Secondary | ICD-10-CM | POA: Insufficient documentation

## 2022-11-01 DIAGNOSIS — F332 Major depressive disorder, recurrent severe without psychotic features: Secondary | ICD-10-CM | POA: Diagnosis not present

## 2022-11-01 DIAGNOSIS — F1910 Other psychoactive substance abuse, uncomplicated: Secondary | ICD-10-CM | POA: Insufficient documentation

## 2022-11-01 DIAGNOSIS — F29 Unspecified psychosis not due to a substance or known physiological condition: Secondary | ICD-10-CM | POA: Insufficient documentation

## 2022-11-01 DIAGNOSIS — I10 Essential (primary) hypertension: Secondary | ICD-10-CM | POA: Diagnosis not present

## 2022-11-01 DIAGNOSIS — F319 Bipolar disorder, unspecified: Secondary | ICD-10-CM | POA: Diagnosis not present

## 2022-11-01 DIAGNOSIS — F17210 Nicotine dependence, cigarettes, uncomplicated: Secondary | ICD-10-CM | POA: Insufficient documentation

## 2022-11-01 LAB — URINALYSIS, ROUTINE W REFLEX MICROSCOPIC
Bilirubin Urine: NEGATIVE
Glucose, UA: NEGATIVE mg/dL
Hgb urine dipstick: NEGATIVE
Ketones, ur: NEGATIVE mg/dL
Leukocytes,Ua: NEGATIVE
Nitrite: NEGATIVE
Protein, ur: 30 mg/dL — AB
Specific Gravity, Urine: 1.025 (ref 1.005–1.030)
pH: 6 (ref 5.0–8.0)

## 2022-11-01 LAB — CBC WITH DIFFERENTIAL/PLATELET
Abs Immature Granulocytes: 0.03 10*3/uL (ref 0.00–0.07)
Basophils Absolute: 0 10*3/uL (ref 0.0–0.1)
Basophils Relative: 0 %
Eosinophils Absolute: 0 10*3/uL (ref 0.0–0.5)
Eosinophils Relative: 0 %
HCT: 39 % (ref 36.0–46.0)
Hemoglobin: 13.5 g/dL (ref 12.0–15.0)
Immature Granulocytes: 0 %
Lymphocytes Relative: 17 %
Lymphs Abs: 1.5 10*3/uL (ref 0.7–4.0)
MCH: 32.8 pg (ref 26.0–34.0)
MCHC: 34.6 g/dL (ref 30.0–36.0)
MCV: 94.7 fL (ref 80.0–100.0)
Monocytes Absolute: 0.4 10*3/uL (ref 0.1–1.0)
Monocytes Relative: 5 %
Neutro Abs: 7.2 10*3/uL (ref 1.7–7.7)
Neutrophils Relative %: 78 %
Platelets: 228 10*3/uL (ref 150–400)
RBC: 4.12 MIL/uL (ref 3.87–5.11)
RDW: 13.1 % (ref 11.5–15.5)
WBC: 9.2 10*3/uL (ref 4.0–10.5)
nRBC: 0 % (ref 0.0–0.2)

## 2022-11-01 LAB — COMPREHENSIVE METABOLIC PANEL
ALT: 12 U/L (ref 0–44)
AST: 17 U/L (ref 15–41)
Albumin: 4.2 g/dL (ref 3.5–5.0)
Alkaline Phosphatase: 60 U/L (ref 38–126)
Anion gap: 8 (ref 5–15)
BUN: 11 mg/dL (ref 6–20)
CO2: 26 mmol/L (ref 22–32)
Calcium: 9.2 mg/dL (ref 8.9–10.3)
Chloride: 105 mmol/L (ref 98–111)
Creatinine, Ser: 0.69 mg/dL (ref 0.44–1.00)
GFR, Estimated: >60 mL/min (ref >60)
Glucose, Bld: 100 mg/dL — ABNORMAL HIGH (ref 70–99)
Potassium: 3.3 mmol/L — ABNORMAL LOW (ref 3.5–5.1)
Sodium: 139 mmol/L (ref 135–145)
Total Bilirubin: 0.8 mg/dL (ref 0.3–1.2)
Total Protein: 6.8 g/dL (ref 6.5–8.1)

## 2022-11-01 LAB — RAPID URINE DRUG SCREEN, HOSP PERFORMED
Amphetamines: NOT DETECTED
Barbiturates: NOT DETECTED
Benzodiazepines: POSITIVE — AB
Cocaine: POSITIVE — AB
Opiates: NOT DETECTED
Tetrahydrocannabinol: POSITIVE — AB

## 2022-11-01 LAB — URINALYSIS, MICROSCOPIC (REFLEX)
Bacteria, UA: NONE SEEN
RBC / HPF: NONE SEEN RBC/hpf (ref 0–5)
Squamous Epithelial / HPF: NONE SEEN /HPF (ref 0–5)
WBC, UA: NONE SEEN WBC/hpf (ref 0–5)

## 2022-11-01 MED ORDER — KETOROLAC TROMETHAMINE 15 MG/ML IJ SOLN
15.0000 mg | Freq: Once | INTRAMUSCULAR | Status: AC
  Administered 2022-11-01: 15 mg via INTRAVENOUS
  Filled 2022-11-01: qty 1

## 2022-11-01 MED ORDER — PROCHLORPERAZINE EDISYLATE 10 MG/2ML IJ SOLN
10.0000 mg | Freq: Once | INTRAMUSCULAR | Status: AC
  Administered 2022-11-01: 10 mg via INTRAVENOUS
  Filled 2022-11-01: qty 2

## 2022-11-01 MED ORDER — DIPHENHYDRAMINE HCL 50 MG/ML IJ SOLN
25.0000 mg | Freq: Once | INTRAMUSCULAR | Status: AC
  Administered 2022-11-01: 25 mg via INTRAVENOUS
  Filled 2022-11-01: qty 1

## 2022-11-01 MED ORDER — LISINOPRIL 5 MG PO TABS
5.0000 mg | ORAL_TABLET | Freq: Every day | ORAL | Status: DC
  Administered 2022-11-01 – 2022-11-02 (×2): 5 mg via ORAL
  Filled 2022-11-01 (×2): qty 1

## 2022-11-01 MED ORDER — LACTATED RINGERS IV BOLUS
1000.0000 mL | Freq: Once | INTRAVENOUS | Status: AC
  Administered 2022-11-01: 1000 mL via INTRAVENOUS

## 2022-11-01 MED ORDER — BICTEGRAVIR-EMTRICITAB-TENOFOV 50-200-25 MG PO TABS
1.0000 | ORAL_TABLET | Freq: Every day | ORAL | Status: DC
  Administered 2022-11-01 – 2022-11-02 (×2): 1 via ORAL
  Filled 2022-11-01 (×3): qty 1

## 2022-11-01 MED ORDER — CLONIDINE HCL 0.1 MG PO TABS
0.1000 mg | ORAL_TABLET | Freq: Every day | ORAL | Status: DC
  Administered 2022-11-01: 0.1 mg via ORAL
  Filled 2022-11-01: qty 1

## 2022-11-01 NOTE — ED Triage Notes (Signed)
BIB [facility] for SI. Per EMS, pt disclosed that she has not taken her BP medication in 2-3 months and she has had a headache foe about 2 weeks. Pt A&Ox4

## 2022-11-01 NOTE — ED Provider Notes (Signed)
[place] EMERGENCY DEPARTMENT AT [facility] Provider Note  CSN: [id number] Arrival date & time: 11/01/22 4098  Chief Complaint(s) Suicidal  HPI Lori [name] is a 52 y.o. female with PMH HIV on Biktarvy, bipolar disorder, polysubstance abuse who presents emergency room for evaluation of multiple complaints including suicidal ideation and headache.  She states that she was partying last night and used crack cocaine and drink alcohol.  She also endorses headache that she has had for 2 weeks that is not responsive to Tylenol.  Also endorsing some mild left hip pain.  Today she states that she presents emergency room because she "wants to go to behavioral health" and is actively endorsing suicidal ideation.  She does not have a plan but states that if she did have access to "pills that would work" she would take them to kill herself.  She denies homicidal ideation, auditory or visual hallucinations.  She states that she has been out of her blood pressure medicines for almost 2 months but has had her Biktarvy and has not missed any doses.   Past Medical History Past Medical History:  Diagnosis Date   Abnormal Pap smear    Had Cryo to treat   Allergy    Anemia    Anxiety    Cluster headaches    HIV infection ([facility])    Lung nodule    Vaginal venereal warts    Patient Active Problem List   Diagnosis Date Noted   Bipolar disorder, unspecified ([facility]) 05/16/2022   Opioid abuse ([facility]) 05/16/2022   Cannabis use disorder 05/16/2022   Chronic post-traumatic stress disorder (PTSD) 05/16/2022   Rectal prolapse 11/27/2020   Alcohol abuse with alcohol-induced mood disorder ([facility]) 11/14/2020   Tick bite 10/30/2020   Alcohol abuse 12/18/2019   Lung nodule 11/16/2017   Cocaine abuse ([facility]) 11/16/2017   Syphilis 07/18/2017   Essential hypertension 07/22/2014   Migraine 10/31/2006   Human immunodeficiency virus (HIV) disease ([facility]) 06/27/2006   ANXIETY DISORDER, GENERALIZED 06/27/2006    TOBACCO ABUSE 06/27/2006   Home Medication(s) Prior to Admission medications   Medication Sig Start Date End Date Taking? Authorizing Provider  ARIPiprazole (ABILIFY) 10 MG tablet Take 1 tablet (10 mg total) by mouth daily. 05/19/22 06/18/22  Massengill, Harrold Donath, MD  bictegravir-emtricitabine-tenofovir AF (BIKTARVY) 50-200-25 MG TABS tablet Take 1 tablet by mouth daily. 10/04/22   [name], MD  cloNIDine (CATAPRES) 0.1 MG tablet Take 1 tablet (0.1 mg total) by mouth at bedtime. 05/18/22 06/18/22  Massengill, Harrold Donath, MD  docusate sodium (COLACE) 100 MG capsule Take 1 capsule (100 mg total) by mouth 2 (two) times daily. 05/18/22   Massengill, Harrold Donath, MD  fluticasone (FLONASE) 50 MCG/ACT nasal spray Place 2 sprays into both nostrils daily. 10/30/20   [name], MD  lisinopril (ZESTRIL) 5 MG tablet Take 1 tablet (5 mg total) by mouth daily. 05/19/22 07/04/22  Massengill, Harrold Donath, MD  Menthol-Methyl Salicylate (MUSCLE RUB) 10-15 % CREA Apply 1 Application topically as needed for muscle pain. 05/18/22   Massengill, Harrold Donath, MD  Multiple Vitamin (MULTIVITAMIN WITH MINERALS) TABS tablet Take 1 tablet by mouth daily. 05/19/22   Massengill, Harrold Donath, MD  nicotine (NICODERM CQ - DOSED IN MG/24 HOURS) 21 mg/24hr patch Place 1 patch (21 mg total) onto the skin daily. 05/19/22   Massengill, Harrold Donath, MD  traZODone (DESYREL) 50 MG tablet Take 1 tablet (50 mg total) by mouth at bedtime as needed for sleep. 05/18/22 06/18/22  Phineas Inches, MD  diphenhydramine-acetaminophen (TYLENOL  PM) 25-500 MG TABS tablet Take 1 tablet by mouth at bedtime as needed (sleep).  08/23/19  [provider]                                                                                                                                    Past Surgical History Past Surgical History:  Procedure Laterality Date   ABDOMINAL HYSTERECTOMY     KNEE ARTHROSCOPY Left    bakers cyst removal   TUBAL LIGATION     Family  History Family History  Problem Relation Age of Onset   CAD Mother        MI 2016   Uterine cancer Mother    Cervical cancer Mother    Colon cancer Mother    Lung cancer Maternal Grandmother    Cancer Maternal Grandfather        larynx   Prostate cancer Maternal Grandfather    Prostate cancer Father    Breast cancer Neg Hx    Liver disease Neg Hx    Esophageal cancer Neg Hx    Stomach cancer Neg Hx    Colon polyps Neg Hx     Social History Social History   Tobacco Use   Smoking status: Every Day    Packs/day: 1.00    Years: 27.00    Additional pack years: 0.00    Total pack years: 27.00    Types: Cigarettes   Smokeless tobacco: Never   Tobacco comments:    patient not ready to quit; considering patches  Vaping Use   Vaping Use: Never used  Substance Use Topics   Alcohol use: Yes    Alcohol/week: 0.0 standard drinks of alcohol    Comment: "a lot every day"    Drug use: Yes    Frequency: 7.0 times per week    Types: Marijuana, Cocaine    Comment: marijuana daily, cocaine as needed   Allergies Doxycycline, Other, and Sulfonamide derivatives  Review of Systems Review of Systems  Musculoskeletal:  Positive for arthralgias.  Neurological:  Positive for headaches.  Psychiatric/Behavioral:  Positive for suicidal ideas.     Physical Exam Vital Signs  I have reviewed the triage vital signs BP (!) 157/95   Pulse 65   Temp 97.9 F (36.6 C) (Oral)   Ht 5\' 4"  (1.626 m)   Wt 86.6 kg   SpO2 97%   BMI 32.77 kg/m   Physical Exam Vitals and nursing note reviewed.  Constitutional:      General: She is not in acute distress.    Appearance: She is well-developed.  HENT:     Head: Normocephalic and atraumatic.  Eyes:     Conjunctiva/sclera: Conjunctivae normal.  Cardiovascular:     Rate and Rhythm: Normal rate and regular rhythm.     Heart sounds: No murmur heard. Pulmonary:     Effort: Pulmonary effort is normal. No respiratory distress.  Breath sounds:  Normal breath sounds.  Abdominal:     Palpations: Abdomen is soft.     Tenderness: There is no abdominal tenderness.  Musculoskeletal:        General: No swelling.     Cervical back: Neck supple.  Skin:    General: Skin is warm and dry.     Capillary Refill: Capillary refill takes less than 2 seconds.  Neurological:     Mental Status: She is alert.  Psychiatric:        Mood and Affect: Mood normal.     ED Results and Treatments Labs (all labs ordered are listed, but only abnormal results are displayed) Labs Reviewed  COMPREHENSIVE METABOLIC PANEL  CBC WITH DIFFERENTIAL/PLATELET  URINALYSIS, ROUTINE W REFLEX MICROSCOPIC  RAPID URINE DRUG SCREEN, HOSP PERFORMED                                                                                                                          Radiology No results found.  Pertinent labs & imaging results that were available during my care of the patient were reviewed by me and considered in my medical decision making (see MDM for details).  Medications Ordered in ED Medications  lisinopril (ZESTRIL) tablet 5 mg (has no administration in time range)  cloNIDine (CATAPRES) tablet 0.1 mg (has no administration in time range)  prochlorperazine (COMPAZINE) injection 10 mg (has no administration in time range)  diphenhydrAMINE (BENADRYL) injection 25 mg (has no administration in time range)  lactated ringers bolus 1,000 mL (has no administration in time range)                                                                                                                                     Procedures Procedures  (including critical care time)  Medical Decision Making / ED Course   This patient presents to the ED for concern of SI, headache, this involves an extensive number of treatment options, and is a complaint that carries with it a high risk of complications and morbidity.  The differential diagnosis includes suicidal ideation, major  depression, psychosis, tension headache, withdrawal, intoxication  MDM: Patient seen emergency room for evaluation of multiple complaints described above.  Physical exam largely unremarkable with no focal motor or sensory deficits.  Laboratory evaluation with mild hypokalemia to 3.3 but is otherwise unremarkable.  UDS positive for cocaine, benzodiazepines and  THC.  Patient does endorse crack cocaine use and Valium last night.  Antiretroviral therapy administered the patient and she received a headache cocktail for her headache.  Blood pressure is improved.  At this time, patient hemodynamically stable and is medically cleared.  TTS consulted and disposition pending TTS evaluation.  Please see provider signout for continuation of workup.   Additional history obtained:  -External records from outside source obtained and reviewed including: Chart review including previous notes, labs, imaging, consultation notes   Lab Tests: -I ordered, reviewed, and interpreted labs.   The pertinent results include:   Labs Reviewed  COMPREHENSIVE METABOLIC PANEL  CBC WITH DIFFERENTIAL/PLATELET  URINALYSIS, ROUTINE W REFLEX MICROSCOPIC  RAPID URINE DRUG SCREEN, HOSP PERFORMED      Medicines ordered and prescription drug management: Meds ordered this encounter  Medications   lisinopril (ZESTRIL) tablet 5 mg   cloNIDine (CATAPRES) tablet 0.1 mg   prochlorperazine (COMPAZINE) injection 10 mg   diphenhydrAMINE (BENADRYL) injection 25 mg   lactated ringers bolus 1,000 mL    -I have reviewed the patients home medicines and have made adjustments as needed  Critical interventions none  Consultations Obtained: I requested consultation with the TTS providers,  and discussed lab and imaging findings as well as pertinent plan - they recommend: Recommendations pending   Cardiac Monitoring: The patient was maintained on a cardiac monitor.  I personally viewed and interpreted the cardiac monitored which showed  an underlying rhythm of: NSR  Social Determinants of Health:  Factors impacting patients care include: Polysubstance use   Reevaluation: After the interventions noted above, I reevaluated the patient and found that they have :improved  Co morbidities that complicate the patient evaluation  Past Medical History:  Diagnosis Date   Abnormal Pap smear    Had Cryo to treat   Allergy    Anemia    Anxiety    Cluster headaches    HIV infection (H[facility])    Lung nodule    Vaginal venereal warts       Dispostion: I considered admission for this patient, and disposition pending TTS evaluation.  Please see provider signout for continuation of workup    Final Clinical Impression(s) / ED Diagnoses Final diagnoses:  None     @PCDICTATION @    Glendora Score, MD 11/01/22 [phone]

## 2022-11-01 NOTE — BH Assessment (Signed)
This TTS consult has been deferred to [facility]. RN Marliss Czar and Dr. Suezanne Jacquet  have been made aware that the consult has been deferred.   Manfred Arch, MSW, LCSW Triage Specialist [phone]

## 2022-11-01 NOTE — ED Notes (Signed)
PT ambulated to the bathroom with sitter.

## 2022-11-01 NOTE — ED Notes (Signed)
Pt medically cleared and awaiting TTS consult. 

## 2022-11-01 NOTE — BH Assessment (Signed)
Per [facility] Coordinator Sinclair Ship, patient to be seen at 23:00 by provider Wandra Feinstein. Information relayed to RN, ED provider via secure chat.   Manfred Arch, MSW, LCSW Triage Specialist [phone]

## 2022-11-02 ENCOUNTER — Other Ambulatory Visit: Payer: Self-pay

## 2022-11-02 ENCOUNTER — Inpatient Hospital Stay (HOSPITAL_COMMUNITY)
Admission: AD | Admit: 2022-11-02 | Discharge: 2022-11-08 | DRG: 885 | Disposition: A | Discharge status: Home / Self Care | Payer: Medicare Other | Source: Intra-hospital | Attending: Psychiatry | Admitting: Psychiatry

## 2022-11-02 ENCOUNTER — Encounter (HOSPITAL_COMMUNITY): Payer: Self-pay | Admitting: Registered Nurse

## 2022-11-02 DIAGNOSIS — Z5982 Transportation insecurity: Secondary | ICD-10-CM

## 2022-11-02 DIAGNOSIS — Z9151 Personal history of suicidal behavior: Secondary | ICD-10-CM | POA: Diagnosis not present

## 2022-11-02 DIAGNOSIS — R45851 Suicidal ideations: Secondary | ICD-10-CM | POA: Diagnosis present

## 2022-11-02 DIAGNOSIS — Z881 Allergy status to other antibiotic agents status: Secondary | ICD-10-CM

## 2022-11-02 DIAGNOSIS — F1010 Alcohol abuse, uncomplicated: Secondary | ICD-10-CM | POA: Diagnosis present

## 2022-11-02 DIAGNOSIS — F332 Major depressive disorder, recurrent severe without psychotic features: Secondary | ICD-10-CM | POA: Diagnosis not present

## 2022-11-02 DIAGNOSIS — F4312 Post-traumatic stress disorder, chronic: Secondary | ICD-10-CM | POA: Diagnosis present

## 2022-11-02 DIAGNOSIS — F411 Generalized anxiety disorder: Secondary | ICD-10-CM | POA: Insufficient documentation

## 2022-11-02 DIAGNOSIS — F319 Bipolar disorder, unspecified: Secondary | ICD-10-CM | POA: Diagnosis present

## 2022-11-02 DIAGNOSIS — Z79899 Other long term (current) drug therapy: Secondary | ICD-10-CM

## 2022-11-02 DIAGNOSIS — F1290 Cannabis use, unspecified, uncomplicated: Secondary | ICD-10-CM | POA: Diagnosis present

## 2022-11-02 DIAGNOSIS — R519 Headache, unspecified: Secondary | ICD-10-CM | POA: Diagnosis present

## 2022-11-02 DIAGNOSIS — F1910 Other psychoactive substance abuse, uncomplicated: Secondary | ICD-10-CM | POA: Diagnosis present

## 2022-11-02 DIAGNOSIS — Z560 Unemployment, unspecified: Secondary | ICD-10-CM | POA: Diagnosis not present

## 2022-11-02 DIAGNOSIS — F1423 Cocaine dependence with withdrawal: Secondary | ICD-10-CM | POA: Diagnosis present

## 2022-11-02 DIAGNOSIS — F29 Unspecified psychosis not due to a substance or known physiological condition: Secondary | ICD-10-CM | POA: Diagnosis present

## 2022-11-02 DIAGNOSIS — E876 Hypokalemia: Secondary | ICD-10-CM | POA: Diagnosis present

## 2022-11-02 DIAGNOSIS — M25552 Pain in left hip: Secondary | ICD-10-CM | POA: Diagnosis present

## 2022-11-02 DIAGNOSIS — I10 Essential (primary) hypertension: Secondary | ICD-10-CM | POA: Diagnosis present

## 2022-11-02 DIAGNOSIS — F1410 Cocaine abuse, uncomplicated: Secondary | ICD-10-CM | POA: Diagnosis present

## 2022-11-02 DIAGNOSIS — F17210 Nicotine dependence, cigarettes, uncomplicated: Secondary | ICD-10-CM | POA: Diagnosis present

## 2022-11-02 DIAGNOSIS — F1210 Cannabis abuse, uncomplicated: Secondary | ICD-10-CM | POA: Diagnosis present

## 2022-11-02 DIAGNOSIS — F3160 Bipolar disorder, current episode mixed, unspecified: Secondary | ICD-10-CM | POA: Diagnosis not present

## 2022-11-02 DIAGNOSIS — B20 Human immunodeficiency virus [HIV] disease: Secondary | ICD-10-CM | POA: Diagnosis present

## 2022-11-02 MED ORDER — DIPHENHYDRAMINE HCL 50 MG/ML IJ SOLN: 50.0000 mg | Freq: Three times a day (TID) | INTRAMUSCULAR | Status: DC | PRN

## 2022-11-02 MED ORDER — NAPROXEN 250 MG PO TABS
375.0000 mg | ORAL_TABLET | Freq: Two times a day (BID) | ORAL | Status: DC
  Administered 2022-11-02: 375 mg via ORAL
  Filled 2022-11-02: qty 2

## 2022-11-02 MED ORDER — LISINOPRIL 5 MG PO TABS
5.0000 mg | ORAL_TABLET | Freq: Every day | ORAL | Status: DC
  Administered 2022-11-03 – 2022-11-05 (×3): 5 mg via ORAL
  Filled 2022-11-02 (×6): qty 1

## 2022-11-02 MED ORDER — CLONIDINE HCL 0.1 MG PO TABS
0.1000 mg | ORAL_TABLET | Freq: Every day | ORAL | Status: DC
  Administered 2022-11-02: 0.1 mg via ORAL
  Filled 2022-11-02 (×4): qty 1

## 2022-11-02 MED ORDER — DIPHENHYDRAMINE HCL 25 MG PO CAPS: 50.0000 mg | ORAL_CAPSULE | Freq: Three times a day (TID) | ORAL | Status: DC | PRN

## 2022-11-02 MED ORDER — NICOTINE 14 MG/24HR TD PT24
14.0000 mg | MEDICATED_PATCH | Freq: Every day | TRANSDERMAL | Status: DC
  Administered 2022-11-02 – 2022-11-08 (×7): 14 mg via TRANSDERMAL
  Filled 2022-11-02 (×3): qty 1
  Filled 2022-11-02: qty 4
  Filled 2022-11-02 (×7): qty 1

## 2022-11-02 MED ORDER — IBUPROFEN 400 MG PO TABS: 400.0000 mg | ORAL_TABLET | Freq: Four times a day (QID) | ORAL | Status: DC | PRN

## 2022-11-02 MED ORDER — NICOTINE 21 MG/24HR TD PT24
21.0000 mg | MEDICATED_PATCH | Freq: Once | TRANSDERMAL | Status: DC
  Administered 2022-11-02: 21 mg via TRANSDERMAL
  Filled 2022-11-02: qty 1

## 2022-11-02 MED ORDER — NAPROXEN 375 MG PO TABS
375.0000 mg | ORAL_TABLET | Freq: Two times a day (BID) | ORAL | Status: DC
  Administered 2022-11-02: 375 mg via ORAL
  Filled 2022-11-02 (×3): qty 1

## 2022-11-02 NOTE — ED Notes (Signed)
Pt went to bathroom with sitter present. Pt c/o headache, so Naproxen administered and pt states she's going to try to sleep some more for now.

## 2022-11-02 NOTE — ED Notes (Signed)
Pt asleep. Vs deferred until am rounding.
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
## 2022-11-02 NOTE — Progress Notes (Addendum)
[facility] Admission Note:  Lori [name] is a 52 year old female voluntarily admitted to [facility] from [facility] on 11/02/22 due to suicidal ideation with inability to contract for safety. Patient reports that if she had pills available she would OD and "end it all right now". Patient reports that she was recently admitted here in December and was discharged on Abilify but she stopped taking the Abilify a few moths ago due to it making her feel too fatigued to do anything, stating, "I was too tired to even get out of bed and make a meal". Patient reports that she uses benzodiazepines, Crack, and THC regularly. Patient reports that she drink "sporadically" and will occasionally binge drink "a fifth of liquor a day". Patient reports that her last drink was on Sunday. Patient denies any withdrawal symptoms at this time. Patient reports 7/10 chronic pain in her left hip. Patient reports that her mother and father are her primary supporters. Patient reports that she currents lives in a mobile home with her brother. Patient's skin assesses and belongings searched. Patient oriented to unit. Q 15 minute safety checks initiated.

## 2022-11-02 NOTE — Tx Team (Signed)
Initial Treatment Plan 11/02/2022 5:52 PM JARRETT KORTAN ZOX:[id number]    PATIENT STRESSORS: Medication change or noncompliance   Substance abuse     PATIENT STRENGTHS: General fund of knowledge  Motivation for treatment/growth  Supportive family/friends    PATIENT IDENTIFIED PROBLEMS:   "Polysubstance Abuse"    "Suicidal Ideation"    "Need to be put on meds to help my mood, the Abilify isn't working"           DISCHARGE CRITERIA:  Improved stabilization in mood, thinking, and/or behavior  PRELIMINARY DISCHARGE PLAN: Outpatient therapy  PATIENT/FAMILY INVOLVEMENT: This treatment plan has been presented to and reviewed with the patient, GRAYLYN HIEBER.The patient has been given the opportunity to ask questions and make suggestions.  Earma Reading Tariq Pernell, RN 11/02/2022, 5:52 PM

## 2022-11-02 NOTE — ED Provider Notes (Signed)
Emergency Medicine Observation Re-evaluation Note  Lori [name] is a 52 y.o. female, seen on rounds today.  Pt initially presented to the ED for complaints of Suicidal Currently, the patient is resting comfortably.  Physical Exam  BP 130/84   Pulse 63   Temp 97.9 F (36.6 C) (Oral)   Resp 18   Ht 5\' 4"  (1.626 m)   Wt 86.6 kg   SpO2 96%   BMI 32.77 kg/m  Physical Exam Vitals and nursing note reviewed.  Constitutional:      General: She is not in acute distress.    Appearance: She is well-developed.  HENT:     Head: Normocephalic and atraumatic.  Eyes:     Conjunctiva/sclera: Conjunctivae normal.  Cardiovascular:     Rate and Rhythm: Normal rate and regular rhythm.     Heart sounds: No murmur heard. Pulmonary:     Effort: Pulmonary effort is normal. No respiratory distress.  Musculoskeletal:        General: No swelling.     Cervical back: Neck supple.  Skin:    General: Skin is warm and dry.     Capillary Refill: Capillary refill takes less than 2 seconds.  Neurological:     Mental Status: She is alert.  Psychiatric:        Mood and Affect: Mood normal.     ED Course / MDM  EKG:   I have reviewed the labs performed to date as well as medications administered while in observation.  Recent changes in the last 24 hours include TTS evaluation with recommendations for inpatient treatment.  Currently pending psychiatric placement.  Plan  Current plan is for psychiatric inpatient placement.    Glendora Score, MD 11/02/22 0900

## 2022-11-02 NOTE — ED Notes (Signed)
MD at bedside, pt placed on cardiac monitoring d/t HR in the 30s

## 2022-11-02 NOTE — ED Notes (Signed)
Pt in room resting after administering Nicotine patch and am medications. Very pleasant speaking however she did make the statement that if she had 100 pills she would probably take them just to go to sleep and not have to deal with people and life in general. We spoke about different types of mental health medications and she speaks very positively about getting help for her current situation.

## 2022-11-02 NOTE — Progress Notes (Signed)
Pt was accepted to [facility] TODAY 11/02/2022, pending signed voluntary consent faxed to [phone]. Bed assignment: 302-1  Pt meets inpatient criteria per Assunta Found, NP  Attending Physician will be Phineas Inches, MD  Report can be called to: - Adult unit: [phone]  Pt can arrive after pending items are received; [facility] AC to coordinate arrival time  Care Team Notified: [facility] Rona Ravens, RN, Maud Deed, RN, Assunta Found, NP, and Glendora Score, MD  [place], [place]  11/02/2022 1:31 PM

## 2022-11-02 NOTE — Consult Note (Signed)
Telepsych Consultation   Reason for Consult:  Suicidal ideation Referring Physician:   Glendora Score, MD   Location of Patient: [facility] Location of Provider: Other: [facility]  Patient Identification: Lori [name] MRN:  [id number] Principal Diagnosis: MDD (major depressive disorder), recurrent severe, without psychosis (HCC) Diagnosis:  Principal Problem:   MDD (major depressive disorder), recurrent severe, without psychosis (HCC) Active Problems:   Polysubstance abuse (HCC)   Suicidal ideation   Total Time spent with patient: 30 minutes  Subjective:  Lori [name] is a 40 yr. female with psychiatric history of major depression, bipolar disorder and polysubstance abuse who was admitted to [facility] ED after presenting with multiple medical complaints and suicidal ideation.  HPI:  Lori [name] Seen virtually via tele psych by this provider, consulted with Dr. Nelly Rout, and chart reviewed on 11/02/22.  On evaluation Lori [name] reports continues to endorse suicidal ideation and unable to contract for safety.  Reports if she had pills she would overdose. Reports she does not currently have any outpatient psychiatric services but she would like to stabilize on medications and then go to rehab prior to establishing outpatient services.  Reports she feels she needs to be stabilized before she can even participate in a rehab program.  Patient denies homicidal ideations, psychosis, paranoia. During evaluation Lori [name] is sitting on side of bed with no noted distress.  She is alert/oriented x 4, calm, cooperative, attentive, and responses were relevant and appropriate to assessment questions.  She spoke in a clear tone at moderate volume, and normal pace, with good eye contact.   She denies homicidal ideation, psychosis, and paranoia but continues to endorse suicidal ideation and unable to contract for safety.  Objectively:  there is no evidence of psychosis/mania or  delusional thinking.  She conversed coherently, with goal directed thoughts, and no distractibility, or pre-occupation.  Recommending inpatient psychiatric treatment    Past Psychiatric History:  Patient Active Problem List   Diagnosis Date Noted   MDD (major depressive disorder), recurrent severe, without psychosis (HCC) 11/02/2022   Polysubstance abuse (HCC) 11/02/2022   Suicidal ideation 11/02/2022   Bipolar disorder, unspecified (HCC) 05/16/2022   Opioid abuse (HCC) 05/16/2022   Cannabis use disorder 05/16/2022   Chronic post-traumatic stress disorder (PTSD) 05/16/2022   Rectal prolapse 11/27/2020   Alcohol abuse with alcohol-induced mood disorder (HCC) 11/14/2020   Tick bite 10/30/2020   Alcohol abuse 12/18/2019   Lung nodule 11/16/2017   Cocaine abuse (HCC) 11/16/2017   Syphilis 07/18/2017   Essential hypertension 07/22/2014   Migraine 10/31/2006   Human immunodeficiency virus (HIV) disease (HCC) 06/27/2006   ANXIETY DISORDER, GENERALIZED 06/27/2006   TOBACCO ABUSE 06/27/2006     Risk to Self:  Yes Risk to Others:   Prior Inpatient Therapy:   Prior Outpatient Therapy:    Past Medical History:  Past Medical History:  Diagnosis Date   Abnormal Pap smear    Had Cryo to treat   Allergy    Anemia    Anxiety    Cluster headaches    HIV infection (HCC)    Lung nodule    Vaginal venereal warts     Past Surgical History:  Procedure Laterality Date   ABDOMINAL HYSTERECTOMY     KNEE ARTHROSCOPY Left    bakers cyst removal   TUBAL LIGATION     Family History:  Family History  Problem Relation Age of Onset   CAD Mother  MI 2016   Uterine cancer Mother    Cervical cancer Mother    Colon cancer Mother    Lung cancer Maternal Grandmother    Cancer Maternal Grandfather        larynx   Prostate cancer Maternal Grandfather    Prostate cancer Father    Breast cancer Neg Hx    Liver disease Neg Hx    Esophageal cancer Neg Hx    Stomach cancer Neg Hx     Colon polyps Neg Hx    Family Psychiatric  History:  Family History  Problem Relation Age of Onset   CAD Mother        MI 2016   Uterine cancer Mother    Cervical cancer Mother    Colon cancer Mother    Lung cancer Maternal Grandmother    Cancer Maternal Grandfather        larynx   Prostate cancer Maternal Grandfather    Prostate cancer Father    Breast cancer Neg Hx    Liver disease Neg Hx    Esophageal cancer Neg Hx    Stomach cancer Neg Hx    Colon polyps Neg Hx     Social History:  Social History   Substance and Sexual Activity  Alcohol Use Yes   Alcohol/week: 0.0 standard drinks of alcohol   Comment: "a lot every day"      Social History   Substance and Sexual Activity  Drug Use Yes   Frequency: 7.0 times per week   Types: Marijuana, Cocaine   Comment: marijuana daily, cocaine as needed    Social History   Socioeconomic History   Marital status: Single    Spouse name: Not on file   Number of children: Not on file   Years of education: Not on file   Highest education level: Not on file  Occupational History   Not on file  Tobacco Use   Smoking status: Every Day    Packs/day: 1.00    Years: 27.00    Additional pack years: 0.00    Total pack years: 27.00    Types: Cigarettes   Smokeless tobacco: Never   Tobacco comments:    patient not ready to quit; considering patches  Vaping Use   Vaping Use: Never used  Substance and Sexual Activity   Alcohol use: Yes    Alcohol/week: 0.0 standard drinks of alcohol    Comment: "a lot every day"    Drug use: Yes    Frequency: 7.0 times per week    Types: Marijuana, Cocaine    Comment: marijuana daily, cocaine as needed   Sexual activity: Yes    Birth control/protection: Surgical, Condom    Comment: pt. declined condoms  Other Topics Concern   Not on file  Social History Narrative   Not on file   Social Determinants of Health   Financial Resource Strain: Low Risk  (02/04/2020)   Overall Financial  Resource Strain (CARDIA)    Difficulty of Paying Living Expenses: Not very hard  Food Insecurity: No Food Insecurity (05/16/2022)   Hunger Vital Sign    Worried About Running Out of Food in the Last Year: Never true    Ran Out of Food in the Last Year: Never true  Transportation Needs: Unmet Transportation Needs (05/16/2022)   PRAPARE - Transportation    Lack of Transportation (Medical): Yes    Lack of Transportation (Non-Medical): Yes  Physical Activity: Inactive (02/04/2020)   Exercise Vital Sign  Days of Exercise per Week: 0 days    Minutes of Exercise per Session: 0 min  Stress: Stress Concern Present (02/04/2020)   [employer] of Occupational Health - Occupational Stress Questionnaire    Feeling of Stress : Very much  Social Connections: Moderately Isolated (02/04/2020)   Social Connection and Isolation Panel [NHANES]    Frequency of Communication with Friends and Family: More than three times a week    Frequency of Social Gatherings with Friends and Family: Never    Attends Religious Services: 1 to 4 times per year    Active Member of [employer] or Organizations: No    Attends Banker Meetings: Never    Marital Status: Never married   Additional Social History:    Allergies:   Allergies  Allergen Reactions   Doxycycline Swelling   Other Swelling    Mulberry fruit    Sulfonamide Derivatives Other (See Comments)    Childhood allergy.    Labs:  Results for orders placed or performed during the hospital encounter of 11/01/22 (from the past 48 hour(s))  Comprehensive metabolic panel     Status: Abnormal   Collection Time: 11/01/22 10:26 AM  Result Value Ref Range   Sodium 139 135 - 145 mmol/L   Potassium 3.3 (L) 3.5 - 5.1 mmol/L   Chloride 105 98 - 111 mmol/L   CO2 26 22 - 32 mmol/L   Glucose, Bld 100 (H) 70 - 99 mg/dL    Comment: Glucose reference range applies only to samples taken after fasting for at least 8 hours.   BUN 11 6 - 20 mg/dL   Creatinine,  Ser 9.60 0.44 - 1.00 mg/dL   Calcium 9.2 8.9 - 45.4 mg/dL   Total Protein 6.8 6.5 - 8.1 g/dL   Albumin 4.2 3.5 - 5.0 g/dL   AST 17 15 - 41 U/L   ALT 12 0 - 44 U/L   Alkaline Phosphatase 60 38 - 126 U/L   Total Bilirubin 0.8 0.3 - 1.2 mg/dL   GFR, Estimated >09 >81 mL/min    Comment: (NOTE) Calculated using the CKD-EPI Creatinine Equation (2021)    Anion gap 8 5 - 15    Comment: Performed at [facility], [address], [place], [place] [address]  CBC with Differential     Status: None   Collection Time: 11/01/22 10:26 AM  Result Value Ref Range   WBC 9.2 4.0 - 10.5 K/uL   RBC 4.12 3.87 - 5.11 MIL/uL   Hemoglobin 13.5 12.0 - 15.0 g/dL   HCT 82.9 56.2 - 13.0 %   MCV 94.7 80.0 - 100.0 fL   MCH 32.8 26.0 - 34.0 pg   MCHC 34.6 30.0 - 36.0 g/dL   RDW 86.5 78.4 - 69.6 %   Platelets 228 150 - 400 K/uL   nRBC 0.0 0.0 - 0.2 %   Neutrophils Relative % 78 %   Neutro Abs 7.2 1.7 - 7.7 K/uL   Lymphocytes Relative 17 %   Lymphs Abs 1.5 0.7 - 4.0 K/uL   Monocytes Relative 5 %   Monocytes Absolute 0.4 0.1 - 1.0 K/uL   Eosinophils Relative 0 %   Eosinophils Absolute 0.0 0.0 - 0.5 K/uL   Basophils Relative 0 %   Basophils Absolute 0.0 0.0 - 0.1 K/uL   Immature Granulocytes 0 %   Abs Immature Granulocytes 0.03 0.00 - 0.07 K/uL    Comment: Performed at [facility], [address], [place], [place] [address]  Urinalysis, Routine w reflex microscopic -Urine, Clean Catch     Status: Abnormal   Collection Time: 11/01/22 10:31 AM  Result Value Ref Range   Color, Urine YELLOW YELLOW   APPearance CLEAR CLEAR   Specific Gravity, Urine 1.025 1.005 - 1.030   pH 6.0 5.0 - 8.0   Glucose, UA NEGATIVE NEGATIVE mg/dL   Hgb urine dipstick NEGATIVE NEGATIVE   Bilirubin Urine NEGATIVE NEGATIVE   Ketones, ur NEGATIVE NEGATIVE mg/dL   Protein, ur 30 (A) NEGATIVE mg/dL   Nitrite NEGATIVE NEGATIVE   Leukocytes,Ua NEGATIVE NEGATIVE    Comment: Performed at [facility], [address], [place], [place]  [address]  Rapid urine drug screen (hospital performed)     Status: Abnormal   Collection Time: 11/01/22 10:31 AM  Result Value Ref Range   Opiates NONE DETECTED NONE DETECTED   Cocaine POSITIVE (A) NONE DETECTED   Benzodiazepines POSITIVE (A) NONE DETECTED   Amphetamines NONE DETECTED NONE DETECTED   Tetrahydrocannabinol POSITIVE (A) NONE DETECTED   Barbiturates NONE DETECTED NONE DETECTED    Comment: (NOTE) DRUG SCREEN FOR MEDICAL PURPOSES ONLY.  IF CONFIRMATION IS NEEDED FOR ANY PURPOSE, NOTIFY LAB WITHIN 5 DAYS.  LOWEST DETECTABLE LIMITS FOR URINE DRUG SCREEN Drug Class                     Cutoff (ng/mL) Amphetamine and metabolites    1000 Barbiturate and metabolites    200 Benzodiazepine                 200 Opiates and metabolites        300 Cocaine and metabolites        300 THC                            50 Performed at [facility], [address] [place], [place] [address]   Urinalysis, Microscopic (reflex)     Status: None   Collection Time: 11/01/22 10:31 AM  Result Value Ref Range   RBC / HPF NONE SEEN 0 - 5 RBC/hpf   WBC, UA NONE SEEN 0 - 5 WBC/hpf   Bacteria, UA NONE SEEN NONE SEEN   Squamous Epithelial / HPF NONE SEEN 0 - 5 /HPF    Comment: Performed at [facility], [address], [place], [place] [address]    Medications:  Current Facility-Administered Medications  Medication Dose Route Frequency Provider Last Rate Last Admin   bictegravir-emtricitabine-tenofovir AF (BIKTARVY) 50-200-25 MG per tablet 1 tablet  1 tablet Oral Daily Kommor, Madison, MD   1 tablet at 11/02/22 1058   cloNIDine (CATAPRES) tablet 0.1 mg  0.1 mg Oral QHS Kommor, Madison, MD   0.1 mg at 11/01/22 2136   lisinopril (ZESTRIL) tablet 5 mg  5 mg Oral Daily Kommor, Madison, MD   5 mg at 11/02/22 1058   naproxen (NAPROSYN) tablet 375 mg  375 mg Oral BID WC Kommor, Madison, MD   375 mg at 11/02/22 0716   nicotine (NICODERM CQ - dosed in mg/24 hours) patch 21 mg  21 mg Transdermal Once Kommor,  Madison, MD   21 mg at 11/02/22 1058   Current Outpatient Medications  Medication Sig Dispense Refill   bictegravir-emtricitabine-tenofovir AF (BIKTARVY) 50-200-25 MG TABS tablet Take 1 tablet by mouth daily. 30 tablet 1   docusate sodium (COLACE) 100 MG capsule Take 1 capsule (100 mg total) by mouth 2 (two) times daily. 10 capsule  0   fluticasone (FLONASE) 50 MCG/ACT nasal spray Place 2 sprays into both nostrils daily. 15 g 2    Musculoskeletal: Strength & Muscle Tone: within normal limits Gait & Station: normal Patient leans: N/A          Psychiatric Specialty Exam:  Presentation  General Appearance:  Appropriate for Environment  Eye Contact: Good  Speech: Clear and Coherent; Normal Rate  Speech Volume: Normal  Handedness: Right   Mood and Affect  Mood: Depressed  Affect: Congruent   Thought Process  Thought Processes: Coherent; Goal Directed  Descriptions of Associations:Intact  Orientation:Full (Time, Place and Person)  Thought Content:Logical  History of Schizophrenia/Schizoaffective disorder:No  Duration of Psychotic Symptoms:No data recorded Hallucinations:Hallucinations: None  Ideas of Reference:None  Suicidal Thoughts:Suicidal Thoughts: Yes, Active SI Active Intent and/or Plan: With Intent; With Plan; With Means to Carry Out SI Passive Intent and/or Plan: Without Intent; Without Plan  Homicidal Thoughts:Homicidal Thoughts: No   Sensorium  Memory: Immediate Good; Recent Good  Judgment: Fair  Insight: Fair; Present   Executive Functions  Concentration: Good  Attention Span: Good  Recall: Good  Fund of Knowledge: Good  Language: Good   Psychomotor Activity  Psychomotor Activity: Psychomotor Activity: Normal   Assets  Assets: Communication Skills; Desire for Improvement; Financial Resources/Insurance; Housing; Leisure Time; Social Support   Sleep  Sleep: Sleep: Good    Physical Exam: Physical  Exam Vitals and nursing note reviewed.  Constitutional:      General: She is not in acute distress.    Appearance: Normal appearance. She is not ill-appearing.  HENT:     Head: Normocephalic.  Cardiovascular:     Rate and Rhythm: Normal rate.  Pulmonary:     Effort: Pulmonary effort is normal. No respiratory distress.  Neurological:     Mental Status: She is alert and oriented to person, place, and time.  Psychiatric:        Attention and Perception: Attention and perception normal. She does not perceive auditory or visual hallucinations.        Mood and Affect: Mood is anxious and depressed.        Speech: Speech normal.        Behavior: Behavior normal. Behavior is cooperative.        Thought Content: Thought content includes suicidal ideation. Thought content includes suicidal plan.        Judgment: Judgment is impulsive.    Review of Systems  Constitutional:        No other complaints voiced  Psychiatric/Behavioral:  Positive for depression, substance abuse and suicidal ideas. Negative for hallucinations. The patient is nervous/anxious.   All other systems reviewed and are negative.  Blood pressure 123/87, pulse 64, temperature 98.4 F (36.9 C), temperature source Oral, resp. rate 18, height 5\' 4"  (1.626 m), weight 86.6 kg, SpO2 99 %. Body mass index is 32.77 kg/m.  Treatment Plan Summary: Daily contact with patient to assess and evaluate symptoms and progress in treatment, Medication management, and Plan Inpatient psychiatric treatment  Disposition: Recommend psychiatric Inpatient admission when medically cleared.  This service was provided via telemedicine using a 2-way, interactive audio and video technology.  Names of all persons participating in this telemedicine service and their role in this encounter. Name: Marquist Binstock Role: NP  Name: Corlis Leak Role: Patient  Name:  Role:   Name:  Role:    Secure message sent to Dr. Audrie Lia informing that patient  recommended for inpatient psychiatric treatment  Assunta Found, NP  11/02/2022 1:55 PM

## 2022-11-03 ENCOUNTER — Encounter (HOSPITAL_COMMUNITY): Payer: Self-pay

## 2022-11-03 DIAGNOSIS — F411 Generalized anxiety disorder: Secondary | ICD-10-CM | POA: Insufficient documentation

## 2022-11-03 MED ORDER — HYDROXYZINE HCL 25 MG PO TABS
25.0000 mg | ORAL_TABLET | Freq: Three times a day (TID) | ORAL | Status: DC | PRN
  Administered 2022-11-03 – 2022-11-08 (×10): 25 mg via ORAL
  Filled 2022-11-03 (×10): qty 1

## 2022-11-03 MED ORDER — DIPHENHYDRAMINE HCL 50 MG/ML IJ SOLN: 50.0000 mg | Freq: Three times a day (TID) | INTRAMUSCULAR | Status: DC | PRN

## 2022-11-03 MED ORDER — TRAZODONE HCL 50 MG PO TABS
50.0000 mg | ORAL_TABLET | Freq: Every evening | ORAL | Status: DC | PRN
  Administered 2022-11-03 – 2022-11-07 (×5): 50 mg via ORAL
  Filled 2022-11-03 (×5): qty 1

## 2022-11-03 MED ORDER — LURASIDONE HCL 20 MG PO TABS
20.0000 mg | ORAL_TABLET | Freq: Every day | ORAL | Status: DC
  Administered 2022-11-03 – 2022-11-07 (×5): 20 mg via ORAL
  Filled 2022-11-03 (×2): qty 1
  Filled 2022-11-03: qty 4
  Filled 2022-11-03 (×4): qty 1
  Filled 2022-11-03: qty 4
  Filled 2022-11-03: qty 1

## 2022-11-03 MED ORDER — HALOPERIDOL 5 MG PO TABS: 5.0000 mg | ORAL_TABLET | Freq: Three times a day (TID) | ORAL | Status: DC | PRN

## 2022-11-03 MED ORDER — LORAZEPAM 2 MG/ML IJ SOLN: 2.0000 mg | Freq: Three times a day (TID) | INTRAMUSCULAR | Status: DC | PRN

## 2022-11-03 MED ORDER — MODAFINIL 100 MG PO TABS
100.0000 mg | ORAL_TABLET | Freq: Every day | ORAL | Status: DC
  Administered 2022-11-04 – 2022-11-08 (×5): 100 mg via ORAL
  Filled 2022-11-03 (×5): qty 1

## 2022-11-03 MED ORDER — DIPHENHYDRAMINE HCL 25 MG PO CAPS: 50.0000 mg | ORAL_CAPSULE | Freq: Three times a day (TID) | ORAL | Status: DC | PRN

## 2022-11-03 MED ORDER — HALOPERIDOL LACTATE 5 MG/ML IJ SOLN: 5.0000 mg | Freq: Three times a day (TID) | INTRAMUSCULAR | Status: DC | PRN

## 2022-11-03 MED ORDER — BICTEGRAVIR-EMTRICITAB-TENOFOV 50-200-25 MG PO TABS
1.0000 | ORAL_TABLET | Freq: Every day | ORAL | Status: DC
  Administered 2022-11-03 – 2022-11-08 (×6): 1 via ORAL
  Filled 2022-11-03 (×9): qty 1

## 2022-11-03 MED ORDER — LORAZEPAM 1 MG PO TABS: 2.0000 mg | ORAL_TABLET | Freq: Three times a day (TID) | ORAL | Status: DC | PRN

## 2022-11-03 MED ORDER — IBUPROFEN 400 MG PO TABS
400.0000 mg | ORAL_TABLET | Freq: Four times a day (QID) | ORAL | Status: DC | PRN
  Administered 2022-11-03 – 2022-11-05 (×6): 400 mg via ORAL
  Filled 2022-11-03 (×6): qty 1

## 2022-11-03 NOTE — Progress Notes (Signed)
   11/03/22 2000  Psych Admission Type (Psych Patients Only)  Admission Status Voluntary  Psychosocial Assessment  Patient Complaints Anxiety  Eye Contact Fair  Facial Expression Anxious  Affect Anxious  Speech Logical/coherent  Interaction Assertive  Motor Activity Slow  Appearance/Hygiene Unremarkable  Behavior Characteristics Cooperative;Appropriate to situation  Mood Pleasant;Anxious  Thought Process  Coherency WDL  Content WDL  Delusions None reported or observed  Perception WDL  Hallucination None reported or observed  Judgment Impaired  Confusion None  Danger to Self  Current suicidal ideation? Denies  Self-Injurious Behavior No self-injurious ideation or behavior indicators observed or expressed   Agreement Not to Harm Self Yes  Description of Agreement verbal  Danger to Others  Danger to Others None reported or observed   Alert/oriented. Makes needs/concerns known to staff. Pleasant cooperative with staff. Denies SI/HI/A/V hallucinations. Patient states went to group. Will encourage  compliance and progression towards goals. Verbally contracted for safety. Will continue to monitor.

## 2022-11-03 NOTE — Progress Notes (Signed)
Adult Psychoeducational Group Note  Date:  11/03/2022 Time:  9:24 PM  Group Topic/Focus:  Wrap-Up Group:   The focus of this group is to help patients review their daily goal of treatment and discuss progress on daily workbooks.  Participation Level:  Active  Participation Quality:  Appropriate  Affect:  Appropriate  Cognitive:  Appropriate  Insight: Appropriate  Engagement in Group:  Engaged  Modes of Intervention:  Discussion  Additional Comments:  Pt attended and participated in NA meeting.  Aarilyn Dye Katrinka Blazing 11/03/2022, 9:24 PM

## 2022-11-03 NOTE — Progress Notes (Signed)
EKG completed, MD provided with copy and signed off. Placed on patient's chart.

## 2022-11-03 NOTE — [facility] Group Notes (Signed)
[facility] Group Notes:  (Nursing/MHT/Case Management/Adjunct)  Date:  11/03/2022  Time:  2:26 AM  Type of Therapy:   Wrap-up group  Participation Level:  Active  Participation Quality:  Appropriate  Affect:  Appropriate  Cognitive:  Appropriate  Insight:  Appropriate  Engagement in Group:  Engaged  Modes of Intervention:  Education  Summary of Progress/Problems: Pt goal to attend groups, Pt reports she did attend. Day 6/10.  Noah Delaine 11/03/2022, 2:26 AM
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
## 2022-11-03 NOTE — ED Notes (Signed)
Pts HR increased, EKG completed, HR in the 50s, MD aware, [facility] NP aware, pt asymptomatic of any symptoms, Plan to continue with transport to [facility]

## 2022-11-03 NOTE — Plan of Care (Signed)
  Problem: Safety: Goal: Periods of time without injury will increase Outcome: Progressing   

## 2022-11-03 NOTE — Group Note (Signed)
Recreation Therapy Group Note   Group Topic:Health and Wellness  Group Date: 11/03/2022 Start Time: 1400 End Time: 1450 Facilitators: Wynell Halberg, Benito Mccreedy, LRT  Activity Description/Intervention: Therapeutic Drumming. Patients with peers and staff were given the opportunity to engage in a leader facilitated [facility] Group Empowerment Drumming Circle with staff from the [employer], in partnership with [employer].    Affect/Mood: N/A   Participation Level: Did not attend    Clinical Observations/Individualized Feedback: Pt declined RT programming offered in dayroom.    Benito Mccreedy Anderia Lorenzo, LRT, CTRS 11/03/2022 3:20 PM
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
## 2022-11-03 NOTE — Progress Notes (Signed)
   11/03/22 0000  Psych Admission Type (Psych Patients Only)  Admission Status Voluntary  Psychosocial Assessment  Patient Complaints Anxiety  Eye Contact Fair  Facial Expression Animated  Affect Appropriate to circumstance  Speech Logical/coherent  Interaction Assertive  Motor Activity Slow  Appearance/Hygiene Unremarkable  Behavior Characteristics Cooperative;Appropriate to situation  Mood Pleasant  Thought Process  Coherency WDL  Content WDL  Delusions None reported or observed  Perception WDL  Hallucination None reported or observed  Judgment Poor  Confusion None  Danger to Self  Current suicidal ideation? Denies  Self-Injurious Behavior No self-injurious ideation or behavior indicators observed or expressed   Agreement Not to Harm Self Yes  Description of Agreement verbal  Danger to Others  Danger to Others None reported or observed

## 2022-11-03 NOTE — Group Note (Signed)
Date:  11/03/2022 Time:  10:33 AM  Group Topic/Focus:  Goals Group:   The focus of this group is to help patients establish daily goals to achieve during treatment and discuss how the patient can incorporate goal setting into their daily lives to aide in recovery. Orientation:   The focus of this group is to educate the patient on the purpose and policies of crisis stabilization and provide a format to answer questions about their admission.  The group details unit policies and expectations of patients while admitted.    Participation Level:  Active  Participation Quality:  Appropriate  Affect:  Appropriate  Cognitive:  Appropriate  Insight: Appropriate  Engagement in Group:  Engaged  Modes of Intervention:  Discussion, Orientation, and Rapport Building  Additional Comments:   Pt attended and participated in the Orientation/Goals group. Pt personal goal is to "see the doctor, get on the right meds and learn coping skills for anxiety and depression".  Edmund Hilda Zachary Nole 11/03/2022, 10:33 AM

## 2022-11-03 NOTE — BH IP Treatment Plan (Signed)
Interdisciplinary Treatment and Diagnostic Plan Update  11/03/2022 Time of Session: 11:08 AM  Lori [name] MRN: [id number]  Principal Diagnosis: MDD (major depressive disorder), recurrent severe, without psychosis (HCC)  Secondary Diagnoses: Principal Problem:   MDD (major depressive disorder), recurrent severe, without psychosis (HCC)   Current Medications:  Current Facility-Administered Medications  Medication Dose Route Frequency Provider Last Rate Last Admin   bictegravir-emtricitabine-tenofovir AF (BIKTARVY) 50-200-25 MG per tablet 1 tablet  1 tablet Oral Daily Massengill, Harrold Donath, MD   1 tablet at 11/03/22 0743   haloperidol (HALDOL) tablet 5 mg  5 mg Oral TID PRN Phineas Inches, MD       And   LORazepam (ATIVAN) tablet 2 mg  2 mg Oral TID PRN Phineas Inches, MD       And   diphenhydrAMINE (BENADRYL) capsule 50 mg  50 mg Oral TID PRN Massengill, Harrold Donath, MD       haloperidol lactate (HALDOL) injection 5 mg  5 mg Intramuscular TID PRN Massengill, Harrold Donath, MD       And   LORazepam (ATIVAN) injection 2 mg  2 mg Intramuscular TID PRN Massengill, Harrold Donath, MD       And   diphenhydrAMINE (BENADRYL) injection 50 mg  50 mg Intramuscular TID PRN Massengill, Harrold Donath, MD       hydrOXYzine (ATARAX) tablet 25 mg  25 mg Oral TID PRN Phineas Inches, MD   25 mg at 11/03/22 9562   ibuprofen (ADVIL) tablet 400 mg  400 mg Oral Q6H PRN Phineas Inches, MD   400 mg at 11/03/22 0808   lisinopril (ZESTRIL) tablet 5 mg  5 mg Oral Daily Rankin, Shuvon B, NP   5 mg at 11/03/22 0809   lurasidone (LATUDA) tablet 20 mg  20 mg Oral Q supper Carlyn Reichert, MD       [name] ON 11/04/2022] modafinil (PROVIGIL) tablet 100 mg  100 mg Oral Daily Carlyn Reichert, MD       nicotine (NICODERM CQ - dosed in mg/24 hours) patch 14 mg  14 mg Transdermal Daily Massengill, Harrold Donath, MD   14 mg at 11/03/22 0800   traZODone (DESYREL) tablet 50 mg  50 mg Oral QHS PRN Massengill, Harrold Donath, MD       PTA  Medications: Medications Prior to Admission  Medication Sig Dispense Refill Last Dose   bictegravir-emtricitabine-tenofovir AF (BIKTARVY) 50-200-25 MG TABS tablet Take 1 tablet by mouth daily. 30 tablet 1    docusate sodium (COLACE) 100 MG capsule Take 1 capsule (100 mg total) by mouth 2 (two) times daily. 10 capsule 0    fluticasone (FLONASE) 50 MCG/ACT nasal spray Place 2 sprays into both nostrils daily. 15 g 2     Patient Stressors: Medication change or noncompliance   Substance abuse    Patient Strengths: General fund of knowledge  Motivation for treatment/growth  Supportive family/friends   Treatment Modalities: Medication Management, Group therapy, Case management,  1 to 1 session with clinician, Psychoeducation, Recreational therapy.   Physician Treatment Plan for Primary Diagnosis: MDD (major depressive disorder), recurrent severe, without psychosis (HCC) Long Term Goal(s):     Short Term Goals:    Medication Management: Evaluate patient's response, side effects, and tolerance of medication regimen.  Therapeutic Interventions: 1 to 1 sessions, Unit Group sessions and Medication administration.  Evaluation of Outcomes: Not Progressing  Physician Treatment Plan for Secondary Diagnosis: Principal Problem:   MDD (major depressive disorder), recurrent severe, without psychosis (HCC)  Long Term Goal(s):     Short  Term Goals:       Medication Management: Evaluate patient's response, side effects, and tolerance of medication regimen.  Therapeutic Interventions: 1 to 1 sessions, Unit Group sessions and Medication administration.  Evaluation of Outcomes: Not Progressing   RN Treatment Plan for Primary Diagnosis: MDD (major depressive disorder), recurrent severe, without psychosis (HCC) Long Term Goal(s): Knowledge of disease and therapeutic regimen to maintain health will improve  Short Term Goals: Ability to remain free from injury will improve, Ability to verbalize  frustration and anger appropriately will improve, Ability to demonstrate self-control, Ability to participate in decision making will improve, Ability to verbalize feelings will improve, Ability to disclose and discuss suicidal ideas, Ability to identify and develop effective coping behaviors will improve, and Compliance with prescribed medications will improve  Medication Management: RN will administer medications as ordered by provider, will assess and evaluate patient's response and provide education to patient for prescribed medication. RN will report any adverse and/or side effects to prescribing provider.  Therapeutic Interventions: 1 on 1 counseling sessions, Psychoeducation, Medication administration, Evaluate responses to treatment, Monitor vital signs and CBGs as ordered, Perform/monitor CIWA, COWS, AIMS and Fall Risk screenings as ordered, Perform wound care treatments as ordered.  Evaluation of Outcomes: Not Progressing   LCSW Treatment Plan for Primary Diagnosis: MDD (major depressive disorder), recurrent severe, without psychosis (HCC) Long Term Goal(s): Safe transition to appropriate next level of care at discharge, Engage patient in therapeutic group addressing interpersonal concerns.  Short Term Goals: Engage patient in aftercare planning with referrals and resources, Increase social support, Increase ability to appropriately verbalize feelings, Increase emotional regulation, Facilitate acceptance of mental health diagnosis and concerns, Facilitate patient progression through stages of change regarding substance use diagnoses and concerns, Identify triggers associated with mental health/substance abuse issues, and Increase skills for wellness and recovery  Therapeutic Interventions: Assess for all discharge needs, 1 to 1 time with Social worker, Explore available resources and support systems, Assess for adequacy in community support network, Educate family and significant other(s) on  suicide prevention, Complete Psychosocial Assessment, Interpersonal group therapy.  Evaluation of Outcomes: Not Progressing   Progress in Treatment: Attending groups: Yes. Participating in groups: Yes. Taking medication as prescribed: Yes. Toleration medication: No.Patient stated during treatment teams her Abilify medication was not working nor a good fit for her and that is why she is here  Family/Significant other contact made: No, will contact:  CSW will contact whoever patient gives [place] permission to contact  Patient understands diagnosis: Yes. Discussing patient identified problems/goals with staff: Yes. Medical problems stabilized or resolved: Yes. Denies suicidal/homicidal ideation: Yes. Issues/concerns per patient self-inventory: No.   New problem(s) identified: No, Describe:  None Reported   New Short Term/Long Term Goal(s):  medication stabilization, elimination of SI thoughts, development of comprehensive mental wellness plan.    Patient Goals:  " Get on new medications that will work for my depression "   Discharge Plan or Barriers: Patient recently admitted. CSW will continue to follow and assess for appropriate referrals and possible discharge planning.    Reason for Continuation of Hospitalization: Anxiety Depression Medication stabilization Suicidal ideation  Estimated Length of Stay: 3-5 days   Last 3 [place] Suicide Severity Risk Score: Flowsheet Row [facility] (Current) [facility]/01/2022 in [facility] from 08/19/2022 in [facility] RISK CATEGORY High Risk High Risk No Risk  Last PHQ 2/9 Scores:    08/28/2021   10:32 AM 11/27/2020    3:34 PM 10/30/2020    3:30 PM  Depression screen PHQ 2/9  Decreased Interest 0 0 0  Down, Depressed, Hopeless 1 0 0  PHQ - 2 Score 1 0 0    Scribe for Treatment  Team: Beather Arbour 11/03/2022 2:28 PM

## 2022-11-03 NOTE — BHH Group Notes (Signed)
Spiritual care group on grief and loss facilitated by chaplain Katy Altan Kraai, BCC   Group Goal:   Support / Education around grief and loss   Members engage in facilitated group support and psycho-social education.   Group Description:   Following introductions and group rules, group members engaged in facilitated group dialog and support around topic of loss, with particular support around experiences of loss in their lives. Group Identified types of loss (relationships / self / things) and identified patterns, circumstances, and changes that precipitate losses. Reflected on thoughts / feelings around loss, normalized grief responses, and recognized variety in grief experience. Group noted Worden's four tasks of grief in discussion.   Group drew on Adlerian / Rogerian, narrative, MI,   Patient Progress: Did not attend.  

## 2022-11-03 NOTE — Progress Notes (Signed)
Pt reported passive SI this morning without a plan. Patient verbally contracts for safety. Pt denied HI/AVH this morning. Pt rated her depression a 7/10, anxiety a 8/10, and feelings of hopelessness a 5/10. Pt complains of 7/10 pain in her left hip, PRN Ibuprofen administered. PRN hydroxyzine given for anxiety. Pt has been cooperative throughout the shift. RN provided support and encouragement to patient. Pt given scheduled medications as prescribed. Q15 min checks verified for safety. Patient compliant with medications and treatment plan. Patient is interacting well on the unit. Pt is safe on the unit.   11/03/22 0900  Psych Admission Type (Psych Patients Only)  Admission Status Voluntary  Psychosocial Assessment  Patient Complaints Anxiety;Depression;Other (Comment) (Left hip pain)  Eye Contact Fair  Facial Expression Anxious;Sad  Affect Anxious  Speech Logical/coherent  Interaction Assertive  Motor Activity Slow  Appearance/Hygiene Unremarkable  Behavior Characteristics Cooperative;Appropriate to situation  Mood Anxious;Pleasant  Thought Process  Coherency WDL  Content WDL  Delusions None reported or observed  Perception WDL  Hallucination None reported or observed  Judgment Impaired  Confusion None  Danger to Self  Current suicidal ideation? Passive  Self-Injurious Behavior Some self-injurious ideation observed or expressed.  No lethal plan expressed   Agreement Not to Harm Self Yes  Description of Agreement Pt verbally contracts for safety  Danger to Others  Danger to Others None reported or observed

## 2022-11-04 NOTE — Progress Notes (Addendum)
Pt denied SI/HI/AVH this morning. Pt rated her depression a 5/10, anxiety a 5/10, and feelings of hopelessness a 3/10. Pt reports that she slept "fair" last night. Pt complains of pain in her left hip and of a headache and rates her pain a 5/10. PRN Ibuprofen given for pain. Pt reports that her goal for today is to "attend group and talk to people". Pt has been pleasant, calm, and cooperative throughout the shift. RN provided support and encouragement to patient. Pt given scheduled medications as prescribed. Q15 min checks verified for safety. Patient verbally contracts for safety. Patient compliant with medications and treatment plan. Patient is interacting well on the unit. Pt is safe on the unit.   11/04/22 0900  Psych Admission Type (Psych Patients Only)  Admission Status Voluntary  Psychosocial Assessment  Patient Complaints Anxiety;Depression  Eye Contact Fair  Facial Expression Anxious  Affect Anxious  Speech Logical/coherent  Interaction Assertive  Motor Activity Slow  Appearance/Hygiene Unremarkable  Behavior Characteristics Cooperative;Appropriate to situation  Mood Anxious  Thought Process  Coherency WDL  Content WDL  Delusions None reported or observed  Perception WDL  Hallucination None reported or observed  Judgment Impaired  Confusion None  Danger to Self  Current suicidal ideation? Denies  Self-Injurious Behavior No self-injurious ideation or behavior indicators observed or expressed   Agreement Not to Harm Self Yes  Description of Agreement Pt verbally contracts for safety  Danger to Others  Danger to Others None reported or observed

## 2022-11-04 NOTE — Progress Notes (Signed)
[facility] MD Progress Note  11/04/2022 5:52 PM Lori [name]  MRN:  [id number]  Principal Problem: Bipolar disorder, unspecified (HCC) Diagnosis: Principal Problem:   Bipolar disorder, unspecified (HCC) Active Problems:   Cocaine abuse (HCC)   Cannabis use disorder   Chronic post-traumatic stress disorder (PTSD)   Generalized anxiety disorder    Reason for Admission:  The patient is a 52 year old female with a past psychiatric history of bipolar disorder, unspecified, as well as PTSD, both of which were diagnosed at her previous hospitalization at the behavioral health hospital in December 2023.  The patient presented for care at the emergency department with suicidal thoughts.  She is admitted to the [facility] on a voluntary basis.    Information obtained from 24-hour nursing report: The patient was noted to be cooperative and pleasant.   Information Obtained Today During Patient Interview:  The patient reports feeling somewhat "jittery" since starting the Provigil and Latuda.  She reports a strong desire to continue these medications to see if the side effect resolves.  She is hopeful about a strong benefit for the medication going forward.  She reports that she talked with her parole officer, which is something she hoped to accomplish today.  The patient reports good mood, appetite, and sleep. She denies suicidal and homicidal thoughts.  Review of systems as below.   Total Time spent with patient: 20 min  Past Psychiatric History: as per H and P  Past Medical History:  Past Medical History:  Diagnosis Date   Abnormal Pap smear    Had Cryo to treat   Allergy    Anemia    Anxiety    Cluster headaches    HIV infection (HCC)    Lung nodule    Vaginal venereal warts     Past Surgical History:  Procedure Laterality Date   ABDOMINAL HYSTERECTOMY     KNEE ARTHROSCOPY Left    bakers cyst removal   TUBAL LIGATION     Family History:  Family History   Problem Relation Age of Onset   CAD Mother        MI 2016   Uterine cancer Mother    Cervical cancer Mother    Colon cancer Mother    Lung cancer Maternal Grandmother    Cancer Maternal Grandfather        larynx   Prostate cancer Maternal Grandfather    Prostate cancer Father    Breast cancer Neg Hx    Liver disease Neg Hx    Esophageal cancer Neg Hx    Stomach cancer Neg Hx    Colon polyps Neg Hx    Family Psychiatric  History: as per H and P Social History:  Social History   Substance and Sexual Activity  Alcohol Use Yes   Alcohol/week: 0.0 standard drinks of alcohol   Comment: "a lot every day"      Social History   Substance and Sexual Activity  Drug Use Yes   Frequency: 7.0 times per week   Types: Marijuana, Cocaine   Comment: marijuana daily, cocaine as needed    Social History   Socioeconomic History   Marital status: Single    Spouse name: Not on file   Number of children: Not on file   Years of education: Not on file   Highest education level: Not on file  Occupational History   Not on file  Tobacco Use   Smoking status: Every Day    Packs/day: 1.00  Years: 27.00    Additional pack years: 0.00    Total pack years: 27.00    Types: Cigarettes   Smokeless tobacco: Never   Tobacco comments:    patient not ready to quit; considering patches  Vaping Use   Vaping Use: Never used  Substance and Sexual Activity   Alcohol use: Yes    Alcohol/week: 0.0 standard drinks of alcohol    Comment: "a lot every day"    Drug use: Yes    Frequency: 7.0 times per week    Types: Marijuana, Cocaine    Comment: marijuana daily, cocaine as needed   Sexual activity: Yes    Birth control/protection: Surgical, Condom    Comment: pt. declined condoms  Other Topics Concern   Not on file  Social History Narrative   Not on file   Social Determinants of Health   Financial Resource Strain: Low Risk  (02/04/2020)   Overall Financial Resource Strain (CARDIA)     Difficulty of Paying Living Expenses: Not very hard  Food Insecurity: No Food Insecurity (11/02/2022)   Hunger Vital Sign    Worried About Running Out of Food in the Last Year: Never true    Ran Out of Food in the Last Year: Never true  Transportation Needs: Unmet Transportation Needs (11/02/2022)   PRAPARE - Transportation    Lack of Transportation (Medical): Yes    Lack of Transportation (Non-Medical): Yes  Physical Activity: Inactive (02/04/2020)   Exercise Vital Sign    Days of Exercise per Week: 0 days    Minutes of Exercise per Session: 0 min  Stress: Stress Concern Present (02/04/2020)   [employer] of Occupational Health - Occupational Stress Questionnaire    Feeling of Stress : Very much  Social Connections: Moderately Isolated (02/04/2020)   Social Connection and Isolation Panel [NHANES]    Frequency of Communication with Friends and Family: More than three times a week    Frequency of Social Gatherings with Friends and Family: Never    Attends Religious Services: 1 to 4 times per year    Active Member of [employer] or Organizations: No    Attends Engineer, structural: Never    Marital Status: Never married   Additional Social History:                         Sleep: fair  Appetite: fair  Current Medications: Current Facility-Administered Medications  Medication Dose Route Frequency Provider Last Rate Last Admin   bictegravir-emtricitabine-tenofovir AF (BIKTARVY) 50-200-25 MG per tablet 1 tablet  1 tablet Oral Daily Massengill, Harrold Donath, MD   1 tablet at 11/04/22 1610   haloperidol (HALDOL) tablet 5 mg  5 mg Oral TID PRN Phineas Inches, MD       And   LORazepam (ATIVAN) tablet 2 mg  2 mg Oral TID PRN Phineas Inches, MD       And   diphenhydrAMINE (BENADRYL) capsule 50 mg  50 mg Oral TID PRN Massengill, Harrold Donath, MD       haloperidol lactate (HALDOL) injection 5 mg  5 mg Intramuscular TID PRN Massengill, Harrold Donath, MD       And   LORazepam (ATIVAN)  injection 2 mg  2 mg Intramuscular TID PRN Massengill, Harrold Donath, MD       And   diphenhydrAMINE (BENADRYL) injection 50 mg  50 mg Intramuscular TID PRN Massengill, Harrold Donath, MD       hydrOXYzine (ATARAX) tablet 25 mg  25 mg  Oral TID PRN Phineas Inches, MD   25 mg at 11/04/22 1657   ibuprofen (ADVIL) tablet 400 mg  400 mg Oral Q6H PRN Massengill, Harrold Donath, MD   400 mg at 11/04/22 1351   lisinopril (ZESTRIL) tablet 5 mg  5 mg Oral Daily Rankin, Shuvon B, NP   5 mg at 11/04/22 0742   lurasidone (LATUDA) tablet 20 mg  20 mg Oral Q supper Carlyn Reichert, MD   20 mg at 11/04/22 1656   modafinil (PROVIGIL) tablet 100 mg  100 mg Oral Daily Carlyn Reichert, MD   100 mg at 11/04/22 4098   nicotine (NICODERM CQ - dosed in mg/24 hours) patch 14 mg  14 mg Transdermal Daily Massengill, Harrold Donath, MD   14 mg at 11/04/22 0743   traZODone (DESYREL) tablet 50 mg  50 mg Oral QHS PRN Phineas Inches, MD   50 mg at 11/03/22 2132    Lab Results:  No results found for this or any previous visit (from the past 48 hour(s)).  Blood Alcohol level:  Lab Results  Component Value Date   ETH <10 05/15/2022   ETH 133 (H) 11/14/2020    Metabolic Disorder Labs: Lab Results  Component Value Date   HGBA1C 5.6 05/17/2022   MPG 114 05/17/2022   No results found for: "PROLACTIN" Lab Results  Component Value Date   CHOL 195 05/17/2022   TRIG 182 (H) 05/17/2022   HDL 38 (L) 05/17/2022   CHOLHDL 5.1 05/17/2022   VLDL 36 05/17/2022   LDLCALC 121 (H) 05/17/2022   LDLCALC 172 (H) 10/30/2020    Physical Findings:   Psychiatric Specialty Exam: Physical Exam Constitutional:      Appearance: the patient is not toxic-appearing.  Pulmonary:     Effort: Pulmonary effort is normal.  Neurological:     General: No focal deficit present.     Mental Status: the patient is alert and oriented to person, place, and time.   Review of Systems  Respiratory:  Negative for shortness of breath.   Cardiovascular:  Negative for  chest pain.  Gastrointestinal:  Negative for abdominal pain, constipation, diarrhea, nausea and vomiting.  Neurological:  Negative for headaches.      BP (!) 149/83 (BP Location: Left Arm)   Pulse (!) 52   Temp 98.4 F (36.9 C) (Oral)   Resp 20   Ht 5\' 4"  (1.626 m)   Wt 86.2 kg   SpO2 99%   BMI 32.61 kg/m   General Appearance: Fairly Groomed  Eye Contact:  Good  Speech:  Clear and Coherent  Volume:  Normal  Mood:  Euthymic  Affect:  Congruent  Thought Process:  Coherent  Orientation:  Full (Time, Place, and Person)  Thought Content: Logical   Suicidal Thoughts:  No  Homicidal Thoughts:  No  Memory:  Immediate;   Good  Judgement:  fair  Insight:  fair  Psychomotor Activity:  Normal  Concentration:  Concentration: Good  Recall:  Good  Fund of Knowledge: Good  Language: Good  Akathisia:  No  Handed:  not assessed  AIMS (if indicated): not done  Assets:  Communication Skills Desire for Improvement Financial Resources/Insurance Housing Leisure Time Physical Health  ADL's:  Intact  Cognition: WNL  Sleep:  Fair      Treatment Plan Summary: Daily contact with patient to assess and evaluate symptoms and progress in treatment and Medication management  Diagnoses / Active Problems: # Bipolar disorder, unspecified-current episode depressed #Chronic PTSD #GAD #Cocaine use disorder #  Cannabis use disorder #Alcohol use disorder #Tobacco use disorder     PLAN: Safety and Monitoring:             -- Voluntary admission to inpatient psychiatric unit for safety, stabilization and treatment             -- Daily contact with patient to assess and evaluate symptoms and progress in treatment             -- Patient's case to be discussed in multi-disciplinary team meeting             -- Observation Level : q15 minute checks             -- Vital signs:  q12 hours             -- Precautions: suicide, elopement, and assault   2. Psychiatric Diagnoses and Treatment:   -Patient has no home psychiatric medications - Discontinue clonidine.  This medication was restarted even though she is not taking it at home.  They may be causing bradycardia (see further discussion below) -Started Latuda 20 mg q. supper for bipolar depression and mood stabilization (started on 5/22) - Started Provigil 100 mg daily for bipolar depression (started on 5/23) - Will consider adding Lexapro in the coming days              3. Medical Issues Being Addressed:              Labs review notable for urine drug screen positive for cocaine, benzodiazepines, and marijuana. - CIWA for 3 days because of alcohol use   EKG on admission was sinus bradycardia.  The patient is asymptomatic.  Consulted with cardiology, Dr. Allyson Sabal, who is not concerned as long as her pulse remains above 50 bpm.               Tobacco Use Disorder             -- Nicotine patch 21mg /24 hours ordered             -- Smoking cessation encouraged - Patient requests nicotine patch prescription as well as gum on discharge.  She reports a desire to stop smoking.   4. Discharge Planning:              -- Social work and case management to assist with discharge planning and identification of hospital follow-up needs prior to discharge             -- Estimated LOS: 5-7 days             -- Discharge Concerns: Need to establish a safety plan; Medication compliance and effectiveness             -- Discharge Goals: Return home with outpatient referrals for mental health follow-up including medication management/psychotherapy   Carlyn Reichert, MD PGY-2

## 2022-11-04 NOTE — Progress Notes (Signed)
Adult Psychoeducational Group Note  Date:  11/04/2022 Time:  9:02 PM  Group Topic/Focus:  Wrap-Up Group:   The focus of this group is to help patients review their daily goal of treatment and discuss progress on daily workbooks.  Participation Level:  Active  Participation Quality:  Appropriate  Affect:  Appropriate  Cognitive:  Appropriate  Insight: Appropriate  Engagement in Group:  Engaged  Modes of Intervention:  Discussion  Additional Comments:  Pt stated her goal for the day was good. Pt goal for the day was to find coping skills and to be friendly. Pt met goal.  Lucilla Lame 11/04/2022, 9:02 PM

## 2022-11-04 NOTE — BHH Counselor (Signed)
Adult Comprehensive Assessment  Patient ID: Lori [name], female   DOB: 1970/10/09, 52 y.o.   MRN: [id number]  Information Source: Information source: Patient  Current Stressors:  Patient states their primary concerns and needs for treatment are:: 52 year old female pt presents with SI, stating that if she had pills she wouldl"end it all". Pt reports that she had been admitted to in December and was prescribed Abilify and now is reporting extreme fatigue as a side effect. Pt also acknowledges past and current Substance use and dependence to numerous substances to include: Crack Cocaine, ETOH and Polysubstance abuse. Pt acknowledged symptoms of: Anhedonia, fatigue, depressed and increased dependence on substances. pt expressed the desire for Medication Stabilization. pt denies SI/HI during assessment. 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 I am his caregiver Financial / Lack of resources (include bankruptcy): I only get less than 1000  Dollars per month Housing / Lack of housing: none reported Physical health (include injuries & life threatening diseases): pt reports several chronic conditions to include: HIV,Hypertension and Migraine Social relationships: none reported Substance abuse: Crack Cocaine, ETOH and Opioid Dependence Bereavement / Loss: none reported  Living/Environment/Situation:  Living Arrangements: Other relatives Who else lives in the home?: My brother How long has patient lived in current situation?: 5 years What is atmosphere in current home: Chaotic  Family History:  Are you sexually active?: Yes What is your sexual orientation?: Bisexual  Childhood History:     Education:  Highest grade of school patient has completed: GED Currently a student?: No Learning  disability?: No  Employment/Work Situation:   Employment Situation: On disability Why is Patient on Disability: Due to anxiety and HIV How Long has Patient Been on Disability: Since 2009 Patient's Job has Been Impacted by Current Illness: No What is the Longest Time Patient has Held a Job?: 6 years Where was the Patient Employed at that Time?: restaurant Has Patient ever Been in the [employer]?: No  Financial Resources:   Financial resources: Insurance claims handler, Medicare Does patient have a Lawyer or guardian?: No  Alcohol/Substance Abuse:   What has been your use of drugs/alcohol within the last 12 months?: Crack Cocaine, ETOH and Opiod Dependence. "I use until my money runs out" If attempted suicide, did drugs/alcohol play a role in this?: Yes Alcohol/Substance Abuse Treatment Hx: Past Tx, Inpatient, Attends AA/NA, Past detox, Past Tx, Outpatient, Substance abuse evaluation, Relapse prevention program If yes, describe treatment: I have been in a lot of Rehabs Has alcohol/substance abuse ever caused legal problems?: Yes  Social Support System:   Patient's Community Support System: None Describe Community Support System: Pt is not currently receing care in her community Type of faith/religion: Christianity How does patient's faith help to cope with current illness?: I isolate  Leisure/Recreation:   Do You Have Hobbies?: Yes Leisure and Hobbies: Reading  Strengths/Needs:   What is the patient's perception of their strengths?: I am kind to others Patient states they can use these personal strengths during their treatment to contribute to their recovery: I will reach out when I need help Patient states these barriers may affect/interfere with their treatment: none reported Patient states these barriers may affect their return to the community: once i am stabilized on my meds  Discharge Plan:   Currently receiving community mental health services: No Patient states concerns  and preferences for aftercare planning are: I would like to be close to home Patient states they will know when they are safe and ready for discharge when: Once I start taking my medications Does patient have access to transportation?: Yes Does patient have financial barriers related to discharge medications?: No Patient description of barriers related to discharge medications: none reported Will patient be returning to same living situation after discharge?: Yes  Summary/Recommendations:   Summary and Recommendations (to be completed by the evaluator): 52 y/o female pt present to [facility] with SI/. Pt reports a prior admission in December 2023 and indicates that she had been prescribed Abilify. Pt reports efficacy for approximately 1 month post discharge and then reports that she had experienced extreme fatigue and had decided to stop taking the medication. Pt expressed the desire to stabilize on a new medication with less side effects. Pt denies SI/HI or Sh at this time While here, Lori [name] can benefit from crisis stabilization, medication management, therapeutic milieu, and referrals for services.  Lori [name] S Lael Wetherbee. 11/04/2022

## 2022-11-04 NOTE — Group Note (Signed)
Occupational Therapy Group Note  Group Topic: Sleep Hygiene  Group Date: 11/04/2022 Start Time: 1435 End Time: 1505 Facilitators: Zyon Rosser G, OT   Group Description: Group encouraged increased participation and engagement through topic focused on sleep hygiene. Patients reflected on the quality of sleep they typically receive and identified areas that need improvement. Group was given background information on sleep and sleep hygiene, including common sleep disorders. Group members also received information on how to improve one's sleep and introduced a sleep diary as a tool that can be utilized to track sleep quality over a length of time. Group session ended with patients identifying one or more strategies they could utilize or implement into their sleep routine in order to improve overall sleep quality.        Therapeutic Goal(s):  Identify one or more strategies to improve overall sleep hygiene  Identify one or more areas of sleep that are negatively impacted (sleep too much, too little, etc)     Participation Level: Engaged   Participation Quality: Independent   Behavior: Appropriate   Speech/Thought Process: Relevant   Affect/Mood: Appropriate   Insight: Fair   Judgement: Fair      Modes of Intervention: Education  Patient Response to Interventions:  Attentive   Plan: Continue to engage patient in OT groups 2 - 3x/week.  11/04/2022  Lori [name], OT   Lori [name], OT  

## 2022-11-04 NOTE — BHH Group Notes (Signed)
The focus of this group is to help patients establish daily goals to achieve during treatment and discuss how the patient can incorporate goal setting into their daily lives to aide in recovery.     Scale 1-10 6 out of 10  \  Goal: learn more coping skills and be nicer

## 2022-11-04 NOTE — Progress Notes (Signed)
   11/04/22 2113  Psych Admission Type (Psych Patients Only)  Admission Status Voluntary  Psychosocial Assessment  Patient Complaints Anxiety  Eye Contact Fair  Facial Expression Anxious  Affect Anxious  Speech Logical/coherent  Interaction Assertive  Motor Activity Slow  Appearance/Hygiene Unremarkable  Behavior Characteristics Cooperative;Appropriate to situation  Mood Anxious  Thought Process  Coherency WDL  Content WDL  Delusions None reported or observed  Perception WDL  Hallucination None reported or observed  Judgment Impaired  Confusion None  Danger to Self  Current suicidal ideation? Denies  Self-Injurious Behavior No self-injurious ideation or behavior indicators observed or expressed   Agreement Not to Harm Self Yes  Description of Agreement verbal  Danger to Others  Danger to Others None reported or observed   Alert/oriented. Makes needs/concerns known to staff. Pleasant cooperative with staff. Denies SI/HI/A/V hallucinations. Med compliant. Patient states went to group. Will encourage compliance and progression towards goals. Verbally contracted for safety. Will continue to monitor.

## 2022-11-04 NOTE — Group Note (Signed)
Date:  11/04/2022 Time:  12:29 PM  Group Topic/Focus:  Emotional Education:   The focus of this group is to discuss what feelings/emotions are, and how they are experienced. Self Care:   The focus of this group is to help patients understand the importance of self-care in order to improve or restore emotional, physical, spiritual, interpersonal, and financial health.    Participation Level:  Active  Participation Quality:  Appropriate  Affect:  Appropriate  Cognitive:  Appropriate  Insight: Appropriate  Engagement in Group:  Engaged  Modes of Intervention:  Discussion, Exploration, Socialization, and Support  Additional Comments:    Memory Dance Lori [name] 11/04/2022, 12:29 PM

## 2022-11-04 NOTE — Plan of Care (Signed)
  Problem: Safety: Goal: Periods of time without injury will increase Outcome: Progressing   

## 2022-11-05 MED ORDER — LISINOPRIL 10 MG PO TABS
10.0000 mg | ORAL_TABLET | Freq: Every day | ORAL | Status: DC
  Administered 2022-11-06 – 2022-11-08 (×3): 10 mg via ORAL
  Filled 2022-11-05 (×3): qty 1
  Filled 2022-11-05: qty 3
  Filled 2022-11-05: qty 1

## 2022-11-05 MED ORDER — POTASSIUM CHLORIDE 20 MEQ PO PACK
20.0000 meq | PACK | Freq: Once | ORAL | Status: AC
  Administered 2022-11-05: 20 meq via ORAL
  Filled 2022-11-05: qty 1

## 2022-11-05 MED ORDER — POTASSIUM CHLORIDE CRYS ER 20 MEQ PO TBCR: 40.0000 meq | EXTENDED_RELEASE_TABLET | Freq: Once | ORAL | Status: DC

## 2022-11-05 MED ORDER — LISINOPRIL 5 MG PO TABS
5.0000 mg | ORAL_TABLET | Freq: Once | ORAL | Status: AC
  Administered 2022-11-05: 5 mg via ORAL
  Filled 2022-11-05: qty 1

## 2022-11-05 MED ORDER — IBUPROFEN 600 MG PO TABS
600.0000 mg | ORAL_TABLET | Freq: Four times a day (QID) | ORAL | Status: AC | PRN
  Administered 2022-11-05 – 2022-11-06 (×3): 600 mg via ORAL
  Filled 2022-11-05 (×3): qty 1

## 2022-11-05 NOTE — Progress Notes (Signed)
   11/05/22 0800  Psych Admission Type (Psych Patients Only)  Admission Status Voluntary  Psychosocial Assessment  Patient Complaints Anxiety  Eye Contact Fair  Facial Expression Anxious  Affect Anxious;Irritable  Speech Logical/coherent  Interaction Assertive  Motor Activity Slow  Appearance/Hygiene Unremarkable  Behavior Characteristics Cooperative  Mood Anxious (slightly irritable.)  Thought Process  Coherency WDL  Content WDL  Delusions None reported or observed  Perception WDL  Hallucination None reported or observed  Judgment Impaired  Confusion None  Danger to Self  Current suicidal ideation? Denies  Self-Injurious Behavior No self-injurious ideation or behavior indicators observed or expressed   Agreement Not to Harm Self Yes  Description of Agreement verbal  Danger to Others  Danger to Others None reported or observed

## 2022-11-05 NOTE — Progress Notes (Signed)
   11/05/22 1647  Vital Signs  Pulse Rate (!) 48  Pulse Rate Source Dinamap  Resp 16  BP (!) 157/108  BP Location Left Arm  BP Method Automatic  Patient Position (if appropriate) Sitting   Given additional ordered Lisinopril 5mg  PO.

## 2022-11-05 NOTE — Group Note (Signed)
Date:  11/05/2022 Time:  10:55 AM  Group Topic/Focus:  Goals Group:   The focus of this group is to help patients establish daily goals to achieve during treatment and discuss how the patient can incorporate goal setting into their daily lives to aide in recovery.    Participation Level:  Active  Participation Quality:  Appropriate  Affect:  Appropriate  Cognitive:  Appropriate  Insight: Appropriate  Engagement in Group:  Engaged  Modes of Intervention:  Discussion  Additional Comments:     Reymundo Poll 11/05/2022, 10:55 AM

## 2022-11-05 NOTE — Progress Notes (Addendum)
[facility] MD Progress Note  11/05/2022 3:03 PM Lori [name]  MRN:  [id number]  Principal Problem: Bipolar disorder, unspecified (HCC) Diagnosis: Principal Problem:   Bipolar disorder, unspecified (HCC) Active Problems:   Cocaine abuse (HCC)   Cannabis use disorder   Chronic post-traumatic stress disorder (PTSD)   Generalized anxiety disorder  Reason for Admission:  The patient is a 52 year old female with a past psychiatric history of bipolar disorder, unspecified, as well as PTSD, both of which were diagnosed at her previous hospitalization at the behavioral health hospital in December 2023.  The patient presented for care at the emergency department with suicidal thoughts.  She is admitted to the [facility] behavioral hospital on a voluntary basis.    Information obtained from 24-hour nursing report: The patient was noted to be cooperative and pleasant.   Information Obtained Today During Patient Interview:  The patient reports resolution of her sensation of jitteriness.  She feels positively about her current medication regimen.  She reports good mood, sleep, and appetite.  She denies experiencing any form of suicidal thoughts over the past day, which is a first for this hospitalization.  She continues to deny any feelings of dizziness or presyncope.  Discussed her hypokalemia and her bradycardia and ongoing consultation with medical specialists.  Called and discussed the patient's situation with her mother Lori [name], [phone].  Lori [name] confirms the social history as described by the patient.  She emphasizes the patient's drug use and the negative consequences that has had for the patient.  She reports that the patient uses suicidal statements as a threat to get her way with her parents.  She reports prior overdoses with the intention to harm herself but nothing in the past several months.  She states that she is sure the patient has no access to a firearm.    Total Time spent with patient: 20  min  Past Psychiatric History: as per H and P  Past Medical History:  Past Medical History:  Diagnosis Date   Abnormal Pap smear    Had Cryo to treat   Allergy    Anemia    Anxiety    Cluster headaches    HIV infection (HCC)    Lung nodule    Vaginal venereal warts     Past Surgical History:  Procedure Laterality Date   ABDOMINAL HYSTERECTOMY     KNEE ARTHROSCOPY Left    bakers cyst removal   TUBAL LIGATION     Family History:  Family History  Problem Relation Age of Onset   CAD Mother        MI 2016   Uterine cancer Mother    Cervical cancer Mother    Colon cancer Mother    Lung cancer Maternal Grandmother    Cancer Maternal Grandfather        larynx   Prostate cancer Maternal Grandfather    Prostate cancer Father    Breast cancer Neg Hx    Liver disease Neg Hx    Esophageal cancer Neg Hx    Stomach cancer Neg Hx    Colon polyps Neg Hx    Family Psychiatric  History: as per H and P Social History:  Social History   Substance and Sexual Activity  Alcohol Use Yes   Alcohol/week: 0.0 standard drinks of alcohol   Comment: "a lot every day"      Social History   Substance and Sexual Activity  Drug Use Yes   Frequency: 7.0 times per week  Types: Marijuana, Cocaine   Comment: marijuana daily, cocaine as needed    Social History   Socioeconomic History   Marital status: Single    Spouse name: Not on file   Number of children: Not on file   Years of education: Not on file   Highest education level: Not on file  Occupational History   Not on file  Tobacco Use   Smoking status: Every Day    Packs/day: 1.00    Years: 27.00    Additional pack years: 0.00    Total pack years: 27.00    Types: Cigarettes   Smokeless tobacco: Never   Tobacco comments:    patient not ready to quit; considering patches  Vaping Use   Vaping Use: Never used  Substance and Sexual Activity   Alcohol use: Yes    Alcohol/week: 0.0 standard drinks of alcohol    Comment: "a  lot every day"    Drug use: Yes    Frequency: 7.0 times per week    Types: Marijuana, Cocaine    Comment: marijuana daily, cocaine as needed   Sexual activity: Yes    Birth control/protection: Surgical, Condom    Comment: pt. declined condoms  Other Topics Concern   Not on file  Social History Narrative   Not on file   Social Determinants of Health   Financial Resource Strain: Low Risk  (02/04/2020)   Overall Financial Resource Strain (CARDIA)    Difficulty of Paying Living Expenses: Not very hard  Food Insecurity: No Food Insecurity (11/02/2022)   Hunger Vital Sign    Worried About Running Out of Food in the Last Year: Never true    Ran Out of Food in the Last Year: Never true  Transportation Needs: Unmet Transportation Needs (11/02/2022)   PRAPARE - Transportation    Lack of Transportation (Medical): Yes    Lack of Transportation (Non-Medical): Yes  Physical Activity: Inactive (02/04/2020)   Exercise Vital Sign    Days of Exercise per Week: 0 days    Minutes of Exercise per Session: 0 min  Stress: Stress Concern Present (02/04/2020)   [employer] of Occupational Health - Occupational Stress Questionnaire    Feeling of Stress : Very much  Social Connections: Moderately Isolated (02/04/2020)   Social Connection and Isolation Panel [NHANES]    Frequency of Communication with Friends and Family: More than three times a week    Frequency of Social Gatherings with Friends and Family: Never    Attends Religious Services: 1 to 4 times per year    Active Member of [employer] or Organizations: No    Attends Engineer, structural: Never    Marital Status: Never married   Additional Social History:                         Sleep: fair  Appetite: fair  Current Medications: Current Facility-Administered Medications  Medication Dose Route Frequency Provider Last Rate Last Admin   bictegravir-emtricitabine-tenofovir AF (BIKTARVY) 50-200-25 MG per tablet 1 tablet  1  tablet Oral Daily Massengill, Nathan, MD   1 tablet at 11/05/22 0814   haloperidol (HALDOL) tablet 5 mg  5 mg Oral TID PRN Phineas Inches, MD       And   LORazepam (ATIVAN) tablet 2 mg  2 mg Oral TID PRN Massengill, Harrold Donath, MD       And   diphenhydrAMINE (BENADRYL) capsule 50 mg  50 mg Oral TID PRN Massengill, Harrold Donath,  MD       haloperidol lactate (HALDOL) injection 5 mg  5 mg Intramuscular TID PRN Massengill, Harrold Donath, MD       And   LORazepam (ATIVAN) injection 2 mg  2 mg Intramuscular TID PRN Massengill, Harrold Donath, MD       And   diphenhydrAMINE (BENADRYL) injection 50 mg  50 mg Intramuscular TID PRN Massengill, Harrold Donath, MD       hydrOXYzine (ATARAX) tablet 25 mg  25 mg Oral TID PRN Phineas Inches, MD   25 mg at 11/05/22 0816   ibuprofen (ADVIL) tablet 600 mg  600 mg Oral Q6H PRN Carlyn Reichert, MD   600 mg at 11/05/22 1309   lisinopril (ZESTRIL) tablet 5 mg  5 mg Oral Daily Rankin, Shuvon B, NP   5 mg at 11/05/22 0814   lurasidone (LATUDA) tablet 20 mg  20 mg Oral Q supper Carlyn Reichert, MD   20 mg at 11/04/22 1656   modafinil (PROVIGIL) tablet 100 mg  100 mg Oral Daily Carlyn Reichert, MD   100 mg at 11/05/22 7829   nicotine (NICODERM CQ - dosed in mg/24 hours) patch 14 mg  14 mg Transdermal Daily Massengill, Harrold Donath, MD   14 mg at 11/05/22 0816   traZODone (DESYREL) tablet 50 mg  50 mg Oral QHS PRN Phineas Inches, MD   50 mg at 11/04/22 2158    Lab Results:  No results found for this or any previous visit (from the past 48 hour(s)).  Blood Alcohol level:  Lab Results  Component Value Date   ETH <10 05/15/2022   ETH 133 (H) 11/14/2020    Metabolic Disorder Labs: Lab Results  Component Value Date   HGBA1C 5.6 05/17/2022   MPG 114 05/17/2022   No results found for: "PROLACTIN" Lab Results  Component Value Date   CHOL 195 05/17/2022   TRIG 182 (H) 05/17/2022   HDL 38 (L) 05/17/2022   CHOLHDL 5.1 05/17/2022   VLDL 36 05/17/2022   LDLCALC 121 (H) 05/17/2022    LDLCALC 172 (H) 10/30/2020    Physical Findings:   Psychiatric Specialty Exam: Physical Exam Constitutional:      Appearance: the patient is not toxic-appearing.  Pulmonary:     Effort: Pulmonary effort is normal.  Neurological:     General: No focal deficit present.     Mental Status: the patient is alert and oriented to person, place, and time.   Review of Systems  Respiratory:  Negative for shortness of breath.   Cardiovascular:  Negative for chest pain.  Gastrointestinal:  Negative for abdominal pain, constipation, diarrhea, nausea and vomiting.  Neurological:  Negative for headaches.      BP (!) 176/94 (BP Location: Left Arm)   Pulse (!) 41   Temp 97.9 F (36.6 C) (Oral)   Resp 18   Ht 5\' 4"  (1.626 m)   Wt 86.2 kg   SpO2 99%   BMI 32.61 kg/m   General Appearance: Fairly Groomed  Eye Contact:  Good  Speech:  Clear and Coherent  Volume:  Normal  Mood:  Euthymic  Affect:  Congruent  Thought Process:  Coherent  Orientation:  Full (Time, Place, and Person)  Thought Content: Logical   Suicidal Thoughts:  No  Homicidal Thoughts:  No  Memory:  Immediate;   Good  Judgement:  fair  Insight:  fair  Psychomotor Activity:  Normal  Concentration:  Concentration: Good  Recall:  Good  Fund of Knowledge: Good  Language: Good  Akathisia:  No  Handed:  not assessed  AIMS (if indicated): not done  Assets:  Communication Skills Desire for Improvement Financial Resources/Insurance Housing Leisure Time Physical Health  ADL's:  Intact  Cognition: WNL  Sleep:  Fair      Treatment Plan Summary: Daily contact with patient to assess and evaluate symptoms and progress in treatment and Medication management  Diagnoses / Active Problems: # Bipolar disorder, unspecified-current episode depressed #Chronic PTSD #GAD #Cocaine use disorder #Cannabis use disorder #Alcohol use disorder #Tobacco use disorder     PLAN: Safety and Monitoring:             -- Voluntary  admission to inpatient psychiatric unit for safety, stabilization and treatment             -- Daily contact with patient to assess and evaluate symptoms and progress in treatment             -- Patient's case to be discussed in multi-disciplinary team meeting             -- Observation Level : q15 minute checks             -- Vital signs:  q12 hours             -- Precautions: suicide, elopement, and assault   2. Psychiatric Diagnoses and Treatment:  -Patient has no home psychiatric medications - Discontinue clonidine.  This medication was restarted even though she is not taking it at home.  They may be causing bradycardia (see further discussion below) -Started Latuda 20 mg q. supper for bipolar depression and mood stabilization (started on 5/22) - Started Provigil 100 mg daily for bipolar depression (started on 5/23) - Will consider adding Lexapro in the coming days              3. Medical Issues Being Addressed:              Labs review notable for urine drug screen positive for cocaine, benzodiazepines, and marijuana. - CIWA for 3 days because of alcohol use   Bradycardia: EKG on admission was sinus bradycardia .  The patient is asymptomatic.  Consulted with cardiology, Dr. Allyson Sabal, who was not concerned as long as her pulse remains above 50 bpm.  Over the past 12 hours the patient has had a heart rate in the 40s.  Discussed with cardiology today (Dr Izora Ribas), who stated that this also is not problematic because she is asymptomatic.  He sees no indication for pacemaker placement or outpatient cardiac evaluation.  Will obtain hospitalist recommendation for plan of care regarding blood pressure control, which was the recommendation of the cardiologist. -See hospitalist note for full details. In brief, he recommends lisinopril 10 mg daily and vital signs every 8 hours, with the first 1 being a manual blood pressure.  Discussed plan for repletion of potassium.              Tobacco Use  Disorder             -- Nicotine patch 21mg /24 hours ordered             -- Smoking cessation encouraged - Patient requests nicotine patch prescription as well as gum on discharge.  She reports a desire to stop smoking.   4. Discharge Planning:              -- Social work and case management to assist with discharge planning and identification of hospital follow-up needs prior  to discharge             -- Estimated LOS: 5-7 days             -- Discharge Concerns: Need to establish a safety plan; Medication compliance and effectiveness             -- Discharge Goals: Return home with outpatient referrals for mental health follow-up including medication management/psychotherapy   Carlyn Reichert, MD PGY-2

## 2022-11-05 NOTE — Group Note (Signed)
Date:  11/05/2022 Time:  10:26 PM  Group Topic/Focus:  AA    Participation Level:  Did Not Attend   Scot Dock 11/05/2022, 10:26 PM

## 2022-11-05 NOTE — Progress Notes (Signed)
Consultation Progress Note   Patient: Lori [name] ZOX:[id number] DOB: Mar 25, 1971 DOA: 11/02/2022 DOS: the patient was seen and examined on 11/05/2022 Primary service: Phineas Inches, MD  Brief hospital course:  Assessment and Plan: Essential hypertension Patient's BP 170/94 was measured by [facility], it was recommended for this to be checked manually and to document in the chart.  Patient is currently on lisinopril 5 mg, it was advised to increase this to 10 mg and to repeat BP in 2 hours, after which BP can be checked every 4 hours until BP comes down to systolic 130s-140s.  This may then be decreased to every shift till BP comes to an average of 120/80.  This consult was made remotely, resident physician was asked to call back if BP is not well-controlled.  Face-to-face consult will then be made at this time.  Hypokalemia Last BMP was 5/20 with K+ of 3.3, this will be replenished BMP in the morning   TRH will continue to follow the patient. Remotely.  Subjective:  Patient is a 52 year old female with past psychiatric history of HIV on Biktarvy, polysubstance abuse, bipolar disorder, unspecified, PTSD (diagnosed at [facility], December 2023) who presented to the emergency department at [facility] on 5/20 with complaints of suicidal ideation and headache.  Psychiatry was consulted and patient was admitted to inpatient at [facility] on voluntary basis. Patient was reported to present with elevated BP at 176/94 with HR of 41, cardiology was consulted and was told to discontinue clonidine and to consult medicine for BP control.  Otherwise, no need for any cardiology intervention at this time per psychiatry resident (Dr. Teola Bradley).  TRH was consulted for BP management.   Physical Exam: Vitals:   11/04/22 2047 11/05/22 0601 11/05/22 0602 11/05/22 0937  BP: (!) 171/101 (!) 158/107 (!) 160/86 (!) 176/94  Pulse: 60 (!) 45 (!) 48 (!) 41  Resp:  20  18  Temp:  97.9 F (36.6 C)    TempSrc:   Oral    SpO2:   99%   Weight:      Height:        Data Reviewed: EKG personally reviewed showed sinus bradycardia with rate of 43 bpm   Time spent: 18 minutes.  Author: Frankey Shown, DO 11/05/2022 3:26 PM  For on call review [email or web address].

## 2022-11-05 NOTE — Plan of Care (Signed)
  Problem: Safety: Goal: Periods of time without injury will increase Outcome: Progressing   

## 2022-11-05 NOTE — Group Note (Signed)
Date:  11/05/2022 Time:  6:31 PM  Group Topic/Focus:  Wellness Toolbox:   The focus of this group is to discuss various aspects of wellness, balancing those aspects and exploring ways to increase the ability to experience wellness.  Patients will create a wellness toolbox for use upon discharge.    Participation Level:  Active  Participation Quality:  Appropriate  Affect:  Appropriate  Cognitive:  Appropriate  Insight: Appropriate  Engagement in Group:  Engaged  Modes of Intervention:  Education  Additional Comments:     Reymundo Poll 11/05/2022, 6:31 PM

## 2022-11-05 NOTE — Progress Notes (Signed)
   11/05/22 2108  Psych Admission Type (Psych Patients Only)  Admission Status Voluntary  Psychosocial Assessment  Patient Complaints Anxiety  Eye Contact Fair  Facial Expression Anxious  Affect Anxious  Speech Logical/coherent  Interaction Assertive  Motor Activity Slow  Appearance/Hygiene Unremarkable  Behavior Characteristics Cooperative  Mood Anxious  Thought Process  Coherency WDL  Content WDL  Delusions None reported or observed  Perception WDL  Hallucination None reported or observed  Judgment Impaired  Confusion None  Danger to Self  Current suicidal ideation? Denies  Self-Injurious Behavior No self-injurious ideation or behavior indicators observed or expressed   Agreement Not to Harm Self Yes  Description of Agreement verbal  Danger to Others  Danger to Others None reported or observed   Alert/oriented. Makes needs/concerns known to staff. Pleasant cooperative with staff. Denies SI/HI/A/V hallucinations. Med compliant. Patient states went to group. Will encourage compliance and progression towards goals. Verbally contracted for safety. Will continue to monitor.

## 2022-11-06 LAB — BASIC METABOLIC PANEL
Anion gap: 6 (ref 5–15)
BUN: 20 mg/dL (ref 6–20)
CO2: 27 mmol/L (ref 22–32)
Calcium: 8.9 mg/dL (ref 8.9–10.3)
Chloride: 108 mmol/L (ref 98–111)
Creatinine, Ser: 1.07 mg/dL — ABNORMAL HIGH (ref 0.44–1.00)
GFR, Estimated: >60 mL/min (ref >60)
Glucose, Bld: 92 mg/dL (ref 70–99)
Potassium: 4 mmol/L (ref 3.5–5.1)
Sodium: 141 mmol/L (ref 135–145)

## 2022-11-06 NOTE — [facility] Group Notes (Signed)
[facility] Group Notes:  (Nursing/MHT/Case Management/Adjunct)  Date:  11/06/2022  Time:  9:45 PM  Type of Therapy:   Wrap-up group  Participation Level:  Active  Participation Quality:  Appropriate  Affect:  Appropriate  Cognitive:  Appropriate  Insight:  Appropriate  Engagement in Group:  Engaged  Modes of Intervention:  Education  Summary of Progress/Problems: Pt goal to learn more coping skills, day 7/10. Participated in a communication exercise.   Noah Delaine 11/06/2022, 9:45 PM

## 2022-11-06 NOTE — Progress Notes (Signed)
[facility] MD Progress Note  11/06/2022 8:08 AM Lori [name]  MRN:  [id number]  Principal Problem: Bipolar disorder, unspecified (HCC) Diagnosis: Principal Problem:   Bipolar disorder, unspecified (HCC) Active Problems:   Cocaine abuse (HCC)   Cannabis use disorder   Chronic post-traumatic stress disorder (PTSD)   Generalized anxiety disorder  Reason for Admission:  The patient is a 52 year old female with a past psychiatric history of bipolar disorder, unspecified, as well as PTSD, both of which were diagnosed at her previous hospitalization at the behavioral health hospital in December 2023.  The patient presented for care at the emergency department with suicidal thoughts.  She is admitted to the [facility] behavioral hospital on a voluntary basis.    Information obtained from 24-hour nursing report: Med consult replenished K and ordered bmp, TRH following remotely. No acute behavioral concerns.   Information Obtained Today During Patient Interview:  Patient reported that her mood is "good", however she does feel "wired".  She attributed this to her 2 cups of coffee this morning or possibly her modafinil or both.  She denied dizziness, headache, heart palpitation, chest pain, shortness of breath. Reported her sleep and appetite remained stable and appropriate.  Otherwise she denies any other side effects to medication at this time. She denied active and passive SI/HI, AVH, paranoia.  Last time she had SI was Wednesday 5/22.  Total Time spent with patient: 20 min  Past Psychiatric History: as per H and P  Past Medical History:  Past Medical History:  Diagnosis Date   Abnormal Pap smear    Had Cryo to treat   Allergy    Anemia    Anxiety    Cluster headaches    HIV infection (HCC)    Lung nodule    Vaginal venereal warts     Past Surgical History:  Procedure Laterality Date   ABDOMINAL HYSTERECTOMY     KNEE ARTHROSCOPY Left    bakers cyst removal   TUBAL LIGATION     Family  History:  Family History  Problem Relation Age of Onset   CAD Mother        MI 2016   Uterine cancer Mother    Cervical cancer Mother    Colon cancer Mother    Lung cancer Maternal Grandmother    Cancer Maternal Grandfather        larynx   Prostate cancer Maternal Grandfather    Prostate cancer Father    Breast cancer Neg Hx    Liver disease Neg Hx    Esophageal cancer Neg Hx    Stomach cancer Neg Hx    Colon polyps Neg Hx    Family Psychiatric  History: as per H and P Social History:  Social History   Substance and Sexual Activity  Alcohol Use Yes   Alcohol/week: 0.0 standard drinks of alcohol   Comment: "a lot every day"      Social History   Substance and Sexual Activity  Drug Use Yes   Frequency: 7.0 times per week   Types: Marijuana, Cocaine   Comment: marijuana daily, cocaine as needed    Social History   Socioeconomic History   Marital status: Single    Spouse name: Not on file   Number of children: Not on file   Years of education: Not on file   Highest education level: Not on file  Occupational History   Not on file  Tobacco Use   Smoking status: Every Day    Packs/day:  1.00    Years: 27.00    Additional pack years: 0.00    Total pack years: 27.00    Types: Cigarettes   Smokeless tobacco: Never   Tobacco comments:    patient not ready to quit; considering patches  Vaping Use   Vaping Use: Never used  Substance and Sexual Activity   Alcohol use: Yes    Alcohol/week: 0.0 standard drinks of alcohol    Comment: "a lot every day"    Drug use: Yes    Frequency: 7.0 times per week    Types: Marijuana, Cocaine    Comment: marijuana daily, cocaine as needed   Sexual activity: Yes    Birth control/protection: Surgical, Condom    Comment: pt. declined condoms  Other Topics Concern   Not on file  Social History Narrative   Not on file   Social Determinants of Health   Financial Resource Strain: Low Risk  (02/04/2020)   Overall Financial  Resource Strain (CARDIA)    Difficulty of Paying Living Expenses: Not very hard  Food Insecurity: No Food Insecurity (11/02/2022)   Hunger Vital Sign    Worried About Running Out of Food in the Last Year: Never true    Ran Out of Food in the Last Year: Never true  Transportation Needs: Unmet Transportation Needs (11/02/2022)   PRAPARE - Transportation    Lack of Transportation (Medical): Yes    Lack of Transportation (Non-Medical): Yes  Physical Activity: Inactive (02/04/2020)   Exercise Vital Sign    Days of Exercise per Week: 0 days    Minutes of Exercise per Session: 0 min  Stress: Stress Concern Present (02/04/2020)   [employer] of Occupational Health - Occupational Stress Questionnaire    Feeling of Stress : Very much  Social Connections: Moderately Isolated (02/04/2020)   Social Connection and Isolation Panel [NHANES]    Frequency of Communication with Friends and Family: More than three times a week    Frequency of Social Gatherings with Friends and Family: Never    Attends Religious Services: 1 to 4 times per year    Active Member of [employer] or Organizations: No    Attends Engineer, structural: Never    Marital Status: Never married   Sleep: per above  Appetite: per above  Current Medications: Current Facility-Administered Medications  Medication Dose Route Frequency Provider Last Rate Last Admin   bictegravir-emtricitabine-tenofovir AF (BIKTARVY) 50-200-25 MG per tablet 1 tablet  1 tablet Oral Daily Massengill, Harrold Donath, MD   1 tablet at 11/06/22 0806   haloperidol (HALDOL) tablet 5 mg  5 mg Oral TID PRN Phineas Inches, MD       And   LORazepam (ATIVAN) tablet 2 mg  2 mg Oral TID PRN Phineas Inches, MD       And   diphenhydrAMINE (BENADRYL) capsule 50 mg  50 mg Oral TID PRN Massengill, Harrold Donath, MD       haloperidol lactate (HALDOL) injection 5 mg  5 mg Intramuscular TID PRN Massengill, Harrold Donath, MD       And   LORazepam (ATIVAN) injection 2 mg  2 mg  Intramuscular TID PRN Massengill, Harrold Donath, MD       And   diphenhydrAMINE (BENADRYL) injection 50 mg  50 mg Intramuscular TID PRN Massengill, Harrold Donath, MD       hydrOXYzine (ATARAX) tablet 25 mg  25 mg Oral TID PRN Phineas Inches, MD   25 mg at 11/05/22 1509   ibuprofen (ADVIL) tablet 600 mg  600 mg Oral Q6H PRN Carlyn Reichert, MD   600 mg at 11/05/22 1309   lisinopril (ZESTRIL) tablet 10 mg  10 mg Oral Daily Carlyn Reichert, MD   10 mg at 11/06/22 0806   lurasidone (LATUDA) tablet 20 mg  20 mg Oral Q supper Carlyn Reichert, MD   20 mg at 11/05/22 1656   modafinil (PROVIGIL) tablet 100 mg  100 mg Oral Daily Carlyn Reichert, MD   100 mg at 11/05/22 1610   nicotine (NICODERM CQ - dosed in mg/24 hours) patch 14 mg  14 mg Transdermal Daily Massengill, Harrold Donath, MD   14 mg at 11/05/22 0816   traZODone (DESYREL) tablet 50 mg  50 mg Oral QHS PRN Phineas Inches, MD   50 mg at 11/05/22 2139    Lab Results:  Results for orders placed or performed during the hospital encounter of 11/02/22 (from the past 48 hour(s))  Basic metabolic panel     Status: Abnormal   Collection Time: 11/06/22  6:24 AM  Result Value Ref Range   Sodium 141 135 - 145 mmol/L   Potassium 4.0 3.5 - 5.1 mmol/L   Chloride 108 98 - 111 mmol/L   CO2 27 22 - 32 mmol/L   Glucose, Bld 92 70 - 99 mg/dL    Comment: Glucose reference range applies only to samples taken after fasting for at least 8 hours.   BUN 20 6 - 20 mg/dL   Creatinine, Ser 9.60 (H) 0.44 - 1.00 mg/dL   Calcium 8.9 8.9 - 45.4 mg/dL   GFR, Estimated >09 >81 mL/min    Comment: (NOTE) Calculated using the CKD-EPI Creatinine Equation (2021)    Anion gap 6 5 - 15    Comment: Performed at [facility], [address], [place], [place] [address]    Blood Alcohol level:  Lab Results  Component Value Date   ETH <10 05/15/2022   ETH 133 (H) 11/14/2020    Metabolic Disorder Labs: Lab Results  Component Value Date   HGBA1C 5.6 05/17/2022    MPG 114 05/17/2022   No results found for: "PROLACTIN" Lab Results  Component Value Date   CHOL 195 05/17/2022   TRIG 182 (H) 05/17/2022   HDL 38 (L) 05/17/2022   CHOLHDL 5.1 05/17/2022   VLDL 36 05/17/2022   LDLCALC 121 (H) 05/17/2022   LDLCALC 172 (H) 10/30/2020    Physical Findings:   Psychiatric Specialty Exam: Physical Exam Constitutional:      Appearance: the patient is not toxic-appearing.  Pulmonary:     Effort: Pulmonary effort is normal.  Neurological:     General: No focal deficit present.     Mental Status: the patient is alert and oriented to person, place, and time.   Review of Systems  Respiratory:  Negative for shortness of breath.   Cardiovascular:  Negative for chest pain.  Gastrointestinal:  Negative for abdominal pain, constipation, diarrhea, nausea and vomiting.  Neurological:  Negative for headaches.      BP 130/86 (BP Location: Left Arm)   Pulse (!) 58   Temp 98 F (36.7 C) (Oral)   Resp 16   Ht 5\' 4"  (1.626 m)   Wt 86.2 kg   SpO2 98%   BMI 32.61 kg/m   General Appearance: Fairly Groomed  Eye Contact:  Good  Speech:  Clear and Coherent  Volume:  Normal  Mood:  Euthymic  Affect:  Congruent  Thought Process:  Coherent  Orientation:  Full (Time, Place,  and Person)  Thought Content: Logical   Suicidal Thoughts:  No  Homicidal Thoughts:  No  Memory:  Immediate;   Good  Judgement:  fair  Insight:  fair  Psychomotor Activity:  Normal  Concentration:  Concentration: Good  Recall:  Good  Fund of Knowledge: Good  Language: Good  Akathisia:  No  Handed:  not assessed  AIMS (if indicated): not done  Assets:  Communication Skills Desire for Improvement Financial Resources/Insurance Housing Leisure Time Physical Health  ADL's:  Intact  Cognition: WNL  Sleep:  Good      Treatment Plan Summary: Daily contact with patient to assess and evaluate symptoms and progress in treatment and Medication management  Diagnoses / Active  Problems: # Bipolar disorder, unspecified-current episode depressed #Chronic PTSD #GAD #Cocaine use disorder #Cannabis use disorder #Alcohol use disorder #Tobacco use disorder     PLAN: Safety and Monitoring:             -- Voluntary admission to inpatient psychiatric unit for safety, stabilization and treatment             -- Daily contact with patient to assess and evaluate symptoms and progress in treatment             -- Patient's case to be discussed in multi-disciplinary team meeting             -- Observation Level : q15 minute checks             -- Vital signs:  q12 hours             -- Precautions: suicide, elopement, and assault   2. Psychiatric Diagnoses and Treatment:  -Patient has no home psychiatric medications - Discontinue clonidine.  This medication was restarted even though she is not taking it at home.  They may be causing bradycardia (see further discussion below) - Continued Latuda 20 mg q. supper for bipolar depression and mood stabilization (started on 5/22) - Continued Provigil 100 mg daily for bipolar depression (started on 5/23) - Will consider adding Lexapro in the coming days              3. Medical Issues Being Addressed:              Labs review notable for urine drug screen positive for cocaine, benzodiazepines, and marijuana. - CIWA for 3 days because of alcohol use   Bradycardia: EKG on admission was sinus bradycardia .  The patient is asymptomatic.  Consulted with cardiology, Dr. Allyson Sabal, who was not concerned as long as her pulse remains above 50 bpm.  Over the past 12 hours the patient has had a heart rate in the 40s.  Discussed with cardiology today (Dr Izora Ribas), who stated that this also is not problematic because she is asymptomatic.  He sees no indication for pacemaker placement or outpatient cardiac evaluation.  Will obtain hospitalist recommendation for plan of care regarding blood pressure control, which was the recommendation of the  cardiologist. - See hospitalist note for full details. In brief, he recommends lisinopril 10 mg daily and vital signs every 8 hours, with the first 1 being a manual blood pressure.  TRH repleated K.               Tobacco Use Disorder             -- Nicotine patch 21mg /24 hours ordered             -- Smoking cessation encouraged -  Patient requests nicotine patch prescription as well as gum on discharge.  She reports a desire to stop smoking.   4. Discharge Planning:              -- Social work and case management to assist with discharge planning and identification of hospital follow-up needs prior to discharge             -- Estimated LOS: 5-7 days             -- Discharge Concerns: Need to establish a safety plan; Medication compliance and effectiveness             -- Discharge Goals: Return home with outpatient referrals for mental health follow-up including medication management/psychotherapy   Princess Bruins, DO PGY-2

## 2022-11-06 NOTE — Progress Notes (Addendum)
D. Pt presents with an anxious affect and friendly upon approach- rated her depression, hopelessness and anxiety a 3/3/6, respectively. Pt reported sleeping well last night, endorse having a good appetitie, normal energy level, but poor concentration today. Pt has been visible in the milieu observed interacting well with peers and attending groups. Pt's stated goal today is "to work on more coping skills and making a home plan." Pt currently denies SI/HI and AVH .  A. Labs and vitals monitored. Pt given and educated on medications. Pt given ibuprofen for hip pain rated 5/10, and Vistaril for anxiety. Pt supported emotionally and encouraged to express concerns and ask questions.   R. Pt remains safe with 15 minute checks. Will continue POC.    11/06/22 0900  Psych Admission Type (Psych Patients Only)  Admission Status Voluntary  Psychosocial Assessment  Patient Complaints Anxiety  Eye Contact Fair  Facial Expression Anxious  Affect Appropriate to circumstance  Speech Logical/coherent  Interaction Assertive  Motor Activity Slow  Appearance/Hygiene Unremarkable  Behavior Characteristics Cooperative  Mood Anxious;Pleasant  Thought Process  Coherency WDL  Content WDL  Delusions None reported or observed  Perception WDL  Hallucination None reported or observed  Judgment Impaired  Confusion None  Danger to Self  Current suicidal ideation? Denies  Self-Injurious Behavior No self-injurious ideation or behavior indicators observed or expressed   Agreement Not to Harm Self Yes  Description of Agreement agrees to contact staff before acting on harmful thoughts  Danger to Others  Danger to Others None reported or observed

## 2022-11-06 NOTE — Progress Notes (Signed)
[facility] PROGRESS NOTE  Lori [name] (DOB: Apr 02, 1971; MRN [id number]) is a 52 y.o. female with a history of HIV, polysubstance use, bipolar disorder, PTSD who presented to the [facility] with complaints of suicidal ideation and headache, subsequently admitted voluntarily to [facility]. Due to ongoing sinus bradycardia and HTN, cardiology was contacted and recommended no interventions. Hospitalist service was consulted for further recommendations.   A/P: HTN:  - Continue increased lisinopril dose of 10mg . Extensive outpatient records reviewed showing persistently elevated BPs in line with her current readings. Improved overall on lisinopril  Sinus bradycardia: No symptoms/hypotension. This, too, is chronic.  - Avoid AV nodal blocking agents.   HIV disease:  - Continue biktarvy and regular follow up with Dr. [name], his de facto PCP.   Hypokalemia: Supplemented.  - Recommendations conveyed to primary team by phone today.   Tyrone Nine, MD [facility] [email or web address] 11/06/2022, 3:35 PM

## 2022-11-06 NOTE — Group Note (Signed)
Date:  11/06/2022 Time:  12:18 PM  Group Topic/Focus:  Goals Group:   The focus of this group is to help patients establish daily goals to achieve during treatment and discuss how the patient can incorporate goal setting into their daily lives to aide in recovery. Orientation:   The focus of this group is to educate the patient on the purpose and policies of crisis stabilization and provide a format to answer questions about their admission.  The group details unit policies and expectations of patients while admitted.    Participation Level:  Active  Participation Quality:  Appropriate  Affect:  Appropriate  Cognitive:  Appropriate  Insight: Appropriate  Engagement in Group:  Engaged  Modes of Intervention:  Activity, Discussion, and Orientation  Additional Comments:   Pt attended and participated in the Orientation/Goals group.  Edmund Hilda Yanky Vanderburg 11/06/2022, 12:18 PM

## 2022-11-06 NOTE — Progress Notes (Deleted)
   11/06/22 0900  Psych Admission Type (Psych Patients Only)  Admission Status Voluntary  Psychosocial Assessment  Patient Complaints Depression  Eye Contact Fair  Facial Expression Pained  Affect Depressed  Speech Logical/coherent  Interaction Assertive  Motor Activity Slow  Appearance/Hygiene Unremarkable  Behavior Characteristics Cooperative  Mood Depressed;Anxious  Thought Process  Coherency WDL  Content WDL  Delusions None reported or observed  Perception WDL  Hallucination None reported or observed  Judgment Impaired  Confusion None  Danger to Self  Current suicidal ideation? Denies  Self-Injurious Behavior No self-injurious ideation or behavior indicators observed or expressed   Agreement Not to Harm Self Yes  Description of Agreement agrees to contact staff before acting on harmful thoughts

## 2022-11-06 NOTE — Progress Notes (Signed)
   11/06/22 2234  Psych Admission Type (Psych Patients Only)  Admission Status Voluntary  Psychosocial Assessment  Patient Complaints Anxiety  Eye Contact Fair  Facial Expression Anxious  Affect Appropriate to circumstance  Speech Logical/coherent  Interaction Assertive  Motor Activity Other (Comment) (WDL)  Appearance/Hygiene Unremarkable  Behavior Characteristics Appropriate to situation  Mood Pleasant  Thought Process  Coherency WDL  Content WDL  Delusions None reported or observed  Perception WDL  Hallucination None reported or observed  Judgment Impaired  Confusion None  Danger to Self  Current suicidal ideation? Denies  Self-Injurious Behavior No self-injurious ideation or behavior indicators observed or expressed   Agreement Not to Harm Self Yes  Description of Agreement verbal  Danger to Others  Danger to Others None reported or observed

## 2022-11-07 MED ORDER — IBUPROFEN 400 MG PO TABS
400.0000 mg | ORAL_TABLET | Freq: Once | ORAL | Status: AC | PRN
  Administered 2022-11-07: 400 mg via ORAL
  Filled 2022-11-07: qty 1

## 2022-11-07 MED ORDER — ACETAMINOPHEN 325 MG PO TABS
650.0000 mg | ORAL_TABLET | Freq: Four times a day (QID) | ORAL | Status: DC | PRN
  Administered 2022-11-07 – 2022-11-08 (×3): 650 mg via ORAL
  Filled 2022-11-07 (×4): qty 2

## 2022-11-07 NOTE — Progress Notes (Addendum)
D. Pt presents anxious, but has been appropriate on the unit, friendly, during interactions- visible in the milieu interacting well with peers and staff, and observed attending groups. Per pt's self inventory, pt rated her depression,hopelessness and anxiety a 2/2/5, respectively. Pt continues to report chronic hip, knee pain, rated 7/10.   Pt currently denies SI/HI and AVH  A. Labs and vitals monitored. Pt given scheduled and prn meds for comfort.  . Pt supported emotionally and encouraged to express concerns and ask questions.   R. Pt remains safe with 15 minute checks. Will continue POC.    11/07/22 1100  Psych Admission Type (Psych Patients Only)  Admission Status Voluntary  Psychosocial Assessment  Patient Complaints Anxiety  Eye Contact Fair  Facial Expression Anxious  Affect Appropriate to circumstance  Speech Logical/coherent  Interaction Assertive  Motor Activity Slow  Appearance/Hygiene Unremarkable  Behavior Characteristics Cooperative;Appropriate to situation  Mood Anxious;Pleasant  Thought Process  Coherency WDL  Content WDL  Delusions None reported or observed  Perception WDL  Hallucination None reported or observed  Judgment Impaired  Confusion None  Danger to Self  Current suicidal ideation? Denies  Self-Injurious Behavior No self-injurious ideation or behavior indicators observed or expressed   Agreement Not to Harm Self Yes  Description of Agreement verbal contract for safety  Danger to Others  Danger to Others None reported or observed

## 2022-11-07 NOTE — Progress Notes (Signed)
[facility] PROGRESS NOTE  Lori [name] (DOB: 1971-04-14; MRN [id number]) is a 52 y.o. female with a history of HIV, polysubstance use, bipolar disorder, PTSD who presented to the [facility] 5/20 with complaints of suicidal ideation and headache, subsequently admitted voluntarily to [facility]. Due to ongoing sinus bradycardia and HTN, cardiology was contacted and recommended no interventions. Hospitalist service was consulted for further recommendations.   A/P: HTN:  - Continue increased lisinopril dose of 10mg . Extensive outpatient records reviewed showing persistently elevated BPs in line with her current readings. Improved overall on lisinopril. Would stay on this dose and monitor. 152/85 this AM, not aiming to precipitously drop BP.  Sinus bradycardia: No symptoms/hypotension. This, too, is chronic and likely exaggerated in setting of cocaine withdrawal. - Avoid AV nodal blocking agents.   HIV disease:  - Continue biktarvy and regular follow up with Dr. [name], who is her de facto PCP.   Hypokalemia: Supplemented.  Tyrone Nine, MD [facility] [email or web address] 11/07/2022, 9:13 AM

## 2022-11-07 NOTE — [facility] Suicide Risk Assessment (Signed)
[facility] INPATIENT:  Family/Significant Other Suicide Prevention Education  Suicide Prevention Education:  Patient Refusal for Family/Significant Other Suicide Prevention Education: The patient Lori [name] has refused to provide written consent for family/significant other to be provided Family/Significant Other Suicide Prevention Education during admission and/or prior to discharge.  Physician notified.  Malachy Chamber Hurschel Paynter 11/07/2022, 12:18 PM

## 2022-11-07 NOTE — Progress Notes (Addendum)
[facility] MD Progress Note  11/07/2022 7:31 AM Lori [name]  MRN:  [id number]  Principal Problem: Bipolar disorder, unspecified (HCC) Diagnosis: Principal Problem:   Bipolar disorder, unspecified (HCC) Active Problems:   Hypertension   Cocaine abuse (HCC)   Cannabis use disorder   Chronic post-traumatic stress disorder (PTSD)   Generalized anxiety disorder  Reason for Admission:  The patient is a 52 year old female with a past psychiatric history of bipolar disorder, unspecified, as well as PTSD, both of which were diagnosed at her previous hospitalization at the behavioral health hospital in December 2023.  The patient presented for care at the emergency department with suicidal thoughts.  She is admitted to the [name] on a voluntary basis.    Information obtained from 24-hour nursing report: TR[facility] continues following remotely. Anxious 5/25 afternoon   Information Obtained Today During Patient Interview:  Patient reported feeling "anxious", anticipating discharge on 5/27. However she has been able to use and prns to cope through the anxiety.  At home stated that she would walk away, being with her bunnies, going fishing, talking to mom to cope to stressor of brother at home. Otherwise, she looks forward to returning home. Sleep and appetite remain stable and appropriate, her energy level is good.  Discussed patient about her elevated Cr, to avoid NSAIDs and try tylenol instead, but was ok with one time dose of ibuprofen if tylenol prn isn't helping. Encouraged PO fluid intake. Instructed to limit NSAID use at home and to inc PO fluid.   Denied side effects to current medications.  Denied active and passive SI/HI, AVH, paranoia.  Total Time spent with patient: 20 min  Past Psychiatric History: as per H and P  Past Medical History:  Past Medical History:  Diagnosis Date   Abnormal Pap smear    Had Cryo to treat   Allergy    Anemia    Anxiety    Cluster  headaches    HIV infection (HCC)    Lung nodule    Vaginal venereal warts     Past Surgical History:  Procedure Laterality Date   ABDOMINAL HYSTERECTOMY     KNEE ARTHROSCOPY Left    bakers cyst removal   TUBAL LIGATION     Family History:  Family History  Problem Relation Age of Onset   CAD Mother        MI 2016   Uterine cancer Mother    Cervical cancer Mother    Colon cancer Mother    Lung cancer Maternal Grandmother    Cancer Maternal Grandfather        larynx   Prostate cancer Maternal Grandfather    Prostate cancer Father    Breast cancer Neg Hx    Liver disease Neg Hx    Esophageal cancer Neg Hx    Stomach cancer Neg Hx    Colon polyps Neg Hx    Family Psychiatric  History: as per H and P Social History:  Social History   Substance and Sexual Activity  Alcohol Use Yes   Alcohol/week: 0.0 standard drinks of alcohol   Comment: "a lot every day"      Social History   Substance and Sexual Activity  Drug Use Yes   Frequency: 7.0 times per week   Types: Marijuana, Cocaine   Comment: marijuana daily, cocaine as needed    Social History   Socioeconomic History   Marital status: Single    Spouse name: Not on file   Number  of children: Not on file   Years of education: Not on file   Highest education level: Not on file  Occupational History   Not on file  Tobacco Use   Smoking status: Every Day    Packs/day: 1.00    Years: 27.00    Additional pack years: 0.00    Total pack years: 27.00    Types: Cigarettes   Smokeless tobacco: Never   Tobacco comments:    patient not ready to quit; considering patches  Vaping Use   Vaping Use: Never used  Substance and Sexual Activity   Alcohol use: Yes    Alcohol/week: 0.0 standard drinks of alcohol    Comment: "a lot every day"    Drug use: Yes    Frequency: 7.0 times per week    Types: Marijuana, Cocaine    Comment: marijuana daily, cocaine as needed   Sexual activity: Yes    Birth control/protection:  Surgical, Condom    Comment: pt. declined condoms  Other Topics Concern   Not on file  Social History Narrative   Not on file   Social Determinants of Health   Financial Resource Strain: Low Risk  (02/04/2020)   Overall Financial Resource Strain (CARDIA)    Difficulty of Paying Living Expenses: Not very hard  Food Insecurity: No Food Insecurity (11/02/2022)   Hunger Vital Sign    Worried About Running Out of Food in the Last Year: Never true    Ran Out of Food in the Last Year: Never true  Transportation Needs: Unmet Transportation Needs (11/02/2022)   PRAPARE - Transportation    Lack of Transportation (Medical): Yes    Lack of Transportation (Non-Medical): Yes  Physical Activity: Inactive (02/04/2020)   Exercise Vital Sign    Days of Exercise per Week: 0 days    Minutes of Exercise per Session: 0 min  Stress: Stress Concern Present (02/04/2020)   [employer] of Occupational Health - Occupational Stress Questionnaire    Feeling of Stress : Very much  Social Connections: Moderately Isolated (02/04/2020)   Social Connection and Isolation Panel [NHANES]    Frequency of Communication with Friends and Family: More than three times a week    Frequency of Social Gatherings with Friends and Family: Never    Attends Religious Services: 1 to 4 times per year    Active Member of [employer] or Organizations: No    Attends Engineer, structural: Never    Marital Status: Never married   Sleep: per above  Appetite: per above  Current Medications: Current Facility-Administered Medications  Medication Dose Route Frequency Provider Last Rate Last Admin   bictegravir-emtricitabine-tenofovir AF (BIKTARVY) 50-200-25 MG per tablet 1 tablet  1 tablet Oral Daily Massengill, Harrold Donath, MD   1 tablet at 11/06/22 0806   haloperidol (HALDOL) tablet 5 mg  5 mg Oral TID PRN Phineas Inches, MD       And   LORazepam (ATIVAN) tablet 2 mg  2 mg Oral TID PRN Phineas Inches, MD       And    diphenhydrAMINE (BENADRYL) capsule 50 mg  50 mg Oral TID PRN Massengill, Harrold Donath, MD       haloperidol lactate (HALDOL) injection 5 mg  5 mg Intramuscular TID PRN Massengill, Harrold Donath, MD       And   LORazepam (ATIVAN) injection 2 mg  2 mg Intramuscular TID PRN Massengill, Harrold Donath, MD       And   diphenhydrAMINE (BENADRYL) injection 50 mg  50  mg Intramuscular TID PRN Massengill, Harrold Donath, MD       hydrOXYzine (ATARAX) tablet 25 mg  25 mg Oral TID PRN Phineas Inches, MD   25 mg at 11/06/22 2100   lisinopril (ZESTRIL) tablet 10 mg  10 mg Oral Daily Carlyn Reichert, MD   10 mg at 11/06/22 0806   lurasidone (LATUDA) tablet 20 mg  20 mg Oral Q supper Carlyn Reichert, MD   20 mg at 11/06/22 1714   modafinil (PROVIGIL) tablet 100 mg  100 mg Oral Daily Carlyn Reichert, MD   100 mg at 11/06/22 0981   nicotine (NICODERM CQ - dosed in mg/24 hours) patch 14 mg  14 mg Transdermal Daily Massengill, Harrold Donath, MD   14 mg at 11/06/22 0809   traZODone (DESYREL) tablet 50 mg  50 mg Oral QHS PRN Phineas Inches, MD   50 mg at 11/06/22 2101    Lab Results:  Results for orders placed or performed during the hospital encounter of 11/02/22 (from the past 48 hour(s))  Basic metabolic panel     Status: Abnormal   Collection Time: 11/06/22  6:24 AM  Result Value Ref Range   Sodium 141 135 - 145 mmol/L   Potassium 4.0 3.5 - 5.1 mmol/L   Chloride 108 98 - 111 mmol/L   CO2 27 22 - 32 mmol/L   Glucose, Bld 92 70 - 99 mg/dL    Comment: Glucose reference range applies only to samples taken after fasting for at least 8 hours.   BUN 20 6 - 20 mg/dL   Creatinine, Ser 1.91 (H) 0.44 - 1.00 mg/dL   Calcium 8.9 8.9 - 47.8 mg/dL   GFR, Estimated >29 >56 mL/min    Comment: (NOTE) Calculated using the CKD-EPI Creatinine Equation (2021)    Anion gap 6 5 - 15    Comment: Performed at [facility], [address], [place], [place] [address]    Blood Alcohol level:  Lab Results  Component Value Date   ETH  <10 05/15/2022   ETH 133 (H) 11/14/2020    Metabolic Disorder Labs: Lab Results  Component Value Date   HGBA1C 5.6 05/17/2022   MPG 114 05/17/2022   No results found for: "PROLACTIN" Lab Results  Component Value Date   CHOL 195 05/17/2022   TRIG 182 (H) 05/17/2022   HDL 38 (L) 05/17/2022   CHOLHDL 5.1 05/17/2022   VLDL 36 05/17/2022   LDLCALC 121 (H) 05/17/2022   LDLCALC 172 (H) 10/30/2020    Physical Findings:   Psychiatric Specialty Exam: Physical Exam Constitutional:      Appearance: the patient is not toxic-appearing.  Pulmonary:     Effort: Pulmonary effort is normal.  Neurological:     General: No focal deficit present.     Mental Status: the patient is alert and oriented to person, place, and time.   Review of Systems  Respiratory:  Negative for shortness of breath.   Cardiovascular:  Negative for chest pain.  Gastrointestinal:  Negative for abdominal pain, constipation, diarrhea, nausea and vomiting.  Neurological:  Negative for headaches.      BP (!) 152/85 (BP Location: Left Arm)   Pulse (!) 49   Temp 97.7 F (36.5 C) (Oral)   Resp 20   Ht 5\' 4"  (1.626 m)   Wt 86.2 kg   SpO2 100%   BMI 32.61 kg/m   General Appearance: Fairly Groomed  Eye Contact:  Good  Speech:  Clear and Coherent  Volume:  Normal  Mood:  Mild anxiety  Affect:  Congruent  Thought Process:  Coherent  Orientation:  Full (Time, Place, and Person)  Thought Content: Logical   Suicidal Thoughts:  No  Homicidal Thoughts:  No  Memory:  Immediate;   Good  Judgement:  fair  Insight:  fair  Psychomotor Activity:  Normal  Concentration:  Concentration: Good  Recall:  Good  Fund of Knowledge: Good  Language: Good  Akathisia:  No  Handed:  not assessed  AIMS (if indicated): not done  Assets:  Communication Skills Desire for Improvement Financial Resources/Insurance Housing Leisure Time Physical Health  ADL's:  Intact  Cognition: WNL  Sleep:  Good      Treatment Plan  Summary: Daily contact with patient to assess and evaluate symptoms and progress in treatment and Medication management  Diagnoses / Active Problems: # Bipolar disorder, unspecified-current episode depressed #Chronic PTSD #GAD #Cocaine use disorder #Cannabis use disorder #Alcohol use disorder #Tobacco use disorder     PLAN: Safety and Monitoring:             -- Voluntary admission to inpatient psychiatric unit for safety, stabilization and treatment             -- Daily contact with patient to assess and evaluate symptoms and progress in treatment             -- Patient's case to be discussed in multi-disciplinary team meeting             -- Observation Level : q15 minute checks             -- Vital signs:  q12 hours             -- Precautions: suicide, elopement, and assault   2. Psychiatric Diagnoses and Treatment:  -Patient has no home psychiatric medications - Discontinue clonidine. This medication was restarted even though she is not taking it at home.  They may be causing bradycardia (see further discussion below) - Continued Latuda 20 mg q. supper for bipolar depression and mood stabilization (started on 5/22) - Continued Provigil 100 mg daily for bipolar depression (started on 5/23) - Will consider adding Lexapro in the coming days              3. Medical Issues Being Addressed:              Labs review notable for urine drug screen positive for cocaine, benzodiazepines, and marijuana. - CIWA for 3 days because of alcohol use   Bradycardia: EKG on admission was sinus bradycardia.  The patient is asymptomatic.  Consulted with cardiology, Dr. Allyson Sabal, who was not concerned as long as her pulse remains above 50 bpm.  Over the past 12 hours the patient has had a heart rate in the 40s.  Discussed with cardiology today (Dr Izora Ribas), who stated that this also is not problematic because she is asymptomatic.  He sees no indication for pacemaker placement or outpatient cardiac  evaluation.  Will obtain hospitalist recommendation for plan of care regarding blood pressure control, which was the recommendation of the cardiologist.  - See hospitalist note for full details. In brief, he recommends lisinopril 10 mg daily and vital signs every 8 hours, with the first 1 being a manual blood pressure.  TRH repleated K.               Tobacco Use Disorder             -- Nicotine patch  21mg /24 hours ordered             -- Smoking cessation encouraged - Patient requests nicotine patch prescription as well as gum on discharge.  She reports a desire to stop smoking.   4. Discharge Planning:              -- Social work and case management to assist with discharge planning and identification of hospital follow-up needs prior to discharge             -- Estimated LOS: 5-7 days             -- Discharge Concerns: Need to establish a safety plan; Medication compliance and effectiveness             -- Discharge Goals: Return home with outpatient referrals for mental health follow-up including medication management/psychotherapy  Princess Bruins, DO PGY-2

## 2022-11-08 ENCOUNTER — Encounter (HOSPITAL_COMMUNITY): Payer: Self-pay

## 2022-11-08 DIAGNOSIS — F3160 Bipolar disorder, current episode mixed, unspecified: Secondary | ICD-10-CM

## 2022-11-08 LAB — BASIC METABOLIC PANEL
Anion gap: 7 (ref 5–15)
BUN: 18 mg/dL (ref 6–20)
CO2: 26 mmol/L (ref 22–32)
Calcium: 8.9 mg/dL (ref 8.9–10.3)
Chloride: 107 mmol/L (ref 98–111)
Creatinine, Ser: 0.88 mg/dL (ref 0.44–1.00)
GFR, Estimated: >60 mL/min (ref >60)
Glucose, Bld: 97 mg/dL (ref 70–99)
Potassium: 4.2 mmol/L (ref 3.5–5.1)
Sodium: 140 mmol/L (ref 135–145)

## 2022-11-08 MED ORDER — NICOTINE 14 MG/24HR TD PT24
14.0000 mg | MEDICATED_PATCH | Freq: Every day | TRANSDERMAL | 0 refills | Status: DC
  Filled 2022-11-08: qty 28, 28d supply, fill #0

## 2022-11-08 MED ORDER — HYDROXYZINE HCL 25 MG PO TABS
25.0000 mg | ORAL_TABLET | Freq: Three times a day (TID) | ORAL | 0 refills | Status: AC | PRN
  Filled 2022-11-08: qty 30, 10d supply, fill #0

## 2022-11-08 MED ORDER — MODAFINIL 100 MG PO TABS: 100.0000 mg | ORAL_TABLET | Freq: Every day | ORAL | 0 refills | Status: DC

## 2022-11-08 MED ORDER — LURASIDONE HCL 20 MG PO TABS
20.0000 mg | ORAL_TABLET | Freq: Every day | ORAL | 0 refills | Status: DC
  Filled 2022-11-08: qty 30, 30d supply, fill #0

## 2022-11-08 MED ORDER — LISINOPRIL 10 MG PO TABS
10.0000 mg | ORAL_TABLET | Freq: Every day | ORAL | 0 refills | Status: DC
  Filled 2022-11-08: qty 30, 30d supply, fill #0

## 2022-11-08 MED ORDER — TRAZODONE HCL 50 MG PO TABS
50.0000 mg | ORAL_TABLET | Freq: Every evening | ORAL | 0 refills | Status: DC | PRN
  Filled 2022-11-08: qty 30, 30d supply, fill #0

## 2022-11-08 NOTE — Progress Notes (Signed)
   11/07/22 2357  Psych Admission Type (Psych Patients Only)  Admission Status Voluntary  Psychosocial Assessment  Patient Complaints Anxiety  Eye Contact Fair  Facial Expression Anxious  Affect Appropriate to circumstance  Speech Logical/coherent  Interaction Assertive  Motor Activity Other (Comment) (WD:)  Appearance/Hygiene Unremarkable  Behavior Characteristics Appropriate to situation  Mood Anxious;Pleasant  Thought Process  Coherency WDL  Content WDL  Delusions None reported or observed  Perception WDL  Hallucination None reported or observed  Judgment Impaired  Confusion None  Danger to Self  Current suicidal ideation? Denies  Self-Injurious Behavior No self-injurious ideation or behavior indicators observed or expressed   Agreement Not to Harm Self Yes  Description of Agreement verbal  Danger to Others  Danger to Others None reported or observed

## 2022-11-08 NOTE — Progress Notes (Signed)
  [facility] Case Management Discharge Plan :  Will you be returning to the same living situation after discharge:  Yes,  Pt will return to her home  where she resides on a shared  property with her parents. At discharge, do you have transportation home?: Yes,  Family Member Do you have the ability to pay for your medications: Yes,  Insured  Release of information consent forms completed and in the chart;  Patient's signature needed at discharge.  Patient to Follow up at:  Follow-up Information     [facility] Follow up on 11/10/2022.   Why: You are scheduled for an appointment with Susa Day on May 29th  at 11:00am.  Please bring insurance card, social sceurity card, photo id, any income info and discharge paperwork with you to your appointment. Contact information: [address] [place] [address] [phone]                 Next level of care provider has access to [facility] Link:yes  Safety Planning and Suicide Prevention discussed: Yes,  with Mother [name]     Has patient been referred to the Quitline?: Yes, faxed/e-referral on 11-08-2022  Patient has been referred for addiction treatment: Yes, the patient will follow up with an outpatient provider for substance use disorder. Psychiatrist/APP: appointment made and Therapist: appointment made with Day Creig Hines. Patient to continue working towards treatment goals after discharge. Patient no longer meets criteria for inpatient criteria per attending physician. Continue taking medications as prescribed, nursing to provide instructions at discharge. Follow up with all scheduled appointments.    Quintin Hjort S Edon Hoadley, LCSW 11/08/2022, 10:10 AM

## 2022-11-08 NOTE — Discharge Instructions (Signed)
-  Follow-up with your outpatient psychiatric provider -instructions on appointment date, time, and address (location) are provided to you in discharge paperwork.  -Take your psychiatric medications as prescribed at discharge - instructions are provided to you in the discharge paperwork  -Follow-up with outpatient primary care doctor and other specialists -for management of preventative medicine and any chronic medical disease.  -Recommend abstinence from alcohol, tobacco, and other illicit drug use at discharge.   -If your psychiatric symptoms recur, worsen, or if you have side effects to your psychiatric medications, call your outpatient psychiatric provider, 911, [phone] or go to the nearest emergency department.  -If suicidal thoughts occur, call your outpatient psychiatric provider, 911, [phone] or go to the nearest emergency department.  Naloxone (Narcan) can help reverse an overdose when given to the victim quickly.  [place] offers free naloxone kits and instructions/training on its use.  Add naloxone to your first aid kit and you can help save a life.   Pick up your free kit at the following locations:   [place]:  [facility], [address] [place] [address] ([phone]) [facility] [address] [place] [id number] ([phone]) [facility] [address] [place] [address]  [place]: [facility] [address] [place] [address] ([phone]) [facility] [address] [place] [address] ([phone])  

## 2022-11-08 NOTE — Discharge Summary (Signed)
Physician Discharge Summary Note  Patient:  Lori [name] is an 52 y.o., female MRN:  [id number] DOB:  02/27/1971 Patient phone:  [phone] (home)  Patient address:   [address] [place] [address],  Total Time spent with patient: 15 min  Date of Admission:  11/02/2022 Date of Discharge: 11/08/2022  Reason for Admission:   The patient is a 52 year old female with a past psychiatric history of bipolar disorder, unspecified, as well as PTSD, both of which were diagnosed at her previous hospitalization at the behavioral health hospital in December 2023. The patient presented for care at the emergency department with suicidal thoughts. She is admitted to the [facility] behavioral hospital on a voluntary basis.   Principal Problem: Bipolar disorder, unspecified (HCC) Discharge Diagnoses: Principal Problem:   Bipolar disorder, unspecified (HCC) Active Problems:   Hypertension   Cocaine abuse (HCC)   Cannabis use disorder   Chronic post-traumatic stress disorder (PTSD)   Generalized anxiety disorder    Past Psychiatric History: Previous Psych Diagnoses: As above, made at the hospitalization described below Prior inpatient treatment: Patient reports only 1 prior inpatient psychiatric hospitalization, occurring in December of last year. Current/prior outpatient treatment: The patient denies following with any psychiatric provider or therapist previously Prior rehab hx: Denies Psychotherapy hx: Denies History of suicide: Patient reports a suicide attempt at 3 times in her life, the most recent being in 2020 when she took an overdose of lisinopril and threw up the medication, not seeking medical care afterwards. History of homicide or aggression: Denies Psychiatric medication history: Patient reports failed trial of Neurontin for anxiety, previous documentation of Celexa and Xanax, both of which were not helpful Psychiatric medication compliance history: Poor Neuromodulation  history: None Current Psychiatrist: None Current therapist: None  Past Medical History:  Past Medical History:  Diagnosis Date   Abnormal Pap smear    Had Cryo to treat   Allergy    Anemia    Anxiety    Cluster headaches    HIV infection (HCC)    Lung nodule    Vaginal venereal warts     Past Surgical History:  Procedure Laterality Date   ABDOMINAL HYSTERECTOMY     KNEE ARTHROSCOPY Left    bakers cyst removal   TUBAL LIGATION     Family History:  Family History  Problem Relation Age of Onset   CAD Mother        MI 2016   Uterine cancer Mother    Cervical cancer Mother    Colon cancer Mother    Lung cancer Maternal Grandmother    Cancer Maternal Grandfather        larynx   Prostate cancer Maternal Grandfather    Prostate cancer Father    Breast cancer Neg Hx    Liver disease Neg Hx    Esophageal cancer Neg Hx    Stomach cancer Neg Hx    Colon polyps Neg Hx    Family Psychiatric  History: See H and P Social History:  Social History   Substance and Sexual Activity  Alcohol Use Yes   Alcohol/week: 0.0 standard drinks of alcohol   Comment: "a lot every day"      Social History   Substance and Sexual Activity  Drug Use Yes   Frequency: 7.0 times per week   Types: Marijuana, Cocaine   Comment: marijuana daily, cocaine as needed    Social History   Socioeconomic History   Marital status: Single  Spouse name: Not on file   Number of children: Not on file   Years of education: Not on file   Highest education level: Not on file  Occupational History   Not on file  Tobacco Use   Smoking status: Every Day    Packs/day: 1.00    Years: 27.00    Additional pack years: 0.00    Total pack years: 27.00    Types: Cigarettes   Smokeless tobacco: Never   Tobacco comments:    patient not ready to quit; considering patches  Vaping Use   Vaping Use: Never used  Substance and Sexual Activity   Alcohol use: Yes    Alcohol/week: 0.0 standard drinks of alcohol     Comment: "a lot every day"    Drug use: Yes    Frequency: 7.0 times per week    Types: Marijuana, Cocaine    Comment: marijuana daily, cocaine as needed   Sexual activity: Yes    Birth control/protection: Surgical, Condom    Comment: pt. declined condoms  Other Topics Concern   Not on file  Social History Narrative   Not on file   Social Determinants of Health   Financial Resource Strain: Low Risk  (02/04/2020)   Overall Financial Resource Strain (CARDIA)    Difficulty of Paying Living Expenses: Not very hard  Food Insecurity: No Food Insecurity (11/02/2022)   Hunger Vital Sign    Worried About Running Out of Food in the Last Year: Never true    Ran Out of Food in the Last Year: Never true  Transportation Needs: Unmet Transportation Needs (11/02/2022)   PRAPARE - Transportation    Lack of Transportation (Medical): Yes    Lack of Transportation (Non-Medical): Yes  Physical Activity: Inactive (02/04/2020)   Exercise Vital Sign    Days of Exercise per Week: 0 days    Minutes of Exercise per Session: 0 min  Stress: Stress Concern Present (02/04/2020)   [employer] of Occupational Health - Occupational Stress Questionnaire    Feeling of Stress : Very much  Social Connections: Moderately Isolated (02/04/2020)   Social Connection and Isolation Panel [NHANES]    Frequency of Communication with Friends and Family: More than three times a week    Frequency of Social Gatherings with Friends and Family: Never    Attends Religious Services: 1 to 4 times per year    Active Member of [employer] or Organizations: No    Attends Banker Meetings: Never    Marital Status: Never married    Hospital Course:   During the patient's hospitalization, patient had extensive initial psychiatric evaluation, and follow-up psychiatric evaluations every day.   Upon evaluation, psychiatric diagnoses were given as follows:  # Bipolar disorder, unspecified-current episode depressed #Chronic  PTSD #GAD #Cocaine use disorder #Cannabis use disorder #Alcohol use disorder #Tobacco use disorder   Patient's psychiatric medications were adjusted on admission:  - Patient has no home psychiatric medications - Discontinue clonidine.  This medication was restarted even though she is not taking it at home.  It may be causing bradycardia. - Start Latuda 20 mg q. supper for bipolar depression and mood stabilization - Start Provigil 100 mg daily for bipolar depression -- Continue home lisinopril 5 mg daily -- Continue home Biktarvy   During the hospitalization, other adjustments were made to the patient's psychiatric medication regimen:  -- Home lisinopril increased to 10 mg daily due to hypertension. See notes from hospitalist team for full details.  Gradually, patient started adjusting to milieu.   Patient's care was discussed during the interdisciplinary team meeting every day during the hospitalization.   The patient denied having side effects to prescribed psychiatric medication.   The patient reports their target psychiatric symptoms of depression responded well to the psychiatric medications, and the patient reports overall benefit other psychiatric hospitalization. Supportive psychotherapy was provided to the patient. The patient also participated in regular group therapy while admitted.    Labs were reviewed with the patient, and abnormal results were discussed with the patient.   The patient denied having suicidal thoughts more than 48 hours prior to discharge.  Patient denies having homicidal thoughts.  Patient denies having auditory hallucinations.  Patient denies any visual hallucinations.  Patient denies having paranoid thoughts.   The patient is able to verbalize their individual safety plan to this provider.   It is recommended to the patient to continue psychiatric medications as prescribed, after discharge from the hospital.     It is recommended to the patient to  follow up with your outpatient psychiatric provider and PCP. Patient reports an appointment with her PCP scheduled for June 24th. Discussed her bradycardia and increased lisinopril for hypertension and the need for medical follow up for both issues.    Discussed with the patient, the impact of alcohol, drugs, tobacco have been there overall psychiatric and medical wellbeing, and total abstinence from substance use was recommended the patient.  Physical Findings: AIMS: 0  Psychiatric Specialty Exam: Physical Exam Constitutional:      Appearance: the patient is not toxic-appearing.  Pulmonary:     Effort: Pulmonary effort is normal.  Neurological:     General: No focal deficit present.     Mental Status: the patient is alert and oriented to person, place, and time.   Review of Systems  Respiratory:  Negative for shortness of breath.   Cardiovascular:  Negative for chest pain.  Gastrointestinal:  Negative for abdominal pain, constipation, diarrhea, nausea and vomiting.  Neurological:  Negative for headaches.      BP 139/87   Pulse 61   Temp 98.3 F (36.8 C) (Oral)   Resp 20   Ht 5\' 4"  (1.626 m)   Wt 86.2 kg   SpO2 97%   BMI 32.61 kg/m   General Appearance: Fairly Groomed  Eye Contact:  Good  Speech:  Clear and Coherent  Volume:  Normal  Mood:  Euthymic  Affect:  Congruent  Thought Process:  Coherent  Orientation:  Full (Time, Place, and Person)  Thought Content: Logical   Suicidal Thoughts:  No  Homicidal Thoughts:  No  Memory:  Immediate;   Good  Judgement:  fair  Insight:  fair  Psychomotor Activity:  Normal  Concentration:  Concentration: Good  Recall:  Good  Fund of Knowledge: Good  Language: Good  Akathisia:  No  Handed:  not assessed  AIMS (if indicated): not done  Assets:  Communication Skills Desire for Improvement Financial Resources/Insurance Housing Leisure Time Physical Health  ADL's:  Intact  Cognition: WNL  Sleep:  Fair      Social  History   Tobacco Use  Smoking Status Every Day   Packs/day: 1.00   Years: 27.00   Additional pack years: 0.00   Total pack years: 27.00   Types: Cigarettes  Smokeless Tobacco Never  Tobacco Comments   patient not ready to quit; considering patches   Tobacco Cessation:  A prescription for an FDA-approved tobacco cessation medication provided at  discharge   Blood Alcohol level:  Lab Results  Component Value Date   ETH <10 05/15/2022   ETH 133 (H) 11/14/2020    Metabolic Disorder Labs:  Lab Results  Component Value Date   HGBA1C 5.6 05/17/2022   MPG 114 05/17/2022   No results found for: "PROLACTIN" Lab Results  Component Value Date   CHOL 195 05/17/2022   TRIG 182 (H) 05/17/2022   HDL 38 (L) 05/17/2022   CHOLHDL 5.1 05/17/2022   VLDL 36 05/17/2022   LDLCALC 121 (H) 05/17/2022   LDLCALC 172 (H) 10/30/2020    See Psychiatric Specialty Exam and Suicide Risk Assessment completed by Attending Physician prior to discharge.  Discharge destination: self-care  Is patient on multiple antipsychotic therapies at discharge:  no Has Patient had three or more failed trials of antipsychotic monotherapy by history:  no  Recommended Plan for Multiple Antipsychotic Therapies: NA  Discharge Instructions     Diet - low sodium heart healthy   Complete by: As directed    Increase activity slowly   Complete by: As directed       Allergies as of 11/08/2022       Reactions   Doxycycline Swelling   Other Swelling   Mulberry fruit    Sulfonamide Derivatives Other (See Comments)   Childhood allergy.        Medication List     TAKE these medications      Indication  Biktarvy 50-200-25 MG Tabs tablet Generic drug: bictegravir-emtricitabine-tenofovir AF Take 1 tablet by mouth daily.  Indication: HIV Disease   docusate sodium 100 MG capsule Commonly known as: COLACE Take 1 capsule (100 mg total) by mouth 2 (two) times daily.  Indication: Constipation   fluticasone  50 MCG/ACT nasal spray Commonly known as: Flonase Place 2 sprays into both nostrils daily.  Indication: Stuffy Nose   hydrOXYzine 25 MG tablet Commonly known as: ATARAX Take 1 tablet (25 mg total) by mouth 3 (three) times daily as needed for anxiety.  Indication: Feeling Anxious   lisinopril 10 MG tablet Commonly known as: ZESTRIL Take 1 tablet (10 mg total) by mouth daily.  Indication: High Blood Pressure Disorder   lurasidone 20 MG Tabs tablet Commonly known as: LATUDA Take 1 tablet (20 mg total) by mouth daily with supper.  Indication: Depressive Phase of Manic-Depression   modafinil 100 MG tablet Commonly known as: PROVIGIL Take 1 tablet (100 mg total) by mouth daily.  Indication: Major Depressive Disorder   nicotine 14 mg/24hr patch Commonly known as: NICODERM CQ - dosed in mg/24 hours Place 1 patch (14 mg total) onto the skin daily.  Indication: Nicotine Addiction   traZODone 50 MG tablet Commonly known as: DESYREL Take 1 tablet (50 mg total) by mouth at bedtime as needed for sleep.  Indication: Major Depressive Disorder        Follow-up Information     [facility], [facility] Follow up on 11/10/2022.   Why: You are scheduled for an appointment with Susa Day on May 29th  at 11:00am.  Please bring insurance card, social sceurity card, photo id, any income info and discharge paperwork with you to your appointment. Contact information: [address] [place] [address] [phone]                 Follow-up recommendations:  Activity as tolerated. Diet as recommended by PCP. Keep all scheduled follow-up appointments as recommended.  Patient is instructed to take all prescribed medications as recommended. Report any  side effects or adverse reactions to your outpatient psychiatrist. Patient is instructed to abstain from alcohol and illegal drugs while on prescription medications. In the event of worsening symptoms, patient is instructed  to call the crisis hotline, 911, or go to the nearest emergency department for evaluation and treatment.  Prescriptions given at discharge. Patient agreeable to plan. Given opportunity to ask questions. Appears to feel comfortable with discharge.  Patient is also instructed prior to discharge to: Take all medications as prescribed by mental healthcare provider. Report any adverse effects and or reactions from the medicines to outpatient provider promptly. Patient has been instructed & cautioned: To not engage in alcohol and or illegal drug use while on prescription medicines. In the event of worsening symptoms,  patient is instructed to call the crisis hotline, 911 and or go to the nearest ED for appropriate evaluation and treatment of symptoms. To follow-up with primary care provider for other medical issues, concerns and or health care needs  The patient was evaluated each day by a clinical provider to ascertain response to treatment. Improvement was noted by the patient's report of decreasing symptoms, improved sleep and appetite, affect, medication tolerance, behavior, and participation in unit programming.  Patient was asked each day to complete a self inventory noting mood, mental status, pain, new symptoms, anxiety and concerns.  Patient responded well to medication and being in a therapeutic and supportive environment. Positive and appropriate behavior was noted and the patient was motivated for recovery. The patient worked closely with the treatment team and case manager to develop a discharge plan with appropriate goals. Coping skills, problem solving as well as relaxation therapies were also part of the unit programming.  By the day of discharge patient was in much improved condition than upon admission.  Symptoms were reported as significantly decreased or resolved completely. The patient was motivated to continue taking medication with a goal of continued improvement in mental health.     Comments:  As above  Signed: Carlyn Reichert, MD PGY-2

## 2022-11-08 NOTE — [facility] Suicide Risk Assessment (Signed)
[facility] INPATIENT:  Family/Significant Other Suicide Prevention Education  Suicide Prevention Education:  Education Completed; 11-08-2022,  Lori [name] has been identified by the patient as the family member/significant other with whom the patient will be residing, and identified as the person(s) who will aid the patient in the event of a mental health crisis (suicidal ideations/suicide attempt).  With written consent from the patient, the family member/significant other has been provided the following suicide prevention education, prior to the and/or following the discharge of the patient.  The suicide prevention education provided includes the following: Suicide risk factors Suicide prevention and interventions National Suicide Hotline telephone number [facility] assessment telephone number [facility] Emergency Assistance 911 [facility] and/or Residential Mobile Crisis Unit telephone number  Request made of family/significant other to: Remove weapons (e.g., guns, rifles, knives), all items previously/currently identified as safety concern.   Remove drugs/medications (over-the-counter, prescriptions, illicit drugs), all items previously/currently identified as a safety concern.  The family member/significant other verbalizes understanding of the suicide prevention education information provided.  The family member/significant other agrees to remove the items of safety concern listed above.  Jerah Esty S Amber Williard 11/08/2022, 10:16 AM

## 2022-11-08 NOTE — BHH Suicide Risk Assessment (Signed)
[facility] Discharge Suicide Risk Assessment   Principal Problem: Bipolar disorder, unspecified (HCC) Discharge Diagnoses: Principal Problem:   Bipolar disorder, unspecified (HCC) Active Problems:   Hypertension   Cocaine abuse (HCC)   Cannabis use disorder   Chronic post-traumatic stress disorder (PTSD)   Generalized anxiety disorder    Total Time spent with patient: 15 min   During the patient's hospitalization, patient had extensive initial psychiatric evaluation, and follow-up psychiatric evaluations every day.  Upon evaluation, psychiatric diagnoses were given as follows:  # Bipolar disorder, unspecified-current episode depressed #Chronic PTSD #GAD #Cocaine use disorder #Cannabis use disorder #Alcohol use disorder #Tobacco use disorder  Patient's psychiatric medications were adjusted on admission:  - Patient has no home psychiatric medications - Discontinue clonidine.  This medication was restarted even though she is not taking it at home.  It may be causing bradycardia. - Start Latuda 20 mg q. supper for bipolar depression and mood stabilization - Start Provigil 100 mg daily for bipolar depression -- Continue home lisinopril 5 mg daily -- Continue home Biktarvy  During the hospitalization, other adjustments were made to the patient's psychiatric medication regimen:  -- Home lisinopril increased to 10 mg daily due to hypertension. See notes from hospitalist team for full details.  Gradually, patient started adjusting to milieu.   Patient's care was discussed during the interdisciplinary team meeting every day during the hospitalization.  The patient denied having side effects to prescribed psychiatric medication.  The patient reports their target psychiatric symptoms of depression responded well to the psychiatric medications, and the patient reports overall benefit other psychiatric hospitalization. Supportive psychotherapy was provided to the patient. The patient also  participated in regular group therapy while admitted.   Labs were reviewed with the patient, and abnormal results were discussed with the patient.  The patient denied having suicidal thoughts more than 48 hours prior to discharge.  Patient denies having homicidal thoughts.  Patient denies having auditory hallucinations.  Patient denies any visual hallucinations.  Patient denies having paranoid thoughts.  The patient is able to verbalize their individual safety plan to this provider.  It is recommended to the patient to continue psychiatric medications as prescribed, after discharge from the hospital.    It is recommended to the patient to follow up with your outpatient psychiatric provider and PCP. Patient reports an appointment with her PCP scheduled for June 24th. Discussed her bradycardia and increased lisinopril for hypertension and the need for medical follow up for both issues.   Discussed with the patient, the impact of alcohol, drugs, tobacco have been there overall psychiatric and medical wellbeing, and total abstinence from substance use was recommended the patient.    Psychiatric Specialty Exam: Physical Exam Constitutional:      Appearance: the patient is not toxic-appearing.  Pulmonary:     Effort: Pulmonary effort is normal.  Neurological:     General: No focal deficit present.     Mental Status: the patient is alert and oriented to person, place, and time.   Review of Systems  Respiratory:  Negative for shortness of breath.   Cardiovascular:  Negative for chest pain.  Gastrointestinal:  Negative for abdominal pain, constipation, diarrhea, nausea and vomiting.  Neurological:  Negative for headaches.      BP 139/87   Pulse 61   Temp 98.3 F (36.8 C) (Oral)   Resp 20   Ht 5\' 4"  (1.626 m)   Wt 86.2 kg   SpO2 97%   BMI 32.61 kg/m  General Appearance: Fairly Groomed  Eye Contact:  Good  Speech:  Clear and Coherent  Volume:  Normal  Mood:  Euthymic  Affect:   Congruent  Thought Process:  Coherent  Orientation:  Full (Time, Place, and Person)  Thought Content: Logical   Suicidal Thoughts:  No  Homicidal Thoughts:  No  Memory:  Immediate;   Good  Judgement:  fair  Insight:  fair  Psychomotor Activity:  Normal  Concentration:  Concentration: Good  Recall:  Good  Fund of Knowledge: Good  Language: Good  Akathisia:  No  Handed:  not assessed  AIMS (if indicated): not done  Assets:  Communication Skills Desire for Improvement Financial Resources/Insurance Housing Leisure Time Physical Health  ADL's:  Intact  Cognition: WNL  Sleep:  Fair     Mental Status Per Nursing Assessment::   On Admission:  Suicidal ideation indicated by patient  Demographic Factors:  Unemployed, on disability  Loss Factors: NA  Historical Factors: NA  Risk Reduction Factors:   Positive social support Coping skills Good therapeutic relationship  Continued Clinical Symptoms:  Depression, improving  Cognitive Features That Contribute To Risk:  None  Suicide Risk:  Mild: Suicidal ideation of limited frequency, intensity, duration, and specificity.  There are no identifiable plans, no associated intent, mild dysphoria and related symptoms, few other risk factors, and identifiable protective factors, including available and accessible social support.   Follow-up Information     Services, [facility] Follow up on 11/10/2022.   Why: You are scheduled for an appointment with Susa Day on May 29th  at 11:00am.  Please bring insurance card, social sceurity card, photo id, any income info and discharge paperwork with you to your appointment. Contact information: [address] [place] [address] [phone]                 Plan Of Care/Follow-up recommendations:  Activity as tolerated. Diet as recommended by PCP. Keep all scheduled follow-up appointments as recommended.  Patient is instructed to take all prescribed  medications as recommended. Report any side effects or adverse reactions to your outpatient psychiatrist. Patient is instructed to abstain from alcohol and illegal drugs while on prescription medications. In the event of worsening symptoms, patient is instructed to call the crisis hotline, 911, or go to the nearest emergency department for evaluation and treatment.  Prescriptions given at discharge. Patient agreeable to plan. Given opportunity to ask questions. Appears to feel comfortable with discharge.  Patient is also instructed prior to discharge to: Take all medications as prescribed by mental healthcare provider. Report any adverse effects and or reactions from the medicines to outpatient provider promptly. Patient has been instructed & cautioned: To not engage in alcohol and or illegal drug use while on prescription medicines. In the event of worsening symptoms,  patient is instructed to call the crisis hotline, 911 and or go to the nearest ED for appropriate evaluation and treatment of symptoms. To follow-up with primary care provider for other medical issues, concerns and or health care needs  The patient was evaluated each day by a clinical provider to ascertain response to treatment. Improvement was noted by the patient's report of decreasing symptoms, improved sleep and appetite, affect, medication tolerance, behavior, and participation in unit programming.  Patient was asked each day to complete a self inventory noting mood, mental status, pain, new symptoms, anxiety and concerns.  Patient responded well to medication and being in a therapeutic and supportive environment. Positive and appropriate behavior was  noted and the patient was motivated for recovery. The patient worked closely with the treatment team and case manager to develop a discharge plan with appropriate goals. Coping skills, problem solving as well as relaxation therapies were also part of the unit programming.  By the day of  discharge patient was in much improved condition than upon admission.  Symptoms were reported as significantly decreased or resolved completely. The patient was motivated to continue taking medication with a goal of continued improvement in mental health.     Carlyn Reichert, MD PGY-2

## 2022-11-08 NOTE — Group Note (Signed)
Date:  11/08/2022 Time:  11:46 AM  Group Topic/Focus:  Goals Group:   The focus of this group is to help patients establish daily goals to achieve during treatment and discuss how the patient can incorporate goal setting into their daily lives to aide in recovery.    Participation Level:  Active  Participation Quality:  Appropriate  Affect:  Appropriate  Cognitive:  Appropriate  Insight: Appropriate  Engagement in Group:  Engaged  Modes of Intervention:  Discussion  Additional Comments:    Beckie Busing 11/08/2022, 11:46 AM

## 2022-11-08 NOTE — BH IP Treatment Plan (Signed)
Interdisciplinary Treatment and Diagnostic Plan Update  11/08/2022 Time of Session: 9:45 AM (update) Lori [name] MRN: [id number]  Principal Diagnosis: Bipolar disorder, unspecified (HCC)  Secondary Diagnoses: Principal Problem:   Bipolar disorder, unspecified (HCC) Active Problems:   Hypertension   Cocaine abuse (HCC)   Cannabis use disorder   Chronic post-traumatic stress disorder (PTSD)   Generalized anxiety disorder   Current Medications:  Current Facility-Administered Medications  Medication Dose Route Frequency Provider Last Rate Last Admin   acetaminophen (TYLENOL) tablet 650 mg  650 mg Oral Q6H PRN Princess Bruins, DO   650 mg at 11/08/22 0754   bictegravir-emtricitabine-tenofovir AF (BIKTARVY) 50-200-25 MG per tablet 1 tablet  1 tablet Oral Daily Massengill, Harrold Donath, MD   1 tablet at 11/08/22 2841   haloperidol (HALDOL) tablet 5 mg  5 mg Oral TID PRN Phineas Inches, MD       And   LORazepam (ATIVAN) tablet 2 mg  2 mg Oral TID PRN Phineas Inches, MD       And   diphenhydrAMINE (BENADRYL) capsule 50 mg  50 mg Oral TID PRN Massengill, Harrold Donath, MD       haloperidol lactate (HALDOL) injection 5 mg  5 mg Intramuscular TID PRN Massengill, Harrold Donath, MD       And   LORazepam (ATIVAN) injection 2 mg  2 mg Intramuscular TID PRN Massengill, Harrold Donath, MD       And   diphenhydrAMINE (BENADRYL) injection 50 mg  50 mg Intramuscular TID PRN Massengill, Harrold Donath, MD       hydrOXYzine (ATARAX) tablet 25 mg  25 mg Oral TID PRN Phineas Inches, MD   25 mg at 11/07/22 2107   lisinopril (ZESTRIL) tablet 10 mg  10 mg Oral Daily Carlyn Reichert, MD   10 mg at 11/08/22 0752   lurasidone (LATUDA) tablet 20 mg  20 mg Oral Q supper Carlyn Reichert, MD   20 mg at 11/07/22 1723   modafinil (PROVIGIL) tablet 100 mg  100 mg Oral Daily Carlyn Reichert, MD   100 mg at 11/08/22 3244   nicotine (NICODERM CQ - dosed in mg/24 hours) patch 14 mg  14 mg Transdermal Daily Massengill, Harrold Donath, MD   14 mg at  11/08/22 0751   traZODone (DESYREL) tablet 50 mg  50 mg Oral QHS PRN Phineas Inches, MD   50 mg at 11/07/22 2107   PTA Medications: Medications Prior to Admission  Medication Sig Dispense Refill Last Dose   bictegravir-emtricitabine-tenofovir AF (BIKTARVY) 50-200-25 MG TABS tablet Take 1 tablet by mouth daily. 30 tablet 1    docusate sodium (COLACE) 100 MG capsule Take 1 capsule (100 mg total) by mouth 2 (two) times daily. 10 capsule 0    fluticasone (FLONASE) 50 MCG/ACT nasal spray Place 2 sprays into both nostrils daily. 15 g 2     Patient Stressors: Medication change or noncompliance   Substance abuse    Patient Strengths: General fund of knowledge  Motivation for treatment/growth  Supportive family/friends   Treatment Modalities: Medication Management, Group therapy, Case management,  1 to 1 session with clinician, Psychoeducation, Recreational therapy.   Physician Treatment Plan for Primary Diagnosis: Bipolar disorder, unspecified (HCC) Long Term Goal(s):     Short Term Goals:    Medication Management: Evaluate patient's response, side effects, and tolerance of medication regimen.  Therapeutic Interventions: 1 to 1 sessions, Unit Group sessions and Medication administration.  Evaluation of Outcomes: Adequate for Discharge  Physician Treatment Plan for Secondary Diagnosis: Principal Problem:  Bipolar disorder, unspecified (HCC) Active Problems:   Hypertension   Cocaine abuse (HCC)   Cannabis use disorder   Chronic post-traumatic stress disorder (PTSD)   Generalized anxiety disorder  Long Term Goal(s):     Short Term Goals:       Medication Management: Evaluate patient's response, side effects, and tolerance of medication regimen.  Therapeutic Interventions: 1 to 1 sessions, Unit Group sessions and Medication administration.  Evaluation of Outcomes: Adequate for Discharge   RN Treatment Plan for Primary Diagnosis: Bipolar disorder, unspecified (HCC) Long  Term Goal(s): Knowledge of disease and therapeutic regimen to maintain health will improve  Short Term Goals: Ability to remain free from injury will improve, Ability to verbalize frustration and anger appropriately will improve, Ability to participate in decision making will improve, Ability to verbalize feelings will improve, Ability to identify and develop effective coping behaviors will improve, and Compliance with prescribed medications will improve  Medication Management: RN will administer medications as ordered by provider, will assess and evaluate patient's response and provide education to patient for prescribed medication. RN will report any adverse and/or side effects to prescribing provider.  Therapeutic Interventions: 1 on 1 counseling sessions, Psychoeducation, Medication administration, Evaluate responses to treatment, Monitor vital signs and CBGs as ordered, Perform/monitor CIWA, COWS, AIMS and Fall Risk screenings as ordered, Perform wound care treatments as ordered.  Evaluation of Outcomes: Adequate for Discharge   LCSW Treatment Plan for Primary Diagnosis: Bipolar disorder, unspecified (HCC) Long Term Goal(s): Safe transition to appropriate next level of care at discharge, Engage patient in therapeutic group addressing interpersonal concerns.  Short Term Goals: Engage patient in aftercare planning with referrals and resources, Increase social support, Increase emotional regulation, Facilitate acceptance of mental health diagnosis and concerns, Identify triggers associated with mental health/substance abuse issues, and Increase skills for wellness and recovery  Therapeutic Interventions: Assess for all discharge needs, 1 to 1 time with Social worker, Explore available resources and support systems, Assess for adequacy in community support network, Educate family and significant other(s) on suicide prevention, Complete Psychosocial Assessment, Interpersonal group  therapy.  Evaluation of Outcomes: Adequate for Discharge   Progress in Treatment: Attending groups: Yes. Participating in groups: Yes. Taking medication as prescribed: Yes. Toleration medication: Yes Family/Significant other contact made: PT DECLINED  Patient understands diagnosis: Yes. Discussing patient identified problems/goals with staff: Yes. Medical problems stabilized or resolved: Yes. Denies suicidal/homicidal ideation: Yes. Issues/concerns per patient self-inventory: No.     New problem(s) identified: No, Describe:  None Reported    New Short Term/Long Term Goal(s):  medication stabilization, elimination of SI thoughts, development of comprehensive mental wellness plan.     Patient Goals:  " Get on new medications that will work for my depression "    Discharge Plan or Barriers: Patient recently admitted. CSW will continue to follow and assess for appropriate referrals and possible discharge planning.     Reason for Continuation of Hospitalization: Anxiety Depression Medication stabilization Suicidal ideation   Estimated Length of Stay: PT is discharging today    Last 3 [place] Suicide Severity Risk Score: Flowsheet Row [facility] (Current) [facility] from 11[facility]01/2022 in [facility] at [facility] ED from 08/19/2022 in [facility] at [facility] RISK CATEGORY High Risk High Risk No Risk       Last PHQ 2/9 Scores:    08/28/2021   10:32 AM 11/27/2020    3:34 PM 10/30/2020  3:30 PM  Depression screen PHQ 2/9  Decreased Interest 0 0 0  Down, Depressed, Hopeless 1 0 0  PHQ - 2 Score 1 0 0    Scribe for Treatment Team: Beather Arbour 11/08/2022 9:57 AM

## 2022-11-08 NOTE — Progress Notes (Signed)
Pt discharged to lobby. Pt was stable and appreciative at that time. All papers, samples, and prescriptions were given and valuables returned. Verbal understanding expressed. Denies SI/HI and A/VH. Pt given opportunity to express concerns and ask questions. 

## 2022-11-08 NOTE — Group Note (Signed)
Recreation Therapy Group Note   Group Topic:Other  Group Date: 11/08/2022 Start Time: 0935 End Time: 1005 Facilitators: Maley Venezia-McCall, LRT,CTRS Location: [address] Dayroom   Goal Area(s) Addresses:  Patient will work together to answer trivia questions.   Patient will be respectful of others throughout activity.  Group Description: Music Trivia.  Patients were partnered up compete in activity.  LRT read trivia questions that covered different styles, types and genres of music.  The team with the highest score wins the game.    Affect/Mood: Appropriate   Participation Level: Engaged   Participation Quality: Independent   Behavior: Appropriate   Speech/Thought Process: Focused   Insight: Good   Judgement: Good   Modes of Intervention: Competitive Play   Patient Response to Interventions:  Engaged   Education Outcome:  Acknowledges education   Clinical Observations/Individualized Feedback: Pt was engaged and worked well with peer in completing the activity.     Plan: Continue to engage patient in RT group sessions 2-3x/week.   Lori [name], LRT,CTRS  11/08/2022 11:35 AM

## 2022-11-08 NOTE — Progress Notes (Addendum)
D. Pt has been appropriate on the unit- anxious, but friendly during interactions. Pt has been visible on the unit, observed interacting well with peers and attending groups. Pt's stated goal is "to work with my team in order to go home."  Per pt's self inventory, pt rated her depression,hopelessness and anxiety a 3/1/8, respectively. Pt denies SI/HI and AVH  A. Labs and vitals monitored. Pt given and educated on medications. Pt supported emotionally and encouraged to express concerns and ask questions.   R. Pt remains safe with 15 minute checks. Will continue POC.    11/08/22 0800  Psych Admission Type (Psych Patients Only)  Admission Status Voluntary  Psychosocial Assessment  Patient Complaints Anxiety  Eye Contact Fair  Facial Expression Anxious  Affect Appropriate to circumstance  Speech Logical/coherent  Interaction Assertive  Motor Activity Other (Comment) (steady gait)  Appearance/Hygiene Unremarkable  Behavior Characteristics Cooperative;Appropriate to situation  Mood Anxious;Pleasant  Thought Process  Coherency WDL  Content WDL  Delusions None reported or observed  Perception WDL  Hallucination None reported or observed  Judgment Impaired  Confusion None  Danger to Self  Current suicidal ideation? Denies  Self-Injurious Behavior No self-injurious ideation or behavior indicators observed or expressed   Agreement Not to Harm Self Yes  Description of Agreement verbal contract for safety  Danger to Others  Danger to Others None reported or observed

## 2022-11-09 ENCOUNTER — Other Ambulatory Visit: Payer: Self-pay

## 2022-11-09 ENCOUNTER — Other Ambulatory Visit (HOSPITAL_COMMUNITY): Payer: Self-pay

## 2022-11-09 ENCOUNTER — Encounter: Payer: Self-pay | Admitting: Pharmacist

## 2022-11-10 ENCOUNTER — Other Ambulatory Visit: Payer: Self-pay

## 2022-11-16 ENCOUNTER — Telehealth (HOSPITAL_COMMUNITY): Payer: Self-pay

## 2022-11-16 NOTE — Telephone Encounter (Signed)
Fax received form patients pharmacy, she was inpatient and discharged on 11/08/2022 and prescribed Provigil - patients insurance prefers Armodafinil, if the change is appropriate please send new prescription to the pharmacy, thank you

## 2022-11-23 ENCOUNTER — Other Ambulatory Visit: Payer: Self-pay | Admitting: Infectious Diseases

## 2022-11-23 ENCOUNTER — Other Ambulatory Visit: Payer: Self-pay

## 2022-11-23 DIAGNOSIS — B20 Human immunodeficiency virus [HIV] disease: Secondary | ICD-10-CM

## 2022-11-23 MED ORDER — BIKTARVY 50-200-25 MG PO TABS
1.0000 | ORAL_TABLET | Freq: Every day | ORAL | 1 refills | Status: DC
Start: 2022-11-23 — End: 2023-01-20
  Filled 2022-11-23: qty 30, 30d supply, fill #0
  Filled 2022-12-14: qty 30, 30d supply, fill #1

## 2022-11-23 NOTE — Telephone Encounter (Signed)
Appointment scheduled with Dr. [name] 12/22/22.

## 2022-11-29 ENCOUNTER — Other Ambulatory Visit (HOSPITAL_COMMUNITY): Payer: Self-pay

## 2022-12-02 ENCOUNTER — Telehealth: Payer: Self-pay | Admitting: Infectious Diseases

## 2022-12-02 DIAGNOSIS — B20 Human immunodeficiency virus [HIV] disease: Secondary | ICD-10-CM

## 2022-12-02 DIAGNOSIS — I10 Essential (primary) hypertension: Secondary | ICD-10-CM

## 2022-12-02 NOTE — Telephone Encounter (Signed)
Pt is requesting to have her LAB APPT for Dr. [name]  moved to [facility] instead of [facility] due to [employer] and transportation.  Pt wanting t know if new Lab orders can  be placed for the [facility] instead.

## 2022-12-05 ENCOUNTER — Other Ambulatory Visit (HOSPITAL_COMMUNITY): Payer: Self-pay

## 2022-12-06 ENCOUNTER — Other Ambulatory Visit: Payer: Medicare Other

## 2022-12-14 ENCOUNTER — Other Ambulatory Visit (HOSPITAL_COMMUNITY): Payer: Self-pay

## 2022-12-15 ENCOUNTER — Other Ambulatory Visit (HOSPITAL_COMMUNITY): Payer: Self-pay

## 2022-12-17 ENCOUNTER — Other Ambulatory Visit (HOSPITAL_COMMUNITY): Payer: Self-pay

## 2022-12-22 ENCOUNTER — Encounter: Payer: Medicare Other | Admitting: Infectious Diseases

## 2022-12-22 ENCOUNTER — Ambulatory Visit (INDEPENDENT_AMBULATORY_CARE_PROVIDER_SITE_OTHER): Payer: Medicare Other | Admitting: Infectious Diseases

## 2022-12-22 DIAGNOSIS — K623 Rectal prolapse: Secondary | ICD-10-CM

## 2022-12-22 DIAGNOSIS — F1910 Other psychoactive substance abuse, uncomplicated: Secondary | ICD-10-CM

## 2022-12-22 DIAGNOSIS — J019 Acute sinusitis, unspecified: Secondary | ICD-10-CM | POA: Diagnosis not present

## 2022-12-22 DIAGNOSIS — B20 Human immunodeficiency virus [HIV] disease: Secondary | ICD-10-CM

## 2022-12-22 MED ORDER — AMOXICILLIN-POT CLAVULANATE 875-125 MG PO TABS
1.0000 | ORAL_TABLET | Freq: Two times a day (BID) | ORAL | 0 refills | Status: DC
Start: 2022-12-22 — End: 2023-01-14

## 2022-12-22 NOTE — Assessment & Plan Note (Signed)
Will re-eval her adherence and labs when clean.

## 2022-12-22 NOTE — Progress Notes (Signed)
   Subjective:    Patient ID: Lori [name], female  DOB: 1970/08/15, 52 y.o.        MRN: [id number]   HPI 1. Due to the national emergency this service was provided using telemedicine. phone  2. Consent from the patient for the telehealth visit and that you identified patient name, dob.   3. Your locations, Pt and Provider home, IMTS  4. Chief complaint for visit sinus infection.   5. Document anyone else on the call none  6. If the visit was a phone call, that you include the time you spent on the call: 8 minutes.   Was in [facility] 5-21 to 5-27 this year with cocaine use and bipolar. She was restarted on latuda and provigil. She quit taking the latuda because it made her drowsy.  She feels like she has a sinus infection due to all the cocaine she has been using. Fever and chills. Has had sweats as well.  Has had GI upset with eating as well (pain).  Having n/v as well. Has to get off call, states she will call EMS. She does not have a ride to get here or to get to ED.  Has not gotten her recto-vaginal prolapse.   HIV 1 RNA Quant  Date Value  08/17/2021 30 copies/mL  10/30/2020 Not Detected Copies/mL  12/03/2019 30 copies/mL (H)   CD4 T Cell Abs (/uL)  Date Value  05/17/2022 807  10/30/2020 570  12/03/2019 503     Health Maintenance  Topic Date Due   Medicare Annual Wellness (AWV)  Never done   Zoster Vaccines- Shingrix (1 of 2) Never done   Colonoscopy  Never done   PAP SMEAR-Modifier  12/17/2020   Lung Cancer Screening  12/20/2020   COVID-19 Vaccine (5 - 2023-24 season) 08/19/2022   INFLUENZA VACCINE  01/13/2023   MAMMOGRAM  08/29/2023   DTaP/Tdap/Td (2 - Td or Tdap) 11/17/2027   Hepatitis C Screening  Completed   HIV Screening  Completed   HPV VACCINES  Aged Out      ROS  Please see HPI. All other systems reviewed and negative.     Objective:  Physical Exam Not done due to video visit.         Assessment & Plan:

## 2022-12-22 NOTE — Assessment & Plan Note (Signed)
She will come to ED for repeat eval and consideration of detox.  She will need medical clearance and probably hydration prior to admission.

## 2022-12-22 NOTE — Assessment & Plan Note (Signed)
She will f/u for repair.

## 2022-12-22 NOTE — Assessment & Plan Note (Signed)
Will send her trial of augmentin if she does not come to ed

## 2022-12-24 ENCOUNTER — Other Ambulatory Visit (HOSPITAL_COMMUNITY): Payer: Self-pay

## 2022-12-27 ENCOUNTER — Other Ambulatory Visit (HOSPITAL_COMMUNITY): Payer: Self-pay

## 2022-12-28 ENCOUNTER — Other Ambulatory Visit (HOSPITAL_COMMUNITY): Payer: Self-pay

## 2022-12-28 ENCOUNTER — Other Ambulatory Visit: Payer: Self-pay

## 2022-12-28 MED ORDER — AMOXICILLIN-POT CLAVULANATE 875-125 MG PO TABS
1.0000 | ORAL_TABLET | Freq: Two times a day (BID) | ORAL | 0 refills | Status: DC
  Filled 2022-12-28: qty 28, 14d supply, fill #0

## 2022-12-30 ENCOUNTER — Other Ambulatory Visit (HOSPITAL_COMMUNITY): Payer: Self-pay

## 2022-12-31 ENCOUNTER — Other Ambulatory Visit (HOSPITAL_COMMUNITY): Payer: Self-pay

## 2023-01-03 ENCOUNTER — Other Ambulatory Visit (HOSPITAL_COMMUNITY): Payer: Self-pay

## 2023-01-04 ENCOUNTER — Other Ambulatory Visit (HOSPITAL_COMMUNITY): Payer: Self-pay

## 2023-01-14 ENCOUNTER — Inpatient Hospital Stay (HOSPITAL_COMMUNITY)
Admission: AD | Admit: 2023-01-14 | Discharge: 2023-01-20 | DRG: 885 | Disposition: A | Discharge status: Home / Self Care | Payer: Medicare Other | Source: Intra-hospital | Attending: Psychiatry | Admitting: Psychiatry

## 2023-01-14 ENCOUNTER — Emergency Department
Admission: EM | Admit: 2023-01-14 | Discharge: 2023-01-14 | Disposition: A | Discharge status: Other Acute Inpatient Hospital | Payer: Medicare Other | Attending: Student in an Organized Health Care Education/Training Program | Admitting: Student in an Organized Health Care Education/Training Program

## 2023-01-14 ENCOUNTER — Other Ambulatory Visit: Payer: Self-pay

## 2023-01-14 ENCOUNTER — Encounter: Payer: Self-pay | Admitting: Intensive Care

## 2023-01-14 DIAGNOSIS — Z79899 Other long term (current) drug therapy: Secondary | ICD-10-CM | POA: Diagnosis not present

## 2023-01-14 DIAGNOSIS — F319 Bipolar disorder, unspecified: Secondary | ICD-10-CM | POA: Diagnosis present

## 2023-01-14 DIAGNOSIS — F1290 Cannabis use, unspecified, uncomplicated: Secondary | ICD-10-CM | POA: Diagnosis present

## 2023-01-14 DIAGNOSIS — F32A Depression, unspecified: Secondary | ICD-10-CM

## 2023-01-14 DIAGNOSIS — Z9151 Personal history of suicidal behavior: Secondary | ICD-10-CM | POA: Diagnosis not present

## 2023-01-14 DIAGNOSIS — F17210 Nicotine dependence, cigarettes, uncomplicated: Secondary | ICD-10-CM | POA: Diagnosis present

## 2023-01-14 DIAGNOSIS — F1410 Cocaine abuse, uncomplicated: Secondary | ICD-10-CM | POA: Diagnosis present

## 2023-01-14 DIAGNOSIS — F329 Major depressive disorder, single episode, unspecified: Secondary | ICD-10-CM | POA: Insufficient documentation

## 2023-01-14 DIAGNOSIS — I10 Essential (primary) hypertension: Secondary | ICD-10-CM | POA: Diagnosis present

## 2023-01-14 DIAGNOSIS — Z91148 Patient's other noncompliance with medication regimen for other reason: Secondary | ICD-10-CM

## 2023-01-14 DIAGNOSIS — F1210 Cannabis abuse, uncomplicated: Secondary | ICD-10-CM | POA: Diagnosis present

## 2023-01-14 DIAGNOSIS — F4312 Post-traumatic stress disorder, chronic: Secondary | ICD-10-CM | POA: Diagnosis present

## 2023-01-14 DIAGNOSIS — F1914 Other psychoactive substance abuse with psychoactive substance-induced mood disorder: Secondary | ICD-10-CM | POA: Diagnosis present

## 2023-01-14 DIAGNOSIS — F332 Major depressive disorder, recurrent severe without psychotic features: Secondary | ICD-10-CM | POA: Diagnosis not present

## 2023-01-14 DIAGNOSIS — Z21 Asymptomatic human immunodeficiency virus [HIV] infection status: Secondary | ICD-10-CM | POA: Insufficient documentation

## 2023-01-14 DIAGNOSIS — F1910 Other psychoactive substance abuse, uncomplicated: Secondary | ICD-10-CM | POA: Diagnosis present

## 2023-01-14 DIAGNOSIS — Z5982 Transportation insecurity: Secondary | ICD-10-CM

## 2023-01-14 DIAGNOSIS — R45851 Suicidal ideations: Secondary | ICD-10-CM | POA: Diagnosis not present

## 2023-01-14 DIAGNOSIS — F411 Generalized anxiety disorder: Secondary | ICD-10-CM | POA: Diagnosis present

## 2023-01-14 HISTORY — DX: Essential (primary) hypertension: I10

## 2023-01-14 LAB — COMPREHENSIVE METABOLIC PANEL
ALT: 16 U/L (ref 0–44)
AST: 23 U/L (ref 15–41)
Albumin: 4.9 g/dL (ref 3.5–5.0)
Alkaline Phosphatase: 56 U/L (ref 38–126)
Anion gap: 12 (ref 5–15)
BUN: 17 mg/dL (ref 6–20)
CO2: 24 mmol/L (ref 22–32)
Calcium: 9.6 mg/dL (ref 8.9–10.3)
Chloride: 106 mmol/L (ref 98–111)
Creatinine, Ser: 0.97 mg/dL (ref 0.44–1.00)
GFR, Estimated: >60 mL/min (ref >60)
Glucose, Bld: 105 mg/dL — ABNORMAL HIGH (ref 70–99)
Potassium: 3.3 mmol/L — ABNORMAL LOW (ref 3.5–5.1)
Sodium: 142 mmol/L (ref 135–145)
Total Bilirubin: 1 mg/dL (ref 0.3–1.2)
Total Protein: 7.8 g/dL (ref 6.5–8.1)

## 2023-01-14 LAB — URINE DRUG SCREEN, QUALITATIVE (ARMC ONLY)
Amphetamines, Ur Screen: NOT DETECTED
Barbiturates, Ur Screen: NOT DETECTED
Benzodiazepine, Ur Scrn: NOT DETECTED
Cannabinoid 50 Ng, Ur ~~LOC~~: POSITIVE — AB
Cocaine Metabolite,Ur ~~LOC~~: POSITIVE — AB
MDMA (Ecstasy)Ur Screen: NOT DETECTED
Methadone Scn, Ur: NOT DETECTED
Opiate, Ur Screen: NOT DETECTED
Phencyclidine (PCP) Ur S: NOT DETECTED
Tricyclic, Ur Screen: NOT DETECTED

## 2023-01-14 LAB — SALICYLATE LEVEL: Salicylate Lvl: <7 mg/dL — ABNORMAL LOW (ref 7.0–30.0)

## 2023-01-14 LAB — CBC
HCT: 40.4 % (ref 36.0–46.0)
Hemoglobin: 14.1 g/dL (ref 12.0–15.0)
MCH: 32.8 pg (ref 26.0–34.0)
MCHC: 34.9 g/dL (ref 30.0–36.0)
MCV: 94 fL (ref 80.0–100.0)
Platelets: 227 10*3/uL (ref 150–400)
RBC: 4.3 MIL/uL (ref 3.87–5.11)
RDW: 12.7 % (ref 11.5–15.5)
WBC: 10.3 10*3/uL (ref 4.0–10.5)
nRBC: 0 % (ref 0.0–0.2)

## 2023-01-14 LAB — ETHANOL: Alcohol, Ethyl (B): <10 mg/dL (ref <10)

## 2023-01-14 LAB — ACETAMINOPHEN LEVEL: Acetaminophen (Tylenol), Serum: <10 ug/mL — ABNORMAL LOW (ref 10–30)

## 2023-01-14 MED ORDER — LISINOPRIL 5 MG PO TABS
10.0000 mg | ORAL_TABLET | Freq: Every day | ORAL | Status: DC
  Administered 2023-01-14: 10 mg via ORAL
  Filled 2023-01-14: qty 2

## 2023-01-14 MED ORDER — MAGNESIUM HYDROXIDE 400 MG/5ML PO SUSP: 30.0000 mL | Freq: Every day | ORAL | Status: DC | PRN

## 2023-01-14 MED ORDER — ACETAMINOPHEN 325 MG PO TABS
650.0000 mg | ORAL_TABLET | Freq: Four times a day (QID) | ORAL | Status: DC | PRN
  Administered 2023-01-15 – 2023-01-20 (×11): 650 mg via ORAL
  Filled 2023-01-14 (×13): qty 2

## 2023-01-14 MED ORDER — ALUM & MAG HYDROXIDE-SIMETH 200-200-20 MG/5ML PO SUSP: 30.0000 mL | ORAL | Status: DC | PRN

## 2023-01-14 MED ORDER — HYDROXYZINE HCL 50 MG PO TABS
50.0000 mg | ORAL_TABLET | Freq: Three times a day (TID) | ORAL | Status: DC | PRN
  Administered 2023-01-14 – 2023-01-20 (×12): 50 mg via ORAL
  Filled 2023-01-14 (×13): qty 1

## 2023-01-14 MED ORDER — BICTEGRAVIR-EMTRICITAB-TENOFOV 50-200-25 MG PO TABS
1.0000 | ORAL_TABLET | Freq: Every day | ORAL | Status: DC
  Administered 2023-01-15 – 2023-01-20 (×6): 1 via ORAL
  Filled 2023-01-14 (×7): qty 1

## 2023-01-14 MED ORDER — MODAFINIL 100 MG PO TABS
100.0000 mg | ORAL_TABLET | Freq: Every day | ORAL | Status: DC
  Administered 2023-01-14: 100 mg via ORAL
  Filled 2023-01-14: qty 1

## 2023-01-14 MED ORDER — NICOTINE POLACRILEX 2 MG MT GUM
CHEWING_GUM | OROMUCOSAL | Status: AC
  Filled 2023-01-14: qty 1

## 2023-01-14 MED ORDER — TRAZODONE HCL 50 MG PO TABS: 50.0000 mg | ORAL_TABLET | Freq: Every evening | ORAL | Status: DC | PRN

## 2023-01-14 MED ORDER — MODAFINIL 100 MG PO TABS: 100.0000 mg | ORAL_TABLET | Freq: Every day | ORAL | Status: DC

## 2023-01-14 MED ORDER — LISINOPRIL 10 MG PO TABS
10.0000 mg | ORAL_TABLET | Freq: Every day | ORAL | Status: DC
  Administered 2023-01-15 – 2023-01-20 (×6): 10 mg via ORAL
  Filled 2023-01-14 (×7): qty 1

## 2023-01-14 MED ORDER — TRAZODONE HCL 50 MG PO TABS
50.0000 mg | ORAL_TABLET | Freq: Every evening | ORAL | Status: DC | PRN
  Administered 2023-01-14 – 2023-01-19 (×5): 50 mg via ORAL
  Filled 2023-01-14 (×6): qty 1

## 2023-01-14 MED ORDER — BICTEGRAVIR-EMTRICITAB-TENOFOV 50-200-25 MG PO TABS
1.0000 | ORAL_TABLET | Freq: Every day | ORAL | Status: DC
  Administered 2023-01-14: 1 via ORAL
  Filled 2023-01-14 (×2): qty 1

## 2023-01-14 MED ORDER — ACETAMINOPHEN 500 MG PO TABS
1000.0000 mg | ORAL_TABLET | Freq: Once | ORAL | Status: AC
  Administered 2023-01-14: 1000 mg via ORAL
  Filled 2023-01-14: qty 2

## 2023-01-14 NOTE — BH Assessment (Signed)
Comprehensive Clinical Assessment (CCA) Note  01/14/2023 Lori [name] [id number]  Chief Complaint:  Chief Complaint  Patient presents with   Suicidal   Visit Diagnosis: Major Depression   Lori [name] is a 52 year old female who presents to the ER due to having thoughts of ending her life, by walking into oncoming traffic. Per the patient, her life sucks. She currently lives with her brother but was unwilling to share details about the living arrangements, besides "it's sucky."  She reports of being hopeless, with depressed moods. She also shared she's non-compliant with her medications. She has a suicide attempt the past, by overdosing.  During the interview, she was calm, cooperative and pleasant. She was able to provide appropriate answers to the questions. She denies HI and AV/H. She denies any involvement with the legal system. She admits to the use of cocaine, alcohol and cannabis.   CCA Screening, Triage and Referral (STR)  Patient Reported Information How did you hear about [place]? Self  What Is the Reason for Your Visit/Call Today? Patient brought to the ER due to SI with plan to walk into oncoming traffic.  How Long Has This Been Causing You Problems? 1 wk - 1 month  What Do You Feel Would Help You the Most Today? Treatment for Depression or other mood problem; Alcohol or Drug Use Treatment   Have You Recently Had Any Thoughts About Hurting Yourself? Yes  Are You Planning to Commit Suicide/Harm Yourself At This time? Yes   Flowsheet Row ED from 01/14/2023 in [facility] Department at [facility] Admission (Discharged) from 11/02/2022 in [facility] INPATIENT [facility] ED from 11/01/2022 in [facility] Emergency Department at [facility]  C-SSRS RISK CATEGORY High Risk High Risk High Risk       Have you Recently Had Thoughts About Hurting Someone Lori [name]? No  Are You Planning to Harm Someone at This Time? No  Explanation:  N/A   Have You Used Any Alcohol or Drugs in the Past 24 Hours? Yes  What Did You Use and How Much? Cocaine, alcohol and cannabis   Do You Currently Have a Therapist/Psychiatrist? No  Name of Therapist/Psychiatrist:    Have You Been Recently Discharged From Any Office Practice or Programs? No  Explanation of Discharge From Practice/Program: N/A     CCA Screening Triage Referral Assessment Type of Contact: Face-to-Face  Telemedicine Service Delivery:   Is this Initial or Reassessment?   Date Telepsych consult ordered in [facility]:    Time Telepsych consult ordered in [facility]:    Location of Assessment: [facility] ED  Provider Location: [facility] ED   Collateral Involvement: None   Does Patient Have a Court Appointed Legal Guardian? No  Legal Guardian Contact Information: N/A  Copy of Legal Guardianship Form: -- (N/A)  Legal Guardian Notified of Arrival: -- (N/A)  Legal Guardian Notified of Pending Discharge: -- (N/A)  If Minor and Not Living with Parent(s), Who has Custody? N/A  Is CPS involved or ever been involved? Never  Is APS involved or ever been involved? Never   Patient Determined To Be At Risk for Harm To Self or Others Based on Review of Patient Reported Information or Presenting Complaint? Yes, for Self-Harm  Method: Plan without intent (denies HI)  Availability of Means: No access or NA (denies HI)  Intent: Clearly intends on inflicting harm that could cause death (denies HI)  Notification Required: No need or identified person (denies HI)  Additional Information for Danger to  Others Potential: Previous attempts  Additional Comments for Danger to Others Potential: N/A  Are There Guns or Other Weapons in Your Home? No  Types of Guns/Weapons: N/A  Are These Weapons Safely Secured?                            -- (N/A)  Who Could Verify You Are Able To Have These Secured: N/A  Do You Have any Outstanding Charges, Pending Court Dates, Parole/Probation? Patient  has been on probation in [facility] for two years  Contacted To Inform of Risk of Harm To Self or Others: -- (N/A)    Does Patient Present under Involuntary Commitment? No    [place] of Residence: [place]   Patient Currently Receiving the Following Services: Not Receiving Services   Determination of Need: Emergent (2 hours)   Options For Referral: Inpatient Hospitalization; ED Visit     CCA Biopsychosocial Patient Reported Schizophrenia/Schizoaffective Diagnosis in Past: No   Strengths: Some insight, seeking help and pleasant.   Mental Health Symptoms Depression:   Difficulty Concentrating; Change in energy/activity; Hopelessness; Sleep (too much or little); Worthlessness   Duration of Depressive symptoms:  Duration of Depressive Symptoms: Greater than two weeks   Mania:   None   Anxiety:    Restlessness; None   Psychosis:   None   Duration of Psychotic symptoms:    Trauma:   N/A   Obsessions:   N/A   Compulsions:   N/A   Inattention:   N/A   Hyperactivity/Impulsivity:   N/A   Oppositional/Defiant Behaviors:   N/A   Emotional Irregularity:   N/A   Other Mood/Personality Symptoms:   N/A    Mental Status Exam Appearance and self-care  Stature:   Average   Weight:   Average weight   Clothing:   Neat/clean; Age-appropriate   Grooming:   Normal   Cosmetic use:   None   Posture/gait:   Normal   Motor activity:   -- (Within normal range.)   Sensorium  Attention:   Normal   Concentration:   Normal   Orientation:   X5   Recall/memory:   Normal   Affect and Mood  Affect:   Appropriate; Depressed; Full Range   Mood:   Depressed   Relating  Eye contact:   Normal   Facial expression:   Depressed; Responsive   Attitude toward examiner:   Cooperative   Thought and Language  Speech flow:  Clear and Coherent; Normal   Thought content:   Appropriate to Mood and Circumstances   Preoccupation:    None   Hallucinations:   None   Organization:   Intact; Coherent   [employer] of Knowledge:   Fair   Intelligence:   Average   Abstraction:   Normal; Functional   Judgement:   Normal   Reality Testing:   Realistic   Insight:   Fair   Decision Making:   Vacilates   Social Functioning  Social Maturity:   Isolates   Social Judgement:   Heedless; "Street Smart"   Stress  Stressors:   Family conflict; Relationship; Other (Comment)   Coping Ability:   Exhausted; Overwhelmed   Skill Deficits:   None   Supports:   Support needed     Religion: Religion/Spirituality Are You A Religious Person?: No  Leisure/Recreation: Leisure / Recreation Do You Have Hobbies?: No  Exercise/Diet: Exercise/Diet Do You Exercise?: No Have You Gained  or Lost A Significant Amount of Weight in the Past Six Months?: No Do You Follow a Special Diet?: No Do You Have Any Trouble Sleeping?: No   CCA Employment/Education Employment/Work Situation: Employment / Work Clinical biochemist has Been Impacted by Current Illness: No  Education: Education Is Patient Currently Attending School?: No Did You Have An Individualized Education Program (IIEP): No Did You Have Any Difficulty At [employer]?: No Patient's Education Has Been Impacted by Current Illness: No   CCA Family/Childhood History Family and Relationship History: Family history Marital status: Single Does patient have children?: Yes How many children?: 2 How is patient's relationship with their children?: Patient would not share.  Childhood History:  Childhood History By whom was/is the patient raised?: Mother Did patient suffer any verbal/emotional/physical/sexual abuse as a child?: No Did patient suffer from severe childhood neglect?: No Has patient ever been sexually abused/assaulted/raped as an adolescent or adult?: No Was the patient ever a victim of a crime or a disaster?: No Witnessed  domestic violence?: No Has patient been affected by domestic violence as an adult?: No       CCA Substance Use Alcohol/Drug Use: Alcohol / Drug Use Pain Medications: See PTA Prescriptions: See PTA Over the Counter: See PTA History of alcohol / drug use?: Yes Longest period of sobriety (when/how long): Unable to quantify Substance #1 Name of Substance 1: Cocaine 1 - Frequency: Unable to quantify 1 - Last Use / Amount: Unable to quantify Substance #2 Name of Substance 2: Alcohol 2 - Frequency: Unable to quantify 2 - Last Use / Amount: Unable to quantify Substance #3 Name of Substance 3: Cannabis 3 - Frequency: Unable to quantify 3 - Last Use / Amount: Unable to quantify   ASAM's:  Six Dimensions of Multidimensional Assessment  Dimension 1:  Acute Intoxication and/or Withdrawal Potential:      Dimension 2:  Biomedical Conditions and Complications:      Dimension 3:  Emotional, Behavioral, or Cognitive Conditions and Complications:     Dimension 4:  Readiness to Change:     Dimension 5:  Relapse, Continued use, or Continued Problem Potential:     Dimension 6:  Recovery/Living Environment:     ASAM Severity Score:    ASAM Recommended Level of Treatment:     Substance use Disorder (SUD)    Recommendations for Services/Supports/Treatments:    Discharge Disposition:    DSM5 Diagnoses: Patient Active Problem List   Diagnosis Date Noted   Generalized anxiety disorder 11/03/2022   MDD (major depressive disorder), recurrent severe, without psychosis (HCC) 11/02/2022   Polysubstance abuse (HCC) 11/02/2022   Suicidal ideation 11/02/2022   Bipolar disorder, unspecified (HCC) 05/16/2022   Opioid abuse (HCC) 05/16/2022   Cannabis use disorder 05/16/2022   Chronic post-traumatic stress disorder (PTSD) 05/16/2022   Rectal prolapse 11/27/2020   Alcohol abuse with alcohol-induced mood disorder (HCC) 11/14/2020   Tick bite 10/30/2020   Alcohol abuse 12/18/2019   Lung  nodule 11/16/2017   Cocaine abuse (HCC) 11/16/2017   Syphilis 07/18/2017   Hypertension 07/22/2014   Migraine 10/31/2006   Human immunodeficiency virus (HIV) disease (HCC) 06/27/2006   TOBACCO ABUSE 06/27/2006   Sinusitis 06/27/2006     Referrals to Alternative Service(s): Referred to Alternative Service(s):   Place:   Date:   Time:    Referred to Alternative Service(s):   Place:   Date:   Time:    Referred to Alternative Service(s):   Place:   Date:   Time:  Referred to Alternative Service(s):   Place:   Date:   Time:     Lilyan Gilford MS, LCAS, [employer], [facility] Therapeutic Triage Specialist 01/14/2023 11:22 AM

## 2023-01-14 NOTE — Progress Notes (Signed)
Admission Note:Patient is a 52 year old female admitted to the unit voluntarily from [facility] for suicidal ideation with a plan to overdose or walk into traffic.  UDS positive for Cocaine and Cannabinoid.  Patient presents with irritable mood and affect.  Refused to sign consent for treatment or participate in admission process.  Skin and personal belongings completed.  Skin is dry and intact.  No contraband found.  Patient oriented to the unit, staff and room.  Patient signed 72 hours request for discharge upon getting on the unit.  Routine safety checks initiated.  Patient is safe on the unit.

## 2023-01-14 NOTE — ED Notes (Signed)
[facility] HAS BEEN CALLED FOR PT. PICK UP, SPOKE WITH REP. JOYCE SHE STATED HER ETA WAS 30 MINUTES.

## 2023-01-14 NOTE — ED Notes (Addendum)
Belongings include (2 bags) -Ear rings (placed in specimen cup)  -Floral shirt  -Blue bra  -Black hair tie   -Jeans  -Gray socks  -Blue shoes -Black purse    2 rainbow knifes  1 sliver knifes  1 stick (pt states its sharp) Given to security

## 2023-01-14 NOTE — ED Provider Notes (Signed)
[facility] Provider Note    Event Date/Time   First MD Initiated Contact with Patient 01/14/23 0813     (approximate)   History   Suicidal   HPI  Lori [name] is a 52 y.o. female history of ankle depression, HIV, substance abuse, suicidal ideation presents to the ER voluntary feeling that she needs inpatient admission due to suicidal thoughts.  Denies active plan right now.  States she has had previous attempts with overdosing.  States that she was previously on [place] but has not been taking that because it makes her feel tired.  Started feeling particularly bad yesterday due to "typical triggers and same shit different day.  "     Physical Exam   Triage Vital Signs: ED Triage Vitals  Encounter Vitals Group     BP 01/14/23 0752 (!) 117/98     Systolic BP Percentile --      Diastolic BP Percentile --      Pulse Rate 01/14/23 0752 81     Resp 01/14/23 0752 16     Temp 01/14/23 0752 98.8 F (37.1 C)     Temp Source 01/14/23 0752 Oral     SpO2 01/14/23 0752 97 %     Weight 01/14/23 0753 200 lb (90.7 kg)     Height 01/14/23 0753 5\' 4"  (1.626 m)     Head Circumference --      Peak Flow --      Pain Score 01/14/23 0753 7     Pain Loc --      Pain Education --      Exclude from Growth Chart --     Most recent vital signs: Vitals:   01/14/23 0752  BP: (!) 117/98  Pulse: 81  Resp: 16  Temp: 98.8 F (37.1 C)  SpO2: 97%     Constitutional: Alert  Eyes: Conjunctivae are normal.  Head: Atraumatic. Nose: No congestion/rhinnorhea. Mouth/Throat: Mucous membranes are moist.   Neck: Painless ROM.  Cardiovascular:   Good peripheral circulation. Respiratory: Normal respiratory effort.  No retractions.  Gastrointestinal: Soft and nontender.  Musculoskeletal:  no deformity Neurologic:  MAE spontaneously. No gross focal neurologic deficits are appreciated.  Skin:  Skin is warm, dry and intact. No rash noted. Psychiatric: Mood and affect  are withdrawal, blunted. Speech and behavior are normal.    ED Results / Procedures / Treatments   Labs (all labs ordered are listed, but only abnormal results are displayed) Labs Reviewed  CBC  COMPREHENSIVE METABOLIC PANEL  ETHANOL  SALICYLATE LEVEL  ACETAMINOPHEN LEVEL  URINE DRUG SCREEN, QUALITATIVE ([facility] ONLY)     EKG     RADIOLOGY    PROCEDURES:  Critical Care performed:   Procedures   MEDICATIONS ORDERED IN ED: Medications - No data to display   IMPRESSION / MDM / ASSESSMENT AND PLAN / ED COURSE  I reviewed the triage vital signs and the nursing notes.                              Differential diagnosis includes, but is not limited to, Psychosis, delirium, medication effect, noncompliance, polysubstance abuse, Si, Hi, depression  Pt here for evaluation of depression and SI.  Patient has psych history of depression/anxiety.  Laboratory testing was ordered to evaluation for underlying electrolyte derangement or signs of underlying organic pathology to explain today's presentation.  Based on history and physical and laboratory evaluation, it appears  that the patient's presentation is 2/2 underlying psychiatric disorder and will require further evaluation and management by inpatient psychiatry.  Patient is here voluntary. Disposition pending psychiatric evaluation.  The patient has been placed in psychiatric observation due to the need to provide a safe environment for the patient while obtaining psychiatric consultation and evaluation, as well as ongoing medical and medication management to treat the patient's condition.       FINAL CLINICAL IMPRESSION(S) / ED DIAGNOSES   Final diagnoses:  Depression, unspecified depression type  Suicidal thoughts     Rx / DC Orders   ED Discharge Orders     None        Note:  This document was prepared using Dragon voice recognition software and may include unintentional dictation errors.    Willy Eddy, MD 01/14/23 [phone]

## 2023-01-14 NOTE — BH Assessment (Signed)
Patient has been accepted to [facility].  Patient assigned to room 301-1 Accepting physician is Dr. Sherron Flemings.  Call report to [phone].  Representative was Landry Dyke   ER Staff is aware of it:  Misty Stanley, ER Regis Bill, Patient's Nurse   Address: [address]  [place], [place] [address]  Patient bed will be available at 2030.

## 2023-01-14 NOTE — ED Triage Notes (Signed)
When asked patient what she is here for today she states "Put me in the psych ward. I am sick of this shit"  Reports suicidal thoughts intermittently but no plan

## 2023-01-14 NOTE — ED Notes (Signed)
VOL  GOING  TO  [facility]  TONIGHT  BY  [facility]  CONSULT  DONE

## 2023-01-14 NOTE — Consult Note (Signed)
[facility] Face-to-Face Psychiatry Consult   Reason for Consult:  suicidal ideation Referring Physician:  Willy Eddy MD Patient Identification: Lori [name] MRN:  [id number] Principal Diagnosis: <principal problem not specified> Diagnosis:  Active Problems:   * No active hospital problems. *   Total Time spent with patient: 15 minutes  Subjective:   Lori [name] is a 52 y.o. female patient seen due to suicidal ideation. Patient states "I'm feeling hopeless again".  HPI:  Lori [name] is a 51 yo female presenting to the ED after experiencing suicidal ideations. She reports feeling hopeless again. Patient has a psychiatric history of Alcohol abuse with alcohol induced mood disorder, cocaine abuse, opioid abuse, cannabis use disorder, Bipolar disorder, MDD and GAD.   Per her record, patient previously attempted suicide by overdose. She reports medication noncompliance with prescribed Latuda 20 mg daily with supper. She has a medical history of HIV and hypertension. Patient confirmed active suicidal ideation but declined to speak about her plan. She reports that her home life is undesirable but declined to elaborate further on her living circumstances with her brother. She reports current polysubstance use, stating "crack, weed, vodka". UDS positive for cannabinoid and cocaine metabolite. Ethyl level <10 mg/dL. She voices desire for improvement and a need for inpatient psychiatry due to suicidal ideations.  Patient is A&O x 4 and calm, lying in the bed with limited engagement and eye contact. Patient appears disheveled and dysphoric with congruent affect. She denies HI/AVH/delusional thought.paranoia.  Past Psychiatric History: Alcohol abuse with alcohol induced mood disorder, cocaine abuse, opioid abuse, cannabis use disorder, Bipolar disorder, MDD and GAD  Risk to Self:   Risk to Others:   Prior Inpatient Therapy:   Prior Outpatient Therapy:    Past Medical History:  Past  Medical History:  Diagnosis Date   Abnormal Pap smear    Had Cryo to treat   Allergy    Anemia    Anxiety    Cluster headaches    HIV infection (HCC)    Hypertension    Lung nodule    Vaginal venereal warts     Past Surgical History:  Procedure Laterality Date   ABDOMINAL HYSTERECTOMY     KNEE ARTHROSCOPY Left    bakers cyst removal   TUBAL LIGATION     Family History:  Family History  Problem Relation Age of Onset   CAD Mother        MI 2016   Uterine cancer Mother    Cervical cancer Mother    Colon cancer Mother    Lung cancer Maternal Grandmother    Cancer Maternal Grandfather        larynx   Prostate cancer Maternal Grandfather    Prostate cancer Father    Breast cancer Neg Hx    Liver disease Neg Hx    Esophageal cancer Neg Hx    Stomach cancer Neg Hx    Colon polyps Neg Hx    Family Psychiatric  History: none known Social History:  Social History   Substance and Sexual Activity  Alcohol Use Yes     Social History   Substance and Sexual Activity  Drug Use Yes   Frequency: 7.0 times per week   Types: Marijuana, Cocaine   Comment: marijuana daily, cocaine as needed    Social History   Socioeconomic History   Marital status: Single    Spouse name: Not on file   Number of children: Not on file   Years of education: Not  on file   Highest education level: Not on file  Occupational History   Not on file  Tobacco Use   Smoking status: Every Day    Current packs/day: 1.00    Average packs/day: 1 pack/day for 27.0 years (27.0 ttl pk-yrs)    Types: Cigarettes   Smokeless tobacco: Never   Tobacco comments:    patient not ready to quit; considering patches  Vaping Use   Vaping status: Former   Substances: Nicotine, THC  Substance and Sexual Activity   Alcohol use: Yes   Drug use: Yes    Frequency: 7.0 times per week    Types: Marijuana, Cocaine    Comment: marijuana daily, cocaine as needed   Sexual activity: Yes    Birth control/protection:  Surgical, Condom    Comment: pt. declined condoms  Other Topics Concern   Not on file  Social History Narrative   Not on file   Social Determinants of Health   Financial Resource Strain: Low Risk  (02/04/2020)   Overall Financial Resource Strain (CARDIA)    Difficulty of Paying Living Expenses: Not very hard  Food Insecurity: No Food Insecurity (11/02/2022)   Hunger Vital Sign    Worried About Running Out of Food in the Last Year: Never true    Ran Out of Food in the Last Year: Never true  Transportation Needs: Unmet Transportation Needs (11/02/2022)   PRAPARE - Transportation    Lack of Transportation (Medical): Yes    Lack of Transportation (Non-Medical): Yes  Physical Activity: Inactive (02/04/2020)   Exercise Vital Sign    Days of Exercise per Week: 0 days    Minutes of Exercise per Session: 0 min  Stress: Stress Concern Present (02/04/2020)   [employer] of Occupational Health - Occupational Stress Questionnaire    Feeling of Stress : Very much  Social Connections: Moderately Isolated (02/04/2020)   Social Connection and Isolation Panel [NHANES]    Frequency of Communication with Friends and Family: More than three times a week    Frequency of Social Gatherings with Friends and Family: Never    Attends Religious Services: 1 to 4 times per year    Active Member of [employer] or Organizations: No    Attends Banker Meetings: Never    Marital Status: Never married   Additional Social History:    Allergies:   Allergies  Allergen Reactions   Doxycycline Swelling   Other Swelling    Mulberry fruit    Sulfonamide Derivatives Other (See Comments)    Childhood allergy.    Labs:  Results for orders placed or performed during the hospital encounter of 01/14/23 (from the past 48 hour(s))  Urine Drug Screen, Qualitative     Status: Abnormal   Collection Time: 01/14/23  7:55 AM  Result Value Ref Range   Tricyclic, Ur Screen NONE DETECTED NONE DETECTED    Amphetamines, Ur Screen NONE DETECTED NONE DETECTED   MDMA (Ecstasy)Ur Screen NONE DETECTED NONE DETECTED   Cocaine Metabolite,Ur [place] POSITIVE (A) NONE DETECTED   Opiate, Ur Screen NONE DETECTED NONE DETECTED   Phencyclidine (PCP) Ur S NONE DETECTED NONE DETECTED   Cannabinoid 50 Ng, Ur [place] POSITIVE (A) NONE DETECTED   Barbiturates, Ur Screen NONE DETECTED NONE DETECTED   Benzodiazepine, Ur Scrn NONE DETECTED NONE DETECTED   Methadone Scn, Ur NONE DETECTED NONE DETECTED    Comment: (NOTE) Tricyclics + metabolites, urine    Cutoff 1000 ng/mL Amphetamines + metabolites, urine  Cutoff 1000 ng/mL  MDMA (Ecstasy), urine              Cutoff 500 ng/mL Cocaine Metabolite, urine          Cutoff 300 ng/mL Opiate + metabolites, urine        Cutoff 300 ng/mL Phencyclidine (PCP), urine         Cutoff 25 ng/mL Cannabinoid, urine                 Cutoff 50 ng/mL Barbiturates + metabolites, urine  Cutoff 200 ng/mL Benzodiazepine, urine              Cutoff 200 ng/mL Methadone, urine                   Cutoff 300 ng/mL  The urine drug screen provides only a preliminary, unconfirmed analytical test result and should not be used for non-medical purposes. Clinical consideration and professional judgment should be applied to any positive drug screen result due to possible interfering substances. A more specific alternate chemical method must be used in order to obtain a confirmed analytical result. Gas chromatography / mass spectrometry (GC/MS) is the preferred confirm atory method. Performed at [facility], [address] [place], [place] [address]   Comprehensive metabolic panel     Status: Abnormal   Collection Time: 01/14/23  7:56 AM  Result Value Ref Range   Sodium 142 135 - 145 mmol/L   Potassium 3.3 (L) 3.5 - 5.1 mmol/L   Chloride 106 98 - 111 mmol/L   CO2 24 22 - 32 mmol/L   Glucose, Bld 105 (H) 70 - 99 mg/dL    Comment: Glucose reference range applies only to samples taken after  fasting for at least 8 hours.   BUN 17 6 - 20 mg/dL   Creatinine, Ser 6.57 0.44 - 1.00 mg/dL   Calcium 9.6 8.9 - 84.6 mg/dL   Total Protein 7.8 6.5 - 8.1 g/dL   Albumin 4.9 3.5 - 5.0 g/dL   AST 23 15 - 41 U/L   ALT 16 0 - 44 U/L   Alkaline Phosphatase 56 38 - 126 U/L   Total Bilirubin 1.0 0.3 - 1.2 mg/dL   GFR, Estimated >96 >29 mL/min    Comment: (NOTE) Calculated using the CKD-EPI Creatinine Equation (2021)    Anion gap 12 5 - 15    Comment: Performed at [facility], [address], [place], [place] [address]  Ethanol     Status: None   Collection Time: 01/14/23  7:56 AM  Result Value Ref Range   Alcohol, Ethyl (B) <10 <10 mg/dL    Comment: (NOTE) Lowest detectable limit for serum alcohol is 10 mg/dL.  For medical purposes only. Performed at [facility], [address] [place], [place] [address]   Salicylate level     Status: Abnormal   Collection Time: 01/14/23  7:56 AM  Result Value Ref Range   Salicylate Lvl <7.0 (L) 7.0 - 30.0 mg/dL    Comment: Performed at [facility], [address] [place], [place] [address]  Acetaminophen level     Status: Abnormal   Collection Time: 01/14/23  7:56 AM  Result Value Ref Range   Acetaminophen (Tylenol), Serum <10 (L) 10 - 30 ug/mL    Comment: (NOTE) Therapeutic concentrations vary significantly. A range of 10-30 ug/mL  may be an effective concentration for many patients. However, some  are best treated at concentrations outside of this range. Acetaminophen concentrations >150  ug/mL at 4 hours after ingestion  and >50 ug/mL at 12 hours after ingestion are often associated with  toxic reactions.  Performed at [facility], [address] [place], [place] [address]   cbc     Status: None   Collection Time: 01/14/23  7:56 AM  Result Value Ref Range   WBC 10.3 4.0 - 10.5 K/uL   RBC 4.30 3.87 - 5.11 MIL/uL   Hemoglobin 14.1 12.0 - 15.0 g/dL   HCT 60.4 54.0 - 98.1 %   MCV 94.0 80.0 -  100.0 fL   MCH 32.8 26.0 - 34.0 pg   MCHC 34.9 30.0 - 36.0 g/dL   RDW 19.1 47.8 - 29.5 %   Platelets 227 150 - 400 K/uL   nRBC 0.0 0.0 - 0.2 %    Comment: Performed at [facility], [address] [place], [place] [address]    Current Facility-Administered Medications  Medication Dose Route Frequency Provider Last Rate Last Admin   bictegravir-emtricitabine-tenofovir AF (BIKTARVY) 50-200-25 MG per tablet 1 tablet  1 tablet Oral Daily Willy Eddy, MD   1 tablet at 01/14/23 1038   lisinopril (ZESTRIL) tablet 10 mg  10 mg Oral Daily Willy Eddy, MD   10 mg at 01/14/23 1037   modafinil (PROVIGIL) tablet 100 mg  100 mg Oral Daily Willy Eddy, MD   100 mg at 01/14/23 1037   traZODone (DESYREL) tablet 50 mg  50 mg Oral QHS PRN Willy Eddy, MD       Current Outpatient Medications  Medication Sig Dispense Refill   bictegravir-emtricitabine-tenofovir AF (BIKTARVY) 50-200-25 MG TABS tablet Take 1 tablet by mouth daily. 30 tablet 1   docusate sodium (COLACE) 100 MG capsule Take 1 capsule (100 mg total) by mouth 2 (two) times daily. 10 capsule 0   fluticasone (FLONASE) 50 MCG/ACT nasal spray Place 2 sprays into both nostrils daily. 15 g 2   lisinopril (ZESTRIL) 10 MG tablet Take 1 tablet (10 mg total) by mouth daily. 30 tablet 0   lurasidone (LATUDA) 20 MG TABS tablet Take 1 tablet (20 mg total) by mouth daily with supper. 30 tablet 0   modafinil (PROVIGIL) 100 MG tablet Take 1 tablet (100 mg total) by mouth daily. 30 tablet 0   nicotine (NICODERM CQ - DOSED IN MG/24 HOURS) 14 mg/24hr patch Place 1 patch (14 mg total) onto the skin daily. 28 patch 0   traZODone (DESYREL) 50 MG tablet Take 1 tablet (50 mg total) by mouth at bedtime as needed for sleep. 30 tablet 0    Musculoskeletal: Strength & Muscle Tone: within normal limits Gait & Station: normal Patient leans: N/A            Psychiatric Specialty Exam:  Presentation  General Appearance:   Disheveled  Eye Contact: Fair  Speech: Clear and Coherent  Speech Volume: Normal  Handedness: Right   Mood and Affect  Mood: Dysphoric; Depressed; Hopeless  Affect: Congruent   Thought Process  Thought Processes: Coherent  Descriptions of Associations:Intact  Orientation:Full (Time, Place and Person)  Thought Content:Logical  History of Schizophrenia/Schizoaffective disorder:No  Duration of Psychotic Symptoms:No data recorded Hallucinations:Hallucinations: None  Ideas of Reference:None  Suicidal Thoughts:Suicidal Thoughts: Yes, Active  Homicidal Thoughts:Homicidal Thoughts: No   Sensorium  Memory: Immediate Good; Recent Good; Remote Good  Judgment: Fair  Insight: Good   Executive Functions  Concentration: Good  Attention Span: Good  Recall: Good  Fund of Knowledge: Good  Language: Good   Psychomotor  Activity  Psychomotor Activity:Psychomotor Activity: Normal   Assets  Assets: Desire for Improvement; Communication Skills   Sleep  Sleep:No data recorded  Physical Exam: Physical Exam Vitals reviewed.  Neurological:     Mental Status: She is oriented to person, place, and time.    Review of Systems  Psychiatric/Behavioral:  Positive for depression, substance abuse and suicidal ideas. Negative for hallucinations and memory loss. The patient is not nervous/anxious.   All other systems reviewed and are negative.  Blood pressure (!) 117/98, pulse 81, temperature 98.8 F (37.1 C), temperature source Oral, resp. rate 16, height 5\' 4"  (1.626 m), weight 90.7 kg, SpO2 97%. Body mass index is 34.33 kg/m.  Treatment Plan Summary: Daily contact with patient to assess and evaluate symptoms and progress in treatment  Disposition: Recommend psychiatric Inpatient admission when medically cleared.  Mcneil Sober, NP 01/14/2023 12:10 PM

## 2023-01-14 NOTE — ED Notes (Signed)
Breakfast tray ordered for patient.

## 2023-01-14 NOTE — ED Notes (Signed)
Dinner tray given

## 2023-01-15 ENCOUNTER — Encounter (HOSPITAL_COMMUNITY): Payer: Self-pay | Admitting: Psychiatry

## 2023-01-15 DIAGNOSIS — F4312 Post-traumatic stress disorder, chronic: Secondary | ICD-10-CM

## 2023-01-15 DIAGNOSIS — F1410 Cocaine abuse, uncomplicated: Secondary | ICD-10-CM

## 2023-01-15 DIAGNOSIS — F411 Generalized anxiety disorder: Secondary | ICD-10-CM

## 2023-01-15 DIAGNOSIS — F319 Bipolar disorder, unspecified: Principal | ICD-10-CM

## 2023-01-15 DIAGNOSIS — F1290 Cannabis use, unspecified, uncomplicated: Secondary | ICD-10-CM

## 2023-01-15 MED ORDER — LISINOPRIL 10 MG PO TABS: 10.0000 mg | ORAL_TABLET | Freq: Every day | ORAL | Status: DC

## 2023-01-15 MED ORDER — LURASIDONE HCL 20 MG PO TABS
20.0000 mg | ORAL_TABLET | Freq: Every day | ORAL | Status: DC
  Filled 2023-01-15 (×3): qty 1

## 2023-01-15 MED ORDER — NICOTINE 14 MG/24HR TD PT24
14.0000 mg | MEDICATED_PATCH | Freq: Every day | TRANSDERMAL | Status: DC
  Administered 2023-01-15 – 2023-01-20 (×6): 14 mg via TRANSDERMAL
  Filled 2023-01-15 (×7): qty 1

## 2023-01-15 NOTE — Plan of Care (Signed)

## 2023-01-15 NOTE — Progress Notes (Signed)
D: Patient alert and oriented. Affect/mood reported as improving. Denies active SI, HI, AVH, and pain. Patient goal, "to go to group."   A: Scheduled medication administered to patient, per MD orders. Support and encouragement provided. Routine safety checks conducted every 15 minutes. Patient informed to notify staff with problems or concerns.   R: No adverse drug reactions noted. Patient contracts for safety at this time. Patient compliant with medications and treatment plan. Patient receptive, calm and cooperative. Patient interacts well with others on unit. Patient remains safe at this time.

## 2023-01-15 NOTE — [facility] Group Notes (Signed)
[facility] Group Notes:  (Nursing/MHT/Case Management/Adjunct)  Date:  01/15/2023  Time:  8:58 PM  Type of Therapy:   Wrap-up group  Participation Level:  Active  Participation Quality:  Appropriate  Affect:  Appropriate  Cognitive:  Appropriate  Insight:  Appropriate  Engagement in Group:  Engaged  Modes of Intervention:  Education  Summary of Progress/Problems: Goal to make it thru the day. Day 5/10.  Lori [name] 01/15/2023, 8:58 PM

## 2023-01-15 NOTE — Group Note (Signed)
Date:  01/15/2023 Time:  3:18 PM  Group Topic/Focus:  Developing a Wellness Toolbox:   The focus of this group is to help patients develop a "wellness toolbox" with skills and strategies to promote recovery upon discharge. Dimensions of Wellness:   The focus of this group is to introduce the topic of wellness and discuss the role each dimension of wellness plays in total health.    Participation Level:  Active  Participation Quality:  Appropriate and Attentive  Affect:  Appropriate  Cognitive:  Alert and Appropriate  Insight: Appropriate and Good  Engagement in Group:  Engaged  Modes of Intervention:  Discussion and Education  Additional Comments:  n/a  Cherre Blanc 01/15/2023, 3:18 PM

## 2023-01-15 NOTE — H&P (Signed)
Psychiatric Admission Assessment Adult  Patient Identification: Lori [name] MRN:  [id number] Date of Evaluation:  01/15/2023 Chief Complaint:  Suicidal ideation [R45.851] Principal Diagnosis: MDD (major depressive disorder), recurrent severe, without psychosis (HCC) Diagnosis:  Principal Problem:   MDD (major depressive disorder), recurrent severe, without psychosis (HCC) Active Problems:   Polysubstance abuse (HCC)   Generalized anxiety disorder   Suicidal ideation  History of Present Illness: Lori [name] 52 y/o female with a psychiatric history of major depressive disorder, general anxiety, bipolar disorder, substance induced mood disorder, and polysubstance abuse (alcohol, cocaine, opiates, and cannabis) was admitted to [facility] voluntarily transferred for [facility] where she initially presented with complaints of suicidal ideation and feeling hopeless.    Patient seen face to face by this provider, chart reviewed, and consulted with Dr. Phineas Inches on  01/15/23.  On evaluation Lori [name] reports  On evaluation, Lori [name] is found standing in hallway speaking to nurse.  She agrees to go to her room for assessment.  Patient is alert, oriented x 4, and cooperative.  There is no noted distress.  Her speech is clear, coherent, at normal rate and volume.  She maintained good eye contact.  Her mood is depressed with congruent affect.  Her thought process is logical bu liner.  She currently denies suicidal/self-harm/homicidal ideation, psychosis, and paranoia.  Reporting her last suicidal thoughts were yesterday.  Stating she was here form [place] [facility] emergency room where she initially presented because she was having suicidal thoughts with a plant to walk into traffic.  Reports prior suicide attempt via overdoes.  She states that she is not happy with her life but gives no further information.  She reports polysubstance use ("crack and weed").  Urine drug screen positive  for cocaine and cannabis.  States she had been to rehab in past "but it didn't work."  She states she has no support and no outpatient psychiatric services.  She states that she has tolerated medications.  But was noncompliant with medications prior to admission.  Was prescribed Latuda during her last psychiatric hospitalization but hasn't been taking.  Patient states she has not attended any group sessions related to "I just got her last night and I'm still getting acclimated and catching up on my sleep.  I plan to attend group later today though."         Objectively there is no evidence of psychosis/mania or delusional thinking.  Her responses to assessment questions were relevant and appropriate.  She conversed coherently, with goal directed thoughts, no distractibility, or pre-occupation.  Associated Signs/Symptoms: Depression Symptoms:  anhedonia, feelings of worthlessness/guilt, hopelessness, suicidal thoughts with specific plan, (Hypo) Manic Symptoms:  Impulsivity, Irritable Mood, Anxiety Symptoms:  Excessive Worry, Psychotic Symptoms:   None reported PTSD Symptoms: NA Total Time spent with patient: 30 minutes  Past Psychiatric History:  major depressive disorder, general anxiety, bipolar disorder, substance induced mood disorder, and polysubstance abuse (alcohol, cocaine, opiates, and cannabis)   Is the patient at risk to self? Yes.    Has the patient been a risk to self in the past 6 months? Yes.    Has the patient been a risk to self within the distant past? No.  Is the patient a risk to others? No.  Has the patient been a risk to others in the past 6 months? No.  Has the patient been a risk to others within the distant past? No.   [place] Scale:  Flowsheet Row ED from 01/14/2023 in  [place] [facility] at [facility] Admission (Discharged) [facility] from 11/01/2022 in [facility] Emergency Department at  [facility]  C-SSRS RISK CATEGORY High Risk High Risk High Risk        Prior Inpatient Therapy: Yes.   If yes, describe last psychiatric hospitalization 10/2022 and one prior 05/2022 Prior Outpatient Therapy: No.     Alcohol Screening:   Substance Abuse History in the last 12 months:  Yes.   Consequences of Substance Abuse: Negative Previous Psychotropic Medications: Yes  Psychological Evaluations: Yes  Past Medical History:  Past Medical History:  Diagnosis Date   Abnormal Pap smear    Had Cryo to treat   Allergy    Anemia    Anxiety    Cluster headaches    HIV infection (HCC)    Hypertension    Lung nodule    Vaginal venereal warts     Past Surgical History:  Procedure Laterality Date   ABDOMINAL HYSTERECTOMY     KNEE ARTHROSCOPY Left    bakers cyst removal   TUBAL LIGATION     Family History:  Family History  Problem Relation Age of Onset   CAD Mother        MI 2016   Uterine cancer Mother    Cervical cancer Mother    Colon cancer Mother    Lung cancer Maternal Grandmother    Cancer Maternal Grandfather        larynx   Prostate cancer Maternal Grandfather    Prostate cancer Father    Breast cancer Neg Hx    Liver disease Neg Hx    Esophageal cancer Neg Hx    Stomach cancer Neg Hx    Colon polyps Neg Hx    Family Psychiatric  History: None reported  Tobacco Screening:  Social History   Tobacco Use  Smoking Status Every Day   Current packs/day: 1.00   Average packs/day: 1 pack/day for 27.0 years (27.0 ttl pk-yrs)   Types: Cigarettes  Smokeless Tobacco Never  Tobacco Comments   patient not ready to quit; considering patches    BH Tobacco Counseling     Are you interested in Tobacco Cessation Medications?  Yes, implement Nicotine Replacement Protocol Counseled patient on smoking cessation:  Yes Reason Tobacco Screening Not Completed: No value filed.       Social History:  Social History   Substance and Sexual Activity  Alcohol  Use Yes     Social History   Substance and Sexual Activity  Drug Use Yes   Frequency: 7.0 times per week   Types: Marijuana, Cocaine   Comment: marijuana daily, cocaine as needed    Additional Social History:                           Allergies:   Allergies  Allergen Reactions   Doxycycline Swelling   Other Swelling    Mulberry fruit    Sulfonamide Derivatives Other (See Comments)    Childhood allergy.   Lab Results:  Results for orders placed or performed during the hospital encounter of 01/14/23 (from the past 48 hour(s))  Urine Drug Screen, Qualitative     Status: Abnormal   Collection Time: 01/14/23  7:55 AM  Result Value Ref Range   Tricyclic, Ur Screen NONE DETECTED NONE DETECTED   Amphetamines, Ur Screen NONE DETECTED NONE DETECTED   MDMA (Ecstasy)Ur Screen NONE DETECTED  NONE DETECTED   Cocaine Metabolite,Ur [place] POSITIVE (A) NONE DETECTED   Opiate, Ur Screen NONE DETECTED NONE DETECTED   Phencyclidine (PCP) Ur S NONE DETECTED NONE DETECTED   Cannabinoid 50 Ng, Ur [place] POSITIVE (A) NONE DETECTED   Barbiturates, Ur Screen NONE DETECTED NONE DETECTED   Benzodiazepine, Ur Scrn NONE DETECTED NONE DETECTED   Methadone Scn, Ur NONE DETECTED NONE DETECTED    Comment: (NOTE) Tricyclics + metabolites, urine    Cutoff 1000 ng/mL Amphetamines + metabolites, urine  Cutoff 1000 ng/mL MDMA (Ecstasy), urine              Cutoff 500 ng/mL Cocaine Metabolite, urine          Cutoff 300 ng/mL Opiate + metabolites, urine        Cutoff 300 ng/mL Phencyclidine (PCP), urine         Cutoff 25 ng/mL Cannabinoid, urine                 Cutoff 50 ng/mL Barbiturates + metabolites, urine  Cutoff 200 ng/mL Benzodiazepine, urine              Cutoff 200 ng/mL Methadone, urine                   Cutoff 300 ng/mL  The urine drug screen provides only a preliminary, unconfirmed analytical test result and should not be used for non-medical purposes. Clinical consideration and professional  judgment should be applied to any positive drug screen result due to possible interfering substances. A more specific alternate chemical method must be used in order to obtain a confirmed analytical result. Gas chromatography / mass spectrometry (GC/MS) is the preferred confirm atory method. Performed at [facility], [address] [place], [place] [address]   Comprehensive metabolic panel     Status: Abnormal   Collection Time: 01/14/23  7:56 AM  Result Value Ref Range   Sodium 142 135 - 145 mmol/L   Potassium 3.3 (L) 3.5 - 5.1 mmol/L   Chloride 106 98 - 111 mmol/L   CO2 24 22 - 32 mmol/L   Glucose, Bld 105 (H) 70 - 99 mg/dL    Comment: Glucose reference range applies only to samples taken after fasting for at least 8 hours.   BUN 17 6 - 20 mg/dL   Creatinine, Ser 7.25 0.44 - 1.00 mg/dL   Calcium 9.6 8.9 - 36.6 mg/dL   Total Protein 7.8 6.5 - 8.1 g/dL   Albumin 4.9 3.5 - 5.0 g/dL   AST 23 15 - 41 U/L   ALT 16 0 - 44 U/L   Alkaline Phosphatase 56 38 - 126 U/L   Total Bilirubin 1.0 0.3 - 1.2 mg/dL   GFR, Estimated >44 >03 mL/min    Comment: (NOTE) Calculated using the CKD-EPI Creatinine Equation (2021)    Anion gap 12 5 - 15    Comment: Performed at [facility], [address] [place], [place] [address]  Ethanol     Status: None   Collection Time: 01/14/23  7:56 AM  Result Value Ref Range   Alcohol, Ethyl (B) <10 <10 mg/dL    Comment: (NOTE) Lowest detectable limit for serum alcohol is 10 mg/dL.  For medical purposes only. Performed at [facility], [address] [place], [place] [address]   Salicylate level     Status: Abnormal   Collection Time: 01/14/23  7:56 AM  Result Value Ref Range   Salicylate Lvl <7.0 (L)  7.0 - 30.0 mg/dL    Comment: Performed at [facility], [address] [place], [place] [address]  Acetaminophen level     Status: Abnormal   Collection Time: 01/14/23  7:56 AM  Result Value Ref Range    Acetaminophen (Tylenol), Serum <10 (L) 10 - 30 ug/mL    Comment: (NOTE) Therapeutic concentrations vary significantly. A range of 10-30 ug/mL  may be an effective concentration for many patients. However, some  are best treated at concentrations outside of this range. Acetaminophen concentrations >150 ug/mL at 4 hours after ingestion  and >50 ug/mL at 12 hours after ingestion are often associated with  toxic reactions.  Performed at [facility], [address] [place], [place] [address]   cbc     Status: None   Collection Time: 01/14/23  7:56 AM  Result Value Ref Range   WBC 10.3 4.0 - 10.5 K/uL   RBC 4.30 3.87 - 5.11 MIL/uL   Hemoglobin 14.1 12.0 - 15.0 g/dL   HCT 64.4 03.4 - 74.2 %   MCV 94.0 80.0 - 100.0 fL   MCH 32.8 26.0 - 34.0 pg   MCHC 34.9 30.0 - 36.0 g/dL   RDW 59.5 63.8 - 75.6 %   Platelets 227 150 - 400 K/uL   nRBC 0.0 0.0 - 0.2 %    Comment: Performed at [facility], [address] [place], [place] [address]    Blood Alcohol level:  Lab Results  Component Value Date   [facility] <10 01/14/2023   ETH <10 05/15/2022    Metabolic Disorder Labs:  Lab Results  Component Value Date   HGBA1C 5.6 05/17/2022   MPG 114 05/17/2022   No results found for: "PROLACTIN" Lab Results  Component Value Date   CHOL 195 05/17/2022   TRIG 182 (H) 05/17/2022   HDL 38 (L) 05/17/2022   CHOLHDL 5.1 05/17/2022   VLDL 36 05/17/2022   LDLCALC 121 (H) 05/17/2022   LDLCALC 172 (H) 10/30/2020    Current Medications: Current Facility-Administered Medications  Medication Dose Route Frequency Provider Last Rate Last Admin   acetaminophen (TYLENOL) tablet 650 mg  650 mg Oral Q6H PRN Penn, Cranston Neighbor, NP   650 mg at 01/15/23 1104   alum & mag hydroxide-simeth (MAALOX/MYLANTA) 200-200-20 MG/5ML suspension 30 mL  30 mL Oral Q4H PRN Penn, Cranston Neighbor, NP       bictegravir-emtricitabine-tenofovir AF (BIKTARVY) 50-200-25 MG per tablet 1 tablet  1 tablet Oral Daily Penn, Cranston Neighbor, NP    1 tablet at 01/15/23 0802   hydrOXYzine (ATARAX) tablet 50 mg  50 mg Oral TID PRN Mcneil Sober, NP   50 mg at 01/15/23 1106   lisinopril (ZESTRIL) tablet 10 mg  10 mg Oral Daily Penn, Cranston Neighbor, NP   10 mg at 01/15/23 0802   lurasidone (LATUDA) tablet 20 mg  20 mg Oral Q supper Rickard Kennerly B, NP       magnesium hydroxide (MILK OF MAGNESIA) suspension 30 mL  30 mL Oral Daily PRN Penn, Cicely, NP       modafinil (PROVIGIL) tablet 100 mg  100 mg Oral Daily Penn, Cicely, NP       nicotine (NICODERM CQ - dosed in mg/24 hours) patch 14 mg  14 mg Transdermal Q0600 Massengill, Harrold Donath, MD   14 mg at 01/15/23 1107   traZODone (DESYREL) tablet 50 mg  50 mg Oral QHS PRN Mcneil Sober, NP   50 mg at 01/14/23 2308   PTA Medications: Medications  Prior to Admission  Medication Sig Dispense Refill Last Dose   bictegravir-emtricitabine-tenofovir AF (BIKTARVY) 50-200-25 MG TABS tablet Take 1 tablet by mouth daily. 30 tablet 1    lisinopril (ZESTRIL) 10 MG tablet Take 1 tablet (10 mg total) by mouth daily. 30 tablet 0     Musculoskeletal: Strength & Muscle Tone: within normal limits Gait & Station: normal Patient leans: N/A            Psychiatric Specialty Exam:  Presentation  General Appearance:  Appropriate for Environment  Eye Contact: Good  Speech: Clear and Coherent; Normal Rate  Speech Volume: Normal  Handedness: Right   Mood and Affect  Mood: Depressed  Affect: Congruent   Thought Process  Thought Processes: Coherent; Goal Directed  Duration of Psychotic Symptoms:N/A Past Diagnosis of Schizophrenia or Psychoactive disorder: No  Descriptions of Associations:Intact  Orientation:Full (Time, Place and Person)  Thought Content:Logical  Hallucinations:Hallucinations: None  Ideas of Reference:None  Suicidal Thoughts:Suicidal Thoughts: No ("Not today")  Homicidal Thoughts:Homicidal Thoughts: No   Sensorium  Memory: Immediate Good; Recent Good; Remote  Good  Judgment: Intact  Insight: Good   Executive Functions  Concentration: Good  Attention Span: Good  Recall: Good  Fund of Knowledge: Good  Language: Good   Psychomotor Activity  Psychomotor Activity: Psychomotor Activity: Normal   Assets  Assets: Desire for Improvement; Communication Skills   Sleep  Sleep: Sleep: Good    Physical Exam: Physical Exam Vitals and nursing note reviewed.  Constitutional:      General: She is not in acute distress.    Appearance: Normal appearance. She is not ill-appearing.  HENT:     Head: Normocephalic.  Eyes:     Conjunctiva/sclera: Conjunctivae normal.  Cardiovascular:     Rate and Rhythm: Normal rate.  Pulmonary:     Effort: Pulmonary effort is normal. No respiratory distress.  Musculoskeletal:        General: Normal range of motion.  Skin:    General: Skin is warm and dry.  Neurological:     Mental Status: She is alert and oriented to person, place, and time.  Psychiatric:        Attention and Perception: Attention and perception normal. She does not perceive auditory or visual hallucinations.        Mood and Affect: Affect normal. Mood is depressed.        Speech: Speech normal.        Thought Content: Thought content normal. Thought content is not paranoid or delusional. Thought content does not include homicidal or suicidal (Denies at this time) ideation.        Cognition and Memory: Cognition normal.        Judgment: Judgment is impulsive.    Review of Systems  Constitutional:        No verbal complaints voiced  All other systems reviewed and are negative.  Blood pressure 106/72, pulse 78, temperature 98.2 F (36.8 C), temperature source Oral, resp. rate 18, height 5\' 3"  (1.6 m), weight 78.9 kg, SpO2 96%. Body mass index is 30.82 kg/m.  Treatment Plan Summary: Daily contact with patient to assess and evaluate symptoms and progress in treatment and Medication management PLAN: Safety and  Monitoring: Voluntary admission to inpatient psychiatric unit for safety, stabilization and treatment Daily contact with patient to assess and evaluate symptoms and progress in treatment Patient's case to be discussed in multi-disciplinary team meeting Observation Level: q15 minute checks Vital signs:  q12 hours Precautions: suicide, elopement, and assault  2. Psychiatric Diagnoses and Treatment:  Patient will be restarted on home medications and psychiatric medication will be started/adjusted as appropriate. Restart Latuda 20 mg daily for bipolar depression and mood stabilization.  Restarted even though she was not taking it at home.   Meds ordered this encounter  Medications   bictegravir-emtricitabine-tenofovir AF (BIKTARVY) 50-200-25 MG per tablet 1 tablet   lisinopril (ZESTRIL) tablet 10 mg   modafinil (PROVIGIL) tablet 100 mg   traZODone (DESYREL) tablet 50 mg   acetaminophen (TYLENOL) tablet 650 mg   alum & mag hydroxide-simeth (MAALOX/MYLANTA) 200-200-20 MG/5ML suspension 30 mL   magnesium hydroxide (MILK OF MAGNESIA) suspension 30 mL   hydrOXYzine (ATARAX) tablet 50 mg   nicotine polacrilex (NICORETTE) 2 MG gum    Burwell, Kalen W: cabinet override   nicotine (NICODERM CQ - dosed in mg/24 hours) patch 14 mg   lurasidone (LATUDA) tablet 20 mg                 3. Medical Issues Being Addressed:  Labs review notable for urine drug screen positive for cocaine, and marijuana. CIWA for 3 days because of alcohol use              Tobacco Use Disorder Nicotine patch 21mg /24 hours ordered Smoking cessation encouraged   4. Discharge Planning:  Social work and case management to assist with discharge planning and identification of hospital follow-up needs prior to discharge Estimated LOS: 5-7 days Discharge Concerns: Need to establish a safety plan; Medication compliance and effectiveness Discharge Goals: Return home with outpatient referrals for mental health follow-up including  medication management/psychotherapy  Physician Treatment Plan for Primary Diagnosis: MDD (major depressive disorder), recurrent severe, without psychosis (HCC) Long Term Goal(s): Improvement in symptoms so as ready for discharge  Short Term Goals: Ability to identify changes in lifestyle to reduce recurrence of condition will improve, Ability to verbalize feelings will improve, Ability to disclose and discuss suicidal ideas, Ability to demonstrate self-control will improve, Ability to identify and develop effective coping behaviors will improve, Ability to maintain clinical measurements within normal limits will improve, Compliance with prescribed medications will improve, and Ability to identify triggers associated with substance abuse/mental health issues will improve  Physician Treatment Plan for Secondary Diagnosis: Principal Problem:   MDD (major depressive disorder), recurrent severe, without psychosis (HCC) Active Problems:   Polysubstance abuse (HCC)   Generalized anxiety disorder   Suicidal ideation  Long Term Goal(s): Improvement in symptoms so as ready for discharge  Short Term Goals: Ability to identify changes in lifestyle to reduce recurrence of condition will improve, Ability to verbalize feelings will improve, Ability to disclose and discuss suicidal ideas, Ability to demonstrate self-control will improve, Ability to identify and develop effective coping behaviors will improve, Ability to maintain clinical measurements within normal limits will improve, Compliance with prescribed medications will improve, and Ability to identify triggers associated with substance abuse/mental health issues will improve    I certify that inpatient services furnished can reasonably be expected to improve the patient's condition.    Seydou Hearns, NP 8/3/202411:34 AM
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
## 2023-01-15 NOTE — Progress Notes (Signed)
Pt rates depression 0/10 and anxiety 0/10. Pt reports a good appetite. Pt shares she has a headache and knee pain, rates 7/10, pt given PRN per MAR. Pt denies SI/HI/AVH and verbally contracts for safety. Provided support and encouragement. Pt safe on the unit. Q 15 minute safety checks continued.

## 2023-01-16 LAB — BASIC METABOLIC PANEL WITH GFR
Anion gap: 9 (ref 5–15)
BUN: 18 mg/dL (ref 6–20)
CO2: 24 mmol/L (ref 22–32)
Calcium: 9 mg/dL (ref 8.9–10.3)
Chloride: 108 mmol/L (ref 98–111)
Creatinine, Ser: 0.9 mg/dL (ref 0.44–1.00)
GFR, Estimated: >60 mL/min (ref >60)
Glucose, Bld: 93 mg/dL (ref 70–99)
Potassium: 3.7 mmol/L (ref 3.5–5.1)
Sodium: 141 mmol/L (ref 135–145)

## 2023-01-16 MED ORDER — LUMATEPERONE TOSYLATE 42 MG PO CAPS
42.0000 mg | ORAL_CAPSULE | Freq: Every day | ORAL | Status: DC
  Administered 2023-01-17 – 2023-01-19 (×3): 42 mg via ORAL
  Filled 2023-01-16 (×4): qty 1

## 2023-01-16 MED ORDER — LOPERAMIDE HCL 2 MG PO CAPS
2.0000 mg | ORAL_CAPSULE | ORAL | Status: DC | PRN
  Administered 2023-01-18 – 2023-01-19 (×2): 2 mg via ORAL
  Filled 2023-01-16 (×2): qty 1

## 2023-01-16 MED ORDER — LUMATEPERONE TOSYLATE 42 MG PO CAPS: 42.0000 mg | ORAL_CAPSULE | Freq: Every day | ORAL | Status: DC

## 2023-01-16 NOTE — Progress Notes (Signed)
[facility] MD Progress Note  01/16/2023 4:28 PM Lori [name]  MRN:  [id number]  Subjective:    Lori [name] 52 y/o female with a psychiatric history of major depressive disorder, general anxiety, bipolar disorder, substance induced mood disorder, and polysubstance abuse (alcohol, cocaine, opiates, and cannabis) was admitted to [facility] voluntarily transferred for [facility] where she initially presented with complaints of suicidal ideation and feeling hopeless.    Pt reports she is still depressed, anxious. Reports sleep is off and on. Reports latuda makes her tired so she stopped it. Reports abilify made her tired too. Reports "I was sleeping 20 hours per day, I had to stop taking it." Reports suicidal thoughts are present, passive, w/o intent or plan.  Is willing to trial caplyta as this could be a less sedating mood stabilizer. Reports susbtance use makes her more depressed and suicdial but reports feeling depressed in absence of subst use or w/d.    Principal Problem: Bipolar disorder, unspecified (HCC) Diagnosis: Principal Problem:   Bipolar disorder, unspecified (HCC) Active Problems:   Cocaine abuse (HCC)   Cannabis use disorder   Polysubstance abuse (HCC)   Generalized anxiety disorder   Suicidal ideation  Total Time spent with patient: 20 minutes  Past Psychiatric History: See H&P   Past Medical History:  Past Medical History:  Diagnosis Date   Abnormal Pap smear    Had Cryo to treat   Allergy    Anemia    Anxiety    Cluster headaches    HIV infection (HCC)    Hypertension    Lung nodule    Vaginal venereal warts     Past Surgical History:  Procedure Laterality Date   ABDOMINAL HYSTERECTOMY     KNEE ARTHROSCOPY Left    bakers cyst removal   TUBAL LIGATION     Family History:  Family History  Problem Relation Age of Onset   CAD Mother        MI 2016   Uterine cancer Mother    Cervical cancer Mother    Colon cancer Mother    Lung cancer Maternal  Grandmother    Cancer Maternal Grandfather        larynx   Prostate cancer Maternal Grandfather    Prostate cancer Father    Breast cancer Neg Hx    Liver disease Neg Hx    Esophageal cancer Neg Hx    Stomach cancer Neg Hx    Colon polyps Neg Hx    Family Psychiatric  History: See H&P   Social History:  Social History   Substance and Sexual Activity  Alcohol Use Yes     Social History   Substance and Sexual Activity  Drug Use Yes   Frequency: 7.0 times per week   Types: Marijuana, Cocaine   Comment: marijuana daily, cocaine as needed    Social History   Socioeconomic History   Marital status: Single    Spouse name: Not on file   Number of children: Not on file   Years of education: Not on file   Highest education level: Not on file  Occupational History   Not on file  Tobacco Use   Smoking status: Every Day    Current packs/day: 1.00    Average packs/day: 1 pack/day for 27.0 years (27.0 ttl pk-yrs)    Types: Cigarettes   Smokeless tobacco: Never   Tobacco comments:    patient not ready to quit; considering patches  Vaping Use   Vaping  status: Former   Substances: Nicotine, THC  Substance and Sexual Activity   Alcohol use: Yes   Drug use: Yes    Frequency: 7.0 times per week    Types: Marijuana, Cocaine    Comment: marijuana daily, cocaine as needed   Sexual activity: Yes    Birth control/protection: Surgical, Condom    Comment: pt. declined condoms  Other Topics Concern   Not on file  Social History Narrative   Not on file   Social Determinants of Health   Financial Resource Strain: Low Risk  (02/04/2020)   Overall Financial Resource Strain (CARDIA)    Difficulty of Paying Living Expenses: Not very hard  Food Insecurity: No Food Insecurity (11/02/2022)   Hunger Vital Sign    Worried About Running Out of Food in the Last Year: Never true    Ran Out of Food in the Last Year: Never true  Transportation Needs: Unmet Transportation Needs (11/02/2022)    PRAPARE - Transportation    Lack of Transportation (Medical): Yes    Lack of Transportation (Non-Medical): Yes  Physical Activity: Inactive (02/04/2020)   Exercise Vital Sign    Days of Exercise per Week: 0 days    Minutes of Exercise per Session: 0 min  Stress: Stress Concern Present (02/04/2020)   [employer] of Occupational Health - Occupational Stress Questionnaire    Feeling of Stress : Very much  Social Connections: Moderately Isolated (02/04/2020)   Social Connection and Isolation Panel [NHANES]    Frequency of Communication with Friends and Family: More than three times a week    Frequency of Social Gatherings with Friends and Family: Never    Attends Religious Services: 1 to 4 times per year    Active Member of [employer] or Organizations: No    Attends Engineer, structural: Never    Marital Status: Never married   Additional Social History:                          Current Medications: Current Facility-Administered Medications  Medication Dose Route Frequency Provider Last Rate Last Admin   acetaminophen (TYLENOL) tablet 650 mg  650 mg Oral Q6H PRN Penn, Cranston Neighbor, NP   650 mg at 01/16/23 1258   alum & mag hydroxide-simeth (MAALOX/MYLANTA) 200-200-20 MG/5ML suspension 30 mL  30 mL Oral Q4H PRN Penn, Cranston Neighbor, NP       bictegravir-emtricitabine-tenofovir AF (BIKTARVY) 50-200-25 MG per tablet 1 tablet  1 tablet Oral Daily Penn, Cicely, NP   1 tablet at 01/16/23 0825   hydrOXYzine (ATARAX) tablet 50 mg  50 mg Oral TID PRN Mcneil Sober, NP   50 mg at 01/16/23 0825   lisinopril (ZESTRIL) tablet 10 mg  10 mg Oral Daily Penn, Cranston Neighbor, NP   10 mg at 01/16/23 0825   loperamide (IMODIUM) capsule 2 mg  2 mg Oral Q4H PRN Jonavon Trieu, Harrold Donath, MD       lurasidone (LATUDA) tablet 20 mg  20 mg Oral Q supper Rankin, Shuvon B, NP       magnesium hydroxide (MILK OF MAGNESIA) suspension 30 mL  30 mL Oral Daily PRN Penn, Cicely, NP       nicotine (NICODERM CQ - dosed in mg/24 hours)  patch 14 mg  14 mg Transdermal Q0600 Klani Caridi, Harrold Donath, MD   14 mg at 01/16/23 0824   traZODone (DESYREL) tablet 50 mg  50 mg Oral QHS PRN Mcneil Sober, NP   50 mg at 01/15/23 2129  Lab Results:  Results for orders placed or performed during the hospital encounter of 01/14/23 (from the past 48 hour(s))  Basic metabolic panel     Status: None   Collection Time: 01/16/23  6:51 AM  Result Value Ref Range   Sodium 141 135 - 145 mmol/L   Potassium 3.7 3.5 - 5.1 mmol/L   Chloride 108 98 - 111 mmol/L   CO2 24 22 - 32 mmol/L   Glucose, Bld 93 70 - 99 mg/dL    Comment: Glucose reference range applies only to samples taken after fasting for at least 8 hours.   BUN 18 6 - 20 mg/dL   Creatinine, Ser 7.82 0.44 - 1.00 mg/dL   Calcium 9.0 8.9 - 95.6 mg/dL   GFR, Estimated >21 >30 mL/min    Comment: (NOTE) Calculated using the CKD-EPI Creatinine Equation (2021)    Anion gap 9 5 - 15    Comment: Performed at [facility], [address], [place], [place] [address]    Blood Alcohol level:  Lab Results  Component Value Date   [facility] <10 01/14/2023   ETH <10 05/15/2022    Metabolic Disorder Labs: Lab Results  Component Value Date   HGBA1C 5.6 05/17/2022   MPG 114 05/17/2022   No results found for: "PROLACTIN" Lab Results  Component Value Date   CHOL 195 05/17/2022   TRIG 182 (H) 05/17/2022   HDL 38 (L) 05/17/2022   CHOLHDL 5.1 05/17/2022   VLDL 36 05/17/2022   LDLCALC 121 (H) 05/17/2022   LDLCALC 172 (H) 10/30/2020    Physical Findings: AIMS:  , ,  ,  ,    CIWA:    COWS:     Musculoskeletal: Strength & Muscle Tone: within normal limits Gait & Station: normal Patient leans: N/A  Psychiatric Specialty Exam:  Presentation  General Appearance:  Casual; Fairly Groomed  Eye Contact: Good  Speech: Normal Rate; Clear and Coherent  Speech Volume: Normal  Handedness: Right   Mood and Affect  Mood: Anxious;  Depressed  Affect: Congruent   Thought Process  Thought Processes: Linear  Descriptions of Associations:Intact  Orientation:Full (Time, Place and Person)  Thought Content:Logical  History of Schizophrenia/Schizoaffective disorder:No  Duration of Psychotic Symptoms:No data recorded Hallucinations:Hallucinations: None  Ideas of Reference:None  Suicidal Thoughts:Suicidal Thoughts: Yes, Passive SI Passive Intent and/or Plan: Without Plan; Without Intent  Homicidal Thoughts:Homicidal Thoughts: No   Sensorium  Memory: Immediate Good; Recent Good; Remote Good  Judgment: Poor  Insight: Poor   Executive Functions  Concentration: Fair  Attention Span: Fair  Recall: Good  Fund of Knowledge: Good  Language: Good   Psychomotor Activity  Psychomotor Activity: Psychomotor Activity: Normal   Assets  Assets: Desire for Improvement; Communication Skills   Sleep  Sleep: Sleep: Fair    Physical Exam: Physical Exam Vitals reviewed.  Constitutional:      Appearance: She is normal weight.  Pulmonary:     Effort: Pulmonary effort is normal.  Neurological:     Mental Status: She is alert.     Motor: No weakness.     Gait: Gait normal.  Psychiatric:        Behavior: Behavior normal.        Thought Content: Thought content normal.        Judgment: Judgment normal.    Review of Systems  Constitutional:  Negative for chills and fever.  Cardiovascular:  Negative for chest pain and palpitations.  Neurological:  Negative for dizziness, tingling,  tremors and headaches.  Psychiatric/Behavioral:  Positive for depression, substance abuse and suicidal ideas. Negative for hallucinations and memory loss. The patient is nervous/anxious. The patient does not have insomnia.   All other systems reviewed and are negative.  Blood pressure 115/85, pulse 79, temperature 98.2 F (36.8 C), temperature source Oral, resp. rate 18, height 5\' 3"  (1.6 m), weight 78.9 kg,  SpO2 98%. Body mass index is 30.82 kg/m.   Treatment Plan Summary: Daily contact with patient to assess and evaluate symptoms and progress in treatment and Medication management  ASSESSMENT:  Diagnoses / Active Problems: Bipolar disorder, unspecified (HCC)   Hypertension   Cocaine abuse (HCC)   Cannabis use disorder   Chronic post-traumatic stress disorder (PTSD)   Generalized anxiety disorder  PLAN: Safety and Monitoring:  --  Voluntary admission to inpatient psychiatric unit for safety, stabilization and treatment  Pt rescinded her 72hr form today   -- Daily contact with patient to assess and evaluate symptoms and progress in treatment  -- Patient's case to be discussed in multi-disciplinary team meeting  -- Observation Level : q15 minute checks  -- Vital signs:  q12 hours  -- Precautions: suicide, elopement, and assault  2. Psychiatric Diagnoses and Treatment:    -stop latuda due to sedation -does not want to restart modafinil bc insurance would not cover last time -start capylta 42 mg every day for bipolar disorder  --  The risks/benefits/side-effects/alternatives to this medication were discussed in detail with the patient and time was given for questions. The patient consents to medication trial.    -- Metabolic profile and EKG monitoring obtained while on an atypical antipsychotic (BMI: Lipid Panel: HbgA1c: QTc:)   -- Encouraged patient to participate in unit milieu and in scheduled group therapies       3. Medical Issues Being Addressed:   Tobacco Use Disorder  -- Nicotine patch 21mg /24 hours ordered  -- Smoking cessation encouraged  4. Discharge Planning:   -- Social work and case management to assist with discharge planning and identification of hospital follow-up needs prior to discharge  -- Estimated LOS: 5-7 days  -- Discharge Concerns: Need to establish a safety plan; Medication compliance and effectiveness  -- Discharge Goals: Return home with  outpatient referrals for mental health follow-up including medication management/psychotherapy      [name] Hilts, MD 01/16/2023, 4:28 PM  Total Time Spent in Direct Patient Care:  I personally spent 35 minutes on the unit in direct patient care. The direct patient care time included face-to-face time with the patient, reviewing the patient's chart, communicating with other professionals, and coordinating care. Greater than 50% of this time was spent in counseling or coordinating care with the patient regarding goals of hospitalization, psycho-education, and discharge planning needs.   Phineas Inches, MD Psychiatrist

## 2023-01-16 NOTE — Progress Notes (Signed)
UA obtained and sent to lab.

## 2023-01-16 NOTE — [facility] Group Notes (Signed)
[facility] Group Notes:  (Nursing)  Date:  01/16/2023  Time:  1400  Type of Therapy:  Psychoeducational Skills  Participation Level:  Did Not Attend   Shela Nevin 01/16/2023, 4:05 PM

## 2023-01-16 NOTE — [facility] Suicide Risk Assessment (Signed)
[facility] INPATIENT:  Family/Significant Other Suicide Prevention Education  Suicide Prevention Education:  Patient Refusal for Family/Significant Other Suicide Prevention Education: The patient Lori [name] has refused to provide written consent for family/significant other to be provided Family/Significant Other Suicide Prevention Education during admission and/or prior to discharge.  Physician notified.  Veva Holes, LCSWA 01/16/2023, 8:51 AM

## 2023-01-16 NOTE — Plan of Care (Signed)

## 2023-01-16 NOTE — Progress Notes (Signed)
Patient reporting diarrhea this morning and asking for depends. She reports she has IBS type symptoms and a rectal prolapse but no official diagnosis of IBS. She requests to stay in her room today. Provider aware.

## 2023-01-16 NOTE — Group Note (Signed)
LCSW Group Therapy Note   Group Date: 01/16/2023 Start Time: 1000 End Time: 1100  Type of Therapy and Topic:  Group Therapy: Gratitude  Participation Level:  Did  not attend   Description of Group:   In this group, patients shared and discussed the importance of acknowledging the elements in their lives for which they are grateful and how this can positively impact their mood.  The group discussed how bringing the positive elements of their lives to the forefront of their minds can help with recovery from any illness, physical or mental.  An Gratitude exercise was done as a group in which a list was made of songs used to  encourage participants to consider other potential positives in their lives.  Therapeutic Goals: Patients will identify one or more item for which they are grateful in each of 6 categories:  people, experiences, things, places, skills, and other. Patients will discuss how it is possible to seek out gratitude in even bad situations. Patients will explore other possible items of gratitude that they could remember.   Summary of Patient Progress:  Patient was invited to group, but decided to not attend  Therapeutic Modalities:   Solution-Focused Therapy Activity  Steffanie Dunn, LCSWA 01/16/2023  1:49 PM

## 2023-01-16 NOTE — Progress Notes (Signed)
D: Patient alert and oriented. Affect/mood reported as improving. Denies SI, HI, AVH, and pain. Patient goal, "to get my bowels under control." Pt is complaining of IBS like symptoms with loose stools.  A: Scheduled medication administered to patient, per MD orders. Support and encouragement provided. Routine safety checks conducted every 15 minutes. Patient informed to notify staff with problems or concerns.   R: No adverse drug reactions noted. Patient contracts for safety at this time. Patient compliant with medications and treatment plan. Patient receptive, calm and cooperative. Patient interacts well with others on unit. Patient remains safe at this time.

## 2023-01-17 ENCOUNTER — Encounter (HOSPITAL_COMMUNITY): Payer: Self-pay

## 2023-01-17 ENCOUNTER — Other Ambulatory Visit (HOSPITAL_COMMUNITY): Payer: Self-pay

## 2023-01-17 NOTE — Progress Notes (Signed)
Pt did not attend AA group  

## 2023-01-17 NOTE — Care Management Important Message (Signed)
Medicare IM printed and given to social work to give to the patient. ?

## 2023-01-17 NOTE — Progress Notes (Signed)
   01/16/23 2200  Psych Admission Type (Psych Patients Only)  Admission Status Voluntary/72 hour document signed  Psychosocial Assessment  Patient Complaints Depression;Substance abuse  Eye Contact Fair  Facial Expression Animated  Affect Anxious  Speech Logical/coherent  Interaction Assertive  Motor Activity Slow  Appearance/Hygiene Unremarkable  Behavior Characteristics Cooperative;Appropriate to situation  Mood Anxious  Thought Process  Coherency WDL  Content WDL  Delusions None reported or observed  Perception WDL  Hallucination None reported or observed  Judgment Impaired  Confusion None  Danger to Self  Current suicidal ideation? Denies  Self-Injurious Behavior No self-injurious ideation or behavior indicators observed or expressed   Agreement Not to Harm Self Yes  Description of Agreement verbal  Danger to Others  Danger to Others None reported or observed

## 2023-01-17 NOTE — Progress Notes (Signed)
D: Patient is alert, oriented, pleasant, and cooperative. Denies SI, HI, AVH, and verbally contracts for safety. Patient reports she slept fair last night. Patient reports her appetite as good, energy level as low, and concentration as poor. Patient rates her depression 7/10, hopelessness 8/10, and anxiety 10/10.    A: Scheduled medications administered per MD order. PRN tylenol, vistaril, and nicotine gum administered. Support provided. Patient educated on safety on the unit and medications. Routine safety checks every 15 minutes. Patient stated understanding to tell nurse about any new physical symptoms. Patient understands to tell staff of any needs.     R: No adverse drug reactions noted. Patient verbally contracts for safety. Patient remains safe at this time and will continue to monitor.    01/17/23 1100  Psych Admission Type (Psych Patients Only)  Admission Status Voluntary  Psychosocial Assessment  Patient Complaints Depression;Anxiety  Eye Contact Fair  Facial Expression Animated  Affect Anxious  Speech Logical/coherent  Interaction Assertive  Motor Activity Slow  Appearance/Hygiene Unremarkable  Behavior Characteristics Cooperative;Appropriate to situation  Mood Anxious  Thought Process  Coherency WDL  Content WDL  Delusions None reported or observed  Perception WDL  Hallucination None reported or observed  Judgment Impaired  Confusion None  Danger to Self  Current suicidal ideation? Denies  Self-Injurious Behavior No self-injurious ideation or behavior indicators observed or expressed   Agreement Not to Harm Self Yes  Description of Agreement verbal  Danger to Others  Danger to Others None reported or observed

## 2023-01-17 NOTE — Progress Notes (Signed)
[facility] MD Progress Note  01/17/2023 10:19 AM Lori [name]  MRN:  [id number]  Subjective:    Lori [name] 52 y/o female with a psychiatric history of major depressive disorder, general anxiety, bipolar disorder, substance induced mood disorder, and polysubstance abuse (alcohol, cocaine, opiates, and cannabis) was admitted to [facility] voluntarily transferred for [facility] where she initially presented with complaints of suicidal ideation and feeling hopeless.    Patient reports no change in psychiatric symptoms since yesterday.  Patient did not receive Lida last night.  I discussed with pharmacy, she will start it today. Pt reports she is still depressed, anxious. Reports sleep is off and on.  Patient reports she is tired all the time and sleeps too much.  I discussed with her, which she told me yesterday, is that Jordan and Abilify make her too tired to function.  Patient gets really irritable and agitated when confronted with the possibility that it is not the medications that are making her tired, and is other things such as medical conditions, sleep problems, the severity of her depression are causing her to have significant daytime fatigue, and not the medications.  I discussed with her also that it could be the medications making her tired, or making her daytime fatigue worse. Patient is still willing to start the Caplyta. She reports that suicidal thoughts continue, without any intent or plan to harm herself in the hospital.  She reports her suicidal thoughts are passive. Denies any psychotic symptoms.  Denies any HI.      Principal Problem: Bipolar disorder, unspecified (HCC) Diagnosis: Principal Problem:   Bipolar disorder, unspecified (HCC) Active Problems:   Cocaine abuse (HCC)   Cannabis use disorder   Polysubstance abuse (HCC)   Generalized anxiety disorder   Suicidal ideation  Total Time spent with patient: 20 minutes  Past Psychiatric History: See H&P   Past  Medical History:  Past Medical History:  Diagnosis Date   Abnormal Pap smear    Had Cryo to treat   Allergy    Anemia    Anxiety    Cluster headaches    HIV infection (HCC)    Hypertension    Lung nodule    Vaginal venereal warts     Past Surgical History:  Procedure Laterality Date   ABDOMINAL HYSTERECTOMY     KNEE ARTHROSCOPY Left    bakers cyst removal   TUBAL LIGATION     Family History:  Family History  Problem Relation Age of Onset   CAD Mother        MI 2016   Uterine cancer Mother    Cervical cancer Mother    Colon cancer Mother    Lung cancer Maternal Grandmother    Cancer Maternal Grandfather        larynx   Prostate cancer Maternal Grandfather    Prostate cancer Father    Breast cancer Neg Hx    Liver disease Neg Hx    Esophageal cancer Neg Hx    Stomach cancer Neg Hx    Colon polyps Neg Hx    Family Psychiatric  History: See H&P   Social History:  Social History   Substance and Sexual Activity  Alcohol Use Yes     Social History   Substance and Sexual Activity  Drug Use Yes   Frequency: 7.0 times per week   Types: Marijuana, Cocaine   Comment: marijuana daily, cocaine as needed    Social History   Socioeconomic History  Marital status: Single    Spouse name: Not on file   Number of children: Not on file   Years of education: Not on file   Highest education level: Not on file  Occupational History   Not on file  Tobacco Use   Smoking status: Every Day    Current packs/day: 1.00    Average packs/day: 1 pack/day for 27.0 years (27.0 ttl pk-yrs)    Types: Cigarettes   Smokeless tobacco: Never   Tobacco comments:    patient not ready to quit; considering patches  Vaping Use   Vaping status: Former   Substances: Nicotine, THC  Substance and Sexual Activity   Alcohol use: Yes   Drug use: Yes    Frequency: 7.0 times per week    Types: Marijuana, Cocaine    Comment: marijuana daily, cocaine as needed   Sexual activity: Yes     Birth control/protection: Surgical, Condom    Comment: pt. declined condoms  Other Topics Concern   Not on file  Social History Narrative   Not on file   Social Determinants of Health   Financial Resource Strain: Low Risk  (02/04/2020)   Overall Financial Resource Strain (CARDIA)    Difficulty of Paying Living Expenses: Not very hard  Food Insecurity: No Food Insecurity (11/02/2022)   Hunger Vital Sign    Worried About Running Out of Food in the Last Year: Never true    Ran Out of Food in the Last Year: Never true  Transportation Needs: Unmet Transportation Needs (11/02/2022)   PRAPARE - Transportation    Lack of Transportation (Medical): Yes    Lack of Transportation (Non-Medical): Yes  Physical Activity: Inactive (02/04/2020)   Exercise Vital Sign    Days of Exercise per Week: 0 days    Minutes of Exercise per Session: 0 min  Stress: Stress Concern Present (02/04/2020)   [employer] of Occupational Health - Occupational Stress Questionnaire    Feeling of Stress : Very much  Social Connections: Moderately Isolated (02/04/2020)   Social Connection and Isolation Panel [NHANES]    Frequency of Communication with Friends and Family: More than three times a week    Frequency of Social Gatherings with Friends and Family: Never    Attends Religious Services: 1 to 4 times per year    Active Member of [employer] or Organizations: No    Attends Engineer, structural: Never    Marital Status: Never married   Additional Social History:                          Current Medications: Current Facility-Administered Medications  Medication Dose Route Frequency Provider Last Rate Last Admin   acetaminophen (TYLENOL) tablet 650 mg  650 mg Oral Q6H PRN Penn, Cranston Neighbor, NP   650 mg at 01/17/23 0843   alum & mag hydroxide-simeth (MAALOX/MYLANTA) 200-200-20 MG/5ML suspension 30 mL  30 mL Oral Q4H PRN Penn, Cicely, NP       bictegravir-emtricitabine-tenofovir AF (BIKTARVY) 50-200-25 MG  per tablet 1 tablet  1 tablet Oral Daily Penn, Cicely, NP   1 tablet at 01/17/23 0843   hydrOXYzine (ATARAX) tablet 50 mg  50 mg Oral TID PRN Mcneil Sober, NP   50 mg at 01/17/23 0843   lisinopril (ZESTRIL) tablet 10 mg  10 mg Oral Daily Penn, Cicely, NP   10 mg at 01/17/23 0843   loperamide (IMODIUM) capsule 2 mg  2 mg Oral Q4H PRN Shahab Polhamus,  Harrold Donath, MD       lumateperone tosylate (CAPLYTA) capsule 42 mg  42 mg Oral QHS Aadhira Heffernan, MD       magnesium hydroxide (MILK OF MAGNESIA) suspension 30 mL  30 mL Oral Daily PRN Penn, Cicely, NP       nicotine (NICODERM CQ - dosed in mg/24 hours) patch 14 mg  14 mg Transdermal Q0600 Rucha Wissinger, Harrold Donath, MD   14 mg at 01/17/23 0843   traZODone (DESYREL) tablet 50 mg  50 mg Oral QHS PRN Mcneil Sober, NP   50 mg at 01/15/23 2129    Lab Results:  Results for orders placed or performed during the hospital encounter of 01/14/23 (from the past 48 hour(s))  Basic metabolic panel     Status: None   Collection Time: 01/16/23  6:51 AM  Result Value Ref Range   Sodium 141 135 - 145 mmol/L   Potassium 3.7 3.5 - 5.1 mmol/L   Chloride 108 98 - 111 mmol/L   CO2 24 22 - 32 mmol/L   Glucose, Bld 93 70 - 99 mg/dL    Comment: Glucose reference range applies only to samples taken after fasting for at least 8 hours.   BUN 18 6 - 20 mg/dL   Creatinine, Ser 4.09 0.44 - 1.00 mg/dL   Calcium 9.0 8.9 - 81.1 mg/dL   GFR, Estimated >91 >47 mL/min    Comment: (NOTE) Calculated using the CKD-EPI Creatinine Equation (2021)    Anion gap 9 5 - 15    Comment: Performed at [facility], [address] [place], [place] [address]    Blood Alcohol level:  Lab Results  Component Value Date   [facility] <10 01/14/2023   ETH <10 05/15/2022    Metabolic Disorder Labs: Lab Results  Component Value Date   HGBA1C 5.6 05/17/2022   MPG 114 05/17/2022   No results found for: "PROLACTIN" Lab Results  Component Value Date   CHOL 195 05/17/2022   TRIG 182 (H)  05/17/2022   HDL 38 (L) 05/17/2022   CHOLHDL 5.1 05/17/2022   VLDL 36 05/17/2022   LDLCALC 121 (H) 05/17/2022   LDLCALC 172 (H) 10/30/2020    Physical Findings: AIMS:  , ,  ,  ,    CIWA:    COWS:     Aims score zero on my exam. No eps on my exam.   Musculoskeletal: Strength & Muscle Tone: within normal limits Gait & Station: normal Patient leans: N/A  Psychiatric Specialty Exam:  Presentation  General Appearance:  Casual; Fairly Groomed  Eye Contact: Good  Speech: Normal Rate; Clear and Coherent  Speech Volume: Normal  Handedness: Right   Mood and Affect  Mood: Anxious; Depressed  Affect: Congruent   Thought Process  Thought Processes: Linear  Descriptions of Associations:Intact  Orientation:Full (Time, Place and Person)  Thought Content:Logical  History of Schizophrenia/Schizoaffective disorder:No  Duration of Psychotic Symptoms:No data recorded Hallucinations:Hallucinations: None  Ideas of Reference:None  Suicidal Thoughts:Suicidal Thoughts: Yes, Passive SI Passive Intent and/or Plan: Without Plan; Without Intent  Homicidal Thoughts:Homicidal Thoughts: No   Sensorium  Memory: Immediate Good; Recent Good; Remote Good  Judgment: Poor  Insight: Poor   Executive Functions  Concentration: Fair  Attention Span: Fair  Recall: Good  Fund of Knowledge: Good  Language: Good   Psychomotor Activity  Psychomotor Activity: Psychomotor Activity: Normal   Assets  Assets: Desire for Improvement; Communication Skills   Sleep  Sleep: Sleep: Fair    Physical Exam:  Physical Exam Vitals reviewed.  Constitutional:      Appearance: She is normal weight.  Pulmonary:     Effort: Pulmonary effort is normal.  Neurological:     Mental Status: She is alert.     Motor: No weakness.     Gait: Gait normal.  Psychiatric:        Behavior: Behavior normal.        Thought Content: Thought content normal.        Judgment:  Judgment normal.    Review of Systems  Constitutional:  Negative for chills and fever.  Cardiovascular:  Negative for chest pain and palpitations.  Neurological:  Negative for dizziness, tingling, tremors and headaches.  Psychiatric/Behavioral:  Positive for depression, substance abuse and suicidal ideas. Negative for hallucinations and memory loss. The patient is nervous/anxious. The patient does not have insomnia.   All other systems reviewed and are negative.  Blood pressure 128/89, pulse (!) 43, temperature 97.9 F (36.6 C), temperature source Oral, resp. rate 20, height 5\' 3"  (1.6 m), weight 78.9 kg, SpO2 99%. Body mass index is 30.82 kg/m.   Treatment Plan Summary: Daily contact with patient to assess and evaluate symptoms and progress in treatment and Medication management  ASSESSMENT:  Diagnoses / Active Problems: Bipolar disorder, unspecified (HCC)   Hypertension   Cocaine abuse (HCC)   Cannabis use disorder   Chronic post-traumatic stress disorder (PTSD)   Generalized anxiety disorder  PLAN: Safety and Monitoring:  --  Voluntary admission to inpatient psychiatric unit for safety, stabilization and treatment  Pt rescinded her 72hr form today   -- Daily contact with patient to assess and evaluate symptoms and progress in treatment  -- Patient's case to be discussed in multi-disciplinary team meeting  -- Observation Level : q15 minute checks  -- Vital signs:  q12 hours  -- Precautions: suicide, elopement, and assault  2. Psychiatric Diagnoses and Treatment:    -Previously stopped latuda due to sedation -does not want to restart modafinil bc insurance would not cover last time -start capylta 42 mg every day for bipolar disorder  --  The risks/benefits/side-effects/alternatives to this medication were discussed in detail with the patient and time was given for questions. The patient consents to medication trial.    -- Metabolic profile and EKG monitoring obtained  while on an atypical antipsychotic (BMI: Lipid Panel: HbgA1c: QTc:)   -- Encouraged patient to participate in unit milieu and in scheduled group therapies       3. Medical Issues Being Addressed:   Tobacco Use Disorder  -- Nicotine patch 21mg /24 hours ordered  -- Smoking cessation encouraged  4. Discharge Planning:   -- Social work and case management to assist with discharge planning and identification of hospital follow-up needs prior to discharge  -- Estimated LOS: 5-7 days  -- Discharge Concerns: Need to establish a safety plan; Medication compliance and effectiveness  -- Discharge Goals: Return home with outpatient referrals for mental health follow-up including medication management/psychotherapy     [name] Hilts, MD 01/17/2023, 10:19 AM  Total Time Spent in Direct Patient Care:  I personally spent 35 minutes on the unit in direct patient care. The direct patient care time included face-to-face time with the patient, reviewing the patient's chart, communicating with other professionals, and coordinating care. Greater than 50% of this time was spent in counseling or coordinating care with the patient regarding goals of hospitalization, psycho-education, and discharge planning needs.   Phineas Inches, MD Psychiatrist

## 2023-01-17 NOTE — BHH Group Notes (Signed)
Adult Psychoeducational Group Note  Date:  01/17/2023 Time:  11:14 AM  Group Topic/Focus:  Goals Group:   The focus of this group is to help patients establish daily goals to achieve during treatment and discuss how the patient can incorporate goal setting into their daily lives to aide in recovery. Managing Feelings:   The focus of this group is to identify what feelings patients have difficulty handling and develop a plan to handle them in a healthier way upon discharge.  Participation Level:  Minimal  Participation Quality:  Appropriate  Affect:  Appropriate  Cognitive:  Appropriate  Insight: Good  Engagement in Group:  Limited  Modes of Intervention:  Discussion, Education, and Exploration  Additional Comments:  Pt participated in group. Pt did not say much during group. Pt actively listened as group discussed self-worth, self-awareness. Pt's discussed the barriers faced when discharging along with the emotions, feelings and the uncertainty.       Damyah Gugel 01/17/2023, 11:14 AM

## 2023-01-17 NOTE — BH IP Treatment Plan (Signed)
Interdisciplinary Treatment and Diagnostic Plan Update  01/17/2023 Time of Session: 1100am Lori [name] MRN: [id number]  Principal Diagnosis: Bipolar disorder, unspecified (HCC)  Secondary Diagnoses: Principal Problem:   Bipolar disorder, unspecified (HCC) Active Problems:   Cocaine abuse (HCC)   Cannabis use disorder   Polysubstance abuse (HCC)   Generalized anxiety disorder   Suicidal ideation   Current Medications:  Current Facility-Administered Medications  Medication Dose Route Frequency Provider Last Rate Last Admin   acetaminophen (TYLENOL) tablet 650 mg  650 mg Oral Q6H PRN Penn, Cranston Neighbor, NP   650 mg at 01/17/23 0843   alum & mag hydroxide-simeth (MAALOX/MYLANTA) 200-200-20 MG/5ML suspension 30 mL  30 mL Oral Q4H PRN Penn, Cranston Neighbor, NP       bictegravir-emtricitabine-tenofovir AF (BIKTARVY) 50-200-25 MG per tablet 1 tablet  1 tablet Oral Daily Penn, Cranston Neighbor, NP   1 tablet at 01/17/23 0843   hydrOXYzine (ATARAX) tablet 50 mg  50 mg Oral TID PRN Mcneil Sober, NP   50 mg at 01/17/23 0843   lisinopril (ZESTRIL) tablet 10 mg  10 mg Oral Daily Penn, Cranston Neighbor, NP   10 mg at 01/17/23 8295   loperamide (IMODIUM) capsule 2 mg  2 mg Oral Q4H PRN Massengill, Harrold Donath, MD       lumateperone tosylate (CAPLYTA) capsule 42 mg  42 mg Oral QHS Massengill, Nathan, MD       magnesium hydroxide (MILK OF MAGNESIA) suspension 30 mL  30 mL Oral Daily PRN Penn, Cicely, NP       nicotine (NICODERM CQ - dosed in mg/24 hours) patch 14 mg  14 mg Transdermal Q0600 Massengill, Harrold Donath, MD   14 mg at 01/17/23 0843   traZODone (DESYREL) tablet 50 mg  50 mg Oral QHS PRN Mcneil Sober, NP   50 mg at 01/15/23 2129   PTA Medications: Medications Prior to Admission  Medication Sig Dispense Refill Last Dose   bictegravir-emtricitabine-tenofovir AF (BIKTARVY) 50-200-25 MG TABS tablet Take 1 tablet by mouth daily. 30 tablet 1    lisinopril (ZESTRIL) 10 MG tablet Take 1 tablet (10 mg total) by mouth daily. 30 tablet 0      Patient Stressors:    Patient Strengths:    Treatment Modalities: Medication Management, Group therapy, Case management,  1 to 1 session with clinician, Psychoeducation, Recreational therapy.   Physician Treatment Plan for Primary Diagnosis: Bipolar disorder, unspecified (HCC) Long Term Goal(s): Improvement in symptoms so as ready for discharge   Short Term Goals: Ability to identify changes in lifestyle to reduce recurrence of condition will improve Ability to verbalize feelings will improve Ability to disclose and discuss suicidal ideas Ability to demonstrate self-control will improve Ability to identify and develop effective coping behaviors will improve Ability to maintain clinical measurements within normal limits will improve Compliance with prescribed medications will improve Ability to identify triggers associated with substance abuse/mental health issues will improve  Medication Management: Evaluate patient's response, side effects, and tolerance of medication regimen.  Therapeutic Interventions: 1 to 1 sessions, Unit Group sessions and Medication administration.  Evaluation of Outcomes: Progressing  Physician Treatment Plan for Secondary Diagnosis: Principal Problem:   Bipolar disorder, unspecified (HCC) Active Problems:   Cocaine abuse (HCC)   Cannabis use disorder   Polysubstance abuse (HCC)   Generalized anxiety disorder   Suicidal ideation  Long Term Goal(s): Improvement in symptoms so as ready for discharge   Short Term Goals: Ability to identify changes in lifestyle to reduce recurrence of condition will improve Ability to  verbalize feelings will improve Ability to disclose and discuss suicidal ideas Ability to demonstrate self-control will improve Ability to identify and develop effective coping behaviors will improve Ability to maintain clinical measurements within normal limits will improve Compliance with prescribed medications will  improve Ability to identify triggers associated with substance abuse/mental health issues will improve     Medication Management: Evaluate patient's response, side effects, and tolerance of medication regimen.  Therapeutic Interventions: 1 to 1 sessions, Unit Group sessions and Medication administration.  Evaluation of Outcomes: Progressing   RN Treatment Plan for Primary Diagnosis: Bipolar disorder, unspecified (HCC) Long Term Goal(s): Knowledge of disease and therapeutic regimen to maintain health will improve  Short Term Goals: Ability to remain free from injury will improve, Ability to verbalize frustration and anger appropriately will improve, Ability to demonstrate self-control, Ability to participate in decision making will improve, Ability to verbalize feelings will improve, Ability to disclose and discuss suicidal ideas, Ability to identify and develop effective coping behaviors will improve, and Compliance with prescribed medications will improve  Medication Management: RN will administer medications as ordered by provider, will assess and evaluate patient's response and provide education to patient for prescribed medication. RN will report any adverse and/or side effects to prescribing provider.  Therapeutic Interventions: 1 on 1 counseling sessions, Psychoeducation, Medication administration, Evaluate responses to treatment, Monitor vital signs and CBGs as ordered, Perform/monitor CIWA, COWS, AIMS and Fall Risk screenings as ordered, Perform wound care treatments as ordered.  Evaluation of Outcomes: Progressing   LCSW Treatment Plan for Primary Diagnosis: Bipolar disorder, unspecified (HCC) Long Term Goal(s): Safe transition to appropriate next level of care at discharge, Engage patient in therapeutic group addressing interpersonal concerns.  Short Term Goals: Engage patient in aftercare planning with referrals and resources, Increase social support, Increase ability to  appropriately verbalize feelings, Increase emotional regulation, Facilitate acceptance of mental health diagnosis and concerns, Facilitate patient progression through stages of change regarding substance use diagnoses and concerns, Identify triggers associated with mental health/substance abuse issues, and Increase skills for wellness and recovery  Therapeutic Interventions: Assess for all discharge needs, 1 to 1 time with Social worker, Explore available resources and support systems, Assess for adequacy in community support network, Educate family and significant other(s) on suicide prevention, Complete Psychosocial Assessment, Interpersonal group therapy.  Evaluation of Outcomes: Progressing   Progress in Treatment: Attending groups: Yes. Participating in groups: Yes. Taking medication as prescribed: Yes. Toleration medication: Yes. Family/Significant other contact made: No, will contact:  declined consents Patient understands diagnosis: Yes. Discussing patient identified problems/goals with staff: Yes. Medical problems stabilized or resolved: Yes. Denies suicidal/homicidal ideation: Yes. Issues/concerns per patient self-inventory: No. Other: none reported  New problem(s) identified: No, Describe:  none reported  New Short Term/Long Term Goal(s):medication management for mood stabilization; elimination of SI thoughts; development of comprehensive mental wellness/sobriety plan    Patient Goals:  better medication management  Discharge Plan or Barriers: Patient recently admitted. CSW will continue to follow and assess for appropriate referrals and possible discharge planning.      Reason for Continuation of Hospitalization: Anxiety Depression Medication stabilization Suicidal ideation  Estimated Length of Stay:3-7 days  Last 3 [place] Suicide Severity Risk Score: Flowsheet Row ED from 01/14/2023 in [facility] Emergency Department at [facility] Admission (Discharged) from  [facility]01/2022 in [facility] Department at [facility]  C-SSRS RISK CATEGORY High Risk High Risk High Risk  Last PHQ 2/9 Scores:    08/28/2021   10:32 AM 11/27/2020    3:34 PM 10/30/2020    3:30 PM  Depression screen PHQ 2/9  Decreased Interest 0 0 0  Down, Depressed, Hopeless 1 0 0  PHQ - 2 Score 1 0 0    Scribe for Treatment Team: Starleen Arms, [place] 01/17/2023 1:16 PM

## 2023-01-17 NOTE — Group Note (Signed)
Recreation Therapy Group Note   Group Topic:Relaxation  Group Date: 01/17/2023 Start Time: 0945 End Time: 1005 Facilitators: Tnia Anglada-McCall, LRT,CTRS Location: [address] Dayroom   Goal Area(s) Addresses:  Patient will identify positive stress management techniques. Patient will identify benefits of using stress management post d/c.  Group Description: Meditation. LRT discussed meditation with group. LRT then played a meditation that focused identifying those things that are holding you back and how to overcome them. Patients were to get as comfortable as possible and focus on the content of the meditation. LRT informed patients they could find other meditations and stress management techniques from apps, Youtube, scripts, etc.   Affect/Mood: N/A   Participation Level: Did not attend    Clinical Observations/Individualized Feedback:     Plan: Continue to engage patient in RT group sessions 2-3x/week.   Zacharius Funari-McCall, LRT,CTRS 01/17/2023 11:58 AM

## 2023-01-17 NOTE — TOC Benefit Eligibility Note (Signed)
Pharmacy Patient Advocate Encounter  Insurance verification completed.    The patient is insured through [facility] Medicare Part D  Ran test claim for Caplyta 42 mg capsule and the current 30 day co-pay is $0.00.   This test claim was processed through [facility]- copay amounts may vary at other pharmacies due to pharmacy/plan contracts, or as the patient moves through the different stages of their insurance plan.    Roland Earl, CPHT Pharmacy Patient Advocate Specialist [facility] Patient Advocate Team Direct Number: [phone]  Fax: [phone]

## 2023-01-17 NOTE — Plan of Care (Signed)
  Problem: Education: Goal: Emotional status will improve Outcome: Progressing Goal: Mental status will improve Outcome: Progressing   

## 2023-01-17 NOTE — Progress Notes (Signed)
   01/17/23 0538  15 Minute Checks  Location Bedroom  Visual Appearance Calm  Behavior Sleeping  Sleep (Behavioral Health Patients Only)  Calculate sleep? (Click Yes once per 24 hr at 0600 safety check) Yes  Documented sleep last 24 hours 7.25

## 2023-01-18 NOTE — Progress Notes (Signed)
  On assessment, patient is alert and oriented.  Patient is moderately anxious.  Patient denies pain.  Patient denies SI/HI/AVH.  Patient is medication compliant.  Patient is safe on the unit with q 15 minute safety checks.

## 2023-01-18 NOTE — Progress Notes (Signed)
   01/18/23 0557  15 Minute Checks  Location Bedroom  Visual Appearance Calm  Behavior Sleeping  Sleep (Behavioral Health Patients Only)  Calculate sleep? (Click Yes once per 24 hr at 0600 safety check) Yes  Documented sleep last 24 hours 8

## 2023-01-18 NOTE — Progress Notes (Signed)
D: Patient is alert, oriented, pleasant, and cooperative. Denies SI, HI, AVH, and verbally contracts for safety. Patient reports she slept poor last night due to nightmares. Patient reports her appetite as good, energy level as low, and concentration as poor. Patient rates her depression 7/10, hopelessness 7/10, and anxiety 7/10. Patient reports abdominal pain/discomfort.    A: Scheduled medications administered per MD order. PRN tylenol and imodium administered. Support provided. Patient educated on safety on the unit and medications. Routine safety checks every 15 minutes. Patient stated understanding to tell nurse about any new physical symptoms. Patient understands to tell staff of any needs.     R: No adverse drug reactions noted. Patient verbally contracts for safety. Patient remains safe at this time and will continue to monitor.    01/18/23 0900  Psych Admission Type (Psych Patients Only)  Admission Status Voluntary  Psychosocial Assessment  Patient Complaints Depression;Anxiety  Eye Contact Fair  Facial Expression Animated  Affect Anxious  Speech Logical/coherent  Interaction Assertive  Motor Activity Slow  Appearance/Hygiene Unremarkable  Behavior Characteristics Cooperative;Appropriate to situation  Mood Anxious  Thought Process  Coherency WDL  Content WDL  Delusions None reported or observed  Perception WDL  Hallucination None reported or observed  Judgment Impaired  Confusion None  Danger to Self  Current suicidal ideation? Denies  Self-Injurious Behavior No self-injurious ideation or behavior indicators observed or expressed   Agreement Not to Harm Self Yes  Description of Agreement verbal  Danger to Others  Danger to Others None reported or observed

## 2023-01-18 NOTE — [facility] Group Notes (Signed)
Spiritual care group on grief and loss facilitated by chaplain Viki Carrera MDiv, B[facility]  Group Goal:  Support / Education around grief and loss Members engage in facilitated group support and psycho-social education.  Group Description:  Following introductions and group rules, group members engaged in facilitated group dialog and support around topic of loss, with particular support around experiences of loss in their lives. Group Identified types of loss (relationships / self / things) and identified patterns, circumstances, and changes that precipitate losses. Reflected on thoughts / feelings around loss, normalized grief responses, and recognized variety in grief experience.   Group noted Worden's four tasks of grief in discussion.  Group drew on Adlerian / Rogerian, narrative, MI, Patient Progress:  DID NOT ATTEND  

## 2023-01-18 NOTE — Plan of Care (Signed)
  Problem: Education: Goal: Emotional status will improve Outcome: Progressing Goal: Mental status will improve Outcome: Progressing   Problem: Activity: Goal: Interest or engagement in activities will improve Outcome: Progressing   Problem: Coping: Goal: Ability to demonstrate self-control will improve Outcome: Progressing   

## 2023-01-18 NOTE — Group Note (Signed)
Recreation Therapy Group Note   Group Topic:Animal Assisted Therapy   Group Date: 01/18/2023 Start Time: 0950 End Time: 1030 Facilitators: Ndeye Tenorio-McCall, LRT,CTRS Location: [address] Dayroom   Animal-Assisted Activity (AAA) Program Checklist/Progress Notes Patient Eligibility Criteria Checklist & Daily Group note for Rec Tx Intervention  AAA/T Program Assumption of Risk Form signed by Patient/ or Parent Legal Guardian Yes  Patient understands his/her participation is voluntary Yes   Affect/Mood: N/A   Participation Level: Did not attend    Clinical Observations/Individualized Feedback:     Plan: Continue to engage patient in RT group sessions 2-3x/week.   Jennesis Ramaswamy-McCall, LRT,CTRS  01/18/2023 12:32 PM

## 2023-01-18 NOTE — Progress Notes (Signed)
[facility] MD Progress Note  01/18/2023 2:56 PM [name]  MRN:  [id number] Subjective:   Lori [name] is a 52 yr old female presented to  [facility] with complaints of suicidal ideation and feeling hopeless.   PPHx is significant for Bipolar Disorder, GAD, Depression, Substance Induced Mood Disorder, and Polysubstance Abuse (EtOH, Cocaine, Opiates, and THC), and 3 Suicide Attempts (last OD on Lisinopril-2020) and 2 Prior Psychiatric Hospitalizations.   Case was discussed in the multidisciplinary team. MAR was reviewed and patient was compliant with medications.  She received Trazodone and Hydroxyzine last night.   Psychiatric Team made the following recommendations yesterday: -Start Capylta 42 mg QHS for Bipolar Disorder     On interview today patient reports she slept fair last night, reports multiple nightmares.  She reports her appetite is doing good.  She reports no SI, HI, or AVH.  She reports no Paranoia or Ideas of Reference.  She reports no certain issues with her medications.  She does report having significant nightmares last night which is not normal for her.  She reports she is willing to continue with the medication and see if she is able to sleep better tonight.  She reports no other concerns at present.   Principal Problem: Bipolar disorder, unspecified (HCC) Diagnosis: Principal Problem:   Bipolar disorder, unspecified (HCC) Active Problems:   Cocaine abuse (HCC)   Cannabis use disorder   Polysubstance abuse (HCC)   Generalized anxiety disorder   Suicidal ideation  Total Time spent with patient:  I personally spent 35 minutes on the unit in direct patient care. The direct patient care time included face-to-face time with the patient, reviewing the patient's chart, communicating with other professionals, and coordinating care. Greater than 50% of this time was spent in counseling or coordinating care with the patient regarding goals of hospitalization,  psycho-education, and discharge planning needs.   Past Psychiatric History: Bipolar Disorder, GAD, Depression, Substance Induced Mood Disorder, and Polysubstance Abuse (EtOH, Cocaine, Opiates, and THC), and 3 Suicide Attempts (last OD on Lisinopril-2020) and 2 Prior Psychiatric Hospitalizations.  Past Medical History:  Past Medical History:  Diagnosis Date   Abnormal Pap smear    Had Cryo to treat   Allergy    Anemia    Anxiety    Cluster headaches    HIV infection (HCC)    Hypertension    Lung nodule    Vaginal venereal warts     Past Surgical History:  Procedure Laterality Date   ABDOMINAL HYSTERECTOMY     KNEE ARTHROSCOPY Left    bakers cyst removal   TUBAL LIGATION     Family History:  Family History  Problem Relation Age of Onset   CAD Mother        MI 2016   Uterine cancer Mother    Cervical cancer Mother    Colon cancer Mother    Lung cancer Maternal Grandmother    Cancer Maternal Grandfather        larynx   Prostate cancer Maternal Grandfather    Prostate cancer Father    Breast cancer Neg Hx    Liver disease Neg Hx    Esophageal cancer Neg Hx    Stomach cancer Neg Hx    Colon polyps Neg Hx    Family Psychiatric  History: None Reported Social History:  Social History   Substance and Sexual Activity  Alcohol Use Yes     Social History   Substance and Sexual Activity  Drug Use  Yes   Frequency: 7.0 times per week   Types: Marijuana, Cocaine   Comment: marijuana daily, cocaine as needed    Social History   Socioeconomic History   Marital status: Single    Spouse name: Not on file   Number of children: Not on file   Years of education: Not on file   Highest education level: Not on file  Occupational History   Not on file  Tobacco Use   Smoking status: Every Day    Current packs/day: 1.00    Average packs/day: 1 pack/day for 27.0 years (27.0 ttl pk-yrs)    Types: Cigarettes   Smokeless tobacco: Never   Tobacco comments:    patient not  ready to quit; considering patches  Vaping Use   Vaping status: Former   Substances: Nicotine, THC  Substance and Sexual Activity   Alcohol use: Yes   Drug use: Yes    Frequency: 7.0 times per week    Types: Marijuana, Cocaine    Comment: marijuana daily, cocaine as needed   Sexual activity: Yes    Birth control/protection: Surgical, Condom    Comment: pt. declined condoms  Other Topics Concern   Not on file  Social History Narrative   Not on file   Social Determinants of Health   Financial Resource Strain: Low Risk  (02/04/2020)   Overall Financial Resource Strain (CARDIA)    Difficulty of Paying Living Expenses: Not very hard  Food Insecurity: No Food Insecurity (11/02/2022)   Hunger Vital Sign    Worried About Running Out of Food in the Last Year: Never true    Ran Out of Food in the Last Year: Never true  Transportation Needs: Unmet Transportation Needs (11/02/2022)   PRAPARE - Transportation    Lack of Transportation (Medical): Yes    Lack of Transportation (Non-Medical): Yes  Physical Activity: Inactive (02/04/2020)   Exercise Vital Sign    Days of Exercise per Week: 0 days    Minutes of Exercise per Session: 0 min  Stress: Stress Concern Present (02/04/2020)   [employer] of Occupational Health - Occupational Stress Questionnaire    Feeling of Stress : Very much  Social Connections: Moderately Isolated (02/04/2020)   Social Connection and Isolation Panel [NHANES]    Frequency of Communication with Friends and Family: More than three times a week    Frequency of Social Gatherings with Friends and Family: Never    Attends Religious Services: 1 to 4 times per year    Active Member of [employer] or Organizations: No    Attends Engineer, structural: Never    Marital Status: Never married   Additional Social History:                         Sleep: Fair due to nightmares  Appetite:  Good  Current Medications: Current Facility-Administered  Medications  Medication Dose Route Frequency Provider Last Rate Last Admin   acetaminophen (TYLENOL) tablet 650 mg  650 mg Oral Q6H PRN Penn, Cranston Neighbor, NP   650 mg at 01/18/23 0839   alum & mag hydroxide-simeth (MAALOX/MYLANTA) 200-200-20 MG/5ML suspension 30 mL  30 mL Oral Q4H PRN Penn, Cicely, NP       bictegravir-emtricitabine-tenofovir AF (BIKTARVY) 50-200-25 MG per tablet 1 tablet  1 tablet Oral Daily Penn, Cicely, NP   1 tablet at 01/18/23 0836   hydrOXYzine (ATARAX) tablet 50 mg  50 mg Oral TID PRN Mcneil Sober, NP  50 mg at 01/17/23 2117   lisinopril (ZESTRIL) tablet 10 mg  10 mg Oral Daily Penn, Cicely, NP   10 mg at 01/18/23 1610   loperamide (IMODIUM) capsule 2 mg  2 mg Oral Q4H PRN Phineas Inches, MD   2 mg at 01/18/23 9604   lumateperone tosylate (CAPLYTA) capsule 42 mg  42 mg Oral QHS Massengill, Harrold Donath, MD   42 mg at 01/17/23 2117   magnesium hydroxide (MILK OF MAGNESIA) suspension 30 mL  30 mL Oral Daily PRN Penn, Cranston Neighbor, NP       nicotine (NICODERM CQ - dosed in mg/24 hours) patch 14 mg  14 mg Transdermal Q0600 Massengill, Harrold Donath, MD   14 mg at 01/18/23 0837   traZODone (DESYREL) tablet 50 mg  50 mg Oral QHS PRN Mcneil Sober, NP   50 mg at 01/17/23 2117    Lab Results:  Results for orders placed or performed during the hospital encounter of 01/14/23 (from the past 48 hour(s))  GC/Chlamydia probe amp ([place])not at [facility]     Status: None   Collection Time: 01/16/23  4:37 PM  Result Value Ref Range   Neisseria Gonorrhea Negative    Chlamydia Negative    Comment Normal Reference Ranger Chlamydia - Negative    Comment      Normal Reference Range Neisseria Gonorrhea - Negative  RPR     Status: None   Collection Time: 01/16/23  6:22 PM  Result Value Ref Range   RPR Ser Ql NON REACTIVE NON REACTIVE    Comment: Performed at [facility], [address] [place], [place] [address]    Blood Alcohol level:  Lab Results  Component Value Date   [facility] <10 01/14/2023    ETH <10 05/15/2022    Metabolic Disorder Labs: Lab Results  Component Value Date   HGBA1C 5.6 05/17/2022   MPG 114 05/17/2022   No results found for: "PROLACTIN" Lab Results  Component Value Date   CHOL 195 05/17/2022   TRIG 182 (H) 05/17/2022   HDL 38 (L) 05/17/2022   CHOLHDL 5.1 05/17/2022   VLDL 36 05/17/2022   LDLCALC 121 (H) 05/17/2022   LDLCALC 172 (H) 10/30/2020    Physical Findings: AIMS:  , ,  ,  ,    CIWA:    COWS:     Musculoskeletal: Strength & Muscle Tone: within normal limits Gait & Station:  laying in bed Patient leans: N/A  Psychiatric Specialty Exam:  Presentation  General Appearance:  Appropriate for Environment; Casual  Eye Contact: Good  Speech: Clear and Coherent; Normal Rate  Speech Volume: Normal  Handedness: Right   Mood and Affect  Mood: Anxious; Dysphoric  Affect: Congruent (withdrawn)   Thought Process  Thought Processes: Coherent; Linear  Descriptions of Associations:Intact  Orientation:Full (Time, Place and Person)  Thought Content:Logical; WDL  History of Schizophrenia/Schizoaffective disorder:No  Duration of Psychotic Symptoms:No data recorded Hallucinations:Hallucinations: None  Ideas of Reference:None  Suicidal Thoughts:Suicidal Thoughts: No  Homicidal Thoughts:Homicidal Thoughts: No   Sensorium  Memory: Immediate Good; Recent Good  Judgment: Fair  Insight: Fair   Art therapist  Concentration: Good  Attention Span: Good  Recall: Good  Fund of Knowledge: Good  Language: Good   Psychomotor Activity  Psychomotor Activity:Psychomotor Activity: Normal   Assets  Assets: Desire for Improvement; Communication Skills; Resilience   Sleep  Sleep:Sleep: Fair    Physical Exam: Physical Exam Vitals and nursing note reviewed.  Constitutional:      General:  She is not in acute distress.    Appearance: Normal appearance. She is normal weight. She is not ill-appearing or  toxic-appearing.  HENT:     Head: Normocephalic and atraumatic.  Pulmonary:     Effort: Pulmonary effort is normal.  Neurological:     General: No focal deficit present.     Mental Status: She is alert.    Review of Systems  Respiratory:  Negative for cough and shortness of breath.   Cardiovascular:  Negative for chest pain.  Gastrointestinal:  Negative for abdominal pain, constipation, diarrhea, nausea and vomiting.  Neurological:  Negative for dizziness, weakness and headaches.  Psychiatric/Behavioral:  Positive for depression. Negative for hallucinations and suicidal ideas. The patient is not nervous/anxious.    Blood pressure 120/80, pulse (!) 57, temperature 97.8 F (36.6 C), temperature source Oral, resp. rate 20, height 5\' 3"  (1.6 m), weight 78.9 kg, SpO2 97%. Body mass index is 30.82 kg/m.   Treatment Plan Summary: Daily contact with patient to assess and evaluate symptoms and progress in treatment and Medication management  Lori [name] is a 52 yr old female presented to  [facility] with complaints of suicidal ideation and feeling hopeless.   PPHx is significant for Bipolar Disorder, GAD, Depression, Substance Induced Mood Disorder, and Polysubstance Abuse (EtOH, Cocaine, Opiates, and THC), and 3 Suicide Attempts (last OD on Lisinopril-2020) and 2 Prior Psychiatric Hospitalizations.   Javaya started the Caplyta last night and appears to have tolerated it well, however, she did have significant nightmares last night.  She is improving as she no longer has SI.  We will not make any changes to her medications at this time.  We will continue to monitor.    Bipolar Disorder: -Continue Capylta 42 mg QHS for Bipolar Disorder   Nicotine Dependence: -Continue Nicotine Patch 14 mg daily   -Continue Lisinopril 10 mg daily -Continue Biktarvy 50-200-25 mg daily -Continue PRN's: Tylenol, Maalox, Atarax, Milk of Magnesia, Trazodone, Imodium   Lauro Franklin,  MD 01/18/2023, 2:56 PM

## 2023-01-18 NOTE — [facility] Group Notes (Signed)
[facility] Group Notes:  (Nursing/MHT/Case Management/Adjunct)  Date:  01/18/2023  Time:  2000  Type of Therapy:   wrap up group  Participation Level:  Active  Participation Quality:  Appropriate, Attentive, Sharing, and Supportive  Affect:  Depressed  Cognitive:  Alert  Insight:  Improving  Engagement in Group:  Engaged  Modes of Intervention:  Clarification, Education, and Support  Summary of Progress/Problems: Positive thinking and positive change were discussed.   Johann Capers S 01/18/2023, 9:08 PM

## 2023-01-18 NOTE — Progress Notes (Signed)
   01/17/23 2117  Psych Admission Type (Psych Patients Only)  Admission Status Voluntary/72 hour document signed  Psychosocial Assessment  Patient Complaints Depression;Anxiety  Eye Contact Fair  Facial Expression Animated  Affect Anxious  Speech Logical/coherent  Interaction Assertive  Motor Activity Slow  Appearance/Hygiene Unremarkable  Behavior Characteristics Cooperative;Appropriate to situation  Mood Anxious  Thought Process  Coherency WDL  Content WDL  Delusions None reported or observed  Perception WDL  Hallucination None reported or observed  Judgment Impaired  Confusion None  Danger to Self  Current suicidal ideation? Denies  Self-Injurious Behavior No self-injurious ideation or behavior indicators observed or expressed   Agreement Not to Harm Self Yes  Description of Agreement verbal  Danger to Others  Danger to Others None reported or observed

## 2023-01-18 NOTE — Progress Notes (Signed)
   01/18/23 2111  Psych Admission Type (Psych Patients Only)  Admission Status Voluntary  Psychosocial Assessment  Patient Complaints Anxiety;Depression  Eye Contact Fair  Facial Expression Animated  Affect Anxious  Speech Logical/coherent  Interaction Assertive  Motor Activity Slow  Appearance/Hygiene Unremarkable  Behavior Characteristics Cooperative;Appropriate to situation  Mood Anxious;Depressed  Thought Process  Coherency WDL  Content WDL  Delusions None reported or observed  Perception WDL  Hallucination None reported or observed  Judgment Impaired  Confusion None  Danger to Self  Current suicidal ideation? Denies  Self-Injurious Behavior No self-injurious ideation or behavior indicators observed or expressed   Agreement Not to Harm Self Yes  Description of Agreement verbal  Danger to Others  Danger to Others None reported or observed

## 2023-01-18 NOTE — Plan of Care (Signed)
  Problem: Education: Goal: Emotional status will improve Outcome: Progressing Goal: Mental status will improve Outcome: Progressing   

## 2023-01-19 ENCOUNTER — Other Ambulatory Visit (HOSPITAL_COMMUNITY): Payer: Self-pay

## 2023-01-19 NOTE — Group Note (Signed)
Recreation Therapy Group Note   Group Topic:Team Building  Group Date: 01/19/2023 Start Time: 0930 End Time: 1000 Facilitators: Nejla Reasor-McCall, LRT,CTRS Location: 300 [address] Dayroom   Goal Area(s) Addresses:  Patient will effectively work with peer towards shared goal.  Patient will identify skills used to make activity successful.  Patient will identify how skills used during activity can be applied to reach post d/c goals.   Group Description: [employer]. In teams of 5-6, patients were given 11 craft pipe cleaners. Using the materials provided, patients were instructed to compete again the opposing team(s) to build the tallest free-standing structure from floor level. The activity was timed; difficulty increased by Clinical research associate as Production designer, theatre/television/film continued.  Systematically resources were removed with additional directions for example, placing one arm behind their back, working in silence, and shape stipulations. LRT facilitated post-activity discussion reviewing team processes and necessary communication skills involved in completion. Patients were encouraged to reflect how the skills utilized, or not utilized, in this activity can be incorporated to positively impact support systems post discharge.   Clinical Observations/Individualized Feedback:  Due to limited staffing, group did not occur because LRT was helping on 500 hall by opening the dayroom for the patients.   Plan: Continue to engage patient in RT group sessions 2-3x/week.   Ravonda Brecheen-McCall, LRT,CTRS  01/19/2023 12:59 PM

## 2023-01-19 NOTE — Progress Notes (Cosign Needed Addendum)
[facility] MD Progress Note  01/19/2023 9:58 AM Lori [name]  MRN:  [id number] Subjective:   Lori [name] is a 52 yr old female presented to  [facility] with complaints of suicidal ideation and feeling hopeless.   PPHx is significant for Bipolar Disorder, GAD, Depression, Substance Induced Mood Disorder, and Polysubstance Abuse (EtOH, Cocaine, Opiates, and THC), and 3 Suicide Attempts (last OD on Lisinopril-2020) and 2 Prior Psychiatric Hospitalizations.   Case was discussed in the multidisciplinary team. MAR was reviewed and patient was compliant with medications.  She received Trazodone and Hydroxyzine last night.   Psychiatric Team made the following recommendations yesterday: -Continue Capylta 42 mg QHS for Bipolar Disorder   On interview today patient reports she slept poor last night due to diarrhea.  She reports her appetite is doing good.  She reports no SI, HI, or AVH.  She reports no Paranoia or Ideas of Reference.  She reports no issues with her medications.  She reports that she feels like the Caplyta has been working as she was able to get out of her room and interact with others in the day room for about 3 hours last night.  She reports that last night she did not have nightmares but did have vivid dreams but that it was improved from that before.  Discussed with her that we could provide her with information for PCP to further address her concerns about IBS.  She reports no other concerns at present.    Principal Problem: Bipolar disorder, unspecified (HCC) Diagnosis: Principal Problem:   Bipolar disorder, unspecified (HCC) Active Problems:   Cocaine abuse (HCC)   Cannabis use disorder   Polysubstance abuse (HCC)   Generalized anxiety disorder   Suicidal ideation  Total Time spent with patient:  I personally spent 35 minutes on the unit in direct patient care. The direct patient care time included face-to-face time with the patient, reviewing the patient's chart,  communicating with other professionals, and coordinating care. Greater than 50% of this time was spent in counseling or coordinating care with the patient regarding goals of hospitalization, psycho-education, and discharge planning needs.   Past Psychiatric History: Bipolar Disorder, GAD, Depression, Substance Induced Mood Disorder, and Polysubstance Abuse (EtOH, Cocaine, Opiates, and THC), and 3 Suicide Attempts (last OD on Lisinopril-2020) and 2 Prior Psychiatric Hospitalizations.  Past Medical History:  Past Medical History:  Diagnosis Date   Abnormal Pap smear    Had Cryo to treat   Allergy    Anemia    Anxiety    Cluster headaches    HIV infection (HCC)    Hypertension    Lung nodule    Vaginal venereal warts     Past Surgical History:  Procedure Laterality Date   ABDOMINAL HYSTERECTOMY     KNEE ARTHROSCOPY Left    bakers cyst removal   TUBAL LIGATION     Family History:  Family History  Problem Relation Age of Onset   CAD Mother        MI 2016   Uterine cancer Mother    Cervical cancer Mother    Colon cancer Mother    Lung cancer Maternal Grandmother    Cancer Maternal Grandfather        larynx   Prostate cancer Maternal Grandfather    Prostate cancer Father    Breast cancer Neg Hx    Liver disease Neg Hx    Esophageal cancer Neg Hx    Stomach cancer Neg Hx    Colon  polyps Neg Hx    Family Psychiatric  History: None Reported Social History:  Social History   Substance and Sexual Activity  Alcohol Use Yes     Social History   Substance and Sexual Activity  Drug Use Yes   Frequency: 7.0 times per week   Types: Marijuana, Cocaine   Comment: marijuana daily, cocaine as needed    Social History   Socioeconomic History   Marital status: Single    Spouse name: Not on file   Number of children: Not on file   Years of education: Not on file   Highest education level: Not on file  Occupational History   Not on file  Tobacco Use   Smoking status:  Every Day    Current packs/day: 1.00    Average packs/day: 1 pack/day for 27.0 years (27.0 ttl pk-yrs)    Types: Cigarettes   Smokeless tobacco: Never   Tobacco comments:    patient not ready to quit; considering patches  Vaping Use   Vaping status: Former   Substances: Nicotine, THC  Substance and Sexual Activity   Alcohol use: Yes   Drug use: Yes    Frequency: 7.0 times per week    Types: Marijuana, Cocaine    Comment: marijuana daily, cocaine as needed   Sexual activity: Yes    Birth control/protection: Surgical, Condom    Comment: pt. declined condoms  Other Topics Concern   Not on file  Social History Narrative   Not on file   Social Determinants of Health   Financial Resource Strain: Low Risk  (02/04/2020)   Overall Financial Resource Strain (CARDIA)    Difficulty of Paying Living Expenses: Not very hard  Food Insecurity: No Food Insecurity (11/02/2022)   Hunger Vital Sign    Worried About Running Out of Food in the Last Year: Never true    Ran Out of Food in the Last Year: Never true  Transportation Needs: Unmet Transportation Needs (11/02/2022)   PRAPARE - Transportation    Lack of Transportation (Medical): Yes    Lack of Transportation (Non-Medical): Yes  Physical Activity: Inactive (02/04/2020)   Exercise Vital Sign    Days of Exercise per Week: 0 days    Minutes of Exercise per Session: 0 min  Stress: Stress Concern Present (02/04/2020)   [employer] of Occupational Health - Occupational Stress Questionnaire    Feeling of Stress : Very much  Social Connections: Moderately Isolated (02/04/2020)   Social Connection and Isolation Panel [NHANES]    Frequency of Communication with Friends and Family: More than three times a week    Frequency of Social Gatherings with Friends and Family: Never    Attends Religious Services: 1 to 4 times per year    Active Member of [employer] or Organizations: No    Attends Banker Meetings: Never    Marital Status:  Never married   Additional Social History:                         Sleep: Poor due to diarrhea   Appetite:  Good  Current Medications: Current Facility-Administered Medications  Medication Dose Route Frequency Provider Last Rate Last Admin   acetaminophen (TYLENOL) tablet 650 mg  650 mg Oral Q6H PRN Penn, Cicely, NP   650 mg at 01/18/23 1652   alum & mag hydroxide-simeth (MAALOX/MYLANTA) 200-200-20 MG/5ML suspension 30 mL  30 mL Oral Q4H PRN Mcneil Sober, NP  bictegravir-emtricitabine-tenofovir AF (BIKTARVY) 50-200-25 MG per tablet 1 tablet  1 tablet Oral Daily Penn, Cicely, NP   1 tablet at 01/19/23 8295   hydrOXYzine (ATARAX) tablet 50 mg  50 mg Oral TID PRN Mcneil Sober, NP   50 mg at 01/18/23 2111   lisinopril (ZESTRIL) tablet 10 mg  10 mg Oral Daily Penn, Cranston Neighbor, NP   10 mg at 01/19/23 6213   loperamide (IMODIUM) capsule 2 mg  2 mg Oral Q4H PRN Massengill, Harrold Donath, MD   2 mg at 01/19/23 0865   lumateperone tosylate (CAPLYTA) capsule 42 mg  42 mg Oral QHS Massengill, Harrold Donath, MD   42 mg at 01/18/23 2111   magnesium hydroxide (MILK OF MAGNESIA) suspension 30 mL  30 mL Oral Daily PRN Penn, Cranston Neighbor, NP       nicotine (NICODERM CQ - dosed in mg/24 hours) patch 14 mg  14 mg Transdermal Q0600 Massengill, Harrold Donath, MD   14 mg at 01/19/23 0828   traZODone (DESYREL) tablet 50 mg  50 mg Oral QHS PRN Mcneil Sober, NP   50 mg at 01/18/23 2111    Lab Results:  No results found for this or any previous visit (from the past 48 hour(s)).   Blood Alcohol level:  Lab Results  Component Value Date   ETH <10 01/14/2023   ETH <10 05/15/2022    Metabolic Disorder Labs: Lab Results  Component Value Date   HGBA1C 5.6 05/17/2022   MPG 114 05/17/2022   No results found for: "PROLACTIN" Lab Results  Component Value Date   CHOL 195 05/17/2022   TRIG 182 (H) 05/17/2022   HDL 38 (L) 05/17/2022   CHOLHDL 5.1 05/17/2022   VLDL 36 05/17/2022   LDLCALC 121 (H) 05/17/2022   LDLCALC 172  (H) 10/30/2020    Physical Findings: AIMS:  , ,  ,  ,    CIWA:    COWS:     Musculoskeletal: Strength & Muscle Tone: within normal limits Gait & Station:  laying in bed Patient leans: N/A  Psychiatric Specialty Exam:  Presentation  General Appearance:  Appropriate for Environment; Casual  Eye Contact: Good  Speech: Clear and Coherent; Normal Rate  Speech Volume: Normal  Handedness: Right   Mood and Affect  Mood: Dysphoric  Affect: Congruent   Thought Process  Thought Processes: Coherent; Goal Directed  Descriptions of Associations:Intact  Orientation:Full (Time, Place and Person)  Thought Content:Logical; WDL  History of Schizophrenia/Schizoaffective disorder:No  Duration of Psychotic Symptoms:No data recorded Hallucinations:Hallucinations: None  Ideas of Reference:None  Suicidal Thoughts:Suicidal Thoughts: No  Homicidal Thoughts:Homicidal Thoughts: No   Sensorium  Memory: Immediate Good; Recent Good  Judgment: Fair  Insight: Fair   Art therapist  Concentration: Good  Attention Span: Good  Recall: Good  Fund of Knowledge: Good  Language: Good   Psychomotor Activity  Psychomotor Activity:Psychomotor Activity: Normal   Assets  Assets: Communication Skills; Desire for Improvement; Resilience   Sleep  Sleep:Sleep: Poor (due to stomach issues)    Physical Exam: Physical Exam Vitals and nursing note reviewed.  Constitutional:      General: She is not in acute distress.    Appearance: Normal appearance. She is normal weight. She is ill-appearing. She is not toxic-appearing.  HENT:     Head: Normocephalic and atraumatic.  Pulmonary:     Effort: Pulmonary effort is normal.  Neurological:     General: No focal deficit present.     Mental Status: She is alert.    Review of  Systems  Respiratory:  Negative for cough and shortness of breath.   Cardiovascular:  Negative for chest pain.  Gastrointestinal:   Positive for diarrhea. Negative for abdominal pain, constipation, nausea and vomiting.  Neurological:  Negative for dizziness, weakness and headaches.  Psychiatric/Behavioral:  Negative for depression, hallucinations and suicidal ideas. The patient is not nervous/anxious.    Blood pressure (!) 136/91, pulse (!) 43, temperature 97.6 F (36.4 C), temperature source Oral, resp. rate 20, height 5\' 3"  (1.6 m), weight 78.9 kg, SpO2 98%. Body mass index is 30.82 kg/m.   Treatment Plan Summary: Daily contact with patient to assess and evaluate symptoms and progress in treatment and Medication management  Lori [name] is a 52 yr old female presented to  [facility] with complaints of suicidal ideation and feeling hopeless.   PPHx is significant for Bipolar Disorder, GAD, Depression, Substance Induced Mood Disorder, and Polysubstance Abuse (EtOH, Cocaine, Opiates, and THC), and 3 Suicide Attempts (last OD on Lisinopril-2020) and 2 Prior Psychiatric Hospitalizations.   Lori [name] has responded well to the Caplyta as she is no longer having SI and was able to interact with others on the unit for a few hours last night.  She is having ongoing stomach issues which she believes is IBS.  We have asked social work to help provide PCP resources so she can follow up with this.  We will plan for discharge tomorrow.  We will not make any changes to her medications at this time.  We will continue to monitor.   Bipolar Disorder: -Continue Capylta 42 mg QHS for Bipolar Disorder   Nicotine Dependence: -Continue Nicotine Patch 14 mg daily   -Continue Lisinopril 10 mg daily -Continue Biktarvy 50-200-25 mg daily -Continue PRN's: Tylenol, Maalox, Atarax, Milk of Magnesia, Trazodone, Imodium   Lauro Franklin, MD 01/19/2023, 9:58 AM

## 2023-01-19 NOTE — Progress Notes (Signed)
CSW provided [place] homeless resources at patients request.

## 2023-01-19 NOTE — Progress Notes (Signed)
D: Patient is alert, oriented, pleasant, and cooperative. Denies SI, HI, AVH, and verbally contracts for safety. Patient reports she slept poorly last night with. Patient reports her appetite as good, energy level as low, and concentration as poor. Patient rates her depression 5/10, hopelessness 3/10, and anxiety 7/10.    A: Scheduled medications administered per MD order. PRN tylenol administered. Support provided. Patient educated on safety on the unit and medications. Routine safety checks every 15 minutes. Patient stated understanding to tell nurse about any new physical symptoms. Patient understands to tell staff of any needs.     R: No adverse drug reactions noted. Patient verbally contracts for safety. Patient remains safe at this time and will continue to monitor.    01/19/23 1000  Psych Admission Type (Psych Patients Only)  Admission Status Voluntary  Psychosocial Assessment  Patient Complaints Anxiety;Depression  Eye Contact Fair  Facial Expression Animated  Affect Anxious  Speech Logical/coherent  Interaction Assertive  Motor Activity Slow  Appearance/Hygiene Unremarkable;In hospital gown  Behavior Characteristics Cooperative;Appropriate to situation  Mood Depressed;Anxious;Pleasant  Thought Process  Coherency WDL  Content WDL  Delusions None reported or observed  Perception WDL  Hallucination None reported or observed  Judgment Impaired  Confusion None  Danger to Self  Current suicidal ideation? Denies  Self-Injurious Behavior No self-injurious ideation or behavior indicators observed or expressed   Agreement Not to Harm Self Yes  Description of Agreement verbal  Danger to Others  Danger to Others None reported or observed

## 2023-01-19 NOTE — Progress Notes (Signed)
   01/19/23 0545  15 Minute Checks  Location Bedroom  Visual Appearance Calm  Behavior Composed  Sleep (Behavioral Health Patients Only)  Calculate sleep? (Click Yes once per 24 hr at 0600 safety check) Yes  Documented sleep last 24 hours 6

## 2023-01-19 NOTE — Group Note (Unsigned)
Date:  01/19/2023 Time:  9:09 AM  Group Topic/Focus:  Goals Group:   The focus of this group is to help patients establish daily goals to achieve during treatment and discuss how the patient can incorporate goal setting into their daily lives to aide in recovery.     Participation Level:  {BHH PARTICIPATION UJWJX:91478}  Participation Quality:  {BHH PARTICIPATION QUALITY:22265}  Affect:  {BHH AFFECT:22266}  Cognitive:  {BHH COGNITIVE:22267}  Insight: {BHH Insight2:20797}  Engagement in Group:  {BHH ENGAGEMENT IN GNFAO:13086}  Modes of Intervention:  {BHH MODES OF INTERVENTION:22269}  Additional Comments:  ***  Lori [name] 01/19/2023, 9:09 AM

## 2023-01-19 NOTE — Progress Notes (Signed)
   01/19/23 2239  Psych Admission Type (Psych Patients Only)  Admission Status Voluntary  Psychosocial Assessment  Patient Complaints Anxiety;Depression  Eye Contact Fair  Facial Expression Animated  Affect Anxious  Speech Logical/coherent  Interaction Assertive  Motor Activity Slow  Appearance/Hygiene Unremarkable  Behavior Characteristics Cooperative;Appropriate to situation;Anxious  Mood Depressed;Anxious;Pleasant  Thought Process  Coherency WDL  Content WDL  Delusions None reported or observed  Hallucination None reported or observed  Judgment WDL  Confusion None  Danger to Self  Current suicidal ideation? Denies  Agreement Not to Harm Self Yes  Description of Agreement verbal  Danger to Others  Danger to Others None reported or observed   Progress note   D: Pt seen in her room. Pt denies SI, HI, AVH. Pt rates pain  5/10 as discomfort in her "gut" from diarrhea. Pt reports diarrhea started when she was admitted and has been going on intermittently. "We don't have much food so I was only eating about one meal a day. When I came here there was so much food. I need to get back on a regular eating plan." Pt last bout of diarrhea was this morning and she was given PRN medication. Pt rates anxiety  6/10 and depression  2/10. Pt anxious about discharge tomorrow. "They gave me a list of shelters. I may need to go to the [employer]. I've never stayed in a shelter. I'm hoping that the [facility] is able to help with temporary housing. My check for this month already went to bills but I'll get paid again on the second of September. Whatever I do, I'm not going back to [facility]." Pt states she lived with her brother there and does not want to return. Pt is currently homeless. Is to be discharged tomorrow. Pt encouraged to ask SW for possible transportation to a shelter in [place] as inclement weather and distance are an issue. No other concerns noted at this  time.  A: Pt provided support and encouragement. Pt given scheduled medication as prescribed. PRNs as appropriate. Q15 min checks for safety.   R: Pt safe on the unit. Will continue to monitor.

## 2023-01-19 NOTE — BHH Group Notes (Signed)
Adult Psychoeducational Group Note  Date:  01/19/2023 Time:  9:34 PM  Group Topic/Focus:  Wrap-Up Group:   The focus of this group is to help patients review their daily goal of treatment and discuss progress on daily workbooks.  Participation Level:  Active  Participation Quality:  Appropriate  Affect:  Appropriate  Cognitive:  Appropriate  Insight: Appropriate  Engagement in Group:  Engaged  Modes of Intervention:  Discussion and Support  Additional Comments:  Pt attended the evening NA meeting.  Christ Kick 01/19/2023, 9:34 PM

## 2023-01-19 NOTE — Progress Notes (Signed)
Patient up with complaints of diarrhea throughout the night.  Administered PRN Imodium per [facility] per patient request.

## 2023-01-20 ENCOUNTER — Other Ambulatory Visit (HOSPITAL_COMMUNITY): Payer: Self-pay

## 2023-01-20 DIAGNOSIS — F332 Major depressive disorder, recurrent severe without psychotic features: Secondary | ICD-10-CM

## 2023-01-20 MED ORDER — TRAZODONE HCL 50 MG PO TABS
50.0000 mg | ORAL_TABLET | Freq: Every evening | ORAL | 0 refills | Status: DC | PRN
  Filled 2023-01-20: qty 30, 30d supply, fill #0

## 2023-01-20 MED ORDER — LUMATEPERONE TOSYLATE 42 MG PO CAPS
42.0000 mg | ORAL_CAPSULE | Freq: Every day | ORAL | 0 refills | Status: DC
  Filled 2023-01-20: qty 30, 30d supply, fill #0

## 2023-01-20 MED ORDER — HYDROXYZINE HCL 50 MG PO TABS
50.0000 mg | ORAL_TABLET | Freq: Three times a day (TID) | ORAL | 0 refills | Status: DC | PRN
  Filled 2023-01-20: qty 45, 15d supply, fill #0

## 2023-01-20 MED ORDER — BIKTARVY 50-200-25 MG PO TABS
1.0000 | ORAL_TABLET | Freq: Every day | ORAL | 0 refills | Status: DC
  Filled 2023-01-20 (×2): qty 30, 30d supply, fill #0

## 2023-01-20 MED ORDER — LISINOPRIL 10 MG PO TABS
10.0000 mg | ORAL_TABLET | Freq: Every day | ORAL | 0 refills | Status: DC
  Filled 2023-01-20: qty 30, 30d supply, fill #0

## 2023-01-20 MED ORDER — NICOTINE 14 MG/24HR TD PT24: 14.0000 mg | MEDICATED_PATCH | Freq: Every day | TRANSDERMAL | 0 refills | Status: DC

## 2023-01-20 NOTE — Discharge Instructions (Signed)

## 2023-01-20 NOTE — Plan of Care (Signed)

## 2023-01-20 NOTE — Progress Notes (Signed)
   01/20/23 0557  15 Minute Checks  Location Bedroom  Visual Appearance Calm  Behavior Sleeping  Sleep (Behavioral Health Patients Only)  Calculate sleep? (Click Yes once per 24 hr at 0600 safety check) Yes  Documented sleep last 24 hours 7

## 2023-01-20 NOTE — [facility] Suicide Risk Assessment (Signed)
Suicide Risk Assessment  Discharge Assessment    [facility] Discharge Suicide Risk Assessment   Principal Problem: Bipolar disorder, unspecified (HCC) Discharge Diagnoses: Principal Problem:   Bipolar disorder, unspecified (HCC) Active Problems:   Cocaine abuse (HCC)   Cannabis use disorder   Polysubstance abuse (HCC)   Generalized anxiety disorder   Suicidal ideation  During the patient's hospitalization, patient had extensive initial psychiatric evaluation, and follow-up psychiatric evaluations every day.  Psychiatric diagnoses provided upon initial assessment: Bipolar Disorder, GAD, Polysubstance Abuse (Cocaine, THC), and PTSD  Patient's psychiatric medications were adjusted on admission: Restarted Latuda.  During the hospitalization, other adjustments were made to the patient's psychiatric medication regimen: Due to past side effects and cost Latuda was stopped and she was started on Caplyta.  Gradually, patient started adjusting to milieu.   Patient's care was discussed during the interdisciplinary team meeting every day during the hospitalization.  The patient is not having side effects to prescribed psychiatric medication.  The patient reports their target psychiatric symptoms of depression and SI responded well to the psychiatric medications, and the patient reports overall benefit other psychiatric hospitalization. Supportive psychotherapy was provided to the patient. The patient also participated in regular group therapy while admitted.   Labs were reviewed with the patient, and abnormal results were discussed with the patient.  The patient denied having suicidal thoughts more than 48 hours prior to discharge.  Patient denies having homicidal thoughts.  Patient denies having auditory hallucinations.  Patient denies any visual hallucinations.  Patient denies having paranoid thoughts.  The patient is able to verbalize their individual safety plan to this provider.  It is  recommended to the patient to continue psychiatric medications as prescribed, after discharge from the hospital.    It is recommended to the patient to follow up with your outpatient psychiatric provider and PCP.  Discussed with the patient, the impact of alcohol, drugs, tobacco have been there overall psychiatric and medical wellbeing, and total abstinence from substance use was recommended the patient.  Total Time spent with patient: 20 minutes  Musculoskeletal: Strength & Muscle Tone: within normal limits Gait & Station: normal Patient leans: N/A  Psychiatric Specialty Exam  Presentation  General Appearance:  Appropriate for Environment; Casual  Eye Contact: Good  Speech: Clear and Coherent; Normal Rate  Speech Volume: Normal  Handedness: Right   Mood and Affect  Mood: -- ("ok")  Duration of Depression Symptoms: Greater than two weeks  Affect: Appropriate; Congruent   Thought Process  Thought Processes: Coherent; Goal Directed  Descriptions of Associations:Intact  Orientation:Full (Time, Place and Person)  Thought Content:Logical; WDL  History of Schizophrenia/Schizoaffective disorder:No  Duration of Psychotic Symptoms:No data recorded Hallucinations:Hallucinations: None  Ideas of Reference:None  Suicidal Thoughts:Suicidal Thoughts: No  Homicidal Thoughts:Homicidal Thoughts: No   Sensorium  Memory: Immediate Good; Recent Good  Judgment: Good  Insight: Good   Executive Functions  Concentration: Good  Attention Span: Good  Recall: Good  Fund of Knowledge: Good  Language: Good   Psychomotor Activity  Psychomotor Activity: Psychomotor Activity: Normal   Assets  Assets: Communication Skills; Desire for Improvement; Resilience   Sleep  Sleep: Sleep: Fair Number of Hours of Sleep: 7   Physical Exam: Physical Exam Vitals and nursing note reviewed.  Constitutional:      General: She is not in acute distress.     Appearance: Normal appearance. She is normal weight. She is not ill-appearing or toxic-appearing.  HENT:     Head: Normocephalic and atraumatic.  Pulmonary:  Effort: Pulmonary effort is normal.  Musculoskeletal:        General: Normal range of motion.  Neurological:     General: No focal deficit present.     Mental Status: She is alert.    Review of Systems  Respiratory:  Negative for cough and shortness of breath.   Cardiovascular:  Negative for chest pain.  Gastrointestinal:  Negative for abdominal pain, constipation, diarrhea, nausea and vomiting.  Neurological:  Negative for dizziness, weakness and headaches.  Psychiatric/Behavioral:  Negative for depression, hallucinations and suicidal ideas. The patient is not nervous/anxious.    Blood pressure 120/78, pulse (!) 57, temperature 97.8 F (36.6 C), temperature source Oral, resp. rate 16, height 5\' 3"  (1.6 m), weight 78.9 kg, SpO2 98%. Body mass index is 30.82 kg/m.  Mental Status Per Nursing Assessment::   On Admission:     Demographic Factors:  Caucasian and Living alone  Loss Factors: Loss of significant relationship and Financial problems/change in socioeconomic status  Historical Factors: Prior suicide attempts, Family history of mental illness or substance abuse, and Impulsivity  Risk Reduction Factors:   Positive coping skills or problem solving skills  Continued Clinical Symptoms:  Alcohol/Substance Abuse/Dependencies More than one psychiatric diagnosis Previous Psychiatric Diagnoses and Treatments Medical Diagnoses and Treatments/Surgeries  Cognitive Features That Contribute To Risk:  None    Suicide Risk:  Mild:  No Suicidal ideation.  There are no identifiable plans, no associated intent, mild dysphoria and related symptoms, good self-control (both objective and subjective assessment), few other risk factors, and identifiable protective factors, including available and accessible social support.   However, given history of previous Suicide Attempts there is some risk present.   Follow-up Information     [facility]. Go to.   Specialty: Behavioral Health Why: For residents of [facility] or homeless in [place], please go to this provider for an assessment, to obtain therapy and/or medication management services, during walk in hours: Monday through Friday, arrive at 7:00 am. Contact information: [address] [place] [address] [phone]        [facility]. Go to.   Why: You have a hospital follow up appointment to establish care with this provider on 01/31/23 at 1:45 pm with Dr. Annie Paras.  The appointment will be held in person. Contact information: [address] [place] [address] [phone]                Plan Of Care/Follow-up recommendations:  Activity: as tolerated  Diet: heart healthy  Other: -Follow-up with your outpatient psychiatric provider -instructions on appointment date, time, and address (location) are provided to you in discharge paperwork.  -Take your psychiatric medications as prescribed at discharge - instructions are provided to you in the discharge paperwork  -Follow-up with outpatient primary care doctor and other specialists -for management of chronic medical disease, including: Establish with PCP to further work up concerns for IBS.  Continue follow up with ID clinic.   -Testing: Follow-up with outpatient provider for abnormal lab results: None  -Recommend abstinence from alcohol, tobacco, and other illicit drug use at discharge.   -If your psychiatric symptoms recur, worsen, or if you have side effects to your psychiatric medications, call your outpatient psychiatric provider, 911, [phone] or go to the nearest emergency department.  -If suicidal thoughts recur, call your outpatient psychiatric provider, 911, [phone] or go to the nearest emergency  department.   Lauro Franklin, MD 01/20/2023, 9:06  AM

## 2023-01-20 NOTE — [facility] Suicide Risk Assessment (Signed)
[facility] Suicide Risk Assessment   This pt was seen and evaluated by Assunta Found, who did not complete this SRA on day of admission. I did discuss the case with the NP, and I have created the SRA, based on information from my discussion with the NP.    Nursing information obtained from:    Demographic factors:    Current Mental Status:    Loss Factors:    Historical Factors:    Risk Reduction Factors:      Total Time spent with patient: 30 minutes Principal Problem: Bipolar disorder, unspecified (HCC) Diagnosis:  Principal Problem:   Bipolar disorder, unspecified (HCC) Active Problems:   Cocaine abuse (HCC)   Cannabis use disorder   Polysubstance abuse (HCC)   Generalized anxiety disorder   Suicidal ideation  Subjective Data: See H&P    Continued Clinical Symptoms:    The "Alcohol Use Disorders Identification Test", Guidelines for Use in Primary Care, Second Edition.  World Science writer [facility]). Score between 0-7:  no or low risk or alcohol related problems. Score between 8-15:  moderate risk of alcohol related problems. Score between 16-19:  high risk of alcohol related problems. Score 20 or above:  warrants further diagnostic evaluation for alcohol dependence and treatment.   CLINICAL FACTORS:   Severe Anxiety and/or Agitation Depression:   Anhedonia Hopelessness Impulsivity Alcohol/Substance Abuse/Dependencies More than one psychiatric diagnosis Unstable or Poor Therapeutic Relationship Previous Psychiatric Diagnoses and Treatments   Psychiatric Specialty Exam:  Presentation  General Appearance:  Appropriate for Environment; Casual  Eye Contact: Good  Speech: Clear and Coherent; Normal Rate  Speech Volume: Normal  Handedness: Right   Mood and Affect  Mood: -- ("ok")  Affect: Appropriate; Congruent   Thought Process  Thought Processes: Coherent; Goal Directed  Descriptions of Associations:Intact  Orientation:Full (Time,  Place and Person)  Thought Content:Logical; WDL  History of Schizophrenia/Schizoaffective disorder:No  Duration of Psychotic Symptoms:No data recorded Hallucinations:Hallucinations: None  Ideas of Reference:None  Suicidal Thoughts:Suicidal Thoughts: No  Homicidal Thoughts:Homicidal Thoughts: No   Sensorium  Memory: Immediate Good; Recent Good  Judgment: Good  Insight: Good   Executive Functions  Concentration: Good  Attention Span: Good  Recall: Good  Fund of Knowledge: Good  Language: Good   Psychomotor Activity  Psychomotor Activity: Psychomotor Activity: Normal   Assets  Assets: Communication Skills; Desire for Improvement; Resilience   Sleep  Sleep: Sleep: Fair Number of Hours of Sleep: 7    Physical Exam: Physical Exam See H&P  ROS See H&P  Blood pressure 120/78, pulse (!) 57, temperature 97.8 F (36.6 C), temperature source Oral, resp. rate 16, height 5\' 3"  (1.6 m), weight 78.9 kg, SpO2 98%. Body mass index is 30.82 kg/m.   COGNITIVE FEATURES THAT CONTRIBUTE TO RISK:  None    SUICIDE RISK:   Moderate:  Frequent suicidal ideation with limited intensity, and duration, some specificity in terms of plans, no associated intent, good self-control, limited dysphoria/symptomatology, some risk factors present, and identifiable protective factors, including available and accessible social support.  PLAN OF CARE: See H&P   I certify that inpatient services furnished can reasonably be expected to improve the patient's condition.   [name] Hilts, MD 01/20/2023, 9:01 AM

## 2023-01-20 NOTE — Discharge Summary (Signed)
Physician Discharge Summary Note  Patient:  Lori [name] is an 52 y.o., female MRN:  [id number] DOB:  [birth year] Patient phone:  [phone] (home)  Patient address:   [address] [place] [address],  Total Time spent with patient: 20 minutes  Date of Admission:  01/14/2023 Date of Discharge: 01/20/2023  Reason for Admission:   Lori [name] is a 52 yr old female presented to [facility] with complaints of suicidal ideation and feeling hopeless. PPHx is significant for Bipolar Disorder, GAD, Depression, Substance Induced Mood Disorder, and Polysubstance Abuse (EtOH, Cocaine, Opiates, and THC), and 3 Suicide Attempts (last OD on Lisinopril-2020) and 2 Prior Psychiatric Hospitalizations.    "On evaluation, Lori [name] is found standing in hallway speaking to nurse.  She agrees to go to her room for assessment.  Patient is alert, oriented x 4, and cooperative.  There is no noted distress.  Her speech is clear, coherent, at normal rate and volume.  She maintained good eye contact.  Her mood is depressed with congruent affect.  Her thought process is logical bu liner.  She currently denies suicidal/self-harm/homicidal ideation, psychosis, and paranoia.  Reporting her last suicidal thoughts were yesterday.  Stating she was here form [facility] emergency room where she initially presented because she was having suicidal thoughts with a plant to walk into traffic.  Reports prior suicide attempt via overdoes.  She states that she is not happy with her life but gives no further information.  She reports polysubstance use ("crack and weed").  Urine drug screen positive for cocaine and cannabis.  States she had been to rehab in past "but it didn't work."  She states she has no support and no outpatient psychiatric services.  She states that she has tolerated medications.  But was noncompliant with medications prior to admission.  Was prescribed Latuda during her last psychiatric  hospitalization but hasn't been taking.  Patient states she has not attended any group sessions related to "I just got her last night and I'm still getting acclimated and catching up on my sleep.  I plan to attend group later today though."         Objectively there is no evidence of psychosis/mania or delusional thinking.  Her responses to assessment questions were relevant and appropriate.  She conversed coherently, with goal directed thoughts, no distractibility, or pre-occupation."  Principal Problem: Bipolar disorder, unspecified (HCC) Discharge Diagnoses: Principal Problem:   Bipolar disorder, unspecified (HCC) Active Problems:   Cocaine abuse (HCC)   Cannabis use disorder   Polysubstance abuse (HCC)   Generalized anxiety disorder   Suicidal ideation   Past Psychiatric History: Bipolar Disorder, GAD, Depression, Substance Induced Mood Disorder, and Polysubstance Abuse (EtOH, Cocaine, Opiates, and THC), and 3 Suicide Attempts (last OD on Lisinopril-2020) and 2 Prior Psychiatric Hospitalizations.   Past Medical History:  Past Medical History:  Diagnosis Date   Abnormal Pap smear    Had Cryo to treat   Allergy    Anemia    Anxiety    Cluster headaches    HIV infection (HCC)    Hypertension    Lung nodule    Vaginal venereal warts     Past Surgical History:  Procedure Laterality Date   ABDOMINAL HYSTERECTOMY     KNEE ARTHROSCOPY Left    bakers cyst removal   TUBAL LIGATION     Family History:  Family History  Problem Relation Age of Onset   CAD Mother  MI 2016   Uterine cancer Mother    Cervical cancer Mother    Colon cancer Mother    Lung cancer Maternal Grandmother    Cancer Maternal Grandfather        larynx   Prostate cancer Maternal Grandfather    Prostate cancer Father    Breast cancer Neg Hx    Liver disease Neg Hx    Esophageal cancer Neg Hx    Stomach cancer Neg Hx    Colon polyps Neg Hx    Family Psychiatric  History: None Reported  Social  History:  Social History   Substance and Sexual Activity  Alcohol Use Yes     Social History   Substance and Sexual Activity  Drug Use Yes   Frequency: 7.0 times per week   Types: Marijuana, Cocaine   Comment: marijuana daily, cocaine as needed    Social History   Socioeconomic History   Marital status: Single    Spouse name: Not on file   Number of children: Not on file   Years of education: Not on file   Highest education level: Not on file  Occupational History   Not on file  Tobacco Use   Smoking status: Every Day    Current packs/day: 1.00    Average packs/day: 1 pack/day for 27.0 years (27.0 ttl pk-yrs)    Types: Cigarettes   Smokeless tobacco: Never   Tobacco comments:    patient not ready to quit; considering patches  Vaping Use   Vaping status: Former   Substances: Nicotine, THC  Substance and Sexual Activity   Alcohol use: Yes   Drug use: Yes    Frequency: 7.0 times per week    Types: Marijuana, Cocaine    Comment: marijuana daily, cocaine as needed   Sexual activity: Yes    Birth control/protection: Surgical, Condom    Comment: pt. declined condoms  Other Topics Concern   Not on file  Social History Narrative   Not on file   Social Determinants of Health   Financial Resource Strain: Low Risk  (02/04/2020)   Overall Financial Resource Strain (CARDIA)    Difficulty of Paying Living Expenses: Not very hard  Food Insecurity: No Food Insecurity (11/02/2022)   Hunger Vital Sign    Worried About Running Out of Food in the Last Year: Never true    Ran Out of Food in the Last Year: Never true  Transportation Needs: Unmet Transportation Needs (01/20/2023)   PRAPARE - Transportation    Lack of Transportation (Medical): Yes    Lack of Transportation (Non-Medical): Yes  Physical Activity: Inactive (02/04/2020)   Exercise Vital Sign    Days of Exercise per Week: 0 days    Minutes of Exercise per Session: 0 min  Stress: Stress Concern Present (02/04/2020)    [employer] of Occupational Health - Occupational Stress Questionnaire    Feeling of Stress : Very much  Social Connections: Moderately Isolated (02/04/2020)   Social Connection and Isolation Panel [NHANES]    Frequency of Communication with Friends and Family: More than three times a week    Frequency of Social Gatherings with Friends and Family: Never    Attends Religious Services: 1 to 4 times per year    Active Member of [employer] or Organizations: No    Attends Banker Meetings: Never    Marital Status: Never married    Hospital Course:   During the patient's hospitalization, patient had extensive initial psychiatric evaluation, and follow-up  psychiatric evaluations every day.  Psychiatric diagnoses provided upon initial assessment: Bipolar Disorder, GAD, Polysubstance Abuse (Cocaine, THC), and PTSD   Patient's psychiatric medications were adjusted on admission: Restarted Latuda.   During the hospitalization, other adjustments were made to the patient's psychiatric medication regimen: Due to past side effects and cost Latuda was stopped and she was started on Caplyta.   Patient's care was discussed during the interdisciplinary team meeting every day during the hospitalization.  The patient is not having side effects to prescribed psychiatric medication.  Gradually, patient started adjusting to milieu. The patient was evaluated each day by a clinical provider to ascertain response to treatment. Improvement was noted by the patient's report of decreasing symptoms, improved sleep and appetite, affect, medication tolerance, behavior, and participation in unit programming.  Patient was asked each day to complete a self inventory noting mood, mental status, pain, new symptoms, anxiety and concerns.   Symptoms were reported as significantly decreased or resolved completely by discharge.  The patient reports that their mood is stable.  The patient denied having suicidal  thoughts for more than 48 hours prior to discharge.  Patient denies having homicidal thoughts.  Patient denies having auditory hallucinations.  Patient denies any visual hallucinations or other symptoms of psychosis.  The patient was motivated to continue taking medication with a goal of continued improvement in mental health.   The patient reports their target psychiatric symptoms of depression and SI responded well to the psychiatric medications, and the patient reports overall benefit other psychiatric hospitalization. Supportive psychotherapy was provided to the patient. The patient also participated in regular group therapy while hospitalized. Coping skills, problem solving as well as relaxation therapies were also part of the unit programming.  Labs were reviewed with the patient, and abnormal results were discussed with the patient.  The patient is able to verbalize their individual safety plan to this provider.  # It is recommended to the patient to continue psychiatric medications as prescribed, after discharge from the hospital.    # It is recommended to the patient to follow up with your outpatient psychiatric provider and PCP.  # It was discussed with the patient, the impact of alcohol, drugs, tobacco have been there overall psychiatric and medical wellbeing, and total abstinence from substance use was recommended the patient.ed.  # Prescriptions provided or sent directly to preferred pharmacy at discharge. Patient agreeable to plan. Given opportunity to ask questions. Appears to feel comfortable with discharge.    # In the event of worsening symptoms, the patient is instructed to call the crisis hotline, 911 and or go to the nearest ED for appropriate evaluation and treatment of symptoms. To follow-up with primary care provider for other medical issues, concerns and or health care needs  # Patient was discharged [facility] with a plan to follow up as noted below.    On day  of discharge she reports she feels good.  She reports some concern about being homeless but states that going back home would be a bad environment for her so this is the best thing for her.  She reports [facility].  Encouraged her to contact her infectious disease clinic as they often have more resources to assist their patients.  She requests her medications be sent to [facility] outpatient pharmacy and discussed that this would be done.  Discussed with her that she would be provided with PCP resources to follow up on her IBS.  She reports  no SI, HI, or AVH.  She reports her sleep is good.  She reports her appetite is doing good.  She reports only concern to present.  [facility].   Physical Findings: AIMS:  , ,  ,  ,    CIWA:    COWS:     Musculoskeletal: Strength & Muscle Tone: within normal limits Gait & Station: normal Patient leans: N/A   Psychiatric Specialty Exam:  Presentation  General Appearance:  Appropriate for Environment; Casual; Fairly Groomed  Eye Contact: Good  Speech: Normal Rate; Clear and Coherent  Speech Volume: Normal  Handedness: Right   Mood and Affect  Mood: Euthymic  Affect: Appropriate; Congruent; Full Range   Thought Process  Thought Processes: Linear  Descriptions of Associations:Intact  Orientation:Full (Time, Place and Person)  Thought Content:Logical  History of Schizophrenia/Schizoaffective disorder:No  Duration of Psychotic Symptoms:No data recorded Hallucinations:Hallucinations: None  Ideas of Reference:None  Suicidal Thoughts:Suicidal Thoughts: No  Homicidal Thoughts:Homicidal Thoughts: No   Sensorium  Memory: Immediate Good; Recent Good; Remote Good  Judgment: Good  Insight: Good   Executive Functions  Concentration: Good  Attention Span: Good  Recall: Good  Fund of Knowledge: Good  Language: Good   Psychomotor Activity  Psychomotor  Activity: Psychomotor Activity: Normal   Assets  Assets: Communication Skills; Desire for Improvement; Resilience   Sleep  Sleep: Sleep: Fair Number of Hours of Sleep: 7    Physical Exam: Physical Exam Vitals and nursing note reviewed.  Constitutional:      General: She is not in acute distress.    Appearance: Normal appearance. She is normal weight. She is not ill-appearing or toxic-appearing.  HENT:     Head: Normocephalic and atraumatic.  Pulmonary:     Effort: Pulmonary effort is normal.  Musculoskeletal:        General: Normal range of motion.  Neurological:     General: No focal deficit present.     Mental Status: She is alert.    Review of Systems  Respiratory:  Negative for cough and shortness of breath.   Cardiovascular:  Negative for chest pain.  Gastrointestinal:  Negative for abdominal pain, constipation, diarrhea, nausea and vomiting.  Neurological:  Negative for dizziness, weakness and headaches.  Psychiatric/Behavioral:  Negative for depression, hallucinations and suicidal ideas. The patient is not nervous/anxious.    Blood pressure 120/78, pulse (!) 57, temperature 97.8 F (36.6 C), temperature source Oral, resp. rate 16, height 5\' 3"  (1.6 m), weight 78.9 kg, SpO2 98%. Body mass index is 30.82 kg/m.   Social History   Tobacco Use  Smoking Status Every Day   Current packs/day: 1.00   Average packs/day: 1 pack/day for 27.0 years (27.0 ttl pk-yrs)   Types: Cigarettes  Smokeless Tobacco Never  Tobacco Comments   patient not ready to quit; considering patches   Tobacco Cessation:  A prescription for an FDA-approved tobacco cessation medication was offered at discharge and the patient refused   Blood Alcohol level:  Lab Results  Component Value Date   [facility] <10 01/14/2023   ETH <10 05/15/2022    Metabolic Disorder Labs:  Lab Results  Component Value Date   HGBA1C 5.6 05/17/2022   MPG 114 05/17/2022   No results found for:  "PROLACTIN" Lab Results  Component Value Date   CHOL 195 05/17/2022   TRIG 182 (H) 05/17/2022   HDL 38 (L) 05/17/2022   CHOLHDL 5.1 05/17/2022   VLDL 36 05/17/2022   LDLCALC 121 (H)  05/17/2022   LDLCALC 172 (H) 10/30/2020    See Psychiatric Specialty Exam and Suicide Risk Assessment completed by Attending Physician prior to discharge.  Discharge destination:  Other:  [facility]  Is patient on multiple antipsychotic therapies at discharge:  No   Has Patient had three or more failed trials of antipsychotic monotherapy by history:  No  Recommended Plan for Multiple Antipsychotic Therapies: NA  Discharge Instructions     Diet - low sodium heart healthy   Complete by: As directed    Increase activity slowly   Complete by: As directed       Allergies as of 01/20/2023       Reactions   Doxycycline Swelling   Other Swelling   Mulberry fruit    Sulfonamide Derivatives Other (See Comments)   Childhood allergy.        Medication List     TAKE these medications      Indication  Biktarvy 50-200-25 MG Tabs tablet Generic drug: bictegravir-emtricitabine-tenofovir AF Take 1 tablet by mouth daily. Start taking on: January 21, 2023  Indication: HIV Disease   Caplyta 42 MG capsule Generic drug: lumateperone tosylate Take 1 capsule (42 mg) by mouth at bedtime.  Indication: Depressive Phase of Manic-Depression   hydrOXYzine 50 MG tablet Commonly known as: ATARAX Take 1 tablet (50 mg) by mouth 3 times daily as needed for anxiety.  Indication: Feeling Anxious   lisinopril 10 MG tablet Commonly known as: ZESTRIL Take 1 tablet (10 mg) by mouth daily.  Indication: High Blood Pressure Disorder   nicotine 14 mg/24hr patch Commonly known as: NICODERM CQ - dosed in mg/24 hours Place 1 patch (14 mg total) onto the skin daily at 6 (six) AM. Start taking on: January 21, 2023  Indication: Nicotine Addiction   traZODone 50 MG tablet Commonly known as: DESYREL Take 1 tablet  (50 mg) by mouth at bedtime as needed for sleep.  Indication: Major Depressive Disorder        Follow-up Information     [facility]. Go to.   Specialty: Behavioral Health Why: For residents of [facility] or homeless in [place], please go to this provider for an assessment, to obtain therapy and/or medication management services, during walk in hours: Monday through Friday, arrive at 7:00 am. Contact information: [address] [place] [address] [phone]        [facility]. Go to.   Why: You have a hospital follow up appointment to establish care with this provider on 01/31/23 at 1:45 pm with Dr. Annie Paras.  The appointment will be held in person. Contact information: [address] [place] [address] [phone]                Follow-up recommendations/Comments:    Activity: as tolerated   Diet: heart healthy   Other: -Follow-up with your outpatient psychiatric provider -instructions on appointment date, time, and address (location) are provided to you in discharge paperwork.   -Take your psychiatric medications as prescribed at discharge - instructions are provided to you in the discharge paperwork   -Follow-up with outpatient primary care doctor and other specialists -for management of chronic medical disease, including: Establish with PCP to further work up concerns for IBS.  Continue follow up with ID clinic.    -Testing: Follow-up with outpatient provider for abnormal lab results: None   -Recommend abstinence from alcohol, tobacco, and other illicit drug use at discharge.    -  If your psychiatric symptoms recur, worsen, or if you have side effects to your psychiatric medications, call your outpatient psychiatric provider, 911, [phone] or go to the nearest emergency department.   -If suicidal thoughts recur, call your outpatient psychiatric provider, 911, [phone] or go to  the nearest emergency department.  Signed: Lauro Franklin, MD 01/20/2023, 2:20 PM

## 2023-01-20 NOTE — Plan of Care (Signed)
  Problem: Education: Goal: Emotional status will improve Outcome: Progressing Goal: Mental status will improve Outcome: Progressing Goal: Verbalization of understanding the information provided will improve Outcome: Progressing   Problem: Activity: Goal: Interest or engagement in activities will improve Outcome: Progressing Goal: Sleeping patterns will improve Outcome: Progressing   Problem: Coping: Goal: Ability to verbalize frustrations and anger appropriately will improve Outcome: Progressing Goal: Ability to demonstrate self-control will improve Outcome: Progressing   Problem: Health Behavior/Discharge Planning: Goal: Identification of resources available to assist in meeting health care needs will improve Outcome: Progressing Goal: Compliance with treatment plan for underlying cause of condition will improve Outcome: Progressing   

## 2023-01-20 NOTE — Transportation (Signed)
01/20/2023  Lori [name] DOB: Apr 22, 1971 MRN: [id number]   RIDER WAIVER AND RELEASE OF LIABILITY  For the purposes of helping with transportation needs, [facility] partners with outside transportation providers (taxi companies, [employer], Catering manager.) to give [employer] patients or other approved people the choice of on-demand rides [employer]") to our buildings for non-emergency visits.  By using [employer], I, the person signing this document, on behalf of myself and/or any legal minors (in my care using the [employer]), agree:  Science writer given to me are supplied by independent, outside transportation providers who do not work for, or have any affiliation with, [employer]. [employer] is not a transportation company. [facility] has no control over the quality or safety of the rides I get using [employer]. [facility] has no control over whether any outside ride will happen on time or not. [facility] gives no guarantee on the reliability, quality, safety, or availability on any rides, or that no mistakes will happen. I know and accept that traveling by vehicle (car, truck, SVU, Zenaida Niece, bus, taxi, etc.) has risks of serious injuries such as disability, being paralyzed, and death. I know and agree the risk of using [employer] is mine alone, and not [employer]. Transport Services are provided "as is" and as are available. The transportation providers are in charge for all inspections and care of the vehicles used to provide these rides. I agree not to take legal action against [facility], its agents, employees, officers, directors, representatives, insurers, attorneys, assigns, successors, subsidiaries, and affiliates at any time for any reasons related directly or indirectly to using [employer]. I also agree not to take legal action against [facility] or its affiliates for any injury, death, or damage to property caused by or related to using  [employer]. I have read this Waiver and Release of Liability, and I understand the terms used in it and their legal meaning. This Waiver is freely and voluntarily given with the understanding that my right (or any legal minors) to legal action against  relating to [employer] is knowingly given up to use these services.   I attest that I read the Ride Waiver and Release of Liability to [name], gave Ms. Barrette the opportunity to ask questions and answered the questions asked (if any). I affirm that [name] then provided consent for assistance with transportation.

## 2023-01-20 NOTE — Care Management Important Message (Signed)
Medicare IM given to social work to give to the patient 

## 2023-01-20 NOTE — Progress Notes (Signed)
[facility] LCSW Progress Note   01/20/2023    10:23 AM  Silvestre Mesi J Gagner      Type of Note: Medicare Notice Given and Explained   Patient informed of right to appeal discharge, provided phone number to [facility]. Patient expressed no interest in appealing discharge at this time. CSW will continue to monitor situation.     Signed:   Jacob Moores, MSW, [facility] 01/20/2023 10:23 AM

## 2023-01-20 NOTE — Progress Notes (Signed)
I assumed care for Lori [name] at about 08:00.  She was resting in her room, alert, at baseline.Denied any new pain, denied any avh/hi/si. Pt was discharged from this facility at about 10:30. All discharge instructions handed over to pt, she veralized understanding. Vitals WNL at the time of discharge. She left via taxi

## 2023-01-26 ENCOUNTER — Other Ambulatory Visit: Payer: Self-pay

## 2023-01-31 ENCOUNTER — Ambulatory Visit: Payer: Medicare Other | Admitting: Student

## 2023-02-04 ENCOUNTER — Other Ambulatory Visit (HOSPITAL_COMMUNITY): Payer: Self-pay

## 2023-02-07 ENCOUNTER — Other Ambulatory Visit (HOSPITAL_COMMUNITY): Payer: Self-pay

## 2023-02-16 ENCOUNTER — Other Ambulatory Visit: Payer: Self-pay

## 2023-02-16 ENCOUNTER — Other Ambulatory Visit: Payer: Self-pay | Admitting: Infectious Diseases

## 2023-02-16 ENCOUNTER — Other Ambulatory Visit (HOSPITAL_COMMUNITY): Payer: Self-pay

## 2023-02-16 DIAGNOSIS — B20 Human immunodeficiency virus [HIV] disease: Secondary | ICD-10-CM

## 2023-02-16 MED ORDER — BIKTARVY 50-200-25 MG PO TABS
1.0000 | ORAL_TABLET | Freq: Every day | ORAL | 1 refills | Status: DC
  Filled 2023-02-16: qty 30, 30d supply, fill #0
  Filled 2023-03-21: qty 30, 30d supply, fill #1

## 2023-02-18 ENCOUNTER — Ambulatory Visit: Payer: Medicare Other | Admitting: Student

## 2023-03-15 ENCOUNTER — Ambulatory Visit: Payer: Medicare Other | Admitting: Internal Medicine

## 2023-03-21 ENCOUNTER — Other Ambulatory Visit (HOSPITAL_COMMUNITY): Payer: Self-pay | Admitting: Pharmacy Technician

## 2023-03-21 ENCOUNTER — Other Ambulatory Visit (HOSPITAL_COMMUNITY): Payer: Self-pay

## 2023-03-21 NOTE — Progress Notes (Signed)
Specialty Pharmacy Refill Coordination Note  Lori [name] is a 52 y.o. female contacted today regarding refills of specialty medication(s) Bictegravir-Emtricitab-Tenofov   Patient requested Delivery   Delivery date: 03/23/23   Verified address: [address] [name], [place]   Medication will be filled on 03/22/23.

## 2023-03-28 ENCOUNTER — Other Ambulatory Visit: Payer: Self-pay

## 2023-03-28 ENCOUNTER — Other Ambulatory Visit: Payer: Self-pay | Admitting: Infectious Diseases

## 2023-03-28 NOTE — Telephone Encounter (Addendum)
I called pt who stated she has a sinus infection and URI; she's requesting ABX rx. I told pt she might need an appt (Dr [name] has appts tomorrow)- she stated she does not have transportation to [place]; she lives in [place]. She stated if Dr [name] cannot give her a rx, she will go to an [facility] in [place] which is closer to her home. Or Dr [name], we can schedule a telehealth appt - let us know. Thanks

## 2023-03-29 ENCOUNTER — Other Ambulatory Visit: Payer: Self-pay

## 2023-03-29 ENCOUNTER — Other Ambulatory Visit (HOSPITAL_COMMUNITY): Payer: Self-pay

## 2023-03-29 MED ORDER — AMOXICILLIN-POT CLAVULANATE 875-125 MG PO TABS
1.0000 | ORAL_TABLET | Freq: Two times a day (BID) | ORAL | 0 refills | Status: AC
  Filled 2023-03-29: qty 28, 14d supply, fill #0

## 2023-03-29 NOTE — Telephone Encounter (Signed)
Pt called / informed rx for abx per Dr [name].

## 2023-03-30 ENCOUNTER — Other Ambulatory Visit: Payer: Self-pay

## 2023-03-30 ENCOUNTER — Other Ambulatory Visit (HOSPITAL_COMMUNITY): Payer: Self-pay

## 2023-03-31 ENCOUNTER — Other Ambulatory Visit (HOSPITAL_BASED_OUTPATIENT_CLINIC_OR_DEPARTMENT_OTHER): Payer: Self-pay

## 2023-03-31 ENCOUNTER — Other Ambulatory Visit: Payer: Self-pay

## 2023-04-01 ENCOUNTER — Other Ambulatory Visit (HOSPITAL_COMMUNITY): Payer: Self-pay

## 2023-04-12 ENCOUNTER — Other Ambulatory Visit: Payer: Self-pay | Admitting: Infectious Diseases

## 2023-04-12 ENCOUNTER — Other Ambulatory Visit: Payer: Self-pay

## 2023-04-12 DIAGNOSIS — B20 Human immunodeficiency virus [HIV] disease: Secondary | ICD-10-CM

## 2023-04-12 MED ORDER — BIKTARVY 50-200-25 MG PO TABS
1.0000 | ORAL_TABLET | Freq: Every day | ORAL | 1 refills | Status: DC
  Filled 2023-04-12: qty 30, 30d supply, fill #0
  Filled 2023-08-03: qty 30, 30d supply, fill #1

## 2023-04-12 NOTE — Progress Notes (Signed)
Refill received

## 2023-04-12 NOTE — Progress Notes (Signed)
Specialty Pharmacy Refill Coordination Note  Lori [name] is a 52 y.o. female contacted today regarding refills of specialty medication(s) Bictegravir-Emtricitab-Tenofov   Patient requested Delivery   Delivery date: 04/27/23   Verified address: [address] [name], [place]   Medication will be filled on 04/26/23.  Patient aware refill request pending. Notify if delayed.

## 2023-04-18 ENCOUNTER — Encounter: Payer: Self-pay | Admitting: Infectious Diseases

## 2023-04-21 ENCOUNTER — Other Ambulatory Visit: Payer: Self-pay

## 2023-04-21 ENCOUNTER — Other Ambulatory Visit (HOSPITAL_COMMUNITY): Payer: Self-pay

## 2023-04-26 ENCOUNTER — Other Ambulatory Visit: Payer: Self-pay | Admitting: Infectious Diseases

## 2023-04-26 ENCOUNTER — Other Ambulatory Visit: Payer: Self-pay

## 2023-04-26 ENCOUNTER — Telehealth: Payer: Self-pay | Admitting: *Deleted

## 2023-04-26 DIAGNOSIS — Z79899 Other long term (current) drug therapy: Secondary | ICD-10-CM

## 2023-04-26 DIAGNOSIS — B20 Human immunodeficiency virus [HIV] disease: Secondary | ICD-10-CM

## 2023-04-26 DIAGNOSIS — Z113 Encounter for screening for infections with a predominantly sexual mode of transmission: Secondary | ICD-10-CM

## 2023-04-26 NOTE — Telephone Encounter (Signed)
Front office stated they received a call from [place] at [facility]. Stated pt told them her labs will be done there instead of here. But they have not received any orders nor see any orders in EPIC.

## 2023-05-18 ENCOUNTER — Other Ambulatory Visit: Payer: Self-pay

## 2023-05-23 ENCOUNTER — Other Ambulatory Visit: Payer: Self-pay

## 2023-05-25 ENCOUNTER — Other Ambulatory Visit: Payer: Self-pay

## 2023-05-26 ENCOUNTER — Encounter (HOSPITAL_COMMUNITY): Payer: Self-pay

## 2023-05-26 ENCOUNTER — Other Ambulatory Visit (HOSPITAL_COMMUNITY): Payer: Self-pay

## 2023-08-03 ENCOUNTER — Other Ambulatory Visit: Payer: Self-pay

## 2023-08-03 ENCOUNTER — Other Ambulatory Visit: Payer: Self-pay | Admitting: Pharmacy Technician

## 2023-08-03 NOTE — Progress Notes (Signed)
Specialty Pharmacy Ongoing Clinical Assessment Note  Lori [name] is a 54 y.o. female who is being followed by the specialty pharmacy service for RxSp HIV   Patient's specialty medication(s) reviewed today: Bictegravir-Emtricitab-Tenofov (Biktarvy)   Missed doses in the last 4 weeks: 0   Patient/Caregiver did not have any additional questions or concerns.   Therapeutic benefit summary: Unable to assess (last HIV RNA 2023)   Adverse events/side effects summary: No adverse events/side effects   Patient's therapy is appropriate to: Continue    Goals Addressed   None     Follow up:  6 months  Bobette Mo Specialty Pharmacist

## 2023-08-03 NOTE — Progress Notes (Signed)
Specialty Pharmacy Refill Coordination Note  Lori [name] is a 53 y.o. female contacted today regarding refills of specialty medication(s) Bictegravir-Emtricitab-Tenofov [name]   Patient requested Delivery   Delivery date: 08/05/23   Verified address: Patient address [address]  [place]   Medication will be filled on 08/03/23.

## 2023-08-23 ENCOUNTER — Other Ambulatory Visit: Payer: Self-pay | Admitting: Infectious Diseases

## 2023-08-23 ENCOUNTER — Encounter: Payer: Self-pay | Admitting: *Deleted

## 2023-08-23 DIAGNOSIS — Z113 Encounter for screening for infections with a predominantly sexual mode of transmission: Secondary | ICD-10-CM

## 2023-08-23 DIAGNOSIS — Z79899 Other long term (current) drug therapy: Secondary | ICD-10-CM

## 2023-08-23 DIAGNOSIS — B20 Human immunodeficiency virus [HIV] disease: Secondary | ICD-10-CM

## 2023-08-25 ENCOUNTER — Other Ambulatory Visit (HOSPITAL_COMMUNITY): Payer: Self-pay

## 2023-08-25 ENCOUNTER — Other Ambulatory Visit: Payer: Self-pay

## 2023-08-25 ENCOUNTER — Other Ambulatory Visit: Payer: Self-pay | Admitting: Infectious Diseases

## 2023-08-25 DIAGNOSIS — B20 Human immunodeficiency virus [HIV] disease: Secondary | ICD-10-CM

## 2023-08-25 NOTE — Progress Notes (Signed)
 Specialty Pharmacy Refill Coordination Note  Lori [name] is a 53 y.o. female contacted today regarding refills of specialty medication(s) Bictegravir-Emtricitab-Tenofov Musician)   Patient requested Delivery   Delivery date: 08/29/23   Verified address: [address]   [place] [address]   Medication will be filled on 08/26/23.   This fill date is pending response to refill request from provider. Patient is aware and if they have not received fill by intended date, they must follow up with pharmacy.

## 2023-08-29 ENCOUNTER — Other Ambulatory Visit: Payer: Self-pay

## 2023-08-29 ENCOUNTER — Telehealth: Payer: Self-pay | Admitting: *Deleted

## 2023-08-29 ENCOUNTER — Other Ambulatory Visit (HOSPITAL_COMMUNITY): Payer: Self-pay

## 2023-08-29 ENCOUNTER — Other Ambulatory Visit: Payer: Self-pay | Admitting: Infectious Diseases

## 2023-08-29 DIAGNOSIS — B20 Human immunodeficiency virus [HIV] disease: Secondary | ICD-10-CM

## 2023-08-29 MED ORDER — BIKTARVY 50-200-25 MG PO TABS
1.0000 | ORAL_TABLET | Freq: Every day | ORAL | 1 refills | Status: DC
  Filled 2023-08-29: qty 30, 30d supply, fill #0
  Filled 2023-09-15: qty 30, 30d supply, fill #1

## 2023-08-29 NOTE — Progress Notes (Addendum)
 08/29/23-CASusanne [name]  Ship 03/18 via [employer]. Rx sent in on 03/17 after cut off time.

## 2023-08-29 NOTE — Addendum Note (Signed)
 Addended by: Maura Crandall on: 08/29/2023 10:22 AM   Modules accepted: Orders

## 2023-08-29 NOTE — Telephone Encounter (Signed)
 Copied from CRM [id number]. Topic: Appointments - Appointment Cancel/Reschedule >> Aug 29, 2023 10:02 AM Lori [name] wrote: Patient wants to move her labs closer to her home to [facility], patients callback number is [phone].

## 2023-08-29 NOTE — Telephone Encounter (Signed)
 Call to [facility] Pt can get labs obtained anytime before 4pm Pt aware No further action needed  Phone call complete.

## 2023-08-30 ENCOUNTER — Other Ambulatory Visit (HOSPITAL_COMMUNITY)
Admission: RE | Admit: 2023-08-30 | Discharge: 2023-08-30 | Disposition: A | Discharge status: Home / Self Care | Source: Ambulatory Visit | Attending: Infectious Diseases | Admitting: Infectious Diseases

## 2023-08-30 ENCOUNTER — Other Ambulatory Visit (HOSPITAL_COMMUNITY): Admit: 2023-08-30

## 2023-08-30 ENCOUNTER — Other Ambulatory Visit: Payer: Self-pay

## 2023-08-30 ENCOUNTER — Other Ambulatory Visit

## 2023-08-30 DIAGNOSIS — Z79899 Other long term (current) drug therapy: Secondary | ICD-10-CM | POA: Diagnosis present

## 2023-08-30 DIAGNOSIS — Z113 Encounter for screening for infections with a predominantly sexual mode of transmission: Secondary | ICD-10-CM | POA: Diagnosis present

## 2023-08-30 DIAGNOSIS — B20 Human immunodeficiency virus [HIV] disease: Secondary | ICD-10-CM | POA: Insufficient documentation

## 2023-08-30 LAB — CBC
HCT: 35.5 % — ABNORMAL LOW (ref 36.0–46.0)
Hemoglobin: 11.9 g/dL — ABNORMAL LOW (ref 12.0–15.0)
MCH: 32.9 pg (ref 26.0–34.0)
MCHC: 33.5 g/dL (ref 30.0–36.0)
MCV: 98.1 fL (ref 80.0–100.0)
Platelets: 267 10*3/uL (ref 150–400)
RBC: 3.62 MIL/uL — ABNORMAL LOW (ref 3.87–5.11)
RDW: 12.7 % (ref 11.5–15.5)
WBC: 9.6 10*3/uL (ref 4.0–10.5)
nRBC: 0 % (ref 0.0–0.2)

## 2023-08-30 LAB — COMPREHENSIVE METABOLIC PANEL
ALT: 15 U/L (ref 0–44)
AST: 17 U/L (ref 15–41)
Albumin: 3.6 g/dL (ref 3.5–5.0)
Alkaline Phosphatase: 61 U/L (ref 38–126)
Anion gap: 10 (ref 5–15)
BUN: 15 mg/dL (ref 6–20)
CO2: 24 mmol/L (ref 22–32)
Calcium: 9 mg/dL (ref 8.9–10.3)
Chloride: 105 mmol/L (ref 98–111)
Creatinine, Ser: 0.67 mg/dL (ref 0.44–1.00)
GFR, Estimated: >60 mL/min (ref >60)
Glucose, Bld: 92 mg/dL (ref 70–99)
Potassium: 4.2 mmol/L (ref 3.5–5.1)
Sodium: 139 mmol/L (ref 135–145)
Total Bilirubin: 0.3 mg/dL (ref 0.0–1.2)
Total Protein: 6.9 g/dL (ref 6.5–8.1)

## 2023-08-30 LAB — LIPID PANEL
Cholesterol: 191 mg/dL (ref 0–200)
HDL: 37 mg/dL — ABNORMAL LOW (ref >40)
LDL Cholesterol: 131 mg/dL — ABNORMAL HIGH (ref 0–99)
Total CHOL/HDL Ratio: 5.2 ratio
Triglycerides: 117 mg/dL (ref <150)
VLDL: 23 mg/dL (ref 0–40)

## 2023-08-31 LAB — URINE CYTOLOGY ANCILLARY ONLY
Chlamydia: NEGATIVE
Comment: NEGATIVE
Comment: NORMAL
Neisseria Gonorrhea: NEGATIVE

## 2023-08-31 LAB — RPR: RPR Ser Ql: NONREACTIVE

## 2023-08-31 LAB — T-HELPER CELLS (CD4) COUNT (NOT AT ARMC)
CD4 % Helper T Cell: 34 % (ref 33–65)
CD4 T Cell Abs: 434 /uL (ref 400–1790)

## 2023-09-01 LAB — HIV-1 RNA QUANT-NO REFLEX-BLD
HIV 1 RNA Quant: <20 {copies}/mL
LOG10 HIV-1 RNA: UNDETERMINED {Log_copies}/mL

## 2023-09-13 ENCOUNTER — Encounter: Admitting: Infectious Diseases

## 2023-09-14 ENCOUNTER — Other Ambulatory Visit (HOSPITAL_COMMUNITY): Payer: Self-pay

## 2023-09-15 ENCOUNTER — Other Ambulatory Visit: Payer: Self-pay

## 2023-09-15 NOTE — Progress Notes (Signed)
 Specialty Pharmacy Refill Coordination Note  LORALYE LOBERG is a 53 y.o. female contacted today regarding refills of specialty medication(s) Bictegravir-Emtricitab-Tenofov Musician)   Patient requested (Patient-Rptd) Delivery   Delivery date: 09/27/23   Verified address: (Patient-Rptd) [address] ,[place], [place] [address]   Medication will be filled on 09/26/23.

## 2023-09-20 ENCOUNTER — Encounter: Payer: Self-pay | Admitting: Infectious Diseases

## 2023-09-20 ENCOUNTER — Ambulatory Visit: Admitting: Infectious Diseases

## 2023-09-20 VITALS — BP 133/98 | HR 66 | Temp 97.8°F | Ht 64.0 in | Wt 160.6 lb

## 2023-09-20 DIAGNOSIS — F1410 Cocaine abuse, uncomplicated: Secondary | ICD-10-CM

## 2023-09-20 DIAGNOSIS — K623 Rectal prolapse: Secondary | ICD-10-CM | POA: Diagnosis not present

## 2023-09-20 DIAGNOSIS — F1014 Alcohol abuse with alcohol-induced mood disorder: Secondary | ICD-10-CM

## 2023-09-20 DIAGNOSIS — I10 Essential (primary) hypertension: Secondary | ICD-10-CM | POA: Diagnosis not present

## 2023-09-20 DIAGNOSIS — R911 Solitary pulmonary nodule: Secondary | ICD-10-CM | POA: Diagnosis not present

## 2023-09-20 DIAGNOSIS — Z79899 Other long term (current) drug therapy: Secondary | ICD-10-CM

## 2023-09-20 DIAGNOSIS — B20 Human immunodeficiency virus [HIV] disease: Secondary | ICD-10-CM

## 2023-09-20 DIAGNOSIS — Z113 Encounter for screening for infections with a predominantly sexual mode of transmission: Secondary | ICD-10-CM

## 2023-09-20 NOTE — Progress Notes (Signed)
 Subjective:    Patient ID: Lori [name], female  DOB: 1971-03-28, 53 y.o.        MRN: [id number]   HPI 53 yo F with HIV+ (dx 2004), anxiety d/o, anal/vaginal warts, AIN1 (HPV+), tobacco use.   Previously on atripla (made her depressed suicidal).  tried odefsy but couldn't tolerate. Then changed to genvoya which she went off of . States it makes her throw up. At her f/u 11-2017, she was changed to biktarvy.     Son Lori [name], born 2004, HIV - Doing well.   She has been "doing a lot of drugs.. .smoking crack" She would like to be seen in [place] for her rectal prolapse.  Denies missed ART.   Thinks she is going through menopause.  She has not had recent PAP, she will call Dr Benjie Karvonen. No pain or d/c. Does not feel abn lesions.  Can't remember recent mammo either.   HIV 1 RNA Quant  Date Value  08/30/2023 <20 copies/mL  08/17/2021 30 copies/mL  10/30/2020 Not Detected Copies/mL   CD4 T Cell Abs (/uL)  Date Value  08/30/2023 434  05/17/2022 807  10/30/2020 570     Health Maintenance  Topic Date Due  . Medicare Annual Wellness (AWV)  Never done  . Zoster Vaccines- Shingrix (1 of 2) Never done  . Colonoscopy  Never done  . Cervical Cancer Screening (Pap smear)  12/17/2020  . Lung Cancer Screening  12/20/2020  . COVID-19 Vaccine (4 - 2024-25 season) 02/13/2023  . MAMMOGRAM  08/29/2023  . INFLUENZA VACCINE  01/13/2024  . DTaP/Tdap/Td (2 - Td or Tdap) 11/17/2027  . Pneumococcal Vaccine 60-54 Years old (4 of 4 - PPSV23 or PCV20) 12/21/2035  . Hepatitis C Screening  Completed  . HIV Screening  Completed  . HPV VACCINES  Aged Out      Review of Systems  Constitutional:  Negative for chills, fever and weight loss.  Respiratory:  Positive for cough (yellow sputum. occas flecked with black. grey.) and sputum production. Negative for hemoptysis and shortness of breath.   Cardiovascular:  Positive for chest pain (she attributes to her cocaine.).  Gastrointestinal:  Negative  for constipation and diarrhea.  Genitourinary:  Negative for dysuria.    Please see HPI. All other systems reviewed and negative.     Objective:  Physical Exam Vitals reviewed.  Constitutional:      Appearance: Normal appearance.  HENT:     Mouth/Throat:     Mouth: Mucous membranes are moist.     Pharynx: No oropharyngeal exudate.  Eyes:     Extraocular Movements: Extraocular movements intact.     Pupils: Pupils are equal, round, and reactive to light.  Cardiovascular:     Rate and Rhythm: Normal rate and regular rhythm.  Pulmonary:     Effort: Pulmonary effort is normal.     Breath sounds: Normal breath sounds.  Abdominal:     General: Bowel sounds are normal. There is no distension.     Palpations: Abdomen is soft.     Tenderness: There is no abdominal tenderness.  Musculoskeletal:        General: Normal range of motion.     Cervical back: Normal range of motion and neck supple.     Right lower leg: No edema.     Left lower leg: No edema.  Skin:    General: Skin is warm.     Findings: No lesion.  Neurological:     General: No focal  deficit present.     Mental Status: She is alert.          Assessment & Plan:

## 2023-09-20 NOTE — Assessment & Plan Note (Signed)
 Encouraged pt that she is doing great on her meds Given condoms Will see her back in 6 months with labs

## 2023-09-20 NOTE — Assessment & Plan Note (Signed)
Will restart the lisinopril

## 2023-09-20 NOTE — Assessment & Plan Note (Signed)
 Encouraged to quit.  Defers counseling.

## 2023-09-20 NOTE — Assessment & Plan Note (Signed)
 Less ETOH vs previous .

## 2023-09-20 NOTE — Assessment & Plan Note (Signed)
 Will get her in with surgery at [facility]

## 2023-09-20 NOTE — Assessment & Plan Note (Signed)
 Smoked 1ppd for 40 year Screening CT

## 2023-09-26 ENCOUNTER — Other Ambulatory Visit: Payer: Self-pay

## 2023-10-18 ENCOUNTER — Other Ambulatory Visit: Payer: Self-pay

## 2023-10-18 ENCOUNTER — Other Ambulatory Visit (HOSPITAL_COMMUNITY): Payer: Self-pay

## 2023-10-18 ENCOUNTER — Other Ambulatory Visit: Payer: Self-pay | Admitting: Infectious Diseases

## 2023-10-18 DIAGNOSIS — B20 Human immunodeficiency virus [HIV] disease: Secondary | ICD-10-CM

## 2023-10-18 MED ORDER — BIKTARVY 50-200-25 MG PO TABS
1.0000 | ORAL_TABLET | Freq: Every day | ORAL | 1 refills | Status: DC
  Filled 2023-10-18: qty 30, 30d supply, fill #0
  Filled 2023-11-16: qty 30, 30d supply, fill #1

## 2023-10-18 NOTE — Progress Notes (Signed)
 Specialty Pharmacy Refill Coordination Note  Lori [name] is a 53 y.o. female contacted today regarding refills of specialty medication(s) Biktarvy .  Patient requested (Patient-Rptd) Delivery   Delivery date: (Patient-Rptd) 10/22/23   Verified address: (Patient-Rptd) [address] [place], [place] [address]   Medication will be filled on 10/21/23.   This fill date is pending response to refill request from provider. Patient is aware and if they have not received fill by intended date, they must follow up with pharmacy.

## 2023-10-21 ENCOUNTER — Other Ambulatory Visit: Payer: Self-pay

## 2023-11-16 ENCOUNTER — Other Ambulatory Visit: Payer: Self-pay

## 2023-11-16 NOTE — Progress Notes (Signed)
 Specialty Pharmacy Refill Coordination Note  Lori [name] is a 53 y.o. female contacted today regarding refills of specialty medication(s) Bictegravir-Emtricitab-Tenofov (Biktarvy )   Patient requested (Patient-Rptd) Delivery   Delivery date: (Patient-Rptd) 11/24/23   Verified address: (Patient-Rptd) [address] [place], [place] [address]   Medication will be filled on 11/23/23.

## 2023-12-05 ENCOUNTER — Ambulatory Visit (HOSPITAL_COMMUNITY)

## 2023-12-19 ENCOUNTER — Other Ambulatory Visit: Payer: Self-pay

## 2023-12-19 ENCOUNTER — Other Ambulatory Visit: Payer: Self-pay | Admitting: Infectious Diseases

## 2023-12-19 DIAGNOSIS — B20 Human immunodeficiency virus [HIV] disease: Secondary | ICD-10-CM

## 2023-12-19 NOTE — Progress Notes (Signed)
 Specialty Pharmacy Refill Coordination Note  Lori [name] is a 53 y.o. female contacted today regarding refills of specialty medication(s) Bictegravir-Emtricitab-Tenofov (Biktarvy )   Patient requested Delivery   Delivery date: 12/22/23   Verified address: [address] [place], [place] [address]   Medication will be filled on 12/21/23.   This fill date is pending response to refill request from provider. Patient is aware and if they have not received fill by intended date they must follow up with pharmacy.

## 2023-12-20 ENCOUNTER — Other Ambulatory Visit: Payer: Self-pay | Admitting: Infectious Diseases

## 2023-12-20 ENCOUNTER — Other Ambulatory Visit: Payer: Self-pay

## 2023-12-20 ENCOUNTER — Other Ambulatory Visit (HOSPITAL_COMMUNITY): Payer: Self-pay

## 2023-12-20 DIAGNOSIS — B20 Human immunodeficiency virus [HIV] disease: Secondary | ICD-10-CM

## 2023-12-21 ENCOUNTER — Other Ambulatory Visit: Payer: Self-pay

## 2023-12-21 ENCOUNTER — Other Ambulatory Visit (HOSPITAL_COMMUNITY): Payer: Self-pay

## 2023-12-22 ENCOUNTER — Other Ambulatory Visit (HOSPITAL_COMMUNITY): Payer: Self-pay

## 2023-12-22 ENCOUNTER — Telehealth: Payer: Self-pay | Admitting: Infectious Diseases

## 2023-12-22 ENCOUNTER — Other Ambulatory Visit: Payer: Self-pay

## 2023-12-22 ENCOUNTER — Other Ambulatory Visit: Payer: Self-pay | Admitting: *Deleted

## 2023-12-22 DIAGNOSIS — B20 Human immunodeficiency virus [HIV] disease: Secondary | ICD-10-CM

## 2023-12-22 MED ORDER — BIKTARVY 50-200-25 MG PO TABS
1.0000 | ORAL_TABLET | Freq: Every day | ORAL | 1 refills | Status: DC
  Filled 2023-12-22: qty 30, 30d supply, fill #0
  Filled 2024-01-12 – 2024-01-13 (×2): qty 30, 30d supply, fill #1

## 2023-12-22 NOTE — Telephone Encounter (Signed)
 I just tried to refill Biktarvy ; [facility] stated they did not receive it. Dr Eben is Offline - sending to [facility] to refill. Pt's LOV was 09/20/23.

## 2023-12-22 NOTE — Telephone Encounter (Signed)
 Appointment will be scheduled by the front desk.  Patient will be sent message via my chart when appointment date and time.  Message has been forwarded to the triage nurse in reference to medication being denied.   Copied from CRM [id number]. Topic: Appointments - Scheduling Inquiry for Clinic >> Dec 21, 2023  2:16 PM Lori [name] wrote: Reason for CRM: Patient called. Wanted to know why medication was denied. Shows patient needs appt. Patient states she needs to have blood work done. Was told she can do that at [facility]. She would like to set appt as well. She lives pretty far away. Dr Eben patient. Would like to know what to do about medication in the mean time.Thank You    ----------------------------------------------------------------------- From previous Reason for Contact - Scheduling: Patient/patient representative is calling to schedule an appointment. Refer to attachments for appointment information.

## 2023-12-22 NOTE — Telephone Encounter (Signed)
 LOV - 09/20/23 with Dr Eben.

## 2023-12-28 NOTE — Telephone Encounter (Signed)
 Biktarvy  was refilled 7/10.

## 2024-01-12 ENCOUNTER — Other Ambulatory Visit: Payer: Self-pay

## 2024-01-13 ENCOUNTER — Other Ambulatory Visit (HOSPITAL_COMMUNITY): Payer: Self-pay

## 2024-01-13 ENCOUNTER — Other Ambulatory Visit: Payer: Self-pay

## 2024-01-13 NOTE — Progress Notes (Signed)
 Specialty Pharmacy Refill Coordination Note  Spoke with Lori [name]  Lori [name] is a 53 y.o. female contacted today regarding refills of specialty medication(s) Bictegravir-Emtricitab-Tenofov (Biktarvy )  Doses on hand: 12  Patient requested: Delivery   Delivery date: 01/17/24   Verified address: [address] [place] [address]  Medication will be filled on 01/16/24.

## 2024-02-02 ENCOUNTER — Ambulatory Visit (HOSPITAL_COMMUNITY)

## 2024-02-08 ENCOUNTER — Other Ambulatory Visit: Payer: Self-pay | Admitting: Infectious Diseases

## 2024-02-08 ENCOUNTER — Other Ambulatory Visit (HOSPITAL_COMMUNITY): Payer: Self-pay

## 2024-02-08 DIAGNOSIS — B20 Human immunodeficiency virus [HIV] disease: Secondary | ICD-10-CM

## 2024-02-09 ENCOUNTER — Other Ambulatory Visit: Payer: Self-pay

## 2024-02-09 MED ORDER — BIKTARVY 50-200-25 MG PO TABS
1.0000 | ORAL_TABLET | Freq: Every day | ORAL | 1 refills | Status: DC
  Filled 2024-02-09 – 2024-02-17 (×2): qty 30, 30d supply, fill #0
  Filled 2024-03-19 – 2024-03-21 (×2): qty 30, 30d supply, fill #1

## 2024-02-17 ENCOUNTER — Other Ambulatory Visit: Payer: Self-pay

## 2024-02-20 ENCOUNTER — Other Ambulatory Visit: Payer: Self-pay

## 2024-02-20 ENCOUNTER — Encounter (INDEPENDENT_AMBULATORY_CARE_PROVIDER_SITE_OTHER): Payer: Self-pay

## 2024-02-20 ENCOUNTER — Other Ambulatory Visit (HOSPITAL_COMMUNITY): Payer: Self-pay

## 2024-02-20 NOTE — Progress Notes (Signed)
 Specialty Pharmacy Refill Coordination Note  MyChart Questionnaire Submission  Lori [name] is a 53 y.o. female contacted today regarding refills of specialty medication(s) Biktarvy .  Doses on hand: (Patient-Rptd) 5   Patient requested: (Patient-Rptd) Delivery   Delivery date: 02/22/24  Verified address: [address] [place] [address]  Medication will be filled on 02/21/24.

## 2024-03-19 ENCOUNTER — Other Ambulatory Visit: Payer: Self-pay

## 2024-03-21 ENCOUNTER — Other Ambulatory Visit: Payer: Self-pay

## 2024-03-21 NOTE — Progress Notes (Signed)
 Specialty Pharmacy Refill Coordination Note  Lori [name] is a 53 y.o. female contacted today regarding refills of specialty medication(s) Bictegravir-Emtricitab-Tenofov (Biktarvy )   Patient requested Delivery   Delivery date: 03/23/24   Verified address: [address], [place], [address]   Medication will be filled on 03/22/24.

## 2024-04-12 ENCOUNTER — Other Ambulatory Visit: Payer: Self-pay | Admitting: Infectious Diseases

## 2024-04-12 ENCOUNTER — Other Ambulatory Visit (HOSPITAL_COMMUNITY): Payer: Self-pay

## 2024-04-12 DIAGNOSIS — B20 Human immunodeficiency virus [HIV] disease: Secondary | ICD-10-CM

## 2024-04-16 ENCOUNTER — Other Ambulatory Visit: Payer: Self-pay

## 2024-04-16 MED ORDER — BIKTARVY 50-200-25 MG PO TABS
1.0000 | ORAL_TABLET | Freq: Every day | ORAL | 1 refills | Status: DC
  Filled 2024-04-16 – 2024-04-24 (×3): qty 30, 30d supply, fill #0
  Filled 2024-05-18: qty 30, 30d supply, fill #1

## 2024-04-17 ENCOUNTER — Other Ambulatory Visit: Payer: Self-pay

## 2024-04-19 ENCOUNTER — Other Ambulatory Visit: Payer: Self-pay

## 2024-04-24 ENCOUNTER — Other Ambulatory Visit: Payer: Self-pay

## 2024-04-24 NOTE — Progress Notes (Signed)
 Specialty Pharmacy Refill Coordination Note  Lori [name] is a 53 y.o. female contacted today regarding refills of specialty medication(s) Bictegravir-Emtricitab-Tenofov (Biktarvy )   Patient requested Delivery   Delivery date: 04/26/24   Verified address: [address], [place], [address]   Medication will be filled on: 04/25/24

## 2024-04-24 NOTE — Progress Notes (Signed)
 Specialty Pharmacy Ongoing Clinical Assessment Note  Lori [name] is a 53 y.o. female who is being followed by the specialty pharmacy service for RxSp HIV   Patient's specialty medication(s) reviewed today: Bictegravir-Emtricitab-Tenofov (Biktarvy )   Missed doses in the last 4 weeks: 0   Patient/Caregiver did not have any additional questions or concerns.   Therapeutic benefit summary: Patient is achieving benefit   Adverse events/side effects summary: No adverse events/side effects   Patient's therapy is appropriate to: Continue    Goals Addressed             This Visit's Progress    Achieve Undetectable HIV Viral Load < 20   On track    Patient is on track. Patient will maintain adherence. Patient's viral load remains undetectable long term         Follow up: 12 months  [facility]

## 2024-04-25 ENCOUNTER — Other Ambulatory Visit: Payer: Self-pay

## 2024-05-18 ENCOUNTER — Other Ambulatory Visit: Payer: Self-pay

## 2024-05-21 ENCOUNTER — Other Ambulatory Visit: Payer: Self-pay

## 2024-05-21 ENCOUNTER — Other Ambulatory Visit (HOSPITAL_COMMUNITY): Payer: Self-pay

## 2024-05-21 NOTE — Progress Notes (Signed)
 Specialty Pharmacy Refill Coordination Note  MyChart Questionnaire Submission  Lori [name] is a 53 y.o. female contacted today regarding refills of specialty medication(s) Biktarvy .  Doses on hand: 5 on 12/6   Patient requested: Delivery  Delivery date: 05/22/24  Verified address: [address] [place] [address] ([employer])  Medication will be filled on 05/21/24.

## 2024-06-02 ENCOUNTER — Inpatient Hospital Stay (HOSPITAL_COMMUNITY)
Admission: EM | Admit: 2024-06-02 | Discharge: 2024-06-05 | DRG: 917 | Disposition: A | Discharge status: Home / Self Care | Attending: Internal Medicine | Admitting: Internal Medicine

## 2024-06-02 ENCOUNTER — Other Ambulatory Visit: Payer: Self-pay

## 2024-06-02 ENCOUNTER — Emergency Department (HOSPITAL_COMMUNITY)

## 2024-06-02 ENCOUNTER — Encounter (HOSPITAL_COMMUNITY): Payer: Self-pay

## 2024-06-02 DIAGNOSIS — Z8049 Family history of malignant neoplasm of other genital organs: Secondary | ICD-10-CM

## 2024-06-02 DIAGNOSIS — I471 Supraventricular tachycardia, unspecified: Secondary | ICD-10-CM | POA: Diagnosis present

## 2024-06-02 DIAGNOSIS — Z90710 Acquired absence of both cervix and uterus: Secondary | ICD-10-CM

## 2024-06-02 DIAGNOSIS — B20 Human immunodeficiency virus [HIV] disease: Secondary | ICD-10-CM | POA: Diagnosis present

## 2024-06-02 DIAGNOSIS — I480 Paroxysmal atrial fibrillation: Secondary | ICD-10-CM | POA: Diagnosis present

## 2024-06-02 DIAGNOSIS — F17210 Nicotine dependence, cigarettes, uncomplicated: Secondary | ICD-10-CM | POA: Diagnosis present

## 2024-06-02 DIAGNOSIS — F419 Anxiety disorder, unspecified: Secondary | ICD-10-CM | POA: Diagnosis present

## 2024-06-02 DIAGNOSIS — I252 Old myocardial infarction: Secondary | ICD-10-CM

## 2024-06-02 DIAGNOSIS — I214 Non-ST elevation (NSTEMI) myocardial infarction: Principal | ICD-10-CM | POA: Diagnosis present

## 2024-06-02 DIAGNOSIS — Z6834 Body mass index (BMI) 34.0-34.9, adult: Secondary | ICD-10-CM

## 2024-06-02 DIAGNOSIS — Z888 Allergy status to other drugs, medicaments and biological substances status: Secondary | ICD-10-CM

## 2024-06-02 DIAGNOSIS — I10 Essential (primary) hypertension: Secondary | ICD-10-CM | POA: Diagnosis present

## 2024-06-02 DIAGNOSIS — F1014 Alcohol abuse with alcohol-induced mood disorder: Secondary | ICD-10-CM | POA: Diagnosis present

## 2024-06-02 DIAGNOSIS — Z7982 Long term (current) use of aspirin: Secondary | ICD-10-CM

## 2024-06-02 DIAGNOSIS — F1410 Cocaine abuse, uncomplicated: Secondary | ICD-10-CM | POA: Diagnosis present

## 2024-06-02 DIAGNOSIS — Z800 Family history of malignant neoplasm of digestive organs: Secondary | ICD-10-CM

## 2024-06-02 DIAGNOSIS — Z882 Allergy status to sulfonamides status: Secondary | ICD-10-CM

## 2024-06-02 DIAGNOSIS — Z5982 Transportation insecurity: Secondary | ICD-10-CM

## 2024-06-02 DIAGNOSIS — F32A Depression, unspecified: Secondary | ICD-10-CM | POA: Diagnosis present

## 2024-06-02 DIAGNOSIS — F17200 Nicotine dependence, unspecified, uncomplicated: Secondary | ICD-10-CM | POA: Diagnosis present

## 2024-06-02 DIAGNOSIS — F14129 Cocaine abuse with intoxication, unspecified: Secondary | ICD-10-CM | POA: Diagnosis present

## 2024-06-02 DIAGNOSIS — Z79899 Other long term (current) drug therapy: Secondary | ICD-10-CM

## 2024-06-02 DIAGNOSIS — T405X1A Poisoning by cocaine, accidental (unintentional), initial encounter: Principal | ICD-10-CM | POA: Diagnosis present

## 2024-06-02 DIAGNOSIS — Z5941 Food insecurity: Secondary | ICD-10-CM

## 2024-06-02 DIAGNOSIS — Z801 Family history of malignant neoplasm of trachea, bronchus and lung: Secondary | ICD-10-CM

## 2024-06-02 DIAGNOSIS — Z8249 Family history of ischemic heart disease and other diseases of the circulatory system: Secondary | ICD-10-CM

## 2024-06-02 DIAGNOSIS — E785 Hyperlipidemia, unspecified: Secondary | ICD-10-CM | POA: Diagnosis present

## 2024-06-02 DIAGNOSIS — Z881 Allergy status to other antibiotic agents status: Secondary | ICD-10-CM

## 2024-06-02 DIAGNOSIS — E66811 Obesity, class 1: Secondary | ICD-10-CM | POA: Diagnosis present

## 2024-06-02 LAB — CBC
HCT: 37.2 % (ref 36.0–46.0)
Hemoglobin: 12.7 g/dL (ref 12.0–15.0)
MCH: 33.6 pg (ref 26.0–34.0)
MCHC: 34.1 g/dL (ref 30.0–36.0)
MCV: 98.4 fL (ref 80.0–100.0)
Platelets: 249 K/uL (ref 150–400)
RBC: 3.78 MIL/uL — ABNORMAL LOW (ref 3.87–5.11)
RDW: 12.3 % (ref 11.5–15.5)
WBC: 8.2 K/uL (ref 4.0–10.5)
nRBC: 0 % (ref 0.0–0.2)

## 2024-06-02 LAB — MAGNESIUM: Magnesium: 2.1 mg/dL (ref 1.7–2.4)

## 2024-06-02 LAB — BASIC METABOLIC PANEL WITH GFR
Anion gap: 9 (ref 5–15)
BUN: 16 mg/dL (ref 6–20)
CO2: 26 mmol/L (ref 22–32)
Calcium: 8.5 mg/dL — ABNORMAL LOW (ref 8.9–10.3)
Chloride: 105 mmol/L (ref 98–111)
Creatinine, Ser: 0.76 mg/dL (ref 0.44–1.00)
GFR, Estimated: >60 mL/min
Glucose, Bld: 134 mg/dL — ABNORMAL HIGH (ref 70–99)
Potassium: 3.6 mmol/L (ref 3.5–5.1)
Sodium: 140 mmol/L (ref 135–145)

## 2024-06-02 LAB — TROPONIN T, HIGH SENSITIVITY: Troponin T High Sensitivity: 86 ng/L — ABNORMAL HIGH (ref 0–19)

## 2024-06-02 MED ORDER — LISINOPRIL 10 MG PO TABS
10.0000 mg | ORAL_TABLET | Freq: Once | ORAL | Status: AC
  Administered 2024-06-02: 10 mg via ORAL
  Filled 2024-06-02: qty 1

## 2024-06-02 MED ORDER — HYDROMORPHONE HCL 1 MG/ML IJ SOLN
1.0000 mg | Freq: Once | INTRAMUSCULAR | Status: AC
  Administered 2024-06-02: 1 mg via INTRAVENOUS
  Filled 2024-06-02: qty 1

## 2024-06-02 MED ORDER — NITROGLYCERIN IN D5W 200-5 MCG/ML-% IV SOLN
5.0000 ug/min | INTRAVENOUS | Status: DC
  Administered 2024-06-03 (×2): 200 ug/min via INTRAVENOUS
  Administered 2024-06-03: 25 ug/min via INTRAVENOUS
  Administered 2024-06-03 – 2024-06-04 (×2): 200 ug/min via INTRAVENOUS
  Administered 2024-06-04: 75 ug/min via INTRAVENOUS
  Administered 2024-06-04 (×2): 200 ug/min via INTRAVENOUS
  Administered 2024-06-05: 100 ug/min via INTRAVENOUS
  Filled 2024-06-02 (×10): qty 250

## 2024-06-02 MED ORDER — NITROGLYCERIN 2 % TD OINT
1.0000 [in_us] | TOPICAL_OINTMENT | Freq: Once | TRANSDERMAL | Status: AC
  Administered 2024-06-03: 1 [in_us] via TOPICAL
  Filled 2024-06-02: qty 1

## 2024-06-02 MED ORDER — ASPIRIN 81 MG PO CHEW: 324.0000 mg | CHEWABLE_TABLET | Freq: Once | ORAL | Status: DC

## 2024-06-02 NOTE — ED Triage Notes (Signed)
 Pt called EMS with c/o chest pain. EMS reports the pain is more in the right arm - cramping and sharp, shooting, radiating into right chest.

## 2024-06-02 NOTE — ED Notes (Signed)
 Repeat EKG done per MD request and provided to MD.

## 2024-06-02 NOTE — ED Provider Notes (Signed)
 " [place] EM[facility]GENCY DE[facility]MENT AT [facility] Provider Note   CSN: [id number] Arrival date & time: 06/02/24  2210     Patient presents with: Chest Pain   Lori [name] is a 53 y.o. female.  {Add pertinent medical, surgical, social history, OB history to YEP:67052}  Chest Pain Associated symptoms: nausea   Patient presents for chest pain.  Medical history includes polysubstance abuse, HIV, anxiety, anemia, HTN.  Earlier today, patient was in her normal state of health.  Approximately 1 hour prior to arrival, she had onset of right arm pain which migrated into central chest.  She was at rest at the time.  Initial pain was severe.  She did have associated nausea.  With EMS, she was given 324 of ASA, morphine , and Zofran .  Her nausea has resolved.  Current pain remains at 7/10 severity.     Prior to Admission medications  Medication Sig Start Date End Date Taking? Authorizing Provider  bictegravir-emtricitabine -tenofovir  AF (BIKTARVY ) 50-200-25 MG TABS tablet Take 1 tablet by mouth daily. 04/16/24   Eben Reyes BROCKS, MD  hydrOXYzine  (ATARAX ) 50 MG tablet Take 1 tablet (50 mg) by mouth 3 times daily as needed for anxiety. 01/20/23   Raliegh Marsa RAMAN, DO  lisinopril  (ZESTRIL ) 10 MG tablet Take 1 tablet (10 mg) by mouth daily. 01/20/23   Raliegh Marsa RAMAN, DO  lumateperone  tosylate (CAPLYTA ) 42 MG capsule Take 1 capsule (42 mg) by mouth at bedtime. 01/20/23   Pashayan, Marsa RAMAN, DO  nicotine  (NICODERM CQ  - DOSED IN MG/24 HOURS) 14 mg/24hr patch Place 1 patch (14 mg total) onto the skin daily at 6 (six) AM. 01/21/23   Massengill, Rankin, MD  traZODone  (DESYREL ) 50 MG tablet Take 1 tablet (50 mg) by mouth at bedtime as needed for sleep. 01/20/23 02/19/23  Raliegh Marsa RAMAN, DO  diphenhydramine -acetaminophen  (TYLENOL  PM) 25-500 MG TABS tablet Take 1 tablet by mouth at bedtime as needed (sleep).  08/23/19  [provider]    Allergies: Doxycycline, Other, and  Sulfonamide derivatives    Review of Systems  Cardiovascular:  Positive for chest pain.  Gastrointestinal:  Positive for nausea.  All other systems reviewed and are negative.   Updated Vital Signs BP (!) 213/122   Pulse 74   Temp 97.8 F (36.6 C) (Oral)   Resp 18   Ht 5' 4 (1.626 m)   Wt 90.7 kg   SpO2 92%   BMI 34.33 kg/m   Physical Exam Vitals and nursing note reviewed.  Constitutional:      General: She is not in acute distress.    Appearance: She is well-developed. She is not ill-appearing, toxic-appearing or diaphoretic.  HENT:     Head: Normocephalic and atraumatic.  Eyes:     Conjunctiva/sclera: Conjunctivae normal.  Cardiovascular:     Rate and Rhythm: Normal rate and regular rhythm.     Heart sounds: No murmur heard. Pulmonary:     Effort: Pulmonary effort is normal. No tachypnea or respiratory distress.     Breath sounds: Normal breath sounds. No decreased breath sounds, wheezing, rhonchi or rales.  Chest:     Chest wall: No tenderness.  Abdominal:     Palpations: Abdomen is soft.     Tenderness: There is no abdominal tenderness.  Musculoskeletal:        General: No swelling. Normal range of motion.     Cervical back: Normal range of motion and neck supple.  Skin:    General: Skin is  warm and dry.     Capillary Refill: Capillary refill takes less than 2 seconds.     Coloration: Skin is not cyanotic or pale.  Neurological:     General: No focal deficit present.     Mental Status: She is alert and oriented to person, place, and time.  Psychiatric:        Mood and Affect: Mood normal.        Behavior: Behavior normal.     (all labs ordered are listed, but only abnormal results are displayed) Labs Reviewed  BASIC METABOLIC PANEL WITH GFR - Abnormal; Notable for the following components:      Result Value   Glucose, Bld 134 (*)    Calcium  8.5 (*)    All other components within normal limits  CBC - Abnormal; Notable for the following components:    RBC 3.78 (*)    All other components within normal limits  TROPONIN T, HIGH SENSITIVITY - Abnormal; Notable for the following components:   Troponin T High Sensitivity 86 (*)    All other components within normal limits  URINE DRUG SCREEN  MAGNESIUM     EKG: EKG Interpretation Date/Time:  Saturday June 02 2024 22:25:36 EST Ventricular Rate:  75 PR Interval:  206 QRS Duration:  96 QT Interval:  411 QTC Calculation: 460 R Axis:   65  Text Interpretation: Sinus rhythm Borderline prolonged PR interval Nonspecific repol abnormality, diffuse leads Minimal ST elevation, lateral leads ST changes new from prior 5/24 Confirmed by Towana Sharper [phone]) on 06/02/2024 10:27:38 PM  Radiology: [facility] Chest Port 1 View Result Date: 06/02/2024 EXAM: 1 VIEW(S) XRAY OF THE CHEST 06/02/2024 10:44:07 PM COMPARISON: 09/11/2017 CLINICAL HISTORY: cp FINDINGS: LUNGS AND PLEURA: No focal pulmonary opacity. No pleural effusion. No pneumothorax. HEART AND MEDIASTINUM: No acute abnormality of the cardiomediastinal silhouette. BONES AND SOFT TISSUES: No acute osseous abnormality. IMPRESSION: 1. No acute cardiopulmonary process. Electronically signed by: Norman Gatlin MD 06/02/2024 10:48 PM EST RP Workstation: [id number]    {Document cardiac monitor, telemetry assessment procedure when appropriate:32947} Procedures   Medications Ordered in the ED  lisinopril  (ZESTRIL ) tablet 10 mg (has no administration in time range)  nitroGLYCERIN  (NITROGLYN) 2 % ointment 1 inch (has no administration in time range)  nitroGLYCERIN  50 mg in dextrose 5 % 250 mL (0.2 mg/mL) infusion (has no administration in time range)  HYDROmorphone  (DILAUDID ) injection 1 mg (has no administration in time range)      {Click here for ABCD2, HEART and other calculators REFRESH Note before signing:1}                              Medical Decision Making Amount and/or Complexity of Data Reviewed Labs: ordered.  Risk Prescription drug  management.   This patient presents to the ED for concern of ***, this involves an extensive number of treatment options, and is a complaint that carries with it a high risk of complications and morbidity.  The differential diagnosis includes ***   Co morbidities / Chronic conditions that complicate the patient evaluation  ***   Additional history obtained:  Additional history obtained from EMR External records from outside source obtained and reviewed including ***   Lab Tests:  I Ordered, and personally interpreted labs.  The pertinent results include:  ***   Imaging Studies ordered:  I ordered imaging studies including ***  I independently visualized and interpreted imaging which showed *** I agree with  the radiologist interpretation   Cardiac Monitoring: / EKG:  The patient was maintained on a cardiac monitor.  I personally viewed and interpreted the cardiac monitored which showed an underlying rhythm of: ***   Problem List / ED Course / Critical interventions / Medication management  Patient presenting for acute onset of chest pain this evening.  She describes a right arm pain which migrated into her chest approximately 1 hour prior to arrival.  On arrival in the ED, vital signs are notable for hypertension.  Patient is prescribed lisinopril  but has not been taking it.  She currently has ongoing 7/10 severity chest pain.  She was given 324 of ASA with EMS.  EKG does not show evidence of STEMI.  Her initial troponin, however, is mildly elevated at 86.  This raises concern for NSTEMI versus hypertensive crisis.  NTG was ordered for her continued elevated blood pressure.  Dilaudid  was ordered for analgesia.*** I ordered medication including ***   Reevaluation of the patient after these medicines showed that the patient *** I have reviewed the patients home medicines and have made adjustments as needed   Consultations Obtained:  I requested consultation with the ***,  and  discussed lab and imaging findings as well as pertinent plan - they recommend: ***   Social Determinants of Health:  ***   Test / Admission - Considered:  ***   {Document critical care time when appropriate  Document review of labs and clinical decision tools ie CHADS2VASC2, etc  Document your independent review of radiology images and any outside records  Document your discussion with family members, caretakers and with consultants  Document social determinants of health affecting pt's care  Document your decision making why or why not admission, treatments were needed:32947:::1}   Final diagnoses:  None    ED Discharge Orders     None        "

## 2024-06-03 ENCOUNTER — Inpatient Hospital Stay (HOSPITAL_COMMUNITY)

## 2024-06-03 ENCOUNTER — Other Ambulatory Visit (HOSPITAL_COMMUNITY): Payer: Self-pay | Admitting: *Deleted

## 2024-06-03 DIAGNOSIS — R6889 Other general symptoms and signs: Secondary | ICD-10-CM | POA: Diagnosis not present

## 2024-06-03 DIAGNOSIS — Z888 Allergy status to other drugs, medicaments and biological substances status: Secondary | ICD-10-CM | POA: Diagnosis not present

## 2024-06-03 DIAGNOSIS — F1014 Alcohol abuse with alcohol-induced mood disorder: Secondary | ICD-10-CM

## 2024-06-03 DIAGNOSIS — I10 Essential (primary) hypertension: Secondary | ICD-10-CM

## 2024-06-03 DIAGNOSIS — R0789 Other chest pain: Secondary | ICD-10-CM | POA: Diagnosis present

## 2024-06-03 DIAGNOSIS — F17200 Nicotine dependence, unspecified, uncomplicated: Secondary | ICD-10-CM | POA: Diagnosis not present

## 2024-06-03 DIAGNOSIS — F32A Depression, unspecified: Secondary | ICD-10-CM | POA: Diagnosis present

## 2024-06-03 DIAGNOSIS — I252 Old myocardial infarction: Secondary | ICD-10-CM | POA: Diagnosis not present

## 2024-06-03 DIAGNOSIS — F419 Anxiety disorder, unspecified: Secondary | ICD-10-CM | POA: Diagnosis present

## 2024-06-03 DIAGNOSIS — R079 Chest pain, unspecified: Secondary | ICD-10-CM

## 2024-06-03 DIAGNOSIS — F1410 Cocaine abuse, uncomplicated: Secondary | ICD-10-CM

## 2024-06-03 DIAGNOSIS — Z881 Allergy status to other antibiotic agents status: Secondary | ICD-10-CM | POA: Diagnosis not present

## 2024-06-03 DIAGNOSIS — E785 Hyperlipidemia, unspecified: Secondary | ICD-10-CM | POA: Diagnosis present

## 2024-06-03 DIAGNOSIS — I480 Paroxysmal atrial fibrillation: Secondary | ICD-10-CM | POA: Diagnosis present

## 2024-06-03 DIAGNOSIS — Z8249 Family history of ischemic heart disease and other diseases of the circulatory system: Secondary | ICD-10-CM | POA: Diagnosis not present

## 2024-06-03 DIAGNOSIS — Z79899 Other long term (current) drug therapy: Secondary | ICD-10-CM | POA: Diagnosis not present

## 2024-06-03 DIAGNOSIS — T405X1A Poisoning by cocaine, accidental (unintentional), initial encounter: Secondary | ICD-10-CM | POA: Diagnosis present

## 2024-06-03 DIAGNOSIS — Z90710 Acquired absence of both cervix and uterus: Secondary | ICD-10-CM | POA: Diagnosis not present

## 2024-06-03 DIAGNOSIS — Z8049 Family history of malignant neoplasm of other genital organs: Secondary | ICD-10-CM | POA: Diagnosis not present

## 2024-06-03 DIAGNOSIS — E66811 Obesity, class 1: Secondary | ICD-10-CM | POA: Diagnosis present

## 2024-06-03 DIAGNOSIS — I214 Non-ST elevation (NSTEMI) myocardial infarction: Principal | ICD-10-CM | POA: Diagnosis present

## 2024-06-03 DIAGNOSIS — F17210 Nicotine dependence, cigarettes, uncomplicated: Secondary | ICD-10-CM | POA: Diagnosis present

## 2024-06-03 DIAGNOSIS — Z6834 Body mass index (BMI) 34.0-34.9, adult: Secondary | ICD-10-CM | POA: Diagnosis not present

## 2024-06-03 DIAGNOSIS — I2510 Atherosclerotic heart disease of native coronary artery without angina pectoris: Secondary | ICD-10-CM | POA: Diagnosis not present

## 2024-06-03 DIAGNOSIS — I471 Supraventricular tachycardia, unspecified: Secondary | ICD-10-CM | POA: Diagnosis present

## 2024-06-03 DIAGNOSIS — B20 Human immunodeficiency virus [HIV] disease: Secondary | ICD-10-CM | POA: Diagnosis present

## 2024-06-03 DIAGNOSIS — F14129 Cocaine abuse with intoxication, unspecified: Secondary | ICD-10-CM | POA: Diagnosis present

## 2024-06-03 DIAGNOSIS — Z7982 Long term (current) use of aspirin: Secondary | ICD-10-CM | POA: Diagnosis not present

## 2024-06-03 DIAGNOSIS — Z7401 Bed confinement status: Secondary | ICD-10-CM | POA: Diagnosis not present

## 2024-06-03 DIAGNOSIS — Z882 Allergy status to sulfonamides status: Secondary | ICD-10-CM | POA: Diagnosis not present

## 2024-06-03 DIAGNOSIS — Z743 Need for continuous supervision: Secondary | ICD-10-CM | POA: Diagnosis not present

## 2024-06-03 DIAGNOSIS — Z800 Family history of malignant neoplasm of digestive organs: Secondary | ICD-10-CM | POA: Diagnosis not present

## 2024-06-03 LAB — ECHOCARDIOGRAM COMPLETE
AR max vel: 1.66 cm2
AV Area VTI: 1.93 cm2
AV Area mean vel: 1.84 cm2
AV Mean grad: 9 mmHg
AV Peak grad: 20.7 mmHg
Ao pk vel: 2.28 m/s
Area-P 1/2: 3.53 cm2
Calc EF: 58.2 %
Height: 64 in
MV M vel: 6.52 m/s
MV Peak grad: 170 mmHg
P 1/2 time: 434 ms
S' Lateral: 3.1 cm
Single Plane A2C EF: 59.2 %
Single Plane A4C EF: 58 %
Weight: 3200 [oz_av]

## 2024-06-03 LAB — LIPID PANEL
Cholesterol: 231 mg/dL — ABNORMAL HIGH (ref 0–200)
HDL: 47 mg/dL
LDL Cholesterol: 159 mg/dL — ABNORMAL HIGH (ref 0–99)
Total CHOL/HDL Ratio: 4.9 ratio
Triglycerides: 127 mg/dL
VLDL: 25 mg/dL (ref 0–40)

## 2024-06-03 LAB — HEPARIN LEVEL (UNFRACTIONATED)
Heparin Unfractionated: 0.35 [IU]/mL (ref 0.30–0.70)
Heparin Unfractionated: 0.14 [IU]/mL — ABNORMAL LOW (ref 0.30–0.70)
Heparin Unfractionated: 0.13 [IU]/mL — ABNORMAL LOW (ref 0.30–0.70)

## 2024-06-03 LAB — URINE DRUG SCREEN
Amphetamines: NEGATIVE
Barbiturates: NEGATIVE
Benzodiazepines: NEGATIVE
Cocaine: POSITIVE — AB
Fentanyl: POSITIVE — AB
Methadone Scn, Ur: NEGATIVE
Opiates: POSITIVE — AB
Tetrahydrocannabinol: POSITIVE — AB

## 2024-06-03 LAB — CBC
HCT: 34.8 % — ABNORMAL LOW (ref 36.0–46.0)
Hemoglobin: 12 g/dL (ref 12.0–15.0)
MCH: 33.7 pg (ref 26.0–34.0)
MCHC: 34.5 g/dL (ref 30.0–36.0)
MCV: 97.8 fL (ref 80.0–100.0)
Platelets: 242 K/uL (ref 150–400)
RBC: 3.56 MIL/uL — ABNORMAL LOW (ref 3.87–5.11)
RDW: 12.3 % (ref 11.5–15.5)
WBC: 8.9 K/uL (ref 4.0–10.5)
nRBC: 0 % (ref 0.0–0.2)

## 2024-06-03 LAB — TROPONIN T, HIGH SENSITIVITY
Troponin T High Sensitivity: 204 ng/L (ref 0–19)
Troponin T High Sensitivity: 614 ng/L (ref 0–19)
Troponin T High Sensitivity: 658 ng/L (ref 0–19)
Troponin T High Sensitivity: 890 ng/L (ref 0–19)
Troponin T High Sensitivity: 949 ng/L (ref 0–19)
Troponin T High Sensitivity: 1885 ng/L (ref 0–19)
Troponin T High Sensitivity: 2275 ng/L (ref 0–19)

## 2024-06-03 MED ORDER — ONDANSETRON HCL 4 MG/2ML IJ SOLN
4.0000 mg | Freq: Once | INTRAMUSCULAR | Status: AC
  Administered 2024-06-03: 4 mg via INTRAVENOUS
  Filled 2024-06-03: qty 2

## 2024-06-03 MED ORDER — LORAZEPAM 1 MG PO TABS
1.0000 mg | ORAL_TABLET | ORAL | Status: DC | PRN
  Administered 2024-06-03 – 2024-06-04 (×4): 1 mg via ORAL
  Filled 2024-06-03 (×4): qty 1

## 2024-06-03 MED ORDER — HYDROMORPHONE HCL 1 MG/ML IJ SOLN
1.0000 mg | Freq: Once | INTRAMUSCULAR | Status: AC
  Administered 2024-06-03: 1 mg via INTRAVENOUS
  Filled 2024-06-03: qty 1

## 2024-06-03 MED ORDER — BICTEGRAVIR-EMTRICITAB-TENOFOV 50-200-25 MG PO TABS
1.0000 | ORAL_TABLET | Freq: Every day | ORAL | Status: DC
  Administered 2024-06-04 – 2024-06-05 (×2): 1 via ORAL
  Filled 2024-06-03 (×4): qty 1

## 2024-06-03 MED ORDER — HEPARIN BOLUS VIA INFUSION
4000.0000 [IU] | Freq: Once | INTRAVENOUS | Status: AC
  Administered 2024-06-03: 4000 [IU] via INTRAVENOUS

## 2024-06-03 MED ORDER — ATORVASTATIN CALCIUM 40 MG PO TABS: 40.0000 mg | ORAL_TABLET | Freq: Every day | ORAL | Status: DC

## 2024-06-03 MED ORDER — PROCHLORPERAZINE EDISYLATE 10 MG/2ML IJ SOLN
10.0000 mg | INTRAMUSCULAR | Status: DC | PRN
  Administered 2024-06-03 (×2): 10 mg via INTRAVENOUS
  Filled 2024-06-03 (×2): qty 2

## 2024-06-03 MED ORDER — ONDANSETRON HCL 4 MG PO TABS: 4.0000 mg | ORAL_TABLET | Freq: Four times a day (QID) | ORAL | Status: DC | PRN

## 2024-06-03 MED ORDER — ASPIRIN 81 MG PO TBEC
81.0000 mg | DELAYED_RELEASE_TABLET | Freq: Every day | ORAL | Status: DC
  Administered 2024-06-03 – 2024-06-05 (×3): 81 mg via ORAL
  Filled 2024-06-03 (×3): qty 1

## 2024-06-03 MED ORDER — LISINOPRIL 10 MG PO TABS
10.0000 mg | ORAL_TABLET | Freq: Every day | ORAL | Status: DC
  Administered 2024-06-03 – 2024-06-05 (×3): 10 mg via ORAL
  Filled 2024-06-03 (×3): qty 1

## 2024-06-03 MED ORDER — MORPHINE SULFATE (PF) 2 MG/ML IV SOLN: 1.0000 mg | INTRAVENOUS | Status: DC | PRN

## 2024-06-03 MED ORDER — HYDROXYZINE HCL 25 MG PO TABS
50.0000 mg | ORAL_TABLET | Freq: Three times a day (TID) | ORAL | Status: DC | PRN
  Administered 2024-06-04: 50 mg via ORAL
  Filled 2024-06-03: qty 2

## 2024-06-03 MED ORDER — ACETAMINOPHEN 650 MG RE SUPP: 650.0000 mg | Freq: Four times a day (QID) | RECTAL | Status: DC | PRN

## 2024-06-03 MED ORDER — METOPROLOL TARTRATE 12.5 MG HALF TABLET
12.5000 mg | ORAL_TABLET | Freq: Two times a day (BID) | ORAL | Status: DC
  Administered 2024-06-03 – 2024-06-05 (×5): 12.5 mg via ORAL
  Filled 2024-06-03 (×5): qty 1

## 2024-06-03 MED ORDER — HEPARIN BOLUS VIA INFUSION
1500.0000 [IU] | Freq: Once | INTRAVENOUS | Status: AC
  Administered 2024-06-03: 1500 [IU] via INTRAVENOUS

## 2024-06-03 MED ORDER — MORPHINE SULFATE (PF) 2 MG/ML IV SOLN
2.0000 mg | INTRAVENOUS | Status: DC | PRN
  Administered 2024-06-03 (×2): 2 mg via INTRAVENOUS
  Filled 2024-06-03 (×2): qty 1

## 2024-06-03 MED ORDER — ATORVASTATIN CALCIUM 80 MG PO TABS
80.0000 mg | ORAL_TABLET | Freq: Every day | ORAL | Status: DC
  Administered 2024-06-04 – 2024-06-05 (×2): 80 mg via ORAL
  Filled 2024-06-03: qty 2
  Filled 2024-06-03 (×2): qty 1

## 2024-06-03 MED ORDER — HEPARIN (PORCINE) 25000 UT/250ML-% IV SOLN
1400.0000 [IU]/h | INTRAVENOUS | Status: DC
  Administered 2024-06-03: 900 [IU]/h via INTRAVENOUS
  Administered 2024-06-03: 1100 [IU]/h via INTRAVENOUS
  Filled 2024-06-03 (×2): qty 250

## 2024-06-03 MED ORDER — ONDANSETRON HCL 4 MG/2ML IJ SOLN
4.0000 mg | Freq: Four times a day (QID) | INTRAMUSCULAR | Status: DC | PRN
  Administered 2024-06-03 – 2024-06-04 (×2): 4 mg via INTRAVENOUS
  Filled 2024-06-03 (×2): qty 2

## 2024-06-03 MED ORDER — ACETAMINOPHEN 325 MG PO TABS: 650.0000 mg | ORAL_TABLET | Freq: Four times a day (QID) | ORAL | Status: DC | PRN

## 2024-06-03 NOTE — Assessment & Plan Note (Addendum)
 No signs acute alcohol withdrawal Patient no longer taking mood modifying agents.

## 2024-06-03 NOTE — Progress Notes (Signed)
 PHARMACY - ANTICOAGULATION CONSULT NOTE  Pharmacy Consult for heparin  Indication: chest pain/ACS  Allergies[1]  Patient Measurements: Height: 5' 4 (162.6 cm) Weight: 90.7 kg (200 lb) IBW/kg (Calculated) : 54.7 HEPARIN  DW (KG): 75.1  Vital Signs: Temp: 97.8 F (36.6 C) (12/20 2229) Temp Source: Oral (12/20 2229) BP: 160/98 (12/21 0130) Pulse Rate: 68 (12/21 0130)  Labs: Recent Labs    06/02/24 2238  HGB 12.7  HCT 37.2  PLT 249  CREATININE 0.76    Estimated Creatinine Clearance: 88.7 mL/min (by C-G formula based on SCr of 0.76 mg/dL).   Medical History: Past Medical History:  Diagnosis Date   Abnormal Pap smear    Had Cryo to treat   Allergy    Anemia    Anxiety    Cluster headaches    HIV infection (HCC)    Hypertension    Lung nodule    Vaginal venereal warts    Assessment: 30 yoF presented with chest pain. Pharmacy consulted to dose heparin  for ACS.  -CBC WNL -No PTA oral anticoagulation -Trop 86 > 204  Goal of Therapy:  Heparin  level 0.3-0.7 units/ml Monitor platelets by anticoagulation protocol: Yes   Plan:  Give 4000 units bolus x 1 Start heparin  infusion at 900 units/hr Check anti-Xa level in 6 hours and daily while on heparin  Continue to monitor H&H and platelets Follow up Cards consult  Lynwood Poplar, PharmD, BCPS Clinical Pharmacist 06/03/2024 1:59 AM        [1]  Allergies Allergen Reactions   Doxycycline Swelling   Other Swelling    Mulberry fruit    Sulfonamide Derivatives Other (See Comments)    Childhood allergy.

## 2024-06-03 NOTE — Assessment & Plan Note (Signed)
 Smoking cessation counseling.

## 2024-06-03 NOTE — ED Notes (Addendum)
 Date and time results received: 06/03/2024 0844 (use smartphrase .now to insert current time)  Test: troponin Critical Value: 658 read back and verified.   Name of Provider Notified: Johonson, C  Orders Received? Or Actions Taken?: Orders Received - See Orders for details

## 2024-06-03 NOTE — Progress Notes (Addendum)
 PHARMACY - ANTICOAGULATION CONSULT NOTE  Pharmacy Consult for heparin  Indication: chest pain/ACS  Allergies[1]  Patient Measurements: Height: 5' 4 (162.6 cm) Weight: 90.7 kg (200 lb) IBW/kg (Calculated) : 54.7 HEPARIN  DW (KG): 75.1  Vital Signs: Temp: 98.5 F (36.9 C) (12/21 1203) Temp Source: Oral (12/21 1203) BP: 169/92 (12/21 1430) Pulse Rate: 57 (12/21 1430)  Labs: Recent Labs    06/02/24 2238 06/03/24 0811 06/03/24 1353  HGB 12.7 12.0  --   HCT 37.2 34.8*  --   PLT 249 242  --   HEPARINUNFRC  --  0.35 0.14*  CREATININE 0.76  --   --     Estimated Creatinine Clearance: 88.7 mL/min (by C-G formula based on SCr of 0.76 mg/dL).   Medical History: Past Medical History:  Diagnosis Date   Abnormal Pap smear    Had Cryo to treat   Allergy    Anemia    Anxiety    Cluster headaches    HIV infection (HCC)    Hypertension    Lung nodule    Vaginal venereal warts    Assessment: 89 yoF presented with chest pain. Pharmacy consulted to dose heparin  for ACS.  HL 0.14- subtherapeutic -CBC WNL -No PTA oral anticoagulation -Trop 86 > 204>658  Goal of Therapy:  Heparin  level 0.3-0.7 units/ml Monitor platelets by anticoagulation protocol: Yes   Plan:  Rebolus 1500 units IV x 1 Increase heparin  infusion to 1100 units/hr Check anti-Xa level in 6 hours and daily Continue to monitor H&H and platelets   Elspeth Sour, PharmD Clinical Pharmacist 06/03/2024 2:46 PM         [1]  Allergies Allergen Reactions   Doxycycline Swelling   Other Swelling    Mulberry fruit    Sulfonamide Derivatives Other (See Comments)    Childhood allergy.

## 2024-06-03 NOTE — ED Notes (Signed)
 Pt placed back on cardiac monitoring Pt requesting something for anxiety Charge RN notified as Pt RN in with another pt

## 2024-06-03 NOTE — Progress Notes (Signed)
*  PRELIMINARY RESULTS* Echocardiogram 2D Echocardiogram has been performed.  Lori [name] 06/03/2024, 10:02 AM

## 2024-06-03 NOTE — Progress Notes (Signed)
 PHARMACY - ANTICOAGULATION CONSULT NOTE  Pharmacy Consult for heparin  Indication: chest pain/ACS  Allergies[1]  Patient Measurements: Height: 5' 4 (162.6 cm) Weight: 90.7 kg (200 lb) IBW/kg (Calculated) : 54.7 HEPARIN  DW (KG): 75.1  Vital Signs: Temp: 97.6 F (36.4 C) (12/21 0729) Temp Source: Oral (12/21 0729) BP: 162/100 (12/21 0915) Pulse Rate: 63 (12/21 0915)  Labs: Recent Labs    06/02/24 2238 06/03/24 0811  HGB 12.7 12.0  HCT 37.2 34.8*  PLT 249 242  HEPARINUNFRC  --  0.35  CREATININE 0.76  --     Estimated Creatinine Clearance: 88.7 mL/min (by C-G formula based on SCr of 0.76 mg/dL).   Medical History: Past Medical History:  Diagnosis Date   Abnormal Pap smear    Had Cryo to treat   Allergy    Anemia    Anxiety    Cluster headaches    HIV infection (HCC)    Hypertension    Lung nodule    Vaginal venereal warts    Assessment: 32 yoF presented with chest pain. Pharmacy consulted to dose heparin  for ACS.  HL 0.35- therapeutic -CBC WNL -No PTA oral anticoagulation -Trop 86 > 204>658  Goal of Therapy:  Heparin  level 0.3-0.7 units/ml Monitor platelets by anticoagulation protocol: Yes   Plan:  Continue heparin  infusion at 900 units/hr Check anti-Xa level in 6 hours and daily Continue to monitor H&H and platelets   Elspeth Sour, PharmD Clinical Pharmacist 06/03/2024 9:33 AM        [1]  Allergies Allergen Reactions   Doxycycline Swelling   Other Swelling    Mulberry fruit    Sulfonamide Derivatives Other (See Comments)    Childhood allergy.

## 2024-06-03 NOTE — Assessment & Plan Note (Signed)
 Positive cocaine on admission.

## 2024-06-03 NOTE — Assessment & Plan Note (Signed)
 Resume lisinopril  for blood pressure control Continue with nitroglycerin  drip.

## 2024-06-03 NOTE — Assessment & Plan Note (Signed)
 Continue antiretroviral therapy, she reports being compliant to her HIV treatment

## 2024-06-03 NOTE — ED Notes (Signed)
Care handoff given to receiving RN.

## 2024-06-03 NOTE — Progress Notes (Incomplete)
 PHARMACY - ANTICOAGULATION CONSULT NOTE  Pharmacy Consult for heparin  Indication: chest pain/ACS  Allergies[1]  Patient Measurements: Height: 5' 4 (162.6 cm) Weight: 84.4 kg (186 lb 1.1 oz) IBW/kg (Calculated) : 54.7 HEPARIN  DW (KG): 73.2  Vital Signs: Temp: 98.8 F (37.1 C) (12/21 1937) Temp Source: Oral (12/21 1937) BP: 147/87 (12/21 2200) Pulse Rate: 69 (12/21 2026)  Labs: Recent Labs    06/02/24 2238 06/03/24 0811 06/03/24 1353 06/03/24 2217  HGB 12.7 12.0  --   --   HCT 37.2 34.8*  --   --   PLT 249 242  --   --   HEPARINUNFRC  --  0.35 0.14* 0.13*  CREATININE 0.76  --   --   --     Estimated Creatinine Clearance: 85.5 mL/min (by C-G formula based on SCr of 0.76 mg/dL).   Medical History: Past Medical History:  Diagnosis Date   Abnormal Pap smear    Had Cryo to treat   Allergy    Anemia    Anxiety    Cluster headaches    HIV infection (HCC)    Hypertension    Lung nodule    Vaginal venereal warts    Assessment: 75 yoF presented with chest pain. Pharmacy consulted to dose heparin  for ACS.  -heparin  level 0.14 to 0.13- subtherapeutic on 1100 units/hr; no issues per RN -CBC WNL -No PTA oral anticoagulation -Trop 1800  Goal of Therapy:  Heparin  level 0.3-0.7 units/ml Monitor platelets by anticoagulation protocol: Yes   Plan:  Bolus 2000 units IV x 1 Increase heparin  infusion to 1300 units/hr Check anti-Xa level in 6 hours and daily Continue to monitor H&H and platelets   Lynwood Poplar, PharmD, BCPS Clinical Pharmacist 06/03/2024 11:21 PM       [1]  Allergies Allergen Reactions   Doxycycline Swelling   Other Swelling    Mulberry fruit    Sulfonamide Derivatives Other (See Comments)    Childhood allergy.

## 2024-06-03 NOTE — Hospital Course (Signed)
 53 y.o. female with medical history significant of HIV, hypertension, depression and obesity who presented with chest pain.    She has been at her usual state of health until late last night, when she suddenly felt right arm pain, moderate to severe in intensity, that occurred while she was sitting and watching her cell phone. After a few minutes the pain radiated into her chest, and became more severe and associated with nausea and diaphoresis. Pain was achy/ pressure in nature, persistent and worsening with no improving or worsening factors. After about 20 minutes of persistent chest pain, she called EMS.  She was found in pain and was transported to the ED.   She reports not taking blood pressure medications in a long time, positive tobacco abuse and having a sedentary life style.   She denies having chest pain while doing her activities of daily life, including going to the grocery store and pushing the cart.   Pt was admitted with NSTEMI to transfer to [facility] for cardiology consultation and started on IV nitroglycerin  and IV heparin .

## 2024-06-03 NOTE — ED Notes (Signed)
 Glasses left behind after pt transfer. Placed in labeled bag and left in pt belonging room (w/ psych lockers).

## 2024-06-03 NOTE — H&P (Addendum)
 " History and Physical    Patient: Lori [name] FMW:[id number] DOB: 1970-09-03 DOA: 06/02/2024 DOS: the patient was seen and examined on 06/03/2024 PCP: Eben Reyes BROCKS, MD  Patient coming from: Home  Chief Complaint:  Chief Complaint  Patient presents with   Chest Pain   HPI: Lori [name] is a 53 y.o. female with medical history significant of HIV, hypertension, depression and obesity who presented with chest pain.   She has been at her usual state of health until late last night, when she suddenly felt right arm pain, moderate to severe in intensity, that occurred while she was sitting and watching her cell phone. After a few minutes the pain radiated into her chest, and became more severe and associated with nausea and diaphoresis. Pain was achy/ pressure in nature, persistent and worsening with no improving or worsening factors. After about 20 minutes of persistent chest pain, she called EMS. She was found in pain and was transported to the ED.  She reports not taking blood pressure medications in a long time, positive tobacco abuse and having a sedentary life style.  She denies having chest pain while doing her activities of daily life, including going to the grocery store and pushing the cart.   At the time of my examination, while on nitroglycerin  drip, her pain has improved, but continues to have nausea.     Review of Systems: As mentioned in the history of present illness. All other systems reviewed and are negative. Past Medical History:  Diagnosis Date   Abnormal Pap smear    Had Cryo to treat   Allergy    Anemia    Anxiety    Cluster headaches    HIV infection (HCC)    Hypertension    Lung nodule    Vaginal venereal warts    Past Surgical History:  Procedure Laterality Date   ABDOMINAL HYSTERECTOMY     KNEE ARTHROSCOPY Left    bakers cyst removal   TUBAL LIGATION     Social History:  reports that she has been smoking cigarettes. She started smoking  about 41 years ago. She has a 68 pack-year smoking history. She has never used smokeless tobacco. She reports current alcohol use. She reports current drug use. Frequency: 7.00 times per week. Drugs: Marijuana and Crack cocaine.  Allergies[1]  Family History  Problem Relation Age of Onset   CAD Mother        MI 2016   Uterine cancer Mother    Cervical cancer Mother    Colon cancer Mother    Lung cancer Maternal Grandmother    Cancer Maternal Grandfather        larynx   Prostate cancer Maternal Grandfather    Prostate cancer Father    Breast cancer Neg Hx    Liver disease Neg Hx    Esophageal cancer Neg Hx    Stomach cancer Neg Hx    Colon polyps Neg Hx     Prior to Admission medications  Medication Sig Start Date End Date Taking? Authorizing Provider  bictegravir-emtricitabine -tenofovir  AF (BIKTARVY ) 50-200-25 MG TABS tablet Take 1 tablet by mouth daily. 04/16/24   Eben Reyes BROCKS, MD  hydrOXYzine  (ATARAX ) 50 MG tablet Take 1 tablet (50 mg) by mouth 3 times daily as needed for anxiety. 01/20/23   Raliegh Marsa RAMAN, DO  lisinopril  (ZESTRIL ) 10 MG tablet Take 1 tablet (10 mg) by mouth daily. 01/20/23   Raliegh Marsa RAMAN, DO  lumateperone  tosylate (CAPLYTA ) 42 MG  capsule Take 1 capsule (42 mg) by mouth at bedtime. 01/20/23   Pashayan, Marsa RAMAN, DO  nicotine  (NICODERM CQ  - DOSED IN MG/24 HOURS) 14 mg/24hr patch Place 1 patch (14 mg total) onto the skin daily at 6 (six) AM. 01/21/23   Massengill, Rankin, MD  traZODone  (DESYREL ) 50 MG tablet Take 1 tablet (50 mg) by mouth at bedtime as needed for sleep. 01/20/23 02/19/23  Raliegh Marsa RAMAN, DO  diphenhydramine -acetaminophen  (TYLENOL  PM) 25-500 MG TABS tablet Take 1 tablet by mouth at bedtime as needed (sleep).  08/23/19  [provider]    Physical Exam: Vitals:   06/03/24 0205 06/03/24 0210 06/03/24 0212 06/03/24 0215  BP: (!) 146/100 (!) 141/101 (!) 137/96 (!) 144/89  Pulse: 74 72 79 71  Resp: 14 13 13  (!) 21  Temp:       TempSrc:      SpO2: 94% 93% 94% 94%  Weight:      Height:       BP (!) 141/100   Pulse 84   Temp 97.8 F (36.6 C) (Oral)   Resp 19   Ht 5' 4 (1.626 m)   Wt 90.7 kg   SpO2 93%   BMI 34.33 kg/m   Neurology awake and alert ENT with mild pallor Cardiovascular with S1 and S2 present and regular with no gallops, rubs or murmurs Respiratory with no rales or wheezing, no rhonchi  Abdomen protuberant, soft and non tender No  lower extremity edema   Data Reviewed:   Na 140, K 3,6 Cl 105 bicarbonate 26 glucose 134 bun 16 cr 0,76  Mg 2.1  High sensitive troponin 86 and 294  Wbc 8,2 hgb 12.7 plt 249  Toxicology positive for opiates, cocaine and tetrahydrocannabinol   Chest radiograph with hypoinflation, with prominent aortic notch, no effusions or infiltrates.  EKG 75 bpm, normal axis, normal intervals, qtc 460, sinus rhythm with ST depression lead II, III, aVF and V3 to V5, no significant T wave changes.   Assessment and Plan: * NSTEMI (non-ST elevated myocardial infarction) (HCC) EKG with new inferolateral ST segment depressions and rising high sensitive troponin Patient continue to have nausea, but not chest pain while on nitroglycerin .  Positive for cocaine on toxicology screen, acute cocaine intoxication.   Plan to continue IV heparin  for anticoagulation.  Continue nitroglycerin  drip Aspirin  and statin.  Check echocardiogram, and continue telemetry monitoring  Hold on B blocker due to cocaine intoxication, add as needed benzodiazepine   Add as needed analgesics and antiemetics.  Plan for transfer to [facility] for further invasive cardiac evaluation  Hypertension Resume lisinopril  for blood pressure control Continue with nitroglycerin  drip.   Human immunodeficiency virus (HIV) disease (HCC) Continue antiretroviral therapy, she reports being compliant to her HIV treatment   Alcohol abuse with alcohol-induced mood disorder (HCC) No signs acute alcohol  withdrawal Patient no longer taking mood modifying agents.    TOBACCO ABUSE Smoking cessation counseling   Cocaine abuse (HCC) Positive cocaine on admission.   Obesity, class 1 Calculated BMI is 34.3       Advance Care Planning:   Code Status: Full Code   Consults: Cardiology   Family Communication: no family at the bedside   Severity of Illness: The appropriate patient status for this patient is INPATIENT. Inpatient status is judged to be reasonable and necessary in order to provide the required intensity of service to ensure the patient's safety. The patient's presenting symptoms, physical exam findings, and initial radiographic and laboratory data  in the context of their chronic comorbidities is felt to place them at high risk for further clinical deterioration. Furthermore, it is not anticipated that the patient will be medically stable for discharge from the hospital within 2 midnights of admission.   * I certify that at the point of admission it is my clinical judgment that the patient will require inpatient hospital care spanning beyond 2 midnights from the point of admission due to high intensity of service, high risk for further deterioration and high frequency of surveillance required.*  Author: Elidia Toribio Furnace, MD 06/03/2024 2:28 AM  For on call review [email or web address].      [1]  Allergies Allergen Reactions   Doxycycline Swelling   Other Swelling    Mulberry fruit    Sulfonamide Derivatives Other (See Comments)    Childhood allergy.   "

## 2024-06-03 NOTE — Progress Notes (Addendum)
 " PROGRESS NOTE   Lori [name]  FMW:[id number] DOB: 02/15/1971 DOA: 06/02/2024 PCP: Lori Reyes BROCKS, MD   Chief Complaint  Patient presents with   Chest Pain   Level of care: Telemetry  Brief Admission History:  53 y.o. female with medical history significant of HIV, hypertension, depression and obesity who presented with chest pain.    She has been at her usual state of health until late last night, when she suddenly felt right arm pain, moderate to severe in intensity, that occurred while she was sitting and watching her cell phone. After a few minutes the pain radiated into her chest, and became more severe and associated with nausea and diaphoresis. Pain was achy/ pressure in nature, persistent and worsening with no improving or worsening factors. After about 20 minutes of persistent chest pain, she called EMS.  She was found in pain and was transported to the ED.   She reports not taking blood pressure medications in a long time, positive tobacco abuse and having a sedentary life style.   She denies having chest pain while doing her activities of daily life, including going to the grocery store and pushing the cart.   Pt was admitted with NSTEMI to transfer to [facility] for cardiology consultation and started on IV nitroglycerin  and IV heparin .     Assessment and Plan:  NSTEMI EKG with new inferolateral ST segment depressions and rising high sensitive troponin Patient continue to have nausea, but not chest pain while on nitroglycerin .  Positive for cocaine on toxicology screen, acute cocaine intoxication.  HS troponins rising on medical therapies, continue to follow until it peaks   Continue IV heparin  for full dose anticoagulation.  Continue nitroglycerin  drip for chest pain relief  Aspirin  and high intensity statin  Follow up echocardiogram, and continue telemetry monitoring  as needed benzodiazepine   Add as needed analgesics and antiemetics Given pt demonstrates no evidence of  acute cocaine intoxication and already on vasodilation (IV nitroglycerin ) and persistently elevated BPs will start low dose beta-blocker as I feel the cardioprotective benefits outweigh risks Pt awaiting transfer to [facility] for cardiology consultation and further invasive cardiac evaluation  Obesity, class 1 Calculated BMI is 34.3   Alcohol abuse with alcohol-induced mood disorder  No signs acute alcohol withdrawal Patient no longer taking mood modifying agents.    Cocaine abuse  Positive cocaine on admission but NOT appears to be acutely intoxicated.   Hypertension Resume lisinopril  for blood pressure control Continue with nitroglycerin  drip.   TOBACCO ABUSE Smoking cessation counseling   Human immunodeficiency virus (HIV) disease  Continue antiretroviral therapy, she reports being compliant to her HIV treatment    DVT prophylaxis: IV heparin  infusion  Code Status: Full  Family Communication:  Disposition:    Consultants:  Cardiology  Procedures:   Antimicrobials:    Subjective: Pt reporting that chest pain is resolved after being on IV nitroglycerin  and she reports she wants to eat and drink.   Objective: Vitals:   06/03/24 0830 06/03/24 0845 06/03/24 0900 06/03/24 0915  BP: (!) 162/104 (!) 160/111 (!) 171/111 (!) 162/100  Pulse: (!) 59 79 60 63  Resp: 10 18    Temp:      TempSrc:      SpO2: 97% 92% 94% 97%  Weight:      Height:        Intake/Output Summary (Last 24 hours) at 06/03/2024 0925 Last data filed at 06/03/2024 0203 Gross per 24 hour  Intake 72.76 ml  Output --  Net 72.76 ml   Filed Weights   06/02/24 2231  Weight: 90.7 kg   Examination:  General exam: Appears calm and comfortable  Respiratory system: Clear to auscultation. Respiratory effort normal. Cardiovascular system: normal S1 & S2 heard. No JVD, murmurs, rubs, gallops or clicks. No pedal edema. Gastrointestinal system: Abdomen is nondistended, soft and nontender. No organomegaly or  masses felt. Normal bowel sounds heard. Central nervous system: Alert and oriented. No focal neurological deficits. Extremities: Symmetric 5 x 5 power. Skin: No rashes, lesions or ulcers. Psychiatry: Judgement and insight appear normal. Mood & affect appropriate.   Data Reviewed: I have personally reviewed following labs and imaging studies  CBC: Recent Labs  Lab 06/02/24 2238 06/03/24 0811  WBC 8.2 8.9  HGB 12.7 12.0  HCT 37.2 34.8*  MCV 98.4 97.8  PLT 249 242    Basic Metabolic Panel: Recent Labs  Lab 06/02/24 2238  NA 140  K 3.6  CL 105  CO2 26  GLUCOSE 134*  BUN 16  CREATININE 0.76  CALCIUM  8.5*  MG 2.1    CBG: No results for input(s): GLUCAP in the last 168 hours.  No results found for this or any previous visit (from the past 240 hours).   Radiology Studies: DG Chest Port 1 View Result Date: 06/02/2024 EXAM: 1 VIEW(S) XRAY OF THE CHEST 06/02/2024 10:44:07 PM COMPARISON: 09/11/2017 CLINICAL HISTORY: cp FINDINGS: LUNGS AND PLEURA: No focal pulmonary opacity. No pleural effusion. No pneumothorax. HEART AND MEDIASTINUM: No acute abnormality of the cardiomediastinal silhouette. BONES AND SOFT TISSUES: No acute osseous abnormality. IMPRESSION: 1. No acute cardiopulmonary process. Electronically signed by: Norman Gatlin MD 06/02/2024 10:48 PM EST RP Workstation: [id number]    Scheduled Meds:  aspirin  EC  81 mg Oral Daily   atorvastatin   40 mg Oral Daily   bictegravir-emtricitabine -tenofovir  AF  1 tablet Oral Daily   lisinopril   10 mg Oral Daily   Continuous Infusions:  heparin  900 Units/hr (06/03/24 0206)   nitroGLYCERIN  200 mcg/min (06/03/24 0528)     LOS: 0 days   Time spent: 54 mins  Naavya Postma Vicci, MD How to contact the [facility] Attending or Consulting provider 7A - 7P or covering provider during after hours 7P -7A, for this patient?  Check the care team in [facility] and look for a) attending/consulting TRH provider listed and b) the TRH team listed Log  into [email or web address] to find provider on call.  Locate the TRH provider you are looking for under [facility] and page to a number that you can be directly reached. If you still have difficulty reaching the provider, please page the [facility] (Director on Call) for the Hospitalists listed on amion for assistance.  06/03/2024, 9:25 AM    "

## 2024-06-03 NOTE — ED Notes (Signed)
 Critical troponin of 658 given to DR Vicci  and primary provider Adina Bars, paramedic.

## 2024-06-03 NOTE — Assessment & Plan Note (Addendum)
 EKG with new inferolateral ST segment depressions and rising high sensitive troponin Patient continue to have nausea, but not chest pain while on nitroglycerin .  Positive for cocaine on toxicology screen, acute cocaine intoxication.   Plan to continue IV heparin  for anticoagulation.  Continue nitroglycerin  drip Aspirin  and statin.  Check echocardiogram, and continue telemetry monitoring  Hold on B blocker due to cocaine intoxication, add as needed benzodiazepine   Add as needed analgesics and antiemetics.  Plan for transfer to [facility] for further invasive cardiac evaluation

## 2024-06-03 NOTE — Progress Notes (Signed)
" °  Transition of Care [facility]) Screening Note   Patient Details  Name: Lori [name] Date of Birth: [birth year]   Transition of Care [facility]) CM/SW Contact:    Hoy DELENA Bigness, LCSW Phone Number: 06/03/2024, 9:45 AM    Transition of Care Department [facility]) has reviewed patient and no TOC needs have been identified at this time. We will continue to monitor patient advancement through interdisciplinary progression rounds. If new patient transition needs arise, please place a TOC consult.    06/03/24 0945  TOC Brief Assessment  Insurance and Status Reviewed  Patient has primary care physician Yes  Home environment has been reviewed From home  Prior level of function: Independent  Prior/Current Home Services No current home services  Social Drivers of Health Review SDOH reviewed no interventions necessary  Readmission risk has been reviewed Yes  Transition of care needs no transition of care needs at this time    "

## 2024-06-03 NOTE — Assessment & Plan Note (Signed)
 Calculated BMI is 34.3

## 2024-06-04 ENCOUNTER — Encounter (HOSPITAL_COMMUNITY): Payer: Self-pay | Admitting: Cardiovascular Disease

## 2024-06-04 ENCOUNTER — Encounter (HOSPITAL_COMMUNITY): Admission: EM | Disposition: A | Payer: Self-pay | Source: Home / Self Care | Attending: Family Medicine

## 2024-06-04 DIAGNOSIS — I214 Non-ST elevation (NSTEMI) myocardial infarction: Secondary | ICD-10-CM | POA: Diagnosis not present

## 2024-06-04 DIAGNOSIS — E66811 Obesity, class 1: Secondary | ICD-10-CM | POA: Diagnosis not present

## 2024-06-04 DIAGNOSIS — I10 Essential (primary) hypertension: Secondary | ICD-10-CM | POA: Diagnosis not present

## 2024-06-04 DIAGNOSIS — F1014 Alcohol abuse with alcohol-induced mood disorder: Secondary | ICD-10-CM | POA: Diagnosis not present

## 2024-06-04 DIAGNOSIS — F1410 Cocaine abuse, uncomplicated: Secondary | ICD-10-CM | POA: Diagnosis not present

## 2024-06-04 DIAGNOSIS — I2510 Atherosclerotic heart disease of native coronary artery without angina pectoris: Secondary | ICD-10-CM

## 2024-06-04 DIAGNOSIS — B20 Human immunodeficiency virus [HIV] disease: Secondary | ICD-10-CM | POA: Diagnosis not present

## 2024-06-04 HISTORY — PX: LEFT HEART CATH AND CORONARY ANGIOGRAPHY: CATH118249

## 2024-06-04 LAB — BASIC METABOLIC PANEL WITH GFR
Anion gap: 10 (ref 5–15)
BUN: 6 mg/dL (ref 6–20)
CO2: 29 mmol/L (ref 22–32)
Calcium: 9.1 mg/dL (ref 8.9–10.3)
Chloride: 101 mmol/L (ref 98–111)
Creatinine, Ser: 0.6 mg/dL (ref 0.44–1.00)
GFR, Estimated: >60 mL/min
Glucose, Bld: 124 mg/dL — ABNORMAL HIGH (ref 70–99)
Potassium: 3.5 mmol/L (ref 3.5–5.1)
Sodium: 140 mmol/L (ref 135–145)

## 2024-06-04 LAB — CBC
HCT: 33.7 % — ABNORMAL LOW (ref 36.0–46.0)
Hemoglobin: 12 g/dL (ref 12.0–15.0)
MCH: 33.9 pg (ref 26.0–34.0)
MCHC: 35.6 g/dL (ref 30.0–36.0)
MCV: 95.2 fL (ref 80.0–100.0)
Platelets: 217 K/uL (ref 150–400)
RBC: 3.54 MIL/uL — ABNORMAL LOW (ref 3.87–5.11)
RDW: 12.1 % (ref 11.5–15.5)
WBC: 9.9 K/uL (ref 4.0–10.5)
nRBC: 0 % (ref 0.0–0.2)

## 2024-06-04 LAB — HEPARIN LEVEL (UNFRACTIONATED): Heparin Unfractionated: 0.17 [IU]/mL — ABNORMAL LOW (ref 0.30–0.70)

## 2024-06-04 LAB — HEMOGLOBIN A1C
Hgb A1c MFr Bld: 5.1 % (ref 4.8–5.6)
Mean Plasma Glucose: 99.67 mg/dL

## 2024-06-04 SURGERY — LEFT HEART CATH AND CORONARY ANGIOGRAPHY
Anesthesia: LOCAL

## 2024-06-04 MED ORDER — LORAZEPAM 1 MG PO TABS
1.0000 mg | ORAL_TABLET | ORAL | Status: DC | PRN
  Administered 2024-06-05: 2 mg via ORAL
  Filled 2024-06-04: qty 2

## 2024-06-04 MED ORDER — FENTANYL CITRATE (PF) 100 MCG/2ML IJ SOLN
INTRAMUSCULAR | Status: DC | PRN
  Administered 2024-06-04: 25 ug via INTRAVENOUS

## 2024-06-04 MED ORDER — FENTANYL CITRATE (PF) 100 MCG/2ML IJ SOLN
INTRAMUSCULAR | Status: AC
  Filled 2024-06-04: qty 2

## 2024-06-04 MED ORDER — HYDRALAZINE HCL 20 MG/ML IJ SOLN: 10.0000 mg | INTRAMUSCULAR | Status: AC | PRN

## 2024-06-04 MED ORDER — MIDAZOLAM HCL (PF) 2 MG/2ML IJ SOLN
INTRAMUSCULAR | Status: DC | PRN
  Administered 2024-06-04: 1 mg via INTRAVENOUS

## 2024-06-04 MED ORDER — FREE WATER
500.0000 mL | Freq: Once | Status: AC
  Administered 2024-06-04: 500 mL via ORAL

## 2024-06-04 MED ORDER — VERAPAMIL HCL 2.5 MG/ML IV SOLN
INTRAVENOUS | Status: AC
  Filled 2024-06-04: qty 2

## 2024-06-04 MED ORDER — LIDOCAINE HCL (PF) 1 % IJ SOLN
INTRAMUSCULAR | Status: AC
  Filled 2024-06-04: qty 30

## 2024-06-04 MED ORDER — MIDAZOLAM HCL 2 MG/2ML IJ SOLN
INTRAMUSCULAR | Status: AC
  Filled 2024-06-04: qty 2

## 2024-06-04 MED ORDER — HEPARIN (PORCINE) IN NACL 1000-0.9 UT/500ML-% IV SOLN
INTRAVENOUS | Status: DC | PRN
  Administered 2024-06-04 (×2): 500 mL

## 2024-06-04 MED ORDER — HYDROXYZINE HCL 25 MG PO TABS: 50.0000 mg | ORAL_TABLET | Freq: Three times a day (TID) | ORAL | Status: DC | PRN

## 2024-06-04 MED ORDER — LIDOCAINE HCL (PF) 1 % IJ SOLN
INTRAMUSCULAR | Status: DC | PRN
  Administered 2024-06-04: 2 mL

## 2024-06-04 MED ORDER — LABETALOL HCL 5 MG/ML IV SOLN: 10.0000 mg | INTRAVENOUS | Status: AC | PRN

## 2024-06-04 MED ORDER — VERAPAMIL HCL 2.5 MG/ML IV SOLN
INTRAVENOUS | Status: DC | PRN
  Administered 2024-06-04: 10 mL via INTRA_ARTERIAL

## 2024-06-04 MED ORDER — SODIUM CHLORIDE 0.9% FLUSH: 3.0000 mL | INTRAVENOUS | Status: DC | PRN

## 2024-06-04 MED ORDER — METOPROLOL TARTRATE 5 MG/5ML IV SOLN
5.0000 mg | Freq: Once | INTRAVENOUS | Status: AC
  Administered 2024-06-04: 5 mg via INTRAVENOUS
  Filled 2024-06-04: qty 5

## 2024-06-04 MED ORDER — HEPARIN SODIUM (PORCINE) 1000 UNIT/ML IJ SOLN
INTRAMUSCULAR | Status: AC
  Filled 2024-06-04: qty 10

## 2024-06-04 MED ORDER — CLOPIDOGREL BISULFATE 75 MG PO TABS
75.0000 mg | ORAL_TABLET | Freq: Every day | ORAL | Status: DC
  Administered 2024-06-05: 75 mg via ORAL
  Filled 2024-06-04: qty 1

## 2024-06-04 MED ORDER — ASPIRIN 81 MG PO CHEW: 81.0000 mg | CHEWABLE_TABLET | ORAL | Status: DC

## 2024-06-04 MED ORDER — SODIUM CHLORIDE 0.9 % IV SOLN: 250.0000 mL | INTRAVENOUS | Status: DC | PRN

## 2024-06-04 MED ORDER — SODIUM CHLORIDE 0.9% FLUSH
3.0000 mL | Freq: Two times a day (BID) | INTRAVENOUS | Status: DC
  Administered 2024-06-05: 3 mL via INTRAVENOUS

## 2024-06-04 MED ORDER — CLOPIDOGREL BISULFATE 75 MG PO TABS
300.0000 mg | ORAL_TABLET | Freq: Once | ORAL | Status: AC
  Administered 2024-06-04: 300 mg via ORAL
  Filled 2024-06-04: qty 4

## 2024-06-04 MED ORDER — POTASSIUM CHLORIDE CRYS ER 20 MEQ PO TBCR
40.0000 meq | EXTENDED_RELEASE_TABLET | Freq: Once | ORAL | Status: AC
  Administered 2024-06-04: 40 meq via ORAL
  Filled 2024-06-04: qty 2

## 2024-06-04 MED ORDER — NICOTINE 21 MG/24HR TD PT24
21.0000 mg | MEDICATED_PATCH | Freq: Every day | TRANSDERMAL | Status: DC
  Administered 2024-06-04 – 2024-06-05 (×2): 21 mg via TRANSDERMAL
  Filled 2024-06-04 (×2): qty 1

## 2024-06-04 MED ORDER — HEPARIN SODIUM (PORCINE) 1000 UNIT/ML IJ SOLN
INTRAMUSCULAR | Status: DC | PRN
  Administered 2024-06-04: 4000 [IU] via INTRAVENOUS

## 2024-06-04 MED ORDER — IOHEXOL 350 MG/ML SOLN
INTRAVENOUS | Status: DC | PRN
  Administered 2024-06-04: 45 mL via INTRA_ARTERIAL

## 2024-06-04 SURGICAL SUPPLY — 6 items
CATH 5FR JL3.5 JR4 ANG PIG MP (CATHETERS) IMPLANT
DEVICE RAD COMP TR BAND LRG (VASCULAR PRODUCTS) IMPLANT
GLIDESHEATH SLEND SS 6F .021 (SHEATH) IMPLANT
GUIDEWIRE INQWIRE 1.5J.035X260 (WIRE) IMPLANT
PACK CARDIAC CATHETERIZATION (CUSTOM PROCEDURE TRAY) ×1 IMPLANT
SET ATX-X65L (MISCELLANEOUS) IMPLANT

## 2024-06-04 NOTE — Significant Event (Signed)
 Pt Experiencing HR 150s with Jaw pain at 1630  Paged Dr. Verlin to make aware, Gave 1 mg of po ativan  fro Anxiety, MD Ordered 1 time Lopressor  IV. HR now in 110s with resolved Jaw pain rates,  Rates 0 on scale 1-10

## 2024-06-04 NOTE — TOC CM/SW Note (Signed)
 Transition of Care [facility]) - Inpatient Brief Assessment   Patient Details  Name: Lori [name] MRN: [id number] Date of Birth: [birth year]  Transition of Care [facility]) CM/SW Contact:    Waddell Barnie Rama, RN Phone Number: 06/04/2024, 4:13 PM   Clinical Narrative: From home with spouse, has PCP and insurance on file, states has no HH services in place at this time or DME at home.  States family member will transport them home at [employer] and family is support system, states gets medications from [facility].  Pta self ambulatory.   There are no ICM  needs identified  at this time.  Please place consult for ICM  needs.     Transition of Care Asessment: Insurance and Status: Insurance coverage has been reviewed Patient has primary care physician: Yes Home environment has been reviewed: home with spouse Prior level of function:: indep Prior/Current Home Services: No current home services Social Drivers of Health Review: SDOH reviewed no interventions necessary Readmission risk has been reviewed: Yes Transition of care needs: no transition of care needs at this time

## 2024-06-04 NOTE — Progress Notes (Signed)
 PHARMACY - ANTICOAGULATION CONSULT NOTE  Pharmacy Consult for heparin  Indication: chest pain/ACS  Allergies[1]  Patient Measurements: Height: 5' 4 (162.6 cm) Weight: 84.4 kg (186 lb 1.1 oz) IBW/kg (Calculated) : 54.7 HEPARIN  DW (KG): 73.2  Vital Signs: Temp: 98.4 F (36.9 C) (12/22 0748) Temp Source: Oral (12/22 0748) BP: 136/80 (12/22 0748) Pulse Rate: 93 (12/22 0748)  Labs: Recent Labs    06/02/24 2238 06/02/24 2238 06/03/24 0811 06/03/24 1353 06/03/24 2217 06/04/24 0737  HGB 12.7  --  12.0  --   --  12.0  HCT 37.2  --  34.8*  --   --  33.7*  PLT 249  --  242  --   --  217  HEPARINUNFRC  --    < > 0.35 0.14* 0.13* 0.17*  CREATININE 0.76  --   --   --   --   --    < > = values in this interval not displayed.    Estimated Creatinine Clearance: 85.5 mL/min (by C-G formula based on SCr of 0.76 mg/dL).   Medical History: Past Medical History:  Diagnosis Date   Abnormal Pap smear    Had Cryo to treat   Allergy    Anemia    Anxiety    Cluster headaches    HIV infection (HCC)    Hypertension    Lung nodule    Vaginal venereal warts    Assessment: Lori [name] presented with chest pain. Pharmacy consulted to dose heparin  for ACS.  No PTA oral anticoagulation.  HL 0.17 remains subtherapeutic on 1200 units/hr.  CBC stable, hs-cTn trended up to 2,275 yesterday.  For LHC today.  Goal of Therapy:  Heparin  level 0.3-0.7 units/ml Monitor platelets by anticoagulation protocol: Yes   Plan:  Increase heparin  IV 1400 units/h, no bolus with LHC today F/u after LHC  Maurilio Fila, PharmD Clinical Pharmacist 06/04/2024  9:24 AM     [1]  Allergies Allergen Reactions   Doxycycline Swelling   Other Swelling    Mulberry fruit    Sulfonamide Derivatives Other (See Comments)    Childhood allergy.

## 2024-06-04 NOTE — Progress Notes (Signed)
 "   [facility]    Date of Admission:  06/02/2024     ID: Lori [name] is a 53 y.o. female with   Principal Problem:   NSTEMI (non-ST elevated myocardial infarction) (HCC) Active Problems:   Human immunodeficiency virus (HIV) disease (HCC)   TOBACCO ABUSE   Hypertension   Cocaine abuse (HCC)   Alcohol abuse with alcohol-induced mood disorder (HCC)   Obesity, class 1    Subjective: 53 yo F with hiv whom I have known for many years. She uses cocaine and despite this is very adherent to her HIV therapy (biktarvy ).  She describes pain starting in her R arm which then radiated into her chest.  She called EMS and was brought to the ED. She was started on a ntg drip, heparin , ASA, Morphine . Her trop 86--> 294-->> 2,275. She was found to have st depressions on her ECG. TTE- LVEF 55-60%, no wall motion abn.  She has been off her anti-htn rx.  She is currently pain free, NPO.    Medications:   aspirin  EC  81 mg Oral Daily   atorvastatin   80 mg Oral Daily   bictegravir-emtricitabine -tenofovir  AF  1 tablet Oral Daily   lisinopril   10 mg Oral Daily   metoprolol  tartrate  12.5 mg Oral BID    Objective: Vital signs in last 24 hours: Temp:  [98.4 F (36.9 C)-98.9 F (37.2 C)] 98.4 F (36.9 C) (12/22 0748) Pulse Rate:  [54-93] 93 (12/22 0748) Resp:  [10-24] 20 (12/22 0748) BP: (119-195)/(76-111) 136/80 (12/22 0748) SpO2:  [91 %-99 %] 99 % (12/22 0748) Weight:  [84.4 kg] 84.4 kg (12/21 1708)   General appearance: alert, cooperative, and no distress Resp: clear to auscultation bilaterally Cardio: regular rate and rhythm GI: normal findings: bowel sounds normal and soft, non-tender Extremities: edema none  Lab Results Recent Labs    06/02/24 2238 06/03/24 0811 06/04/24 0737  WBC 8.2 8.9 9.9  HGB 12.7 12.0 12.0  HCT 37.2 34.8* 33.7*  NA 140  --  140  K 3.6  --  3.5  CL 105  --  101  CO2 26  --  29  BUN 16  --  6  CREATININE 0.76  --  0.60    Liver Panel No results for input(s): PROT, ALBUMIN, AST, ALT, ALKPHOS, BILITOT, BILIDIR, IBILI in the last 72 hours. Sedimentation Rate No results for input(s): ESRSEDRATE in the last 72 hours. C-Reactive Protein No results for input(s): CRP in the last 72 hours.  Microbiology:  Studies/Results: ECHOCARDIOGRAM COMPLETE Result Date: 06/03/2024    ECHOCARDIOGRAM REPORT   Patient Name:   Lori [name] Date of Exam: 06/03/2024 Medical Rec #:  [id number]           Height:       64.0 in Accession #:    [id number]          Weight:       200.0 lb Date of Birth:  03-14-1971            BSA:          1.956 m Patient Age:    53 years            BP:           151/95 mmHg Patient Gender: F                   HR:  78 bpm. Exam Location:  [facility] Procedure: 2D Echo, Cardiac Doppler and Color Doppler (Both Spectral and Color            Flow Doppler were utilized during procedure). Indications:    Chest Pain R07.9  History:        Patient has no prior history of Echocardiogram examinations. CAD                 and Previous Myocardial Infarction; Risk Factors:Hypertension                 and Current Smoker. Alcohol abuse with alcohol-induced mood                 disorder (HCC). Cocaine abuse (HCC). Cannabis use disorder.  Sonographer:    Aida Pizza RCS Referring Phys: [id number] MAURICIO DANIEL ARRIEN IMPRESSIONS  1. Left ventricular ejection fraction, by estimation, is 55 to 60%. The left ventricle has normal function. The left ventricle has no regional wall motion abnormalities. There is mild concentric left ventricular hypertrophy. Left ventricular diastolic parameters are consistent with Grade I diastolic dysfunction (impaired relaxation).  2. Right ventricular systolic function is normal. The right ventricular size is normal.  3. Left atrial size was moderately dilated.  4. The mitral valve is normal in structure. Mild to moderate mitral valve regurgitation. No evidence of mitral  stenosis.  5. The aortic valve is normal in structure. Aortic valve regurgitation is mild to moderate. Aortic valve sclerosis/calcification is present, without any evidence of aortic stenosis. Aortic regurgitation PHT measures 434 msec.  6. There is borderline dilatation of the aortic root, measuring 36 mm.  7. The inferior vena cava is normal in size with greater than 50% respiratory variability, suggesting right atrial pressure of 3 mmHg. FINDINGS  Left Ventricle: Left ventricular ejection fraction, by estimation, is 55 to 60%. The left ventricle has normal function. The left ventricle has no regional wall motion abnormalities. The left ventricular internal cavity size was normal in size. There is  mild concentric left ventricular hypertrophy. Left ventricular diastolic parameters are consistent with Grade I diastolic dysfunction (impaired relaxation). Right Ventricle: The right ventricular size is normal. No increase in right ventricular wall thickness. Right ventricular systolic function is normal. Left Atrium: Left atrial size was moderately dilated. Right Atrium: Right atrial size was normal in size. Pericardium: There is no evidence of pericardial effusion. Mitral Valve: The mitral valve is normal in structure. Mild to moderate mitral valve regurgitation. No evidence of mitral valve stenosis. Tricuspid Valve: The tricuspid valve is normal in structure. Tricuspid valve regurgitation is not demonstrated. No evidence of tricuspid stenosis. Aortic Valve: The aortic valve is normal in structure. Aortic valve regurgitation is mild to moderate. Aortic regurgitation PHT measures 434 msec. Aortic valve sclerosis/calcification is present, without any evidence of aortic stenosis. Aortic valve mean  gradient measures 9.0 mmHg. Aortic valve peak gradient measures 20.7 mmHg. Aortic valve area, by VTI measures 1.93 cm. Pulmonic Valve: The pulmonic valve was normal in structure. Pulmonic valve regurgitation is not  visualized. No evidence of pulmonic stenosis. Aorta: There is borderline dilatation of the aortic root, measuring 36 mm. Venous: The inferior vena cava is normal in size with greater than 50% respiratory variability, suggesting right atrial pressure of 3 mmHg. IAS/Shunts: No atrial level shunt detected by color flow Doppler.  LEFT VENTRICLE PLAX 2D LVIDd:         4.80 cm      Diastology LVIDs:  3.10 cm      LV e' medial:    6.84 cm/s LV PW:         1.20 cm      LV E/e' medial:  16.4 LV IVS:        1.30 cm      LV e' lateral:   6.84 cm/s LVOT diam:     1.90 cm      LV E/e' lateral: 16.4 LV SV:         84 LV SV Index:   43 LVOT Area:     2.84 cm  LV Volumes (MOD) LV vol d, MOD A2C: 104.5 ml LV vol d, MOD A4C: 141.0 ml LV vol s, MOD A2C: 42.6 ml LV vol s, MOD A4C: 59.2 ml LV SV MOD A2C:     61.9 ml LV SV MOD A4C:     141.0 ml LV SV MOD BP:      70.9 ml RIGHT VENTRICLE RV S prime:     21.00 cm/s TAPSE (M-mode): 2.7 cm LEFT ATRIUM              Index        RIGHT ATRIUM           Index LA diam:        4.10 cm  2.10 cm/m   RA Area:     19.30 cm LA Vol (A2C):   74.8 ml  38.23 ml/m  RA Volume:   50.90 ml  26.02 ml/m LA Vol (A4C):   104.0 ml 53.16 ml/m LA Biplane Vol: 91.1 ml  46.57 ml/m  AORTIC VALVE AV Area (Vmax):    1.66 cm AV Area (Vmean):   1.84 cm AV Area (VTI):     1.93 cm AV Vmax:           227.67 cm/s AV Vmean:          135.333 cm/s AV VTI:            0.433 m AV Peak Grad:      20.7 mmHg AV Mean Grad:      9.0 mmHg LVOT Vmax:         133.00 cm/s LVOT Vmean:        87.800 cm/s LVOT VTI:          0.295 m LVOT/AV VTI ratio: 0.68 AI PHT:            434 msec  AORTA Ao Root diam: 3.60 cm MITRAL VALVE MV Area (PHT): 3.53 cm     SHUNTS MV Decel Time: 215 msec     Systemic VTI:  0.30 m MR Peak grad: 170.0 mmHg    Systemic Diam: 1.90 cm MR Mean grad: 120.0 mmHg MR Vmax:      652.00 cm/s MR Vmean:     518.0 cm/s MV E velocity: 112.00 cm/s MV A velocity: 122.00 cm/s MV E/A ratio:  0.92 Kardie Tobb DO  Electronically signed by Dub Huntsman DO Signature Date/Time: 06/03/2024/12:22:29 PM    Final    DG Chest Port 1 View Result Date: 06/02/2024 EXAM: 1 VIEW(S) XRAY OF THE CHEST 06/02/2024 10:44:07 PM COMPARISON: 09/11/2017 CLINICAL HISTORY: cp FINDINGS: LUNGS AND PLEURA: No focal pulmonary opacity. No pleural effusion. No pneumothorax. HEART AND MEDIASTINUM: No acute abnormality of the cardiomediastinal silhouette. BONES AND SOFT TISSUES: No acute osseous abnormality. IMPRESSION: 1. No acute cardiopulmonary process. Electronically signed by: Norman Gatlin MD 06/02/2024 10:48 PM EST RP Workstation: [id number]  Assessment/Plan: NSTEMI Continue asa, beta blocker, ACE-I, statin, heparin . Appreciate CV f/u Await CV procedure today, per her report. Cath?  HIV+ Will continue her biktarvy  Has missed doses recently. Should not be of consequence.  HIV 1 RNA Quant  Date Value  08/30/2023 <20 copies/mL  08/17/2021 30 copies/mL  10/30/2020 Not Detected Copies/mL   CD4 T Cell Abs (/uL)  Date Value  08/30/2023 434  05/17/2022 807  10/30/2020 570  Will recheck her labs.     Cocaine use Encouraged her to stop  Reyes Fenton Internal Medicine Teaching Service/Infectious Disease Pager: [phone]  06/04/2024, 8:57 AM      "

## 2024-06-04 NOTE — Progress Notes (Signed)
 PHARMACY - ANTICOAGULATION CONSULT NOTE  Pharmacy Consult for heparin  Indication: chest pain/ACS  Labs: Recent Labs    06/02/24 2238 06/03/24 0811 06/03/24 1353 06/03/24 2217  HGB 12.7 12.0  --   --   HCT 37.2 34.8*  --   --   PLT 249 242  --   --   HEPARINUNFRC  --  0.35 0.14* 0.13*  CREATININE 0.76  --   --   --    Assessment: 53yo female subtherapeutic on heparin  with lower heparin  level despite increased rate; no signs of bleeding per RN but she does note that IV was in [facility] and pump was constantly beeping as occluded.  Goal of Therapy:  Heparin  level 0.3-0.7 units/ml   Plan:  IVT has established new IV site and heparin  has been moved. Increase heparin  infusion slightly to 1200 units/hr. Check level in 6 hours.   Marvetta Dauphin, PharmD, BCPS 06/04/2024 1:39 AM

## 2024-06-04 NOTE — Plan of Care (Signed)

## 2024-06-04 NOTE — Progress Notes (Signed)
" °   06/04/24 1700  Assess: MEWS Score  Temp 99.3 F (37.4 C)  BP 126/78  MAP (mmHg) 93  Pulse Rate 77  ECG Heart Rate 94  Level of Consciousness Alert  SpO2 94 %  O2 Device Room Air  Patient Activity (if Appropriate) In bed  Assess: if the MEWS score is Yellow or Red  Were vital signs accurate and taken at a resting state? Yes   MD made aware See previous note and orders "

## 2024-06-04 NOTE — Progress Notes (Signed)
 "  Lori [name]  FMW:[id number] DOB: 12/18/1970 DOA: 06/02/2024 PCP: Eben Reyes BROCKS, MD    Brief Narrative:  53 year old with a history of HIV on medical therapy, HTN, depression, and obesity who presented to [facility] 12/20 with a sudden onset of chest pain which occurred while she was sitting.  The pain initially started in her right arm but then radiated into her chest and as it worsened and became associated with nausea and diaphoresis.  She described it as pressure-like in nature.  In the ER her UDS was positive for opiates THC and cocaine.  Her presenting troponin was 86 and with steadily increasing values to peak and thus far of 2275.  Presenting EKG noted inferolateral ST depression.  Chest pain resolved with use of nitroglycerin . The EDP discussed the case with the on-call cardiologist at [facility] who recommended transfer to [facility] for evaluation.  Goals of Care:   Code Status: Full Code   DVT prophylaxis: SCDs Start: 06/03/24 0801   Interim Hx: No acute events reported overnight. VSS. Afeb.  No new complaints.  Denies current shortness of breath or significant chest pain.  Assessment & Plan:  NSTEMI EKG noted inferolateral ST depression - troponin markedly elevated in a peaking fashion c/w ACS - continue heparin  and IV nitroglycerin  - Cardiology to see with plan for cardiac cath - TTE this admit notes EF 55-60% w/ no WMA  Cocaine abuse  Directly related to above - pt has been educated and advised she must cease cocaine use completely   Alcohol abuse No evidence of withdrawal thus far   HTN Blood pressure controlled at present  HIV+ - controlled Cont her usual antiviral tx - followed by Dr. Eben   Tobacco abuse  Advised that she must stop smoking   Obesity - Body mass index is 31.94 kg/m.   Family Communication: No family present at time of exam Disposition: Will depend upon results of heart cath   Objective: Blood pressure 136/80, pulse 93,  temperature 98.4 F (36.9 C), temperature source Oral, resp. rate 20, height 5' 4 (1.626 m), weight 84.4 kg, SpO2 99%.  Intake/Output Summary (Last 24 hours) at 06/04/2024 0818 Last data filed at 06/04/2024 0149 Gross per 24 hour  Intake 96.15 ml  Output 550 ml  Net -453.85 ml   Filed Weights   06/02/24 2231 06/03/24 1708  Weight: 90.7 kg 84.4 kg    Examination: General: No acute respiratory distress Lungs: Clear to auscultation bilaterally without wheezes or crackles Cardiovascular: Regular rate and rhythm without murmur gallop or rub normal S1 and S2 Abdomen: Nontender, nondistended, soft, bowel sounds positive, no rebound, no ascites, no appreciable mass Extremities: No significant cyanosis, clubbing, or edema bilateral lower extremities  CBC: Recent Labs  Lab 06/02/24 2238 06/03/24 0811 06/04/24 0737  WBC 8.2 8.9 9.9  HGB 12.7 12.0 12.0  HCT 37.2 34.8* 33.7*  MCV 98.4 97.8 95.2  PLT 249 242 217   Basic Metabolic Panel: Recent Labs  Lab 06/02/24 2238  NA 140  K 3.6  CL 105  CO2 26  GLUCOSE 134*  BUN 16  CREATININE 0.76  CALCIUM  8.5*  MG 2.1   GFR: Estimated Creatinine Clearance: 85.5 mL/min (by C-G formula based on SCr of 0.76 mg/dL).   Scheduled Meds:  aspirin  EC  81 mg Oral Daily   atorvastatin   80 mg Oral Daily   bictegravir-emtricitabine -tenofovir  AF  1 tablet Oral Daily   lisinopril   10 mg Oral Daily  metoprolol  tartrate  12.5 mg Oral BID   Continuous Infusions:  heparin  1,200 Units/hr (06/04/24 0256)   nitroGLYCERIN  200 mcg/min (06/04/24 0612)     LOS: 1 day   Reyes IVAR Moores, MD [facility] Office  [phone] Pager - Text Page per Tracey  If 7PM-7AM, please contact night-coverage per Amion 06/04/2024, 8:18 AM     "

## 2024-06-04 NOTE — Plan of Care (Signed)
   Problem: Education: Goal: Knowledge of General Education information will improve Description: Including pain rating scale, medication(s)/side effects and non-pharmacologic comfort measures Outcome: Progressing   Problem: Clinical Measurements: Goal: Will remain free from infection Outcome: Progressing

## 2024-06-04 NOTE — Consult Note (Addendum)
 "  Cardiology Consultation  Patient ID: BELA BONAPARTE MRN: [id number]; DOB: [birth year]  Admit date: 06/02/2024 Date of Consult: 06/04/2024  PCP:  Eben Reyes BROCKS, MD   [facility] Providers Cardiologist:  None     Patient Profile: Lori [name] is a 53 y.o. female with a hx of hypertension, polysubstance abuse, depression, HIV who is being seen 06/04/2024 for the evaluation of NSTEMI at the request of Dr. Danton.  History of Present Illness: Lori [name] has past medical history as above. She presented to [facility] ED on 06/02/2024 with chest pain. She reported that an hour prior to arrival at the ER she was experiencing right sided arm pain that migrated to her chest. She had associated nausea. She was transferred to [facility] for further evaluation, treatment and possible LHC.   Relevant workup: troponin 204 ? 614 ? 949 ? 1,885 ? 2,275. EKG showed sinus rhythm, HR 74, ST depression in inferior leads, BMP and CBC unremarkable, lipid panel showed LDL 159, UDS positive for opiates, cocaine, THC, fentanyl  (she was given Dilaudid  in [facility] ED).   She has never been seen by cardiology in the past, she has not had an ischemic evaluation prior. She reports that she only takes medications for her HIV as well as Advil , Tylenol  PRN. She reports that she was told she has high blood pressure, but does not take medications for this. She reports to significant tobacco use 1ppd since she was 53yo as well as frequent weed and crack use. She reports last using crack on Friday. She reports improvement in her symptoms since arrival. Doing well on current medications. Discussed LHC with  her, she is willing to proceed.   Past Medical History:  Diagnosis Date   Abnormal Pap smear    Had Cryo to treat   Allergy    Anemia    Anxiety    Cluster headaches    HIV infection (HCC)    Hypertension    Lung nodule    Vaginal venereal warts    Past Surgical History:  Procedure Laterality  Date   ABDOMINAL HYSTERECTOMY     KNEE ARTHROSCOPY Left    bakers cyst removal   TUBAL LIGATION      Home Medications:  Prior to Admission medications  Medication Sig Start Date End Date Taking? Authorizing Provider  acetaminophen  (TYLENOL ) 500 MG tablet Take 500 mg by mouth every 6 (six) hours as needed.   Yes [provider]  bictegravir-emtricitabine -tenofovir  AF (BIKTARVY ) 50-200-25 MG TABS tablet Take 1 tablet by mouth daily. 04/16/24  Yes Eben Reyes BROCKS, MD  ibuprofen  (ADVIL ) 200 MG tablet Take 200 mg by mouth every 6 (six) hours as needed for mild pain (pain score 1-3) or moderate pain (pain score 4-6).   Yes [provider]  loratadine (CLARITIN) 10 MG tablet Take 10 mg by mouth daily.   Yes [provider]  diphenhydramine -acetaminophen  (TYLENOL  PM) 25-500 MG TABS tablet Take 1 tablet by mouth at bedtime as needed (sleep).  08/23/19  [provider]    Scheduled Meds:  aspirin  EC  81 mg Oral Daily   atorvastatin   80 mg Oral Daily   bictegravir-emtricitabine -tenofovir  AF  1 tablet Oral Daily   lisinopril   10 mg Oral Daily   metoprolol  tartrate  12.5 mg Oral BID   nicotine   21 mg Transdermal Daily   Continuous Infusions:  heparin  1,400 Units/hr (06/04/24 1019)   nitroGLYCERIN  200 mcg/min (06/04/24 1033)   PRN Meds:  acetaminophen  **OR** [DISCONTINUED] acetaminophen , hydrOXYzine , LORazepam , morphine  injection, ondansetron  **OR** ondansetron  (ZOFRAN ) IV, prochlorperazine   Allergies:   Allergies[1]  Social History:   Social History   Socioeconomic History   Marital status: Single    Spouse name: Not on file   Number of children: Not on file   Years of education: Not on file   Highest education level: Not on file  Occupational History   Not on file  Tobacco Use   Smoking status: Every Day    Current packs/day: 1.00    Average packs/day: 1 pack/day for 68.0 years (68.0 ttl pk-yrs)    Types: Cigarettes    Start date: 06/03/1983    Smokeless tobacco: Never  Vaping Use   Vaping status: Former   Substances: Nicotine , THC  Substance and Sexual Activity   Alcohol use: Yes   Drug use: Yes    Frequency: 7.0 times per week    Types: Marijuana, Crack cocaine   Sexual activity: Yes    Birth control/protection: Surgical, Condom    Comment: pt. declined condoms  Other Topics Concern   Not on file  Social History Narrative   Not on file   Social Drivers of Health   Tobacco Use: High Risk (09/20/2023)   Patient History    Smoking Tobacco Use: Every Day    Smokeless Tobacco Use: Never    Passive Exposure: Not on file  Financial Resource Strain: Not on file  Food Insecurity: Food Insecurity Present (06/03/2024)   Epic    Worried About Programme Researcher, Broadcasting/film/video in the Last Year: Sometimes true    Ran Out of Food in the Last Year: Never true  Transportation Needs: Unmet Transportation Needs (06/03/2024)   Epic    Lack of Transportation (Medical): Yes    Lack of Transportation (Non-Medical): Yes  Physical Activity: Not on file  Stress: Not on file  Social Connections: Not on file  Intimate Partner Violence: Not At Risk (06/03/2024)   Epic    Fear of Current or Ex-Partner: No    Emotionally Abused: No    Physically Abused: No    Sexually Abused: No  Depression (PHQ2-9): High Risk (09/20/2023)   Depression (PHQ2-9)    PHQ-2 Score: 11  Alcohol Screen: High Risk (11/02/2022)   Alcohol Screen    Last Alcohol Screening Score (AUDIT): 25  Housing: Low Risk (06/03/2024)   Epic    Unable to Pay for Housing in the Last Year: No    Number of Times Moved in the Last Year: 0    Homeless in the Last Year: No  Utilities: Not At Risk (06/03/2024)   Epic    Threatened with loss of utilities: No  Health Literacy: Not on file    Family History:   Family History  Problem Relation Age of Onset   CAD Mother        MI 2016   Uterine cancer Mother    Cervical cancer Mother    Colon cancer Mother    Lung cancer Maternal  Grandmother    Cancer Maternal Grandfather        larynx   Prostate cancer Maternal Grandfather    Prostate cancer Father    Breast cancer Neg Hx    Liver disease Neg Hx    Esophageal cancer Neg Hx    Stomach cancer Neg Hx    Colon polyps Neg Hx     ROS:  Please see the history of present illness.  All other ROS reviewed and  negative.     Physical Exam/Data: Vitals:   06/03/24 2200 06/03/24 2336 06/04/24 0350 06/04/24 0748  BP: (!) 147/87 128/86 119/76 136/80  Pulse:  60 61 93  Resp:  18 18 20   Temp:  98.9 F (37.2 C) 98.9 F (37.2 C) 98.4 F (36.9 C)  TempSrc:  Oral Oral Oral  SpO2: 98% 96% 94% 99%  Weight:      Height:        Intake/Output Summary (Last 24 hours) at 06/04/2024 1118 Last data filed at 06/04/2024 0149 Gross per 24 hour  Intake 96.15 ml  Output 550 ml  Net -453.85 ml      06/03/2024    5:08 PM 06/02/2024   10:31 PM 09/20/2023    4:01 PM  Last 3 Weights  Weight (lbs) 186 lb 1.1 oz 200 lb 160 lb 9.6 oz  Weight (kg) 84.4 kg 90.719 kg 72.848 kg     Body mass index is 31.94 kg/m.   Physical exam Per MD  EKG:  The EKG was personally reviewed and demonstrates:  sinus with inferolateral ST depression  Telemetry:  Telemetry was personally reviewed and demonstrates:  sinus  Relevant CV Studies:  Echocardiogram, 06/03/2024 Left ventricular ejection fraction, by estimation, is 55 to 60% . The left ventricle has normal function. The left ventricle has no regional wall motion abnormalities. There is mild concentric left ventricular hypertrophy. Left ventricular diastolic parameters are consistent with Grade I diastolic dysfunction ( impaired relaxation) .  Right ventricular systolic function is normal. The right ventricular size is normal.  Left atrial size was moderately dilated.  The mitral valve is normal in structure. Mild to moderate mitral valve regurgitation. No evidence of mitral stenosis.  The aortic valve is normal in structure. Aortic valve  regurgitation is mild to moderate. Aortic valve sclerosis/ calcification is present, without any evidence of aortic stenosis. Aortic regurgitation PHT measures 434 msec.  There is borderline dilatation of the aortic root, measuring 36 mm. The inferior vena cava is normal in size with greater than 50% respiratory variability, suggesting right atrial pressure of 3 mmHg.  Laboratory Data: High Sensitivity Troponin:  No results for input(s): TROPONINIHS in the last 720 hours.  Recent Labs  Lab 06/03/24 0811 06/03/24 1046 06/03/24 1248 06/03/24 1926 06/03/24 2217  TRNPT 658* 890* 949* 1,885* 2,275*      Chemistry Recent Labs  Lab 06/02/24 2238 06/04/24 0737  NA 140 140  K 3.6 3.5  CL 105 101  CO2 26 29  GLUCOSE 134* 124*  BUN 16 6  CREATININE 0.76 0.60  CALCIUM  8.5* 9.1  MG 2.1  --   GFRNONAA >60 >60  ANIONGAP 9 10    No results for input(s): PROT, ALBUMIN, AST, ALT, ALKPHOS, BILITOT in the last 168 hours. Lipids  Recent Labs  Lab 06/03/24 0811  CHOL 231*  TRIG 127  HDL 47  LDLCALC 159*  CHOLHDL 4.9    Hematology Recent Labs  Lab 06/02/24 2238 06/03/24 0811 06/04/24 0737  WBC 8.2 8.9 9.9  RBC 3.78* 3.56* 3.54*  HGB 12.7 12.0 12.0  HCT 37.2 34.8* 33.7*  MCV 98.4 97.8 95.2  MCH 33.6 33.7 33.9  MCHC 34.1 34.5 35.6  RDW 12.3 12.3 12.1  PLT 249 242 217   Thyroid No results for input(s): TSH, FREET4 in the last 168 hours.  BNPNo results for input(s): BNP, PROBNP in the last 168 hours.  DDimer No results for input(s): DDIMER in the last 168 hours.  Radiology/Studies:  ECHOCARDIOGRAM COMPLETE Result Date: 06/03/2024    ECHOCARDIOGRAM REPORT   Patient Name:   Lori [name] Date of Exam: 06/03/2024 Medical Rec #:  [id number]           Height:       64.0 in Accession #:    [id number]          Weight:       200.0 lb Date of Birth:  03-12-1971            BSA:          1.956 m Patient Age:    53 years            BP:           151/95 mmHg  Patient Gender: F                   HR:           78 bpm. Exam Location:  [facility] Procedure: 2D Echo, Cardiac Doppler and Color Doppler (Both Spectral and Color            Flow Doppler were utilized during procedure). Indications:    Chest Pain R07.9  History:        Patient has no prior history of Echocardiogram examinations. CAD                 and Previous Myocardial Infarction; Risk Factors:Hypertension                 and Current Smoker. Alcohol abuse with alcohol-induced mood                 disorder (HCC). Cocaine abuse (HCC). Cannabis use disorder.  Sonographer:    Aida Pizza RCS Referring Phys: [id number] MAURICIO DANIEL ARRIEN IMPRESSIONS  1. Left ventricular ejection fraction, by estimation, is 55 to 60%. The left ventricle has normal function. The left ventricle has no regional wall motion abnormalities. There is mild concentric left ventricular hypertrophy. Left ventricular diastolic parameters are consistent with Grade I diastolic dysfunction (impaired relaxation).  2. Right ventricular systolic function is normal. The right ventricular size is normal.  3. Left atrial size was moderately dilated.  4. The mitral valve is normal in structure. Mild to moderate mitral valve regurgitation. No evidence of mitral stenosis.  5. The aortic valve is normal in structure. Aortic valve regurgitation is mild to moderate. Aortic valve sclerosis/calcification is present, without any evidence of aortic stenosis. Aortic regurgitation PHT measures 434 msec.  6. There is borderline dilatation of the aortic root, measuring 36 mm.  7. The inferior vena cava is normal in size with greater than 50% respiratory variability, suggesting right atrial pressure of 3 mmHg. FINDINGS  Left Ventricle: Left ventricular ejection fraction, by estimation, is 55 to 60%. The left ventricle has normal function. The left ventricle has no regional wall motion abnormalities. The left ventricular internal cavity size was normal in size. There  is  mild concentric left ventricular hypertrophy. Left ventricular diastolic parameters are consistent with Grade I diastolic dysfunction (impaired relaxation). Right Ventricle: The right ventricular size is normal. No increase in right ventricular wall thickness. Right ventricular systolic function is normal. Left Atrium: Left atrial size was moderately dilated. Right Atrium: Right atrial size was normal in size. Pericardium: There is no evidence of pericardial effusion. Mitral Valve: The mitral valve is normal in structure. Mild to moderate mitral valve regurgitation. No evidence of mitral valve stenosis. Tricuspid Valve: The tricuspid valve  is normal in structure. Tricuspid valve regurgitation is not demonstrated. No evidence of tricuspid stenosis. Aortic Valve: The aortic valve is normal in structure. Aortic valve regurgitation is mild to moderate. Aortic regurgitation PHT measures 434 msec. Aortic valve sclerosis/calcification is present, without any evidence of aortic stenosis. Aortic valve mean  gradient measures 9.0 mmHg. Aortic valve peak gradient measures 20.7 mmHg. Aortic valve area, by VTI measures 1.93 cm. Pulmonic Valve: The pulmonic valve was normal in structure. Pulmonic valve regurgitation is not visualized. No evidence of pulmonic stenosis. Aorta: There is borderline dilatation of the aortic root, measuring 36 mm. Venous: The inferior vena cava is normal in size with greater than 50% respiratory variability, suggesting right atrial pressure of 3 mmHg. IAS/Shunts: No atrial level shunt detected by color flow Doppler.  LEFT VENTRICLE PLAX 2D LVIDd:         4.80 cm      Diastology LVIDs:         3.10 cm      LV e' medial:    6.84 cm/s LV PW:         1.20 cm      LV E/e' medial:  16.4 LV IVS:        1.30 cm      LV e' lateral:   6.84 cm/s LVOT diam:     1.90 cm      LV E/e' lateral: 16.4 LV SV:         84 LV SV Index:   43 LVOT Area:     2.84 cm  LV Volumes (MOD) LV vol d, MOD A2C: 104.5 ml LV vol d,  MOD A4C: 141.0 ml LV vol s, MOD A2C: 42.6 ml LV vol s, MOD A4C: 59.2 ml LV SV MOD A2C:     61.9 ml LV SV MOD A4C:     141.0 ml LV SV MOD BP:      70.9 ml RIGHT VENTRICLE RV S prime:     21.00 cm/s TAPSE (M-mode): 2.7 cm LEFT ATRIUM              Index        RIGHT ATRIUM           Index LA diam:        4.10 cm  2.10 cm/m   RA Area:     19.30 cm LA Vol (A2C):   74.8 ml  38.23 ml/m  RA Volume:   50.90 ml  26.02 ml/m LA Vol (A4C):   104.0 ml 53.16 ml/m LA Biplane Vol: 91.1 ml  46.57 ml/m  AORTIC VALVE AV Area (Vmax):    1.66 cm AV Area (Vmean):   1.84 cm AV Area (VTI):     1.93 cm AV Vmax:           227.67 cm/s AV Vmean:          135.333 cm/s AV VTI:            0.433 m AV Peak Grad:      20.7 mmHg AV Mean Grad:      9.0 mmHg LVOT Vmax:         133.00 cm/s LVOT Vmean:        87.800 cm/s LVOT VTI:          0.295 m LVOT/AV VTI ratio: 0.68 AI PHT:            434 msec  AORTA Ao Root diam: 3.60 cm MITRAL VALVE MV Area (PHT):  3.53 cm     SHUNTS MV Decel Time: 215 msec     Systemic VTI:  0.30 m MR Peak grad: 170.0 mmHg    Systemic Diam: 1.90 cm MR Mean grad: 120.0 mmHg MR Vmax:      652.00 cm/s MR Vmean:     518.0 cm/s MV E velocity: 112.00 cm/s MV A velocity: 122.00 cm/s MV E/A ratio:  0.92 Kardie Tobb DO Electronically signed by Dub Huntsman DO Signature Date/Time: 06/03/2024/12:22:29 PM    Final    DG Chest Port 1 View Result Date: 06/02/2024 EXAM: 1 VIEW(S) XRAY OF THE CHEST 06/02/2024 10:44:07 PM COMPARISON: 09/11/2017 CLINICAL HISTORY: cp FINDINGS: LUNGS AND PLEURA: No focal pulmonary opacity. No pleural effusion. No pneumothorax. HEART AND MEDIASTINUM: No acute abnormality of the cardiomediastinal silhouette. BONES AND SOFT TISSUES: No acute osseous abnormality. IMPRESSION: 1. No acute cardiopulmonary process. Electronically signed by: Norman Gatlin MD 06/02/2024 10:48 PM EST RP Workstation: [id number]   Assessment and Plan:  NSTEMI Presented to [facility] 12/20 with chest pain Troponin level 204 ?  614 ? 949 ? 1,885 ? 2,275 EKG shows sinus with inferolateral ST depression Transferred to [facility] for cath Creatinine normal  NPO since midnight Loaded with ASA in [facility] ER Started on IV heparin , IV NTG  Reports improvement in symptoms from arrival Scheduled for LHC today  Currently on Lopressor  12.5 mg BID Continue ASA 81 mg daily Continue stain  Continue IV heparin , IV NTG   Informed Consent   Shared Decision Making/Informed Consent The risks [stroke (1 in 1000), death (1 in 1000), kidney failure [usually temporary] (1 in 500), bleeding (1 in 200), allergic reaction [possibly serious] (1 in 200)], benefits (diagnostic support and management of coronary artery disease) and alternatives of a cardiac catheterization were discussed in detail with Ms. Mccarney and she is willing to proceed.    Hypertension Presented with SBP > 200s  Not taking antihypertensive therapy at home UDS positive for cocaine Started on IV NTG Most recent BP 136/80 Continue IV NTG Currently on lisinopril  10 mg daily  Currently on Lopressor  12.5 mg BID   Hyperlipidemia 08/30/2023: ALT 15 06/03/2024: HDL 47; LDL Cholesterol 159  Started on statin this admission Continue Lipitor  80 mg daily Check lipid panel/LFTs in 6-8 weeks   Polysubstance abuse Admits to tobacco use 1 pack per day since she was 12 year Also admits to frequent weed and crack cocaine use  Discussed the importance of cessation   Risk Assessment/Risk Scores:    TIMI Risk Score for Unstable Angina or Non-ST Elevation MI:   The patient's TIMI risk score is 2, which indicates a 8% risk of all cause mortality, new or recurrent myocardial infarction or need for urgent revascularization in the next 14 days.     For questions or updates, please contact [facility] Please consult [email or web address] for contact info under   Signed, Waddell DELENA Donath, PA-C  06/04/2024 11:18 AM  Lori [name] was seen by me today along with Waddell Donath, PA-C. I have personally performed an evaluation on this patient.  My findings are as follows: 53 y.o. female with history of HTN, PSA, HIV admitted to [facility] with chest pain and troponin elevation c/w NSTEMI  Data: EKG(s) and pertinent labs, studies, etc were personally reviewed and interpreted by me:  Tele: sinus Otherwise, I agree with data as outlined by the advanced practice provider.  Exam performed by me: Gen: NAD Neck: No JVD Cardiac: RRR Lungs: clear  bilat Extremities: no LE edema  My Assessment and Plan:  NSTEMI: Plan cardiac cath today.   Signed,  Lonni Cash, MD  06/04/2024 2:05 PM      [1]  Allergies Allergen Reactions   Doxycycline Swelling   Other Swelling    Mulberry fruit    Sulfonamide Derivatives Other (See Comments)    Childhood allergy.   "

## 2024-06-05 ENCOUNTER — Other Ambulatory Visit (HOSPITAL_COMMUNITY): Payer: Self-pay

## 2024-06-05 ENCOUNTER — Other Ambulatory Visit: Payer: Self-pay | Admitting: Cardiology

## 2024-06-05 ENCOUNTER — Telehealth (HOSPITAL_COMMUNITY): Payer: Self-pay

## 2024-06-05 ENCOUNTER — Ambulatory Visit

## 2024-06-05 DIAGNOSIS — I214 Non-ST elevation (NSTEMI) myocardial infarction: Secondary | ICD-10-CM | POA: Diagnosis not present

## 2024-06-05 DIAGNOSIS — B20 Human immunodeficiency virus [HIV] disease: Secondary | ICD-10-CM | POA: Diagnosis not present

## 2024-06-05 DIAGNOSIS — I480 Paroxysmal atrial fibrillation: Secondary | ICD-10-CM

## 2024-06-05 DIAGNOSIS — F1410 Cocaine abuse, uncomplicated: Secondary | ICD-10-CM | POA: Diagnosis not present

## 2024-06-05 DIAGNOSIS — I10 Essential (primary) hypertension: Secondary | ICD-10-CM | POA: Diagnosis not present

## 2024-06-05 LAB — BASIC METABOLIC PANEL WITH GFR
Anion gap: 10 (ref 5–15)
BUN: 12 mg/dL (ref 6–20)
CO2: 29 mmol/L (ref 22–32)
Calcium: 9.1 mg/dL (ref 8.9–10.3)
Chloride: 101 mmol/L (ref 98–111)
Creatinine, Ser: 0.68 mg/dL (ref 0.44–1.00)
GFR, Estimated: >60 mL/min
Glucose, Bld: 111 mg/dL — ABNORMAL HIGH (ref 70–99)
Potassium: 3.7 mmol/L (ref 3.5–5.1)
Sodium: 139 mmol/L (ref 135–145)

## 2024-06-05 LAB — CBC
HCT: 36.1 % (ref 36.0–46.0)
Hemoglobin: 12.4 g/dL (ref 12.0–15.0)
MCH: 33.5 pg (ref 26.0–34.0)
MCHC: 34.3 g/dL (ref 30.0–36.0)
MCV: 97.6 fL (ref 80.0–100.0)
Platelets: 190 K/uL (ref 150–400)
RBC: 3.7 MIL/uL — ABNORMAL LOW (ref 3.87–5.11)
RDW: 12.2 % (ref 11.5–15.5)
WBC: 10.8 K/uL — ABNORMAL HIGH (ref 4.0–10.5)
nRBC: 0 % (ref 0.0–0.2)

## 2024-06-05 MED ORDER — ROSUVASTATIN CALCIUM 40 MG PO TABS
40.0000 mg | ORAL_TABLET | Freq: Every day | ORAL | 0 refills | Status: AC
  Filled 2024-06-05: qty 90, 90d supply, fill #0

## 2024-06-05 MED ORDER — LISINOPRIL 10 MG PO TABS
10.0000 mg | ORAL_TABLET | Freq: Every day | ORAL | 0 refills | Status: AC
  Filled 2024-06-05: qty 90, 90d supply, fill #0

## 2024-06-05 MED ORDER — APIXABAN 5 MG PO TABS
5.0000 mg | ORAL_TABLET | Freq: Two times a day (BID) | ORAL | 0 refills | Status: AC
  Filled 2024-06-05: qty 180, 90d supply, fill #0

## 2024-06-05 MED ORDER — ROSUVASTATIN CALCIUM 20 MG PO TABS
40.0000 mg | ORAL_TABLET | Freq: Every day | ORAL | Status: DC
  Administered 2024-06-05: 40 mg via ORAL
  Filled 2024-06-05: qty 2

## 2024-06-05 MED ORDER — CARVEDILOL 6.25 MG PO TABS
6.2500 mg | ORAL_TABLET | Freq: Two times a day (BID) | ORAL | 0 refills | Status: AC
  Filled 2024-06-05: qty 180, 90d supply, fill #0

## 2024-06-05 MED ORDER — POTASSIUM CHLORIDE CRYS ER 20 MEQ PO TBCR
40.0000 meq | EXTENDED_RELEASE_TABLET | Freq: Once | ORAL | Status: AC
  Administered 2024-06-05: 40 meq via ORAL
  Filled 2024-06-05: qty 2

## 2024-06-05 MED ORDER — CARVEDILOL 6.25 MG PO TABS: 6.2500 mg | ORAL_TABLET | Freq: Two times a day (BID) | ORAL | Status: DC

## 2024-06-05 MED ORDER — APIXABAN 5 MG PO TABS
5.0000 mg | ORAL_TABLET | Freq: Two times a day (BID) | ORAL | Status: DC
  Administered 2024-06-05: 5 mg via ORAL
  Filled 2024-06-05: qty 1

## 2024-06-05 NOTE — Progress Notes (Signed)
 Cardiac monitor ordered per Dr. Court

## 2024-06-05 NOTE — Progress Notes (Addendum)
 "  Progress Note  Patient Name: Lori [name] Date of Encounter: 06/05/2024 [facility] Cardiologist: None   Interval Summary    Feeling well this morning, friend at the bedside.   Vital Signs Vitals:   06/05/24 0017 06/05/24 0431 06/05/24 0432 06/05/24 0831  BP: 95/65 109/79  97/83  Pulse: 75 95 96   Resp: 17  18 16   Temp: 99.1 F (37.3 C)  98.6 F (37 C) 98.4 F (36.9 C)  TempSrc: Oral  Oral Oral  SpO2:  96%    Weight:      Height:        Intake/Output Summary (Last 24 hours) at 06/05/2024 1132 Last data filed at 06/05/2024 0900 Gross per 24 hour  Intake 1743 ml  Output 850 ml  Net 893 ml      06/03/2024    5:08 PM 06/02/2024   10:31 PM 09/20/2023    4:01 PM  Last 3 Weights  Weight (lbs) 186 lb 1.1 oz 200 lb 160 lb 9.6 oz  Weight (kg) 84.4 kg 90.719 kg 72.848 kg      Telemetry/ECG   Atrial fibrillation with conversion to sinus rhythm last evening, intermittent SVT - Personally Reviewed  Physical Exam  GEN: No acute distress.   Neck: No JVD Cardiac: RRR, no murmurs, rubs, or gallops.  Respiratory: Clear to auscultation bilaterally. GI: Soft, nontender, non-distended  MS: No edema VAS: right radial cath site stable  Assessment & Plan   53 y.o. female with a hx of hypertension, polysubstance abuse, depression, HIV who was seen 06/04/2024 for the evaluation of NSTEMI at the request of Dr. Danton   NSTEMI -- Presented to [facility] ER 12/20 with chest pain, found to have elevated hsTn  204 ? 614 ? 949 ? 1,885 ? 2,275 -- EKG showed sinus with inferolateral ST depression -- transferred to [facility] and underwent cardiac cath 12/22 showing thrombotic occlusion of 1st diag, likely closed for greater than 48hrs. Elected to treat medically. Initially placed on DAPT with ASA/plavix  but noted to have new on set atrial fibrillation overnight therefore will transition to Eliquis  alone.  -- continue statin, switch to coreg  6.25mg  BID (with hx of cocaine  use)   New onset atrial fibrillation SVT -- noted overnight, as above, will stop ASA/plavix  and start Eliquis  5mg  BID -- coreg  6.25mg  BID -- will plan for outpatient cardiac monitor   Hypertension -- Presented with SBP > 200s and was not taking antihypertensive  -- wean/stop IV NTG -- switch from metoprolol  to coreg  6.25mg  BID, continue lisinopril     Hyperlipidemia -- 06/03/2024: HDL 47; LDL Cholesterol 159  -- Continue Lipitor  80 mg daily -- check lipid panel/LFTs in 6-8 weeks    Polysubstance abuse -- Admits to tobacco use 1 pack per day since she was 12 year,  frequent weed and crack cocaine use  -- Discussed the importance of cessation    For questions or updates, please contact [facility] Please consult [email or web address] for contact info under   Signed, Manuelita Rummer, NP    Agree with note by Manuelita Rummer NP-C  Patient mated with non-STEMI.  Troponins went up to 2000.  EKG showed no acute changes.  LV function was normal.  She did have left heart cath by Dr. Verlin yesterday that revealed a thrombotically occluded high first diagonal branch with some mild to moderate nonobstructive disease otherwise.  Given her delayed presentation it was decided not to intervene.  She is on low-dose IV  nitroglycerin  which we discontinued.  She can ambulate today.  She was noted to have episodes of PAF on telemetry.  Based on this I decided to stop DAPT and start Eliquis .  We will obtain a 2-week Zio patch.  If she has no pain on ambulation she can be discharged home later later today.  Will arrange outpatient follow-up with [facility] in 7 to 10 days and with Dr. Mady with Dr. Verlin after that.   Dorn DOROTHA Lesches, M.D., FACP, [facility], LYNITA [facility] [address] [place], [place]  [address]  [phone] 06/05/2024 12:05 PM  "

## 2024-06-05 NOTE — Discharge Summary (Addendum)
 "  DISCHARGE SUMMARY  Lori [name]  MR#: [id number]  DOB:Jan 22, 1971  Date of Admission: 06/02/2024 Date of Discharge: 06/05/2024  Attending Physician:Neyla Gauntt ONEIDA Moores, MD  Patient's ERE:Lori, Reyes BROCKS, MD  Disposition: D/C home   Follow-up Appts:  Follow-up Information     Eben Reyes BROCKS, MD Follow up.   Specialties: Infectious Diseases, Internal Medicine Why: Keep your scheduled appointment. Contact information: [address], [address] [place] [address] [phone]         Court Dorn JINNY, MD Follow up.   Specialties: Cardiology, Radiology Why: The office will contanct you to make a follow up appointment. Contact information: [address] [place] [address] [phone]                  Discharge Diagnoses: NSTEMI due to cocaine use  Cocaine abuse  Alcohol abuse Newly diagnosed Afib  HTN HIV+ - controlled Tobacco abuse  Obesity - Body mass index is 31.94 kg/m.  Initial presentation: 53 year old with a history of HIV on medical therapy, HTN, depression, and obesity who presented to [facility] 12[facility]20 with a sudden onset of chest pain which occurred while she was sitting.  The pain initially started in her right arm but then radiated into her chest and as it worsened became associated with nausea and diaphoresis.  She described it as pressure-like in nature.  In the ER her UDS was positive for opiates THC and cocaine.  Her presenting troponin was 86 with steadily increasing values to a peak of 2275.  Presenting EKG noted inferolateral ST depression.  Chest pain resolved with use of nitroglycerin . The EDP discussed the case with the on-call cardiologist at [facility] who recommended transfer to [facility] for evaluation.   Hospital Course:  NSTEMI EKG noted inferolateral ST depression - troponin markedly elevated in a peaking fashion c/w ACS - TTE this admit notes EF 55-60% w/ no WMA -cardiac cath 12/22 noted thrombotic  occlusion of a moderate caliber diagonal branch as well as mild nonobstructive disease in the LAD and circumflex and moderate nonobstructive disease in the right posterolateral -the occluded diagonal was felt to be the culprit lesion for her NSTEMI -decision was made that intervention was not appropriate - Cardiology initially suggested continuing aspirin  and adding Plavix , but with new Afib the decision was made to stop ASA & Plavix  and utilize Eliquis  instead    Cocaine abuse  Directly related to above - pt has been educated and advised she must cease cocaine use completely    Alcohol abuse Patient experienced a bout of low-grade anxiety with tachycardia 12/22 evening which responded well to a single dose of Ativan  with Lopressor    Newly diagnosed Afib  Noted on tele 12/22 PM - Eliquis  initiated - Coreg  initiated - rate controlled at time of d/c - 2 week Zio patch arranged by Cardiology at the time of d/c    HTN Blood pressure controlled    HIV+ - controlled Cont her usual antiviral tx - followed by Dr. Eben    Tobacco abuse  Advised that she must stop smoking    Obesity - Body mass index is 31.94 kg/m.  Allergies as of 06/05/2024       Reactions   Doxycycline Swelling   Other Swelling   Mulberry fruit    Sulfonamide Derivatives Other (See Comments)   Childhood allergy.        Medication List     STOP taking these medications    ibuprofen   200 MG tablet Commonly known as: ADVIL    loratadine 10 MG tablet Commonly known as: CLARITIN       TAKE these medications    acetaminophen  500 MG tablet Commonly known as: TYLENOL  Take 500 mg by mouth every 6 (six) hours as needed.   apixaban  5 MG Tabs tablet Commonly known as: ELIQUIS  Take 1 tablet (5 mg total) by mouth 2 (two) times daily.   Biktarvy  50-200-25 MG Tabs tablet Generic drug: bictegravir-emtricitabine -tenofovir  AF Take 1 tablet by mouth daily.   carvedilol  6.25 MG tablet Commonly known as:  COREG  Take 1 tablet (6.25 mg total) by mouth 2 (two) times daily with a meal.   lisinopril  10 MG tablet Commonly known as: ZESTRIL  Take 1 tablet (10 mg) by mouth daily.   rosuvastatin  40 MG tablet Commonly known as: CRESTOR  Take 1 tablet (40 mg total) by mouth daily.        Day of Discharge BP 97/83 (BP Location: Left Arm)   Pulse 96   Temp 98.4 F (36.9 C) (Oral)   Resp 16   Ht 5' 4 (1.626 m)   Wt 84.4 kg   SpO2 96%   BMI 31.94 kg/m   Physical Exam: General: No acute respiratory distress Lungs: Clear to auscultation bilaterally without wheezes or crackles Cardiovascular: Regular rate and rhythm without murmur gallop or rub normal S1 and S2 Abdomen: Nontender, nondistended, soft, bowel sounds positive, no rebound, no ascites, no appreciable mass Extremities: No significant cyanosis, clubbing, or edema bilateral lower extremities  Basic Metabolic Panel: Recent Labs  Lab 06/02/24 2238 06/04/24 0737 06/05/24 0254  NA 140 140 139  K 3.6 3.5 3.7  CL 105 101 101  CO2 26 29 29   GLUCOSE 134* 124* 111*  BUN 16 6 12   CREATININE 0.76 0.60 0.68  CALCIUM  8.5* 9.1 9.1  MG 2.1  --   --     CBC: Recent Labs  Lab 06/02/24 2238 06/03/24 0811 06/04/24 0737 06/05/24 0254  WBC 8.2 8.9 9.9 10.8*  HGB 12.7 12.0 12.0 12.4  HCT 37.2 34.8* 33.7* 36.1  MCV 98.4 97.8 95.2 97.6  PLT 249 242 217 190    Time spent in discharge (includes decision making & examination of pt): 35 minutes  06/05/2024, 11:52 AM   Reyes IVAR Moores, MD [facility] Office  [phone]      "

## 2024-06-05 NOTE — Progress Notes (Addendum)
 "   [facility]    Date of Admission:  06/02/2024      ID: Lori [name] is a 53 y.o. female with   Principal Problem:   NSTEMI (non-ST elevated myocardial infarction) (HCC) Active Problems:   Human immunodeficiency virus (HIV) disease (HCC)   TOBACCO ABUSE   Hypertension   Cocaine abuse (HCC)   Alcohol abuse with alcohol-induced mood disorder (HCC)   Obesity, class 1    Subjective: Feels much better  Medications:   aspirin  EC  81 mg Oral Daily   atorvastatin   80 mg Oral Daily   bictegravir-emtricitabine -tenofovir  AF  1 tablet Oral Daily   clopidogrel   75 mg Oral Daily   lisinopril   10 mg Oral Daily   metoprolol  tartrate  12.5 mg Oral BID   nicotine   21 mg Transdermal Daily   sodium chloride  flush  3 mL Intravenous Q12H    Objective: Vital signs in last 24 hours: Temp:  [98.3 F (36.8 C)-99.4 F (37.4 C)] 98.4 F (36.9 C) (12/23 0831) Pulse Rate:  [60-115] 96 (12/23 0432) Resp:  [8-20] 16 (12/23 0831) BP: (95-149)/(65-96) 97/83 (12/23 0831) SpO2:  [93 %-98 %] 96 % (12/23 0431)   General appearance: alert, cooperative, and no distress Resp: clear to auscultation bilaterally Cardio: regular rate and rhythm GI: normal findings: bowel sounds normal and soft, non-tender Extremities: edema none  Lab Results Recent Labs    06/04/24 0737 06/05/24 0254  WBC 9.9 10.8*  HGB 12.0 12.4  HCT 33.7* 36.1  NA 140 139  K 3.5 3.7  CL 101 101  CO2 29 29  BUN 6 12  CREATININE 0.60 0.68   Liver Panel No results for input(s): PROT, ALBUMIN, AST, ALT, ALKPHOS, BILITOT, BILIDIR, IBILI in the last 72 hours. Sedimentation Rate No results for input(s): ESRSEDRATE in the last 72 hours. C-Reactive Protein No results for input(s): CRP in the last 72 hours.  Microbiology:  Studies/Results: CARDIAC CATHETERIZATION Result Date: 06/04/2024   2nd RPL lesion is 50% stenosed.   RPAV lesion is 50% stenosed.   Mid Cx to Dist Cx  lesion is 40% stenosed.   1st Diag lesion is 100% stenosed.   Mid LAD lesion is 30% stenosed. Thrombotic occlusion of the moderate caliber diagonal branch. This vessel has been closed for greater than 48 hours. She is now pain free Mild non-obstructive disease in the LAD and Circumflex Moderate non-obstructive disease in the right posterolateral artery. LVEDP=15 mmHg Recommendations: Her culprit lesion for the NSTEMI is the occluded Diagonal. This vessel has likely been occluded for over 48 hours. She is chest pain free. Reviewed with IC team. Will not attempt to open the vessel. Also question of the potential for non-compliance with DAPT if a stent were placed with ongoing crack cocaine abuse. Continue ASA. Will start Plavix .   ECHOCARDIOGRAM COMPLETE Result Date: 06/03/2024    ECHOCARDIOGRAM REPORT   Patient Name:   Lori [name] Date of Exam: 06/03/2024 Medical Rec #:  [id number]           Height:       64.0 in Accession #:    [id number]          Weight:       200.0 lb Date of Birth:  [birth year]            BSA:          1.956 m Patient Age:    44 years  BP:           151/95 mmHg Patient Gender: F                   HR:           78 bpm. Exam Location:  [facility] Procedure: 2D Echo, Cardiac Doppler and Color Doppler (Both Spectral and Color            Flow Doppler were utilized during procedure). Indications:    Chest Pain R07.9  History:        Patient has no prior history of Echocardiogram examinations. CAD                 and Previous Myocardial Infarction; Risk Factors:Hypertension                 and Current Smoker. Alcohol abuse with alcohol-induced mood                 disorder (HCC). Cocaine abuse (HCC). Cannabis use disorder.  Sonographer:    Aida Pizza RCS Referring Phys: [id number] MAURICIO DANIEL ARRIEN IMPRESSIONS  1. Left ventricular ejection fraction, by estimation, is 55 to 60%. The left ventricle has normal function. The left ventricle has no regional wall motion abnormalities.  There is mild concentric left ventricular hypertrophy. Left ventricular diastolic parameters are consistent with Grade I diastolic dysfunction (impaired relaxation).  2. Right ventricular systolic function is normal. The right ventricular size is normal.  3. Left atrial size was moderately dilated.  4. The mitral valve is normal in structure. Mild to moderate mitral valve regurgitation. No evidence of mitral stenosis.  5. The aortic valve is normal in structure. Aortic valve regurgitation is mild to moderate. Aortic valve sclerosis/calcification is present, without any evidence of aortic stenosis. Aortic regurgitation PHT measures 434 msec.  6. There is borderline dilatation of the aortic root, measuring 36 mm.  7. The inferior vena cava is normal in size with greater than 50% respiratory variability, suggesting right atrial pressure of 3 mmHg. FINDINGS  Left Ventricle: Left ventricular ejection fraction, by estimation, is 55 to 60%. The left ventricle has normal function. The left ventricle has no regional wall motion abnormalities. The left ventricular internal cavity size was normal in size. There is  mild concentric left ventricular hypertrophy. Left ventricular diastolic parameters are consistent with Grade I diastolic dysfunction (impaired relaxation). Right Ventricle: The right ventricular size is normal. No increase in right ventricular wall thickness. Right ventricular systolic function is normal. Left Atrium: Left atrial size was moderately dilated. Right Atrium: Right atrial size was normal in size. Pericardium: There is no evidence of pericardial effusion. Mitral Valve: The mitral valve is normal in structure. Mild to moderate mitral valve regurgitation. No evidence of mitral valve stenosis. Tricuspid Valve: The tricuspid valve is normal in structure. Tricuspid valve regurgitation is not demonstrated. No evidence of tricuspid stenosis. Aortic Valve: The aortic valve is normal in structure. Aortic valve  regurgitation is mild to moderate. Aortic regurgitation PHT measures 434 msec. Aortic valve sclerosis/calcification is present, without any evidence of aortic stenosis. Aortic valve mean  gradient measures 9.0 mmHg. Aortic valve peak gradient measures 20.7 mmHg. Aortic valve area, by VTI measures 1.93 cm. Pulmonic Valve: The pulmonic valve was normal in structure. Pulmonic valve regurgitation is not visualized. No evidence of pulmonic stenosis. Aorta: There is borderline dilatation of the aortic root, measuring 36 mm. Venous: The inferior vena cava is normal in size with greater than 50% respiratory  variability, suggesting right atrial pressure of 3 mmHg. IAS/Shunts: No atrial level shunt detected by color flow Doppler.  LEFT VENTRICLE PLAX 2D LVIDd:         4.80 cm      Diastology LVIDs:         3.10 cm      LV e' medial:    6.84 cm/s LV PW:         1.20 cm      LV E/e' medial:  16.4 LV IVS:        1.30 cm      LV e' lateral:   6.84 cm/s LVOT diam:     1.90 cm      LV E/e' lateral: 16.4 LV SV:         84 LV SV Index:   43 LVOT Area:     2.84 cm  LV Volumes (MOD) LV vol d, MOD A2C: 104.5 ml LV vol d, MOD A4C: 141.0 ml LV vol s, MOD A2C: 42.6 ml LV vol s, MOD A4C: 59.2 ml LV SV MOD A2C:     61.9 ml LV SV MOD A4C:     141.0 ml LV SV MOD BP:      70.9 ml RIGHT VENTRICLE RV S prime:     21.00 cm/s TAPSE (M-mode): 2.7 cm LEFT ATRIUM              Index        RIGHT ATRIUM           Index LA diam:        4.10 cm  2.10 cm/m   RA Area:     19.30 cm LA Vol (A2C):   74.8 ml  38.23 ml/m  RA Volume:   50.90 ml  26.02 ml/m LA Vol (A4C):   104.0 ml 53.16 ml/m LA Biplane Vol: 91.1 ml  46.57 ml/m  AORTIC VALVE AV Area (Vmax):    1.66 cm AV Area (Vmean):   1.84 cm AV Area (VTI):     1.93 cm AV Vmax:           227.67 cm/s AV Vmean:          135.333 cm/s AV VTI:            0.433 m AV Peak Grad:      20.7 mmHg AV Mean Grad:      9.0 mmHg LVOT Vmax:         133.00 cm/s LVOT Vmean:        87.800 cm/s LVOT VTI:          0.295 m  LVOT/AV VTI ratio: 0.68 AI PHT:            434 msec  AORTA Ao Root diam: 3.60 cm MITRAL VALVE MV Area (PHT): 3.53 cm     SHUNTS MV Decel Time: 215 msec     Systemic VTI:  0.30 m MR Peak grad: 170.0 mmHg    Systemic Diam: 1.90 cm MR Mean grad: 120.0 mmHg MR Vmax:      652.00 cm/s MR Vmean:     518.0 cm/s MV E velocity: 112.00 cm/s MV A velocity: 122.00 cm/s MV E/A ratio:  0.92 Kardie Tobb DO Electronically signed by Dub Huntsman DO Signature Date/Time: 06/03/2024/12:22:29 PM    Final      Assessment/Plan: NSTEMI Diagonal branch occlusion HIV+ Crack cocaine use Tobacco use  My great appreciation to [facility] and CV service.  Spoke with pt, she  will stop smoking (tobacco and crack) I will check her CD4 and HIV RNA today She has been adherent to ART, suspect she will be adherent to DAPT On high dose statin (lipitor  80mg ) now.  Will get her appt to see me in January 13 at 1:30pm  Reyes Fenton Internal Medicine Teaching Service/Infectious Disease Pager: [phone]  06/05/2024, 9:59 AM      "

## 2024-06-05 NOTE — Plan of Care (Signed)
" °  Problem: Clinical Measurements: Goal: Diagnostic test results will improve Outcome: Progressing Goal: Cardiovascular complication will be avoided Outcome: Progressing   Problem: Coping: Goal: Level of anxiety will decrease Outcome: Progressing   Problem: Health Behavior/Discharge Planning: Goal: Ability to safely manage health-related needs after discharge will improve Outcome: Progressing   "

## 2024-06-05 NOTE — Progress Notes (Unsigned)
 Enrolled patient for a 14 day Zio XT  monitor to be mailed to patients home

## 2024-06-05 NOTE — TOC Transition Note (Addendum)
 Transition of Care [facility]) - Discharge Note   Patient Details  Name: Lori [name] MRN: [id number] Date of Birth: 1970/08/31  Transition of Care [facility]) CM/SW Contact:  Waddell Barnie Rama, RN Phone Number: 06/05/2024, 12:24 PM   Clinical Narrative:    For dc , she has no needs. Eliquis  is zero dollar copay.         Patient Goals and CMS Choice            Discharge Placement                       Discharge Plan and Services Additional resources added to the After Visit Summary for                                       Social Drivers of Health (SDOH) Interventions SDOH Screenings   Food Insecurity: Food Insecurity Present (06/03/2024)  Housing: Low Risk (06/03/2024)  Transportation Needs: Unmet Transportation Needs (06/03/2024)  Utilities: Not At Risk (06/03/2024)  Alcohol Screen: High Risk (11/02/2022)  Depression (PHQ2-9): High Risk (09/20/2023)  Tobacco Use: High Risk (09/20/2023)     Readmission Risk Interventions    06/04/2024    4:12 PM 06/03/2024    9:44 AM  Readmission Risk Prevention Plan  Medication Screening Complete   Transportation Screening Complete Complete  PCP or Specialist Appt within 5-7 Days  Complete  Home Care Screening  Complete  Medication Review (RN CM)  Complete

## 2024-06-05 NOTE — Discharge Instructions (Signed)
Information on my medicine - ELIQUIS (apixaban)  This medication education was reviewed with me or my healthcare representative as part of my discharge preparation.  The pharmacist that spoke with me during my hospital stay was:  Einar Grad, [facility]  Why was Eliquis prescribed for you? Eliquis was prescribed for you to reduce the risk of a blood clot forming that can cause a stroke if you have a medical condition called atrial fibrillation (a type of irregular heartbeat).  What do You need to know about Eliquis ? Take your Eliquis TWICE DAILY - one tablet in the morning and one tablet in the evening with or without food. If you have difficulty swallowing the tablet whole please discuss with your pharmacist how to take the medication safely.  Take Eliquis exactly as prescribed by your doctor and DO NOT stop taking Eliquis without talking to the doctor who prescribed the medication.  Stopping may increase your risk of developing a stroke.  Refill your prescription before you run out.  After discharge, you should have regular check-up appointments with your healthcare provider that is prescribing your Eliquis.  In the future your dose may need to be changed if your kidney function or weight changes by a significant amount or as you get older.  What do you do if you miss a dose? If you miss a dose, take it as soon as you remember on the same day and resume taking twice daily.  Do not take more than one dose of ELIQUIS at the same time to make up a missed dose.  Important Safety Information A possible side effect of Eliquis is bleeding. You should call your healthcare provider right away if you experience any of the following: Bleeding from an injury or your nose that does not stop. Unusual colored urine (red or dark brown) or unusual colored stools (red or black). Unusual bruising for unknown reasons. A serious fall or if you hit your head (even if there is no bleeding).  Some  medicines may interact with Eliquis and might increase your risk of bleeding or clotting while on Eliquis. To help avoid this, consult your healthcare provider or pharmacist prior to using any new prescription or non-prescription medications, including herbals, vitamins, non-steroidal anti-inflammatory drugs (NSAIDs) and supplements.  This website has more information on Eliquis (apixaban): http://www.eliquis.com/eliquis/home

## 2024-06-05 NOTE — Telephone Encounter (Signed)
 Pharmacy Patient Advocate Encounter  Insurance verification completed.    The patient is insured through [facility]. Patient has [employer], may use a copay card, and/or apply for patient assistance if available.    Ran test claim for Eliquis  5mg  tablet and the current 30 day co-pay is $0.   This test claim was processed through [employer]- copay amounts may vary at other pharmacies due to [employer], or as the patient moves through the different stages of their insurance plan.

## 2024-06-06 LAB — T-HELPER CELLS (CD4) COUNT (NOT AT ARMC)
CD4 % Helper T Cell: 31 % — ABNORMAL LOW (ref 33–65)
CD4 T Cell Abs: 584 /uL (ref 400–1790)

## 2024-06-06 LAB — HIV-1 RNA QUANT-NO REFLEX-BLD
HIV 1 RNA Quant: <20 {copies}/mL
LOG10 HIV-1 RNA: UNDETERMINED {Log_copies}/mL

## 2024-06-12 ENCOUNTER — Other Ambulatory Visit: Payer: Self-pay

## 2024-06-12 ENCOUNTER — Other Ambulatory Visit (HOSPITAL_COMMUNITY): Payer: Self-pay

## 2024-06-12 ENCOUNTER — Other Ambulatory Visit: Payer: Self-pay | Admitting: Infectious Diseases

## 2024-06-12 DIAGNOSIS — B20 Human immunodeficiency virus [HIV] disease: Secondary | ICD-10-CM

## 2024-06-12 MED ORDER — BIKTARVY 50-200-25 MG PO TABS
1.0000 | ORAL_TABLET | Freq: Every day | ORAL | 1 refills | Status: AC
  Filled 2024-06-12 – 2024-06-13 (×2): qty 30, 30d supply, fill #0
  Filled 2024-07-13: qty 30, 30d supply, fill #1

## 2024-06-13 ENCOUNTER — Other Ambulatory Visit: Payer: Self-pay

## 2024-06-13 NOTE — Progress Notes (Signed)
 Specialty Pharmacy Refill Coordination Note  Lori [name] is a 53 y.o. female contacted today regarding refills of specialty medication(s) Bictegravir-Emtricitab-Tenofov (Biktarvy )   Patient requested Delivery   Delivery date: 06/18/24   Verified address: [address],[place] [address]   Medication will be filled on: 06/15/24

## 2024-06-14 ENCOUNTER — Encounter: Payer: Self-pay | Admitting: Infectious Diseases

## 2024-06-15 ENCOUNTER — Other Ambulatory Visit: Payer: Self-pay

## 2024-06-15 NOTE — Telephone Encounter (Signed)
 Called and spoke to pt's mother (pt not available) Let her know I have reached out to counselor for resources.

## 2024-06-18 ENCOUNTER — Other Ambulatory Visit: Payer: Self-pay

## 2024-06-18 NOTE — Progress Notes (Signed)
 Clinical Intervention Note  Clinical Intervention Notes: Patient reported being hospitalized for a heart attack and started on Eliquis , carvedilol , lisinopril , and rosuvastatin . Per [employer], carvedilol  may increase exposure of Biktarvy  causing an increase in side effects, however patient was started on a low dose. She also has a follow up scheduled with Dr. Eben in the next 2 weeks as a follow up. No DDIs identified with Biktarvy  and other new medications.   Clinical Intervention Outcomes: Prevention of an adverse drug event   Advertising Account Planner

## 2024-06-19 DIAGNOSIS — F1014 Alcohol abuse with alcohol-induced mood disorder: Secondary | ICD-10-CM

## 2024-06-19 DIAGNOSIS — F1410 Cocaine abuse, uncomplicated: Secondary | ICD-10-CM

## 2024-06-19 DIAGNOSIS — F1290 Cannabis use, unspecified, uncomplicated: Secondary | ICD-10-CM

## 2024-06-19 DIAGNOSIS — F3162 Bipolar disorder, current episode mixed, moderate: Secondary | ICD-10-CM

## 2024-06-26 ENCOUNTER — Other Ambulatory Visit: Payer: Self-pay

## 2024-06-26 ENCOUNTER — Ambulatory Visit (INDEPENDENT_AMBULATORY_CARE_PROVIDER_SITE_OTHER): Payer: Self-pay | Admitting: Infectious Diseases

## 2024-06-26 VITALS — BP 188/88 | HR 55 | Temp 98.0°F | Resp 32 | Ht 64.0 in | Wt 189.6 lb

## 2024-06-26 DIAGNOSIS — F319 Bipolar disorder, unspecified: Secondary | ICD-10-CM | POA: Diagnosis not present

## 2024-06-26 DIAGNOSIS — R911 Solitary pulmonary nodule: Secondary | ICD-10-CM

## 2024-06-26 DIAGNOSIS — I214 Non-ST elevation (NSTEMI) myocardial infarction: Secondary | ICD-10-CM | POA: Diagnosis not present

## 2024-06-26 DIAGNOSIS — F17210 Nicotine dependence, cigarettes, uncomplicated: Secondary | ICD-10-CM | POA: Diagnosis not present

## 2024-06-26 DIAGNOSIS — I10 Essential (primary) hypertension: Secondary | ICD-10-CM | POA: Diagnosis not present

## 2024-06-26 DIAGNOSIS — F17200 Nicotine dependence, unspecified, uncomplicated: Secondary | ICD-10-CM

## 2024-06-26 DIAGNOSIS — J019 Acute sinusitis, unspecified: Secondary | ICD-10-CM

## 2024-06-26 DIAGNOSIS — F1410 Cocaine abuse, uncomplicated: Secondary | ICD-10-CM | POA: Diagnosis not present

## 2024-06-26 DIAGNOSIS — B20 Human immunodeficiency virus [HIV] disease: Secondary | ICD-10-CM

## 2024-06-26 DIAGNOSIS — Z113 Encounter for screening for infections with a predominantly sexual mode of transmission: Secondary | ICD-10-CM

## 2024-06-26 DIAGNOSIS — F3170 Bipolar disorder, currently in remission, most recent episode unspecified: Secondary | ICD-10-CM

## 2024-06-26 MED ORDER — FLUTICASONE PROPIONATE 50 MCG/ACT NA SUSP
1.0000 | Freq: Every day | NASAL | 3 refills | Status: AC
  Filled 2024-06-26: qty 16, 60d supply, fill #0

## 2024-06-26 NOTE — Progress Notes (Signed)
 "  Subjective:    Patient ID: Lori [name], female  DOB: [birth year], 53 y.o.        MRN: [id number]   HPI 54 yo F with HIV+ (dx 2004), anxiety d/o, anal/vaginal warts, AIN1 (HPV+), tobacco use.   Previously on atripla (made her depressed suicidal).  tried odefsy but couldn't tolerate. Then changed to genvoya which she went off of . States it makes her throw up. At her f/u 11-2017, she was changed to biktarvy .     Son Lori [name], born 2004, HIV - Doing well, moved to [place]  .    She has been doing a lot of drugs.. .smoking crack She would like to be seen in [place] for her rectal prolapse.  Denies missed ART.   She was adm 12-20 to 06-05-24 with cocaine use and NSTEMI (peak trop 2200s). She had cath showing diagonal occlusion and partial LAD occlusion. She did not have stent.  She committed to alcohol and drug rehab in hospital.  Has been feeling ok- Had 1 episode of SOB and she felt it was from abd bloating from her coreg . Improved with air trapped, gas. No further episodes. No CP.  Takes some prn zyrtec, anti-histamines.  Has some anxiety- smokes occas marijuana.  Smoking cigarettes, crack once at beginning of month (made her sick- anxious). Decided not to go to rehab.  Disconnected from people who were using around her.   Still has uterine prolapse, waiting for surgery. Uses pull-ups.   Father has CVA, anemia (?).  Mom has colon cancer? F/u 3 years  Thinks she is going through menopause.  She has not had recent PAP HIV 1 RNA Quant (copies/mL)  Date Value  06/05/2024 <20  08/30/2023 <20  08/17/2021 30   CD4 T Cell Abs (/uL)  Date Value  06/05/2024 584  08/30/2023 434  05/17/2022 807     Health Maintenance  Topic Date Due   Medicare Annual Wellness (AWV)  Never done   Zoster Vaccines- Shingrix (1 of 2) Never done   Colonoscopy  Never done   Cervical Cancer Screening (Pap smear)  12/17/2020   Lung Cancer Screening  12/20/2020   Pneumococcal Vaccine: 50+  Years (4 of 4 - PCV20 or PCV21) 11/17/2022   Mammogram  08/29/2023   Influenza Vaccine  01/13/2024   COVID-19 Vaccine (5 - 2025-26 season) 02/13/2024   DTaP/Tdap/Td (2 - Td or Tdap) 11/17/2027   Hepatitis B Vaccines 19-59 Average Risk  Completed   Hepatitis C Screening  Completed   HIV Screening  Completed   HPV VACCINES  Aged Out   Meningococcal B Vaccine  Aged Out   Review of Systems  Constitutional:  Negative for chills, fever and weight loss.  HENT:  Positive for congestion.   Respiratory:  Negative for shortness of breath.   Cardiovascular:  Negative for chest pain and leg swelling.  Gastrointestinal:  Negative for constipation and diarrhea.  Genitourinary:  Negative for dysuria.  Psychiatric/Behavioral:  The patient is nervous/anxious.     Please see HPI. All other systems reviewed and negative.     Objective:  Physical Exam Vitals reviewed.  Constitutional:      Appearance: Normal appearance.  HENT:     Mouth/Throat:     Mouth: Mucous membranes are moist.     Pharynx: No oropharyngeal exudate.  Eyes:     Extraocular Movements: Extraocular movements intact.     Pupils: Pupils are equal, round, and reactive to light.  Cardiovascular:  Rate and Rhythm: Normal rate and regular rhythm.  Pulmonary:     Effort: Pulmonary effort is normal.     Breath sounds: Normal breath sounds.  Abdominal:     General: Bowel sounds are normal. There is no distension.     Palpations: Abdomen is soft.     Tenderness: There is no abdominal tenderness.  Musculoskeletal:     Cervical back: Normal range of motion and neck supple.  Neurological:     General: No focal deficit present.     Mental Status: She is alert.  Psychiatric:        Mood and Affect: Mood normal.        Speech: Speech is rapid and pressured (rapid.).            Assessment & Plan:  "

## 2024-06-26 NOTE — Assessment & Plan Note (Signed)
 Not present on 05-2024 CXR or 2019 CT scan.

## 2024-06-26 NOTE — Assessment & Plan Note (Signed)
 Encouraged her to f/u with [facility] Abstain from rec drugs Stop smoking.

## 2024-06-26 NOTE — Assessment & Plan Note (Signed)
 Will hold on adding anxiolytic.  Will try to get her into psych

## 2024-06-26 NOTE — Assessment & Plan Note (Signed)
 Taking her rx Her 2nd measurement is good, will watch and she will f/u at [facility].

## 2024-06-26 NOTE — Assessment & Plan Note (Signed)
 She is doing well Continue her biktarvy  She is on statin.  Will see her back in 4-6 months.  Labs prior

## 2024-06-26 NOTE — Assessment & Plan Note (Signed)
 Encouraged to quit Encouraged to f/u with counseling.

## 2024-06-26 NOTE — Assessment & Plan Note (Signed)
 Encouraged to quit.

## 2024-07-02 ENCOUNTER — Telehealth: Payer: Self-pay | Admitting: *Deleted

## 2024-07-02 NOTE — Telephone Encounter (Signed)
 Called patient Lori [name] with mother for patient to contact the breast center([phone]) to schedule her mammogram appointment/ mother stated she will give her the message as soon as she comes in.

## 2024-07-03 ENCOUNTER — Other Ambulatory Visit: Payer: Self-pay | Admitting: Infectious Diseases

## 2024-07-03 DIAGNOSIS — R911 Solitary pulmonary nodule: Secondary | ICD-10-CM

## 2024-07-03 DIAGNOSIS — Z79899 Other long term (current) drug therapy: Secondary | ICD-10-CM

## 2024-07-03 DIAGNOSIS — Z113 Encounter for screening for infections with a predominantly sexual mode of transmission: Secondary | ICD-10-CM

## 2024-07-03 DIAGNOSIS — K623 Rectal prolapse: Secondary | ICD-10-CM

## 2024-07-03 DIAGNOSIS — Z1231 Encounter for screening mammogram for malignant neoplasm of breast: Secondary | ICD-10-CM

## 2024-07-03 DIAGNOSIS — F1410 Cocaine abuse, uncomplicated: Secondary | ICD-10-CM

## 2024-07-03 DIAGNOSIS — F1014 Alcohol abuse with alcohol-induced mood disorder: Secondary | ICD-10-CM

## 2024-07-03 DIAGNOSIS — I10 Essential (primary) hypertension: Secondary | ICD-10-CM

## 2024-07-04 ENCOUNTER — Telehealth: Payer: Self-pay | Admitting: *Deleted

## 2024-07-04 NOTE — Telephone Encounter (Signed)
 Called patient LVM for patinet to return call to [phone] or call the breast center to schedule appointment. Left message with her mother 07/02/2024 to give Ramata message to call the breast center to set up her mammogram appointment -[phone].

## 2024-07-05 ENCOUNTER — Other Ambulatory Visit (HOSPITAL_COMMUNITY): Payer: Self-pay

## 2024-07-11 ENCOUNTER — Telehealth: Payer: Self-pay | Admitting: *Deleted

## 2024-07-11 NOTE — Telephone Encounter (Signed)
 Called patient left voice message for patient to call the breast center at [phone] to schedule her mammogram or she can call [facility] @ [phone]  and we can get that appointment scheduled for her. Voice message was left for patient on 07-04-2024 / left message with her mother on 07-02-2024 to contact the breast center at [phone] to schedule her mammogram.

## 2024-07-11 NOTE — Telephone Encounter (Signed)
 Mammogram appointment mailed to patient / appointment 07/27/2024 @ 1:30 pm to arrive 1:10 pm/ attached is the information regarding the 75.00 no show fee. Breast center [phone].

## 2024-07-13 ENCOUNTER — Other Ambulatory Visit (HOSPITAL_COMMUNITY): Payer: Self-pay

## 2024-07-17 ENCOUNTER — Other Ambulatory Visit: Payer: Self-pay

## 2024-07-18 ENCOUNTER — Other Ambulatory Visit: Payer: Self-pay

## 2024-07-18 NOTE — Progress Notes (Signed)
 Specialty Pharmacy Refill Coordination Note  Lori [name] is a 54 y.o. female contacted today regarding refills of specialty medication(s) Bictegravir-Emtricitab-Tenofov (Biktarvy )   Patient requested Delivery   Delivery date: 07/20/24   Verified address: [address],[place] [address]   Medication will be filled on: 07/19/24

## 2024-07-19 ENCOUNTER — Other Ambulatory Visit: Payer: Self-pay

## 2024-07-25 ENCOUNTER — Institutional Professional Consult (permissible substitution): Payer: Self-pay | Admitting: Licensed Clinical Social Worker

## 2024-07-27 ENCOUNTER — Ambulatory Visit
